# Patient Record
Sex: Female | Born: 1940
Health system: Southern US, Community
[De-identification: ages and names within clinical notes are randomized; demographics above are authoritative.]

## PROBLEM LIST (undated history)

## (undated) DIAGNOSIS — K219 Gastro-esophageal reflux disease without esophagitis: Secondary | ICD-10-CM

## (undated) DIAGNOSIS — R1310 Dysphagia, unspecified: Secondary | ICD-10-CM

## (undated) DIAGNOSIS — I10 Essential (primary) hypertension: Secondary | ICD-10-CM

## (undated) DIAGNOSIS — R7401 Elevation of levels of liver transaminase levels: Secondary | ICD-10-CM

## (undated) DIAGNOSIS — I341 Nonrheumatic mitral (valve) prolapse: Secondary | ICD-10-CM

## (undated) DIAGNOSIS — R740 Nonspecific elevation of levels of transaminase and lactic acid dehydrogenase [LDH]: Secondary | ICD-10-CM

## (undated) HISTORY — DX: Gastro-esophageal reflux disease without esophagitis: K21.9

## (undated) HISTORY — PX: UPPER GASTROINTESTINAL ENDOSCOPY: SHX188

## (undated) HISTORY — PX: COLONOSCOPY: SHX174

## (undated) HISTORY — PX: EYE SURGERY: SHX253

## (undated) HISTORY — DX: Elevation of levels of liver transaminase levels: R74.01

## (undated) HISTORY — DX: Nonrheumatic mitral (valve) prolapse: I34.1

## (undated) HISTORY — DX: Dysphagia, unspecified: R13.10

## (undated) HISTORY — DX: Nonspecific elevation of levels of transaminase and lactic acid dehydrogenase (ldh): R74.0

---

## 1982-10-23 HISTORY — PX: LAPAROSCOPIC TUBAL LIGATION: SHX1937

## 2002-04-30 ENCOUNTER — Emergency Department (HOSPITAL_COMMUNITY): Admission: EM | Admit: 2002-04-30 | Discharge: 2002-04-30 | Payer: Self-pay | Admitting: Internal Medicine

## 2002-04-30 ENCOUNTER — Encounter: Payer: Self-pay | Admitting: Internal Medicine

## 2004-01-21 ENCOUNTER — Ambulatory Visit (HOSPITAL_COMMUNITY): Admission: RE | Admit: 2004-01-21 | Discharge: 2004-01-21 | Payer: Self-pay | Admitting: Pulmonary Disease

## 2004-03-22 ENCOUNTER — Ambulatory Visit (HOSPITAL_COMMUNITY): Admission: RE | Admit: 2004-03-22 | Discharge: 2004-03-22 | Payer: Self-pay | Admitting: Internal Medicine

## 2004-06-15 ENCOUNTER — Ambulatory Visit (HOSPITAL_COMMUNITY): Admission: RE | Admit: 2004-06-15 | Discharge: 2004-06-15 | Payer: Self-pay | Admitting: Pulmonary Disease

## 2004-06-29 ENCOUNTER — Ambulatory Visit (HOSPITAL_COMMUNITY): Admission: RE | Admit: 2004-06-29 | Discharge: 2004-06-29 | Payer: Self-pay | Admitting: Internal Medicine

## 2005-04-05 ENCOUNTER — Ambulatory Visit: Payer: Self-pay | Admitting: *Deleted

## 2005-05-25 ENCOUNTER — Ambulatory Visit: Payer: Self-pay | Admitting: Internal Medicine

## 2006-04-09 ENCOUNTER — Ambulatory Visit: Payer: Self-pay | Admitting: Internal Medicine

## 2006-04-11 ENCOUNTER — Ambulatory Visit (HOSPITAL_COMMUNITY): Admission: RE | Admit: 2006-04-11 | Discharge: 2006-04-11 | Payer: Self-pay | Admitting: Otolaryngology

## 2006-04-18 ENCOUNTER — Ambulatory Visit (HOSPITAL_COMMUNITY): Admission: RE | Admit: 2006-04-18 | Discharge: 2006-04-18 | Payer: Self-pay | Admitting: Otolaryngology

## 2006-04-18 ENCOUNTER — Encounter (INDEPENDENT_AMBULATORY_CARE_PROVIDER_SITE_OTHER): Payer: Self-pay | Admitting: *Deleted

## 2010-03-07 ENCOUNTER — Ambulatory Visit (HOSPITAL_COMMUNITY): Admission: RE | Admit: 2010-03-07 | Discharge: 2010-03-07 | Payer: Self-pay | Admitting: Pulmonary Disease

## 2010-06-09 ENCOUNTER — Ambulatory Visit (HOSPITAL_COMMUNITY): Admission: RE | Admit: 2010-06-09 | Discharge: 2010-06-09 | Payer: Self-pay | Admitting: Pulmonary Disease

## 2010-09-02 ENCOUNTER — Encounter (HOSPITAL_COMMUNITY)
Admission: RE | Admit: 2010-09-02 | Discharge: 2010-10-02 | Payer: Self-pay | Source: Home / Self Care | Attending: Pulmonary Disease | Admitting: Pulmonary Disease

## 2010-09-06 ENCOUNTER — Ambulatory Visit: Payer: Self-pay | Admitting: Internal Medicine

## 2010-10-11 ENCOUNTER — Ambulatory Visit: Payer: Self-pay | Admitting: Internal Medicine

## 2011-03-10 NOTE — Op Note (Signed)
NAMESHINITA, MAC              ACCOUNT NO.:  [id number]   MEDICAL RECORD NO.:  [id number]          PATIENT TYPE:  AMB   LOCATION:  SDS                          FACILITY:  [facility]   PHYSICIAN:  Suzanna Obey, M.D.       DATE OF BIRTH:  1941/07/24   DATE OF PROCEDURE:  04/18/2006  DATE OF DISCHARGE:                                 OPERATIVE REPORT   PREOPERATIVE DIAGNOSIS:  Chronic sinusitis.   POSTOPERATIVE DIAGNOSIS:  Chronic sinusitis.   SURGICAL PROCEDURE:  Bilateral maxillary antrostomy, bilateral  ethmoidectomy, bilateral sphenoidotomy, bilateral frontal sinusotomy and  Stealth computer guidance.   SURGEON:  Suzanna Obey, M.D.   ANESTHESIA:  General.   ESTIMATED BLOOD LOSS:  Approximately 50 mL.   INDICATIONS:  This is a 70 year old who has had chronic sinus problems with  nasal polyposis and has had a significant problem with asthma as well as  sinus disease.  She is completely obstructed on the CT scan.  She was  informed of the risks and benefits of the procedure including bleeding,  infection, scarring, recurrence of the polyps, CSF leak, change in the sense  of smell, blindness, chronic crusting and drying, and risks of the  anesthetic.  All questions are answered and consent was obtained.   DESCRIPTION OF PROCEDURE:  The patient was taken to the operating room and  placed in the supine position and after adequate general endotracheal tube  anesthesia, she was placed in the supine position.  The Stealth guidance  helmet was positioned and calibrated.  Oxymetazoline pledgets were placed  into the nose and the polyps were injected with 1% lidocaine with 1:100,000  epinephrine.  The left side was begun with the microdebrider.  Using Stealth  guidance, the significant polyp material was removed -- it was filling the  nose -- and then dissection was carried into the sinus cavity, where the  antrostomy was identified and opened widely with the upbiting microdebrider.  Stealth guidance was used.  The dissection was carried from posterior to  anterior and the polyps were very soft, so no bone was removed to be  removed.  It was carefully dissected along the fovea up into the nasofrontal  duct, where thick mucus was suctioned out of the frontal sinus.  Once  debriding all this soft polyp material out, were nicely open sinuses.  The  oxymetazoline pledgets were placed.  The right side was repeated in the same  fashion.  Again, antrostomy opened, thick polypoid material within it and  polyps throughout the entire sinus cavities.  Again, thick mucus was in the  frontal sinus, which was opened.  Stealth guidance was used throughout.  The  blood was suctioned out and oxymetazoline pledgets were placed.  The Kennedy  packs soaked in Bacitracin  were then placed into the nose and injected with saline.  These were loosely  tied across the columella.  The nasopharynx was suctioned out of all blood  and debris.  The patient was awakened and brought to Recovery in stable  condition.  Counts correct.  ______________________________  Suzanna Obey, M.D.     JB/MEDQ  D:  04/18/2006  T:  04/18/2006  Job:  [id number]   cc:   Ramon Dredge L. Juanetta Gosling, M.D.  Fax: [phone]

## 2011-03-10 NOTE — Op Note (Signed)
NAME:  DESMA, WILKOWSKI                        ACCOUNT NO.:  [id number]   MEDICAL RECORD NO.:  [id number]                   PATIENT TYPE:  AMB   LOCATION:  DAY                                  FACILITY:  [facility]   PHYSICIAN:  Lionel December, M.D.                 DATE OF BIRTH:  08/29/1941   DATE OF PROCEDURE:  06/29/2004  DATE OF DISCHARGE:                                 OPERATIVE REPORT   PROCEDURE:  Total colonoscopy.   INDICATIONS:  Thecla is a 70 year old Caucasian female who is here primarily  for screening exam. This is prompted by change in bowel habits, her  constipation resulting from verapamil which has been discontinued, and she  is back to having bowel movement daily or every other day.  Family history  is negative for colorectal carcinoma. Procedure and risks were reviewed with  the patient and informed consent was obtained.   PREOPERATIVE MEDICATIONS:  Demerol 50 mg IV, Versed 6 mg IV in divided  doses.   FINDINGS:  Procedure performed in endoscopy suite. The patient's vital signs  and O2 saturations were monitored during procedure and remained stable. The  patient was placed in the left lateral position and rectal examination  performed. No abnormality noted on external or digital exam. Olympus video  scope was placed in the rectum and advanced into sigmoid colon and beyond.  She had few scattered diverticula, mainly at sigmoid colon and few more  proximal to that. She had one small diverticulum at the cecum. Preparation  was satisfactory although she had a lot of air bubbles and required vigorous  washing. Scope was advanced to the cecum.  Cecal landmarks, i.e. appendiceal orifice and ileocecal valve, were  photographed for the record. As the scope was withdrawn, colonic mucosa was  once again carefully examined and was normal throughout. There were no  polyps and/or tumor masses. The rectal mucosa similarly was normal. Scope  was retroflexed to examine anorectal  junction, and small hemorrhoids were  noted below the dentate line. She also had fine mucosal pigmentation to  proximal colon consistent with melanosis coli. The scope was withdrawn. The  patient tolerated the procedure well.   FINAL DIAGNOSES:  1.  Pan colic diverticulosis. She has a few scattered diverticula mainly at      the sigmoid colon and some proximal to splenic flexure.  2.  Small external hemorrhoids.  3.  Mild melanosis coli.   RECOMMENDATIONS:  High-fiber diet plus Citrucel 1 tablespoonful daily.      ___________________________________________                                            Lionel December, M.D.   NR/MEDQ  D:  06/29/2004  T:  06/29/2004  Job:  [id number]   cc:   Ramon Dredge  Emilie Rutter, M.D.  [place]  [place] [address]  Fax: [phone]

## 2011-03-10 NOTE — Consult Note (Signed)
NAME:  Stephanie West, Stephanie West                        ACCOUNT NO.:  [id number]   MEDICAL RECORD NO.:  [id number]                  PATIENT TYPE:   LOCATION:                                       FACILITY:   PHYSICIAN:  Lionel December, M.D.                 DATE OF BIRTH:  1940/11/08   DATE OF CONSULTATION:  03/16/2004  DATE OF DISCHARGE:                                   CONSULTATION   REASON FOR CONSULTATION:  Dysphagia.   HISTORY OF PRESENT ILLNESS:  Stephanie West is a 70 year old Caucasian female who is  referred through the courtesy of Dr. Juanetta Gosling for further evaluation of  dysphagia, which has been going on for four years.  The patient says that  she was busy helping her mother with Alzheimer's dementia and therefore  could not come any earlier.  She has intermittent episodes.  She does not  have any difficulty with liquids.  She feels that food gets stuck at  midsternal level.  She has had multiple episodes where she had to  regurgitate in order to get relief.  She has been on Fosamax for  osteoporosis.  She stopped this two months ago.  She has also been on  Aciphex and she has noted some improvement, or at least has been suffering  less frequently.  She says that she chews the food thoroughly.  She denies  chest pain, odynophagia, heartburn, hoarseness, chronic cough or sore  throat.  She also denies abdominal pain, melena or rectal bleeding.  Her  bowels move daily.  Her weight has been stable and she has very good  appetite.  She tells me that Dr. Juanetta Gosling and her husband have asked her to  undergo colonoscopy, but she has been nervous, but she wants to have it this  summer.   Stephanie West had EGD and EG back in September 1988 when she presented with  epigastric pain, dysphagia as well as chest discomfort.  She had mild  changes of reflux esophagitis, but no ring or stricture was noted.  Her  esophagus was empirically dilated at that time with relief of her dysphagia.   She had a second EGD in  March 1993 when she was hospitalized with nausea and  vomiting of five weeks duration and had a 14 pound weight loss.  This EGD  was normal.  She had further evaluation while hospitalized, but I do not  have those records available.   She is on:  1. Advair Discus 500/50 b.i.d.  2. Theo-Dur 200 mg q.d.  3. Aciphex 20 mg b.i.d.  4. Xanax 1 mg q.h.s.  5. Kay Ciel 10 mEq b.i.d.  6. Nasonex spray q.d.  7. Albuterol inhaler p.r.n.  8. Fosamax was discontinued about eight weeks ago.   PAST MEDICAL HISTORY:  1. She has had asthma since age 35.  She has not had any problems since she     has been on  Advair Discus.  She does not even remember the last time she     used her albuterol inhaler.  2. She has been hypertensive for 20 years.  3. She has osteoporosis.  4. She has had three sinus surgeries.  5. She has had bilateral tubal ligation.  6. Her prior EGDs are reviewed, as above.   ALLERGIES:  PENICILLIN, SULFA and ASA; all causing asthmatic attack.   FAMILY HISTORY:  Mother died of Alzheimer's dementia at age 61; father of  prostate cancer at age 31; she does not have any siblings.   SOCIAL HISTORY:  She is married and she has four healthy children.  She is a  retired Hospital doctor.  She has never smoked cigarettes and does  not drink alcohol.   PHYSICAL EXAMINATION:  GENERAL:  This is a pleasant, well-developed, well-  nourished Caucasian female who is in no acute distress.  VITAL SIGNS:  She weighs 156 pounds; she is 5 feet 4 inches tall; pulse 82;  blood pressure 164/80.  HEENT:  Conjunctivae is pink.  Sclerae is nonicteric.  Oropharyngeal mucosa  is normal.  Dentition in satisfactory condition.  NECK:  Without masses or thyromegaly.  CARDIAC:  Regular rhythm.  Normal S1 and S3.  No murmur or gallop noted.  LUNGS:  Clear to auscultation.  ABDOMEN:  All bowel sounds are normal.  Palpation reveals soft abdomen  without tenderness, organomegaly or masses.  RECTAL:   Deferred.  EXTREMITIES:  She does not have clubbing or peripheral edema.   I also reviewed her laboratory studies from January 21, 2004.  Her CBC is  normal.  Her LFTs are also within normal limits.   ASSESSMENT:  Stephanie West is a 78 year old Caucasian female who presents with a  four year history of intermittent solid food dysphagia, which had been  gradually getting worse until she went on proton pump inhibitor.  She does  not have typical symptoms for gastroesophageal reflux disease.  She has been  on Fosamax.  It is very likely that she has developed an esophageal  stricture secondary to this therapy.  She needs to be further evaluated with  esophagogastroduodenoscopy and will possibly need EG.   RECOMMENDATIONS:  1. She will continue antireflux measures as before, but drop Aciphex dose to     20 mg p.o. q.a.m.  2. Esophagogastroduodenoscopy and possible esophageal dilatation to be     performed at [facility] in the near future.  I     have reviewed the procedure and risks with the patient and she is     agreeable.  3. She will be scheduled for screening colonoscopy some time in July or     August.   We would like to thank Dr. Juanetta Gosling for the opportunity to participate in the  care of this nice lady.      ___________________________________________                                            Lionel December, M.D.   NR/MEDQ  D:  03/16/2004  T:  03/17/2004  Job:  [id number]   cc:   Ramon Dredge L. Juanetta Gosling, M.D.  [address]  [place]  [place] [address]  Fax: [phone]

## 2011-03-10 NOTE — Op Note (Signed)
NAME:  Stephanie West, Stephanie West                        ACCOUNT NO.:  [id number]   MEDICAL RECORD NO.:  [id number]                   PATIENT TYPE:  AMB   LOCATION:  DAY                                  FACILITY:  [facility]   PHYSICIAN:  Lionel December, M.D.                 DATE OF BIRTH:  03-Jun-1941   DATE OF PROCEDURE:  03/22/2004  DATE OF DISCHARGE:                                 OPERATIVE REPORT   PROCEDURE:  Esophagogastroduodenoscopy with esophageal dilatation.   INDICATIONS:  Jajaira is a 70 year old Caucasian female who presents with  solid food dysphagia the patient has been experiencing intermittently over  the last four years.  She was on Fosamax, which was discontinued just over a  month ago.  She did have her esophagus dilated back in 1998, but she did not  have any ring or stricture in it.  It seemed to help her dysphagia.  She is  undergoing diagnostic and therapeutic procedure.  The procedure risks were  reviewed with the patient.  Informed consent was obtained.   PREMEDICATION:  Cetacaine spray for pharyngeal topical anesthesia, Demerol  50 mg IV, Versed 6 mg IV in divided dose.   FINDINGS:  Procedure performed in endoscopy suite.  Patient's vital signs  and O2 saturation were monitored during procedure and remained stable.  The  patient was placed in the left lateral recumbent position and the Olympus  video scope was passed via oropharynx into the laryngopharynx.  Some  difficulty encountered in passing the scope into esophagus, felt to be due  to esophageal web.   Esophagus:  Mucosa and body of the esophagus normal.  There was a Schatzki's  ring at GE junction, which was located at 34 cm.  Hiatus was at 39.  She was  felt to have a small to moderate-sized sliding hiatal hernia.   Stomach:  It was empty and distended very well with insufflation.  Folds of  the proximal stomach were normal.  Examination of the mucosa revealed linear  streaks of erythema at antrum and there was a  3 mm polyp at the fundus,  which was ablated via cold biopsy.   Duodenum:  Examination of the bulb and second part of the duodenum was  normal.  The endoscope was withdrawn.   The esophagus was dilated by passing a 54 [place] Maloney dilator to full  insertion.  As the dilator was withdrawn, endoscope was passed again and  there was a __________ tear of the cervical esophagus, which was felt to be  the site of web, and another mucosal tear at GE junction, disrupting the  ring.  The endoscope was withdrawn.  The endoscope was withdrawn.  The  patient tolerated the procedure well.   FINAL DIAGNOSES:  1. Esophageal web and Schatzki's ring, both of which were disrupted by     passing 54 [place] Maloney dilator.  2. Small to moderate-sized sliding hiatal  hernia.  3. Nonerosive antral gastritis.  4. A 3 mm fundal polyp that was ablated by cold biopsy.   RECOMMENDATIONS:  1. She will continue Aciphex at 20 mg p.o. q.a.m.  2. She will call with a progress report later this week.  3. I would like for her to hold off her Fosamax until I have talked with her     later this week or next.      ___________________________________________                                            Lionel December, M.D.   NR/MEDQ  D:  03/22/2004  T:  03/22/2004  Job:  [id number]   cc:   Ramon Dredge L. Juanetta Gosling, M.D.  [address]  [place]  [place] [address]  Fax: [phone]

## 2011-03-14 ENCOUNTER — Ambulatory Visit (HOSPITAL_COMMUNITY)
Admission: RE | Admit: 2011-03-14 | Discharge: 2011-03-14 | Disposition: A | Discharge status: Home / Self Care | Payer: Medicare Other | Source: Ambulatory Visit | Attending: Pulmonary Disease | Admitting: Pulmonary Disease

## 2011-03-14 ENCOUNTER — Encounter (HOSPITAL_COMMUNITY): Payer: Self-pay | Admitting: Radiology

## 2011-03-14 ENCOUNTER — Inpatient Hospital Stay (HOSPITAL_COMMUNITY)
Admission: RE | Admit: 2011-03-14 | Discharge: 2011-03-20 | DRG: 195 | Disposition: A | Discharge status: Home / Self Care | Payer: Medicare Other | Source: Ambulatory Visit | Attending: Pulmonary Disease | Admitting: Pulmonary Disease

## 2011-03-14 ENCOUNTER — Inpatient Hospital Stay (HOSPITAL_COMMUNITY): Payer: Medicare Other

## 2011-03-14 ENCOUNTER — Other Ambulatory Visit (HOSPITAL_COMMUNITY): Payer: Self-pay | Admitting: Pulmonary Disease

## 2011-03-14 DIAGNOSIS — Z880 Allergy status to penicillin: Secondary | ICD-10-CM

## 2011-03-14 DIAGNOSIS — J4489 Other specified chronic obstructive pulmonary disease: Secondary | ICD-10-CM | POA: Diagnosis present

## 2011-03-14 DIAGNOSIS — I10 Essential (primary) hypertension: Secondary | ICD-10-CM | POA: Diagnosis present

## 2011-03-14 DIAGNOSIS — R059 Cough, unspecified: Secondary | ICD-10-CM | POA: Insufficient documentation

## 2011-03-14 DIAGNOSIS — R0789 Other chest pain: Secondary | ICD-10-CM | POA: Insufficient documentation

## 2011-03-14 DIAGNOSIS — M810 Age-related osteoporosis without current pathological fracture: Secondary | ICD-10-CM | POA: Diagnosis present

## 2011-03-14 DIAGNOSIS — R05 Cough: Secondary | ICD-10-CM

## 2011-03-14 DIAGNOSIS — F3289 Other specified depressive episodes: Secondary | ICD-10-CM | POA: Diagnosis present

## 2011-03-14 DIAGNOSIS — J329 Chronic sinusitis, unspecified: Secondary | ICD-10-CM | POA: Diagnosis present

## 2011-03-14 DIAGNOSIS — R918 Other nonspecific abnormal finding of lung field: Secondary | ICD-10-CM | POA: Insufficient documentation

## 2011-03-14 DIAGNOSIS — J449 Chronic obstructive pulmonary disease, unspecified: Secondary | ICD-10-CM | POA: Diagnosis present

## 2011-03-14 DIAGNOSIS — K219 Gastro-esophageal reflux disease without esophagitis: Secondary | ICD-10-CM | POA: Diagnosis present

## 2011-03-14 DIAGNOSIS — F411 Generalized anxiety disorder: Secondary | ICD-10-CM | POA: Diagnosis present

## 2011-03-14 DIAGNOSIS — R0602 Shortness of breath: Secondary | ICD-10-CM

## 2011-03-14 DIAGNOSIS — J189 Pneumonia, unspecified organism: Principal | ICD-10-CM | POA: Diagnosis present

## 2011-03-14 DIAGNOSIS — F329 Major depressive disorder, single episode, unspecified: Secondary | ICD-10-CM | POA: Diagnosis present

## 2011-03-14 HISTORY — DX: Essential (primary) hypertension: I10

## 2011-03-14 LAB — COMPREHENSIVE METABOLIC PANEL
ALT: 19 U/L (ref 0–35)
Albumin: 3.1 g/dL — ABNORMAL LOW (ref 3.5–5.2)
GFR calc non Af Amer: 57 mL/min — ABNORMAL LOW (ref >60)

## 2011-03-14 LAB — CBC
HCT: 38.6 % (ref 36.0–46.0)
MCH: 33.2 pg (ref 26.0–34.0)
MCV: 97.2 fL (ref 78.0–100.0)
Platelets: 179 10*3/uL (ref 150–400)
RDW: 12.3 % (ref 11.5–15.5)

## 2011-03-14 LAB — INFLUENZA PANEL BY PCR (TYPE A & B): H1N1 flu by pcr: NOT DETECTED

## 2011-03-14 LAB — DIFFERENTIAL
Basophils Relative: 0 % (ref 0–1)
Eosinophils Relative: 0 % (ref 0–5)
Lymphocytes Relative: 13 % (ref 12–46)
Lymphs Abs: 2.6 10*3/uL (ref 0.7–4.0)
Monocytes Absolute: 1.4 10*3/uL — ABNORMAL HIGH (ref 0.1–1.0)
Neutro Abs: 16 10*3/uL — ABNORMAL HIGH (ref 1.7–7.7)

## 2011-03-14 LAB — D-DIMER, QUANTITATIVE: D-Dimer, Quant: 1.12 ug/mL-FEU — ABNORMAL HIGH (ref 0.00–0.48)

## 2011-03-14 MED ORDER — IOHEXOL 350 MG/ML SOLN
100.0000 mL | Freq: Once | INTRAVENOUS | Status: AC | PRN
  Administered 2011-03-14: 100 mL via INTRAVENOUS

## 2011-03-15 LAB — BASIC METABOLIC PANEL
Chloride: 102 mEq/L (ref 96–112)
Creatinine, Ser: 0.81 mg/dL (ref 0.4–1.2)
GFR calc Af Amer: >60 mL/min (ref >60)
Glucose, Bld: 81 mg/dL (ref 70–99)
Potassium: 3.6 mEq/L (ref 3.5–5.1)
Sodium: 136 mEq/L (ref 135–145)

## 2011-03-15 LAB — URINE MICROSCOPIC-ADD ON

## 2011-03-15 LAB — URINALYSIS, ROUTINE W REFLEX MICROSCOPIC
Leukocytes, UA: NEGATIVE
Nitrite: POSITIVE — AB
Urobilinogen, UA: 0.2 mg/dL (ref 0.0–1.0)
pH: 5.5 (ref 5.0–8.0)

## 2011-03-15 LAB — DIFFERENTIAL
Basophils Relative: 0 % (ref 0–1)
Eosinophils Absolute: 0.1 10*3/uL (ref 0.0–0.7)
Eosinophils Relative: 1 % (ref 0–5)
Lymphs Abs: 2.5 10*3/uL (ref 0.7–4.0)
Monocytes Absolute: 1 10*3/uL (ref 0.1–1.0)
Monocytes Relative: 8 % (ref 3–12)
Neutro Abs: 9.6 10*3/uL — ABNORMAL HIGH (ref 1.7–7.7)
Neutrophils Relative %: 72 % (ref 43–77)

## 2011-03-15 LAB — CBC
HCT: 36.6 % (ref 36.0–46.0)
MCH: 32.3 pg (ref 26.0–34.0)
MCV: 98.7 fL (ref 78.0–100.0)
RBC: 3.71 MIL/uL — ABNORMAL LOW (ref 3.87–5.11)
WBC: 13.2 10*3/uL — ABNORMAL HIGH (ref 4.0–10.5)

## 2011-03-15 LAB — GENTAMICIN LEVEL, RANDOM: Gentamicin Rm: 5.7 ug/mL

## 2011-03-16 LAB — URINE CULTURE: Culture  Setup Time: 201205231045

## 2011-03-16 NOTE — Group Therapy Note (Signed)
  Stephanie West, SCHLACHTER              ACCOUNT NO.:  [id number]  MEDICAL RECORD NO.:  [id number]           PATIENT TYPE:  I  LOCATION:  A308                          FACILITY:  [facility]  PHYSICIAN:  Emmalea Treanor L. Juanetta Gosling, M.D.DATE OF BIRTH:  [birth year]  DATE OF PROCEDURE:  03/15/2011 DATE OF DISCHARGE:                                PROGRESS NOTE   Stephanie West is a 70 year old who was admitted with pneumonia.  This is a community-acquired pneumonia and she is being treated for that.  She says she feels okay and has no new complaints except she did not get her AcipHex last night, actually got her on Protonix and she did not get her blood pressure medication last night.  She is feeling a little bit better.  She is less congested.  She is a little more able to take a deep breath.  Her exam shows that her temperature is 98.1, pulse is 90, respirations 20, blood pressure 145/81, and O2 sats 88% on room air. Her chest is clear without wheezes.  Her heart is regular.  Her abdomen is soft.  Her extremities showed no edema.  White blood count shows 13,200 which is down substantially.  She had a CT of the chest done because of an elevated D-dimer and it did not show pulmonary emboli.  My assessment then she is improving.  Plan is to continue her with current treatments, medications, IV antibiotics, etc.  We will get the med rec and get her started back on her blood pressure medication, get her on the Protonix, continue with everything else and follow.     Abdalla Naramore L. Juanetta Gosling, M.D.     ELH/MEDQ  D:  03/15/2011  T:  03/15/2011  Job:  [id number]  Electronically Signed by Kari Baars M.D. on 03/16/2011 08:43:57 AM

## 2011-03-16 NOTE — H&P (Signed)
  NAMEMADY, Stephanie West              ACCOUNT NO.:  [id number]  MEDICAL RECORD NO.:  [id number]           PATIENT TYPE:  I  LOCATION:  A308                          FACILITY:  [facility]  PHYSICIAN:  Jonika Critz L. Juanetta Gosling, M.D.DATE OF BIRTH:  1941/07/21  DATE OF ADMISSION:  03/14/2011 DATE OF DISCHARGE:  LH                             HISTORY & PHYSICAL   REASON FOR ADMISSION:  Pneumonia.  HISTORY:  Stephanie West is a 70 year old who came to my office complaining of chest discomfort, shortness of breath, and hemoptysis. She had fever as well.  I had her get a chest x-ray and this was able to found that she had pneumonia and unfortunately has severe problems with asthma and she is very short of breath, and not able to take a deep breath while cough anything up.  She is going to be admitted to the hospital.  Her past medical history is positive for GERD, asthma, chronic sinus infection, and she had some problems with elevated liver function testand this has improved.  She has hyperlipidemia, anxiety, depression. Her other history shows asthma, she had sinus surgery.  She has had a tubal ligation.  She has had osteoporosis.  FAMILY HISTORY:  Shows that her father died of prostate cancer.  Mother had dementia.  SOCIAL HISTORY:  She is married, lives at home with her husband.  She does not smoke.  She does not use any alcohol.  She has been volunteering at [employer] which is of some importance because of an outbreak of influenza there.  REVIEW OF SYSTEMS:  Except as mentioned is negative.  PHYSICAL EXAMINATION:  GENERAL:  She appears to be uncomfortable. VITAL SIGNS:  Her temperature is 101.1. HEENT:  Her pupils are reactive.  Nose and throat are clear.  Mucous membranes are moist. NECK:  Supple without masses. CHEST:  Relatively clear with some rhonchi, but she is splinting on the right side. HEART:  Regular without murmur, gallop, or rub. ABDOMEN:  Soft with no masses.   Bowel sounds are present and active. EXTREMITIES:  Showed no edema. CENTRAL NERVOUS SYSTEM:  Grossly intact.  Assessment is that she has a pneumonia by chest x-ray clinically that fits her picture.  It is possible that she had some sort of problem something like influenza as well, but at this point, I am going to check a influenza PCR, continue with everything else, continue all of her other treatments and have her get lab work, blood cultures, etc.  She also thinks she may have a urinary tract infection, so I am going to have her get a urine culture as well.     Hiroto Saltzman L. Juanetta Gosling, M.D.     ELH/MEDQ  D:  03/14/2011  T:  03/15/2011  Job:  [id number]  Electronically Signed by Kari Baars M.D. on 03/16/2011 08:43:54 AM

## 2011-03-17 NOTE — Group Therapy Note (Signed)
  NAMEMURDIS, [name]              ACCOUNT NO.:  [id number]  MEDICAL RECORD NO.:  [id number]           PATIENT TYPE:  I  LOCATION:  A308                          FACILITY:  [facility]  PHYSICIAN:  Jayda White L. Juanetta Gosling, M.D.DATE OF BIRTH:  October 09, 1941  DATE OF PROCEDURE: DATE OF DISCHARGE:                                PROGRESS NOTE   [name] is admitted with pneumonia.  She also has asthma/COPD, anxiety, depression, and hypertension.  She is much improved this morning, says she still has some pain in the right side of her chest. Her exam shows that her temperature is 98.2, pulse 88, respirations 18, blood pressure 122/76, O2 sats 93% on room air.  Her chest is clear. She looks much better.  Her heart is regular.  Blood cultures thus far are negative and my assessment is that she is improving.  Plan is to continue with current treatments IV antibiotics etc.  We discussed what she wants to do depending on how she improves.  She is on antibiotics that do not have a good equivalent p.o. and I wanted to get at least 5 days IV.  She is not particularly interested in doing a PICC line and IV antibiotics at home.  We will plan to continue with treatments, get at least 5 days IV in and then decide what to do from there depending on her clinical situation.     Bright Spielmann L. Juanetta Gosling, M.D.     ELH/MEDQ  D:  03/16/2011  T:  03/16/2011  Job:  [id number]  Electronically Signed by Kari Baars M.D. on 03/17/2011 03:44:19 PM

## 2011-03-18 LAB — BASIC METABOLIC PANEL
BUN: 6 mg/dL (ref 6–23)
CO2: 27 mEq/L (ref 19–32)
Calcium: 9 mg/dL (ref 8.4–10.5)
Creatinine, Ser: 0.53 mg/dL (ref 0.4–1.2)
GFR calc non Af Amer: >60 mL/min (ref >60)
Potassium: 3.7 mEq/L (ref 3.5–5.1)

## 2011-03-18 LAB — DIFFERENTIAL
Basophils Absolute: 0 10*3/uL (ref 0.0–0.1)
Lymphocytes Relative: 18 % (ref 12–46)
Neutro Abs: 4.2 10*3/uL (ref 1.7–7.7)

## 2011-03-18 LAB — CBC
HCT: 33.7 % — ABNORMAL LOW (ref 36.0–46.0)
MCH: 32.4 pg (ref 26.0–34.0)
MCHC: 33.8 g/dL (ref 30.0–36.0)
RBC: 3.52 MIL/uL — ABNORMAL LOW (ref 3.87–5.11)

## 2011-03-19 LAB — CULTURE, BLOOD (ROUTINE X 2)
Culture: NO GROWTH
Culture: NO GROWTH

## 2011-03-22 NOTE — Group Therapy Note (Signed)
  Stephanie West, Stephanie West              ACCOUNT NO.:  [id number]  MEDICAL RECORD NO.:  [id number]           PATIENT TYPE:  I  LOCATION:  A308                          FACILITY:  [facility]  PHYSICIAN:  Yetunde Leis L. Juanetta Gosling, M.D.DATE OF BIRTH:  Mar 15, 1941  DATE OF PROCEDURE: DATE OF DISCHARGE:                                PROGRESS NOTE   [name] is admitted with pneumonia.  She has asthma/COPD and she says she is doing better.  She has no new complaints.  She is still weak and she did have some wheezing last night.  PHYSICAL EXAMINATION:  GENERAL:  Shows that she is awake and alert. CHEST:  Clear. HEART:  Regular. ABDOMEN:  Soft.  Assessment is that she has pneumonia.  She has asthma which is pretty stable.  She has hypertension.  PLAN:  Because she has had some wheezes, I am going to start her on some Solu-Medrol now, continue with her antibiotics and follow.     Isiac Breighner L. Juanetta Gosling, M.D.     ELH/MEDQ  D:  03/17/2011  T:  03/18/2011  Job:  [id number]  Electronically Signed by Kari Baars M.D. on 03/22/2011 08:26:52 AM

## 2011-03-22 NOTE — Group Therapy Note (Signed)
  Stephanie West, GARTLEY              ACCOUNT NO.:  [id number]  MEDICAL RECORD NO.:  [id number]           PATIENT TYPE:  I  LOCATION:  A308                          FACILITY:  [facility]  PHYSICIAN:  Aalyssa Elderkin L. Juanetta Gosling, M.D.DATE OF BIRTH:  11-Jun-1941  DATE OF PROCEDURE:  03/20/2011 DATE OF DISCHARGE:  03/20/2011                                PROGRESS NOTE   Ms. Mount is feeling much better.  She has no new complaints.  She was admitted with pneumonia, has asthma/COPD and hypertension.  Her exam today shows that her temperature is 97.7, pulse 81, respirations 16, blood pressure 154/79, and O2 sats 92% on room air.  Her chest is much clearer.  Her heart is regular.  Her abdomen is soft and overall she is much better.  ASSESSMENT:  She is improved.  PLAN:  I am going to discharge her home today.  Please see discharge summary for details.     Nannette Zill L. Juanetta Gosling, M.D.     ELH/MEDQ  D:  03/20/2011  T:  03/20/2011  Job:  [id number] Electronically Signed by Kari Baars M.D. on 03/22/2011 08:26:57 AM

## 2011-03-22 NOTE — Group Therapy Note (Signed)
  NAMEDESARAI, BARRACK              ACCOUNT NO.:  [id number]  MEDICAL RECORD NO.:  [id number]           PATIENT TYPE:  I  LOCATION:  A308                          FACILITY:  [facility]  PHYSICIAN:  Sarim Rothman L. Juanetta Gosling, M.D.DATE OF BIRTH:  12-Sep-1941  DATE OF PROCEDURE: DATE OF DISCHARGE:                                PROGRESS NOTE   Ms. Scholler said that she had a much better night last night and feels much better.  She has no new complaints.  Her exam shows her temperature is 97.6, pulse 70, respirations 20, blood pressure 137/79, O2 sats 91% on room air.  Her chest is much clearer. She looks more comfortable.  ASSESSMENT:  I think she is improving.  She is going to stay in the hospital today and finish up IV antibiotics tomorrow.  If she is okay, I will let her go home at that point.     Bartolo Montanye L. Juanetta Gosling, M.D.     ELH/MEDQ  D:  03/19/2011  T:  03/19/2011  Job:  [id number]  Electronically Signed by Kari Baars M.D. on 03/22/2011 08:26:55 AM

## 2011-03-22 NOTE — Group Therapy Note (Signed)
  [name], West              ACCOUNT NO.:  [id number]  MEDICAL RECORD NO.:  [id number]           PATIENT TYPE:  LOCATION:                                 FACILITY:  PHYSICIAN:  Iviana Blasingame L. Juanetta Gosling, M.D.DATE OF BIRTH:  [birth year]  DATE OF PROCEDURE: DATE OF DISCHARGE:                                PROGRESS NOTE   Stephanie West says she is doing okay.  She has no new complaints, but is concerned because her blood pressure went up, I think, probably because I put her on steroids.  She says that her chest is improving, but she still feels like she has got significant congestion.  PHYSICAL EXAMINATION THIS MORNING:  VITAL SIGNS:  Temperature 97.7, pulse 95, respirations 20, blood pressure 165/89, O2 sats 95% on room air. CHEST:  Clearer. HEART:  Regular. GENERAL:  She looks better.  Her BMET shows a glucose of 144, otherwise normal and her CBC shows white blood count is 5200, hemoglobin is 11.4, platelets 230.  ASSESSMENT:  She is improving.  PLAN:  Continue with her current treatments and medications.  No changes today.  I will continue to follow her.  She may be able to be discharged home in the next 24-48 hours.     Adria Costley L. Juanetta Gosling, M.D.     ELH/MEDQ  D:  03/18/2011  T:  03/18/2011  Job:  [id number]  Electronically Signed by Kari Baars M.D. on 03/22/2011 08:27:00 AM

## 2011-03-22 NOTE — Discharge Summary (Signed)
Stephanie, West              ACCOUNT NO.:  [id number]  MEDICAL RECORD NO.:  [id number]           PATIENT TYPE:  I  LOCATION:  A308                          FACILITY:  [facility]  PHYSICIAN:  Omnia Dollinger L. Juanetta Gosling, M.D.DATE OF BIRTH:  November 14, 1940  DATE OF ADMISSION:  03/14/2011 DATE OF DISCHARGE:  05/28/2012LH                              DISCHARGE SUMMARY   FINAL DISCHARGE DIAGNOSES: 1. Pneumonia. 2. Asthma/chronic obstructive pulmonary disease. 3. Gastroesophageal reflux disease. 4. Chronic sinusitis. 5. Recent episode of elevated liver function testing. 6. Hyperlipidemia. 7. Hypertension. 8. Anxiety. 9. Depression. 10.History of tubal ligation. 11.Osteoporosis.  HISTORY:  [name] is a 70 year old who came to my office on the day of admission complaining of chest discomfort, shortness of breath, and hemoptysis.  She had fever and I had her get a chest x-ray which showed that she has pneumonia.  Because of her severe asthma in the fact she was unable to take a deep breath she was admitted to the hospital.  Exam showed that she appeared to be very uncomfortable, temperature was 101.1.  Her chest showed some rhonchi splinting on the right side, her chest x-ray showed pneumonia.  I had to influenza PCR which was negative.  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  HOSPITAL COURSE:  Because she has multiple drug allergies including all of the routine medications that are recommended for community-acquired pneumonia.  She was started on gentamicin and Azactam.  I am aware of course of these were not typical drugs for community-acquired pneumonia but she is allergic to PENICILLIN.  She is allergic to AVELOX.  She can take Cipro, but she is allergic to [facility].  She does not tolerate AZITHROMYCIN and has multiple other drug intolerances.  She improved over the next several days.  I did start on some steroids  because she had a lot of wheezing and she was improved.  She is discharged home on the 28th and she will be discharged home on the following medications; 1. Prednisone 40 mg tapering to 0. 2. WelChol 625 mg 2 tablets 3 times a day. 3. Diltiazem extended release 180 mg daily. 4. Tenex 1 mg at bedtime. 5. Muro-128 to both eyes 2 drops b.i.d. 6. Tekturna 150 mg at bedtime. 7. Multiple vitamins daily. 8. AcipHex 20 mg daily. 9. Potassium chloride 10 mEq daily. 10.Advair Diskus 250/50 one puff b.i.d. 11.Vitamin D3 400 units daily. 12.She is going to use an incentive spirometer at home. 13.She has a nebulizer at home and has albuterol and she will continue     to use that as needed .  By the time of discharge her chest was much clearer.  She could take deep breaths without pain and she was afebrile.  In addition to the prednisone 40 mg x3 days, then 30 mg x3 days, then 20 mg x3 days, then 10 mg x3 days, and then stop.  She will be on Cipro 500 mg b.i.d.  She has an appointment to see me in my office on the 30th and I have encouraged her to  keep that appointment.     Jasmane Brockway L. Juanetta Gosling, M.D.     ELH/MEDQ  D:  03/20/2011  T:  03/20/2011  Job:  [id number]  Electronically Signed by Kari Baars M.D. on 03/22/2011 08:26:49 AM

## 2011-04-19 ENCOUNTER — Other Ambulatory Visit (HOSPITAL_COMMUNITY): Payer: Self-pay | Admitting: Pulmonary Disease

## 2011-04-19 ENCOUNTER — Ambulatory Visit (HOSPITAL_COMMUNITY)
Admission: RE | Admit: 2011-04-19 | Discharge: 2011-04-19 | Disposition: A | Discharge status: Home / Self Care | Payer: Medicare Other | Source: Ambulatory Visit | Attending: Pulmonary Disease | Admitting: Pulmonary Disease

## 2011-04-19 DIAGNOSIS — Z09 Encounter for follow-up examination after completed treatment for conditions other than malignant neoplasm: Secondary | ICD-10-CM | POA: Insufficient documentation

## 2011-04-19 DIAGNOSIS — J189 Pneumonia, unspecified organism: Secondary | ICD-10-CM

## 2011-05-23 ENCOUNTER — Telehealth (INDEPENDENT_AMBULATORY_CARE_PROVIDER_SITE_OTHER): Payer: Self-pay | Admitting: *Deleted

## 2011-05-23 NOTE — Telephone Encounter (Signed)
Per Dr. Karilyn Cota the patient will need to have LFTS in 3 months.

## 2011-07-27 ENCOUNTER — Telehealth (INDEPENDENT_AMBULATORY_CARE_PROVIDER_SITE_OTHER): Payer: Self-pay | Admitting: *Deleted

## 2011-07-27 NOTE — Telephone Encounter (Signed)
Letter sent to the patient as a reminder for lab work to be done.

## 2011-08-04 ENCOUNTER — Other Ambulatory Visit (INDEPENDENT_AMBULATORY_CARE_PROVIDER_SITE_OTHER): Payer: Self-pay | Admitting: Internal Medicine

## 2011-08-05 LAB — HEPATIC FUNCTION PANEL
ALT: 84 U/L — ABNORMAL HIGH (ref 0–35)
Albumin: 4.2 g/dL (ref 3.5–5.2)
Alkaline Phosphatase: 86 U/L (ref 39–117)
Indirect Bilirubin: 0.4 mg/dL (ref 0.0–0.9)
Total Protein: 7.4 g/dL (ref 6.0–8.3)

## 2011-08-08 ENCOUNTER — Ambulatory Visit (INDEPENDENT_AMBULATORY_CARE_PROVIDER_SITE_OTHER): Payer: Medicare Other | Admitting: Internal Medicine

## 2011-08-08 ENCOUNTER — Encounter (INDEPENDENT_AMBULATORY_CARE_PROVIDER_SITE_OTHER): Payer: Self-pay | Admitting: Internal Medicine

## 2011-08-08 VITALS — BP 130/80 | HR 76 | Temp 97.8°F | Resp 14 | Ht 63.0 in | Wt 142.0 lb

## 2011-08-08 DIAGNOSIS — K219 Gastro-esophageal reflux disease without esophagitis: Secondary | ICD-10-CM

## 2011-08-08 MED ORDER — OMEPRAZOLE-SODIUM BICARBONATE 40-1100 MG PO CAPS: 1.0000 | ORAL_CAPSULE | Freq: Every day | ORAL | Status: AC

## 2011-08-08 NOTE — Patient Instructions (Signed)
Discontinue AcipHex zegrid a great 40 mg by mouth daily 30 minutes before breakfast daily Physician will call you with results of your bloodwork

## 2011-08-09 LAB — CBC WITH DIFFERENTIAL/PLATELET
Basophils Absolute: 0 10*3/uL (ref 0.0–0.1)
HCT: 42.6 % (ref 36.0–46.0)
Hemoglobin: 14 g/dL (ref 12.0–15.0)
Lymphocytes Relative: 40 % (ref 12–46)
Monocytes Absolute: 0.4 10*3/uL (ref 0.1–1.0)
Monocytes Relative: 7 % (ref 3–12)
Neutro Abs: 2.9 10*3/uL (ref 1.7–7.7)
Neutrophils Relative %: 48 % (ref 43–77)
RDW: 12.4 % (ref 11.5–15.5)
WBC: 6 10*3/uL (ref 4.0–10.5)

## 2011-08-09 LAB — ANA: Anti Nuclear Antibody(ANA): NEGATIVE

## 2011-08-09 LAB — ANTI-SMOOTH MUSCLE ANTIBODY, IGG: Smooth Muscle Ab: 28 U — ABNORMAL HIGH (ref <20)

## 2011-08-09 LAB — FERRITIN: Ferritin: 48 ng/mL (ref 10–291)

## 2011-08-11 NOTE — Progress Notes (Signed)
Presenting complaint; elevated transaminases. Subjective; patient is 70 year old Caucasian female patient of Dr. Juanetta Gosling was ever scheduled visit. She was last seen on 10/12/2010. Her LFTs were first noted to be abnormal in August last year. Markers for hepatitis B and C. were negative an ultrasound was also normal. Zetia was stopped but LFTs remained abnormal. It was felt that she may have mild fatty liver not picked up on ultrasound. We have therefore been monitoring her. She was in the hospital with respiratory problems and may in her ASt was 28 and ALT. was 18.. On July 12 AST was 57 and ALT was 33. She is trying to exercise more and has lost 7 pounds. She stays busy. 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 She remains with good appetite. Her heartburn is well controlled with medications she denies abdominal pain pruritus melena or rectal bleeding. She remains worried that her transaminases are not normal. Current Outpatient Prescriptions  Medication Sig Dispense Refill  . albuterol (PROVENTIL) (2.5 MG/3ML) 0.083% nebulizer solution Take 2.5 mg by nebulization as needed.        Marland Kitchen aliskiren (TEKTURNA) 150 MG tablet Take 150 mg by mouth daily.        . Carboxymethylcellulose Sodium (THERATEARS) 0.25 % SOLN Apply to eye. 1 drop in each eye daily       . colesevelam (WELCHOL) 625 MG tablet Take 1,875 mg by mouth 2 (two) times daily with a meal.        . diltiazem (CARDIZEM CD) 180 MG 24 hr capsule Take 180 mg by mouth daily.        . fish oil-omega-3 fatty acids 1000 MG capsule Take 1 g by mouth daily.        . fluticasone (FLONASE) 50 MCG/ACT nasal spray Place 2 sprays into the nose daily.        . Fluticasone-Salmeterol (ADVAIR) 250-50 MCG/DOSE AEPB Inhale 1 puff into the lungs every 12 (twelve) hours.        Marland Kitchen l-methylfolate-b2-b6-b12 (CEREFOLIN) 03-23-49-5 MG TABS Take 1 tablet by mouth daily.        . Multiple Vitamins-Minerals (CENTRUM SILVER PO) Take by mouth daily.        . potassium  chloride (KLOR-CON) 10 MEQ CR tablet Take 10 mEq by mouth daily.        . sodium chloride (MURO 128) 2 % ophthalmic solution Place 2 drops into both eyes.        Marland Kitchen guanFACINE (TENEX) 1 MG tablet Take 1 mg by mouth at bedtime.        Marland Kitchen omeprazole-sodium bicarbonate (ZEGERID) 40-1100 MG per capsule Take 1 capsule by mouth daily before breakfast.  30 capsule  5   Objective BP 130/80  Pulse 76  Temp(Src) 97.8 F (36.6 C) (Oral)  Resp 14  Ht 5\' 3"  (1.6 m)  Wt 142 lb (64.411 kg)  BMI 25.15 kg/m2 Conjunctiva is pink. Sclerae nonicteric. Oropharyngeal mucosa is normal No neck masses or thyromegaly noted. Abdomen is soft and nontender without organomegaly or masses. No peripheral edema or clubbing noted. Current lab data  From 07-2011 bilirubin 0.5, AP 86, AST 84, ALT 84, total protein 7.4, and albumin is 4.2. Assessment Mildly elevated transaminases of 14 months duration with fluctuating pattern. These peaked in September last year when AST was 152 and ALT was 164. Curiously these were normal when she was admitted to the hospital in May with respiratory problems. AST and ALT are now about twice normal. This abnormality still could  be secondary to one of her medications although none of these are likely to do so. We also need to check for autoimmune or an allergic process. Patient does not have any stigmata of chronic liver disease and therefore there is no immediate risk to her health. Plan  CBC with differential, sedimentation rate, serum ferritin, alpha-1 antitrypsin, ANA and smooth muscle antibody. Further recommendations will depend on results of these studies. If these studies are inconclusive may ask Dr. Juanetta Gosling stop diltiazem if feasible.

## 2011-12-18 DIAGNOSIS — J441 Chronic obstructive pulmonary disease with (acute) exacerbation: Secondary | ICD-10-CM | POA: Diagnosis not present

## 2011-12-18 DIAGNOSIS — J329 Chronic sinusitis, unspecified: Secondary | ICD-10-CM | POA: Diagnosis not present

## 2011-12-18 DIAGNOSIS — R945 Abnormal results of liver function studies: Secondary | ICD-10-CM | POA: Diagnosis not present

## 2011-12-19 ENCOUNTER — Telehealth (INDEPENDENT_AMBULATORY_CARE_PROVIDER_SITE_OTHER): Payer: Self-pay | Admitting: *Deleted

## 2011-12-19 NOTE — Telephone Encounter (Signed)
Patient will have an appointment 02-13-12 , lab will be done 02-08-12.

## 2012-01-12 ENCOUNTER — Encounter (INDEPENDENT_AMBULATORY_CARE_PROVIDER_SITE_OTHER): Payer: Self-pay | Admitting: *Deleted

## 2012-01-12 DIAGNOSIS — I10 Essential (primary) hypertension: Secondary | ICD-10-CM | POA: Diagnosis not present

## 2012-01-12 DIAGNOSIS — N390 Urinary tract infection, site not specified: Secondary | ICD-10-CM | POA: Diagnosis not present

## 2012-01-12 DIAGNOSIS — J329 Chronic sinusitis, unspecified: Secondary | ICD-10-CM | POA: Diagnosis not present

## 2012-01-12 DIAGNOSIS — R000 Tachycardia, unspecified: Secondary | ICD-10-CM | POA: Diagnosis not present

## 2012-01-12 DIAGNOSIS — E785 Hyperlipidemia, unspecified: Secondary | ICD-10-CM | POA: Diagnosis not present

## 2012-02-08 ENCOUNTER — Other Ambulatory Visit (INDEPENDENT_AMBULATORY_CARE_PROVIDER_SITE_OTHER): Payer: Self-pay | Admitting: Internal Medicine

## 2012-02-08 LAB — HEPATIC FUNCTION PANEL
AST: 35 U/L (ref 0–37)
Albumin: 4.3 g/dL (ref 3.5–5.2)
Bilirubin, Direct: 0.1 mg/dL (ref 0.0–0.3)
Total Bilirubin: 0.5 mg/dL (ref 0.3–1.2)

## 2012-02-13 ENCOUNTER — Encounter (INDEPENDENT_AMBULATORY_CARE_PROVIDER_SITE_OTHER): Payer: Self-pay | Admitting: Internal Medicine

## 2012-02-13 ENCOUNTER — Ambulatory Visit (INDEPENDENT_AMBULATORY_CARE_PROVIDER_SITE_OTHER): Payer: Medicare Other | Admitting: Internal Medicine

## 2012-02-13 VITALS — BP 122/74 | HR 78 | Temp 97.8°F | Resp 18 | Ht 63.0 in | Wt 144.9 lb

## 2012-02-13 DIAGNOSIS — I169 Hypertensive crisis, unspecified: Secondary | ICD-10-CM | POA: Insufficient documentation

## 2012-02-13 DIAGNOSIS — J45909 Unspecified asthma, uncomplicated: Secondary | ICD-10-CM | POA: Insufficient documentation

## 2012-02-13 DIAGNOSIS — I10 Essential (primary) hypertension: Secondary | ICD-10-CM | POA: Insufficient documentation

## 2012-02-13 DIAGNOSIS — K219 Gastro-esophageal reflux disease without esophagitis: Secondary | ICD-10-CM | POA: Insufficient documentation

## 2012-02-13 DIAGNOSIS — I160 Hypertensive urgency: Secondary | ICD-10-CM | POA: Insufficient documentation

## 2012-02-13 NOTE — Patient Instructions (Signed)
Blood work to be repeated in 3 months.

## 2012-02-15 NOTE — Progress Notes (Signed)
Presenting complaint;  Followup for elevated transaminases.  Subjective:  Stephanie West a 71 year old Caucasian female who has history of elevated transaminases since August 2011. Her transaminases are normal in April 2011. Initially she was felt to have transaminitis secondary to statin but coming off the statin did not make difference. Viral markers for hepatitis B and C. were negative and sedimentation rate was normal. A few months ago we stopped her AcipHex. She feels fine. She denies abdominal pain nausea vomiting or pruritus.  Current Medications: Current Outpatient Prescriptions  Medication Sig Dispense Refill  . albuterol (PROVENTIL) (2.5 MG/3ML) 0.083% nebulizer solution Take 2.5 mg by nebulization as needed.        Marland Kitchen aliskiren (TEKTURNA) 150 MG tablet Take 150 mg by mouth daily.        . Azelastine-Fluticasone (DYMISTA) 137-50 MCG/ACT SUSP Place into the nose. 2 sprays each nostril daily      . Carboxymethylcellulose Sodium (THERATEARS) 0.25 % SOLN Apply to eye. 1 drop in each eye daily       . colesevelam (WELCHOL) 625 MG tablet Take 1,875 mg by mouth 2 (two) times daily with a meal.        . diltiazem (CARDIZEM CD) 180 MG 24 hr capsule Take 180 mg by mouth daily.        . fish oil-omega-3 fatty acids 1000 MG capsule Take 1 g by mouth daily.        . Fluticasone-Salmeterol (ADVAIR) 250-50 MCG/DOSE AEPB Inhale 1 puff into the lungs every 12 (twelve) hours.        Marland Kitchen guanFACINE (TENEX) 1 MG tablet Take 1 mg by mouth at bedtime.        Marland Kitchen l-methylfolate-b2-b6-b12 (CEREFOLIN) 03-23-49-5 MG TABS Take 1 tablet by mouth daily.        . Multiple Vitamins-Minerals (CENTRUM SILVER PO) Take by mouth daily.        . potassium chloride (KLOR-CON) 10 MEQ CR tablet Take 10 mEq by mouth daily.        . sodium chloride (MURO 128) 2 % ophthalmic solution Place 2 drops into both eyes.           Objective: Blood pressure 122/74, pulse 78, temperature 97.8 F (36.6 C), temperature source Oral, resp. rate  18, height 5\' 3"  (1.6 m), weight 144 lb 14.4 oz (65.726 kg). Patient does not appear to be in any distress Conjunctiva is pink. Sclera is nonicteric Oropharyngeal mucosa is normal. No neck masses or thyromegaly noted. Abdomen is full but soft and nontender without organomegaly or masses.  No LE edema or clubbing noted.  Labs/studies Results: From 02/08/2012. Bilirubin oh 0.5, AP 78, AST 35, ALT 26, albumin 4.3.   Assessment:  Elevated transaminases finally have normalized since AcipHex was discontinued. Therefore it may be drug induced subclinical, mild liver injury.   Plan:  Repeat LFTs in 3 months. Office visit in one year.

## 2012-02-22 ENCOUNTER — Encounter (INDEPENDENT_AMBULATORY_CARE_PROVIDER_SITE_OTHER): Payer: Self-pay

## 2012-04-17 ENCOUNTER — Other Ambulatory Visit (HOSPITAL_COMMUNITY): Payer: Self-pay | Admitting: Pulmonary Disease

## 2012-04-17 DIAGNOSIS — R51 Headache: Secondary | ICD-10-CM

## 2012-04-17 DIAGNOSIS — J449 Chronic obstructive pulmonary disease, unspecified: Secondary | ICD-10-CM | POA: Diagnosis not present

## 2012-04-18 ENCOUNTER — Ambulatory Visit (HOSPITAL_COMMUNITY): Payer: Medicare Other

## 2012-04-18 ENCOUNTER — Other Ambulatory Visit (INDEPENDENT_AMBULATORY_CARE_PROVIDER_SITE_OTHER): Payer: Self-pay | Admitting: *Deleted

## 2012-04-18 ENCOUNTER — Encounter (INDEPENDENT_AMBULATORY_CARE_PROVIDER_SITE_OTHER): Payer: Self-pay | Admitting: *Deleted

## 2012-04-23 ENCOUNTER — Ambulatory Visit (HOSPITAL_COMMUNITY)
Admission: RE | Admit: 2012-04-23 | Discharge: 2012-04-23 | Disposition: A | Discharge status: Home / Self Care | Payer: Medicare Other | Source: Ambulatory Visit | Attending: Pulmonary Disease | Admitting: Pulmonary Disease

## 2012-04-23 DIAGNOSIS — G319 Degenerative disease of nervous system, unspecified: Secondary | ICD-10-CM | POA: Diagnosis not present

## 2012-04-23 DIAGNOSIS — I10 Essential (primary) hypertension: Secondary | ICD-10-CM | POA: Diagnosis not present

## 2012-04-23 DIAGNOSIS — R51 Headache: Secondary | ICD-10-CM | POA: Insufficient documentation

## 2012-04-26 ENCOUNTER — Other Ambulatory Visit (HOSPITAL_COMMUNITY): Payer: Self-pay | Admitting: Pulmonary Disease

## 2012-04-26 DIAGNOSIS — E785 Hyperlipidemia, unspecified: Secondary | ICD-10-CM | POA: Diagnosis not present

## 2012-04-26 DIAGNOSIS — J029 Acute pharyngitis, unspecified: Secondary | ICD-10-CM | POA: Diagnosis not present

## 2012-04-26 DIAGNOSIS — I10 Essential (primary) hypertension: Secondary | ICD-10-CM | POA: Diagnosis not present

## 2012-04-26 DIAGNOSIS — J45901 Unspecified asthma with (acute) exacerbation: Secondary | ICD-10-CM | POA: Diagnosis not present

## 2012-04-26 DIAGNOSIS — J441 Chronic obstructive pulmonary disease with (acute) exacerbation: Secondary | ICD-10-CM | POA: Diagnosis not present

## 2012-04-26 DIAGNOSIS — R9089 Other abnormal findings on diagnostic imaging of central nervous system: Secondary | ICD-10-CM

## 2012-04-30 ENCOUNTER — Ambulatory Visit (HOSPITAL_COMMUNITY)
Admission: RE | Admit: 2012-04-30 | Discharge: 2012-04-30 | Disposition: A | Discharge status: Home / Self Care | Payer: Medicare Other | Source: Ambulatory Visit | Attending: Pulmonary Disease | Admitting: Pulmonary Disease

## 2012-04-30 DIAGNOSIS — D320 Benign neoplasm of cerebral meninges: Secondary | ICD-10-CM | POA: Diagnosis not present

## 2012-04-30 DIAGNOSIS — R51 Headache: Secondary | ICD-10-CM | POA: Diagnosis not present

## 2012-04-30 DIAGNOSIS — R930 Abnormal findings on diagnostic imaging of skull and head, not elsewhere classified: Secondary | ICD-10-CM | POA: Diagnosis not present

## 2012-04-30 DIAGNOSIS — J329 Chronic sinusitis, unspecified: Secondary | ICD-10-CM | POA: Diagnosis not present

## 2012-04-30 DIAGNOSIS — R9089 Other abnormal findings on diagnostic imaging of central nervous system: Secondary | ICD-10-CM

## 2012-04-30 MED ORDER — GADOBENATE DIMEGLUMINE 529 MG/ML IV SOLN
13.0000 mL | Freq: Once | INTRAVENOUS | Status: AC | PRN
  Administered 2012-04-30: 13 mL via INTRAVENOUS

## 2012-05-14 LAB — HEPATIC FUNCTION PANEL
ALT: 16 U/L (ref 0–35)
Alkaline Phosphatase: 70 U/L (ref 39–117)
Indirect Bilirubin: 0.3 mg/dL (ref 0.0–0.9)
Total Protein: 6.9 g/dL (ref 6.0–8.3)

## 2012-05-28 DIAGNOSIS — J029 Acute pharyngitis, unspecified: Secondary | ICD-10-CM | POA: Diagnosis not present

## 2012-05-28 DIAGNOSIS — I10 Essential (primary) hypertension: Secondary | ICD-10-CM | POA: Diagnosis not present

## 2012-05-28 DIAGNOSIS — J441 Chronic obstructive pulmonary disease with (acute) exacerbation: Secondary | ICD-10-CM | POA: Diagnosis not present

## 2012-05-28 DIAGNOSIS — J45901 Unspecified asthma with (acute) exacerbation: Secondary | ICD-10-CM | POA: Diagnosis not present

## 2012-05-28 DIAGNOSIS — E785 Hyperlipidemia, unspecified: Secondary | ICD-10-CM | POA: Diagnosis not present

## 2012-07-26 DIAGNOSIS — I10 Essential (primary) hypertension: Secondary | ICD-10-CM | POA: Diagnosis not present

## 2012-07-26 DIAGNOSIS — E785 Hyperlipidemia, unspecified: Secondary | ICD-10-CM | POA: Diagnosis not present

## 2012-07-26 DIAGNOSIS — J441 Chronic obstructive pulmonary disease with (acute) exacerbation: Secondary | ICD-10-CM | POA: Diagnosis not present

## 2012-08-12 DIAGNOSIS — L570 Actinic keratosis: Secondary | ICD-10-CM | POA: Diagnosis not present

## 2012-08-12 DIAGNOSIS — D234 Other benign neoplasm of skin of scalp and neck: Secondary | ICD-10-CM | POA: Diagnosis not present

## 2012-08-12 DIAGNOSIS — L820 Inflamed seborrheic keratosis: Secondary | ICD-10-CM | POA: Diagnosis not present

## 2012-08-12 DIAGNOSIS — I872 Venous insufficiency (chronic) (peripheral): Secondary | ICD-10-CM | POA: Diagnosis not present

## 2012-08-12 DIAGNOSIS — L851 Acquired keratosis [keratoderma] palmaris et plantaris: Secondary | ICD-10-CM | POA: Diagnosis not present

## 2012-08-23 DIAGNOSIS — H524 Presbyopia: Secondary | ICD-10-CM | POA: Diagnosis not present

## 2012-08-23 DIAGNOSIS — H2510 Age-related nuclear cataract, unspecified eye: Secondary | ICD-10-CM | POA: Diagnosis not present

## 2012-08-23 DIAGNOSIS — H52229 Regular astigmatism, unspecified eye: Secondary | ICD-10-CM | POA: Diagnosis not present

## 2012-08-23 DIAGNOSIS — H5200 Hypermetropia, unspecified eye: Secondary | ICD-10-CM | POA: Diagnosis not present

## 2012-09-23 DIAGNOSIS — Z23 Encounter for immunization: Secondary | ICD-10-CM | POA: Diagnosis not present

## 2012-10-25 DIAGNOSIS — J449 Chronic obstructive pulmonary disease, unspecified: Secondary | ICD-10-CM | POA: Diagnosis not present

## 2012-10-25 DIAGNOSIS — R945 Abnormal results of liver function studies: Secondary | ICD-10-CM | POA: Diagnosis not present

## 2012-10-25 DIAGNOSIS — I10 Essential (primary) hypertension: Secondary | ICD-10-CM | POA: Diagnosis not present

## 2012-10-25 DIAGNOSIS — J019 Acute sinusitis, unspecified: Secondary | ICD-10-CM | POA: Diagnosis not present

## 2013-01-29 DIAGNOSIS — J45901 Unspecified asthma with (acute) exacerbation: Secondary | ICD-10-CM | POA: Diagnosis not present

## 2013-01-29 DIAGNOSIS — J019 Acute sinusitis, unspecified: Secondary | ICD-10-CM | POA: Diagnosis not present

## 2013-01-29 DIAGNOSIS — J449 Chronic obstructive pulmonary disease, unspecified: Secondary | ICD-10-CM | POA: Diagnosis not present

## 2013-01-29 DIAGNOSIS — R000 Tachycardia, unspecified: Secondary | ICD-10-CM | POA: Diagnosis not present

## 2013-02-06 ENCOUNTER — Encounter (INDEPENDENT_AMBULATORY_CARE_PROVIDER_SITE_OTHER): Payer: Self-pay | Admitting: *Deleted

## 2013-02-10 ENCOUNTER — Other Ambulatory Visit (INDEPENDENT_AMBULATORY_CARE_PROVIDER_SITE_OTHER): Payer: Self-pay | Admitting: Internal Medicine

## 2013-02-11 LAB — HEPATIC FUNCTION PANEL
ALT: 14 U/L (ref 0–35)
Total Protein: 7.1 g/dL (ref 6.0–8.3)

## 2013-02-19 ENCOUNTER — Encounter (INDEPENDENT_AMBULATORY_CARE_PROVIDER_SITE_OTHER): Payer: Self-pay | Admitting: *Deleted

## 2013-02-19 ENCOUNTER — Encounter (INDEPENDENT_AMBULATORY_CARE_PROVIDER_SITE_OTHER): Payer: Self-pay | Admitting: Internal Medicine

## 2013-02-19 ENCOUNTER — Ambulatory Visit (INDEPENDENT_AMBULATORY_CARE_PROVIDER_SITE_OTHER): Payer: Medicare Other | Admitting: Internal Medicine

## 2013-02-19 ENCOUNTER — Telehealth (INDEPENDENT_AMBULATORY_CARE_PROVIDER_SITE_OTHER): Payer: Self-pay | Admitting: *Deleted

## 2013-02-19 ENCOUNTER — Other Ambulatory Visit (INDEPENDENT_AMBULATORY_CARE_PROVIDER_SITE_OTHER): Payer: Self-pay | Admitting: *Deleted

## 2013-02-19 VITALS — BP 118/80 | HR 80 | Ht 63.5 in | Wt 144.2 lb

## 2013-02-19 DIAGNOSIS — R1310 Dysphagia, unspecified: Secondary | ICD-10-CM

## 2013-02-19 NOTE — Progress Notes (Signed)
Subjective:     Patient ID: Stephanie West, female   DOB: [birth year], 72 y.o.   MRN: [id number]  HPI Here today for f/u of her elevated transaminases since August 2011. Her transaminases were normal in April of 2011.  It was thought that statin bumped her transaminases up so she came off them. This did not make a difference. Viral markers for Hepatitis B and C were negative and sedrate was normal.  Aciphex was eventually stopped and her transaminases normalized. [facility] Hepatic function panel reveal normal transaminases.(see below.  She tells me she does not feel good.  She tells me she is having trouble swallowing pills. At times she has trouble swallowing foods. Symptoms x 2 months. She had underwent EGD/ED in the past for same (2005) Acid reflux is controlled with Omeprazole.   EGD 2005: Dysphagia: FINAL DIAGNOSES:  1. Esophageal web and Schatzki's ring, both of which were disrupted by  passing 54 [place] Maloney dilator.  2. Small to moderate-sized sliding hiatal hernia.  3. Nonerosive antral gastritis.  4. A 3 mm fundal polyp that was ablated by cold biopsy.    Hepatic Function Panel     Component Value Date/Time   PROT 7.1 02/10/2013 1540   ALBUMIN 4.0 02/10/2013 1540   AST 19 02/10/2013 1540   ALT 14 02/10/2013 1540   ALKPHOS 79 02/10/2013 1540   BILITOT 0.3 02/10/2013 1540   BILIDIR <0.1 02/10/2013 1540   IBILI NOT CALC 02/10/2013 1540     Review of Systems see hpi Current Outpatient Prescriptions  Medication Sig Dispense Refill  . albuterol (PROVENTIL) (2.5 MG/3ML) 0.083% nebulizer solution Take 2.5 mg by nebulization as needed.        Marland Kitchen aliskiren (TEKTURNA) 150 MG tablet Take 150 mg by mouth daily.        Marland Kitchen aliskiren (TEKTURNA) 150 MG tablet Take 150 mg by mouth daily.      . Azelastine-Fluticasone (DYMISTA) 137-50 MCG/ACT SUSP Place into the nose. 2 sprays each nostril daily      . Carboxymethylcellulose Sodium (THERATEARS) 0.25 % SOLN Apply to eye. 1 drop in each eye daily        . colesevelam (WELCHOL) 625 MG tablet Take 1,875 mg by mouth 2 (two) times daily with a meal.        . diltiazem (CARDIZEM CD) 180 MG 24 hr capsule Take 180 mg by mouth daily.        Marland Kitchen ezetimibe (ZETIA) 10 MG tablet Take 10 mg by mouth daily.      . fish oil-omega-3 fatty acids 1000 MG capsule Take 1 g by mouth daily.        . Fluticasone-Salmeterol (ADVAIR) 250-50 MCG/DOSE AEPB Inhale 1 puff into the lungs every 12 (twelve) hours.        Marland Kitchen guanFACINE (TENEX) 1 MG tablet Take 1 mg by mouth at bedtime.        . Multiple Vitamins-Minerals (CENTRUM SILVER PO) Take by mouth daily.        Marland Kitchen omeprazole (PRILOSEC) 40 MG capsule Take 40 mg by mouth daily.      . potassium chloride (KLOR-CON) 10 MEQ CR tablet Take 10 mEq by mouth daily.        . sodium chloride (MURO 128) 2 % ophthalmic solution Place 2 drops into both eyes.         No current facility-administered medications for this visit.   Past Medical History  Diagnosis Date  . Hypertension   . Asthma   .  Elevated transaminase level   . GERD (gastroesophageal reflux disease)    Past Surgical History  Procedure Laterality Date  . Laparoscopic tubal ligation  1984  . Colonoscopy    . Upper gastrointestinal endoscopy     Allergies  Allergen Reactions  . Aspirin   . Avelox (Moxifloxacin Hcl In Nacl)   . Benadryl (Diphenhydramine Hcl)   . Factive (Gemifloxacin Mesylate)   . Factive (Gemifloxacin)   . Levaquin (Levofloxacin In D5w)   . Levofloxacin     She is also allergic to Xray dyes.  Marland Kitchen Penicillins   . Sulfa Antibiotics   . Tequin (Gatifloxacin)          Objective:   Physical Exam  Filed Vitals:   02/19/13 1430  BP: 118/80  Pulse: 80  Height: 5' 3.5" (1.613 m)  Weight: 144 lb 3.2 oz (65.409 kg)  Alert and oriented. Skin warm and dry. Oral mucosa is moist.   . Sclera anicteric, conjunctivae is pink. Thyroid not enlarged. No cervical lymphadenopathy. Lungs clear. Heart regular rate and rhythm.  Abdomen is soft. Bowel  sounds are positive. No hepatomegaly. No abdominal masses felt. No tenderness.  No edema to lower extremities.        Assessment:   Elevated transaminases which have normalized now.  Aciphex considered to be source of the bump. Dysphagia to solids and liquids.    Plan:    EGD/ED with Dr. Karilyn Cota.

## 2013-02-19 NOTE — Telephone Encounter (Signed)
Patient is sch;d for EGD/ED 5/16 for dysphagia -- she has MVP, will she need antibiotics prior to procedure, if so what kind -- thanks

## 2013-02-19 NOTE — Patient Instructions (Addendum)
EGD/ED with Dr. Rehman. 

## 2013-02-19 NOTE — Telephone Encounter (Signed)
.  Per Terri Setzer,NP 

## 2013-02-21 NOTE — Telephone Encounter (Signed)
Patient does not need antibiotic for and per current guidelines.

## 2013-02-24 ENCOUNTER — Encounter (HOSPITAL_COMMUNITY): Payer: Self-pay | Admitting: Pharmacy Technician

## 2013-03-07 ENCOUNTER — Encounter (HOSPITAL_COMMUNITY): Payer: Self-pay | Admitting: *Deleted

## 2013-03-07 ENCOUNTER — Ambulatory Visit (HOSPITAL_COMMUNITY)
Admission: RE | Admit: 2013-03-07 | Discharge: 2013-03-07 | Disposition: A | Discharge status: Home / Self Care | Payer: Medicare Other | Source: Ambulatory Visit | Attending: Internal Medicine | Admitting: Internal Medicine

## 2013-03-07 ENCOUNTER — Encounter (HOSPITAL_COMMUNITY)
Admission: RE | Disposition: A | Discharge status: Home / Self Care | Payer: Self-pay | Source: Ambulatory Visit | Attending: Internal Medicine

## 2013-03-07 DIAGNOSIS — Z888 Allergy status to other drugs, medicaments and biological substances status: Secondary | ICD-10-CM | POA: Insufficient documentation

## 2013-03-07 DIAGNOSIS — J45909 Unspecified asthma, uncomplicated: Secondary | ICD-10-CM | POA: Insufficient documentation

## 2013-03-07 DIAGNOSIS — Z883 Allergy status to other anti-infective agents status: Secondary | ICD-10-CM | POA: Insufficient documentation

## 2013-03-07 DIAGNOSIS — Z882 Allergy status to sulfonamides status: Secondary | ICD-10-CM | POA: Diagnosis not present

## 2013-03-07 DIAGNOSIS — Q391 Atresia of esophagus with tracheo-esophageal fistula: Secondary | ICD-10-CM | POA: Diagnosis not present

## 2013-03-07 DIAGNOSIS — I10 Essential (primary) hypertension: Secondary | ICD-10-CM | POA: Insufficient documentation

## 2013-03-07 DIAGNOSIS — R7401 Elevation of levels of liver transaminase levels: Secondary | ICD-10-CM | POA: Insufficient documentation

## 2013-03-07 DIAGNOSIS — K222 Esophageal obstruction: Secondary | ICD-10-CM

## 2013-03-07 DIAGNOSIS — I059 Rheumatic mitral valve disease, unspecified: Secondary | ICD-10-CM | POA: Insufficient documentation

## 2013-03-07 DIAGNOSIS — Q409 Congenital malformation of upper alimentary tract, unspecified: Secondary | ICD-10-CM | POA: Diagnosis not present

## 2013-03-07 DIAGNOSIS — K449 Diaphragmatic hernia without obstruction or gangrene: Secondary | ICD-10-CM | POA: Diagnosis not present

## 2013-03-07 DIAGNOSIS — Z79899 Other long term (current) drug therapy: Secondary | ICD-10-CM | POA: Diagnosis not present

## 2013-03-07 DIAGNOSIS — K219 Gastro-esophageal reflux disease without esophagitis: Secondary | ICD-10-CM | POA: Diagnosis not present

## 2013-03-07 DIAGNOSIS — R7402 Elevation of levels of lactic acid dehydrogenase (LDH): Secondary | ICD-10-CM | POA: Insufficient documentation

## 2013-03-07 DIAGNOSIS — Z880 Allergy status to penicillin: Secondary | ICD-10-CM | POA: Insufficient documentation

## 2013-03-07 DIAGNOSIS — Z91041 Radiographic dye allergy status: Secondary | ICD-10-CM | POA: Diagnosis not present

## 2013-03-07 DIAGNOSIS — IMO0002 Reserved for concepts with insufficient information to code with codable children: Secondary | ICD-10-CM | POA: Insufficient documentation

## 2013-03-07 DIAGNOSIS — R1310 Dysphagia, unspecified: Secondary | ICD-10-CM

## 2013-03-07 DIAGNOSIS — Z886 Allergy status to analgesic agent status: Secondary | ICD-10-CM | POA: Insufficient documentation

## 2013-03-07 HISTORY — PX: ESOPHAGOGASTRODUODENOSCOPY (EGD) WITH ESOPHAGEAL DILATION: SHX5812

## 2013-03-07 SURGERY — ESOPHAGOGASTRODUODENOSCOPY (EGD) WITH ESOPHAGEAL DILATION
Anesthesia: Moderate Sedation

## 2013-03-07 MED ORDER — MIDAZOLAM HCL 5 MG/5ML IJ SOLN
INTRAMUSCULAR | Status: AC
  Filled 2013-03-07: qty 10

## 2013-03-07 MED ORDER — MEPERIDINE HCL 50 MG/ML IJ SOLN
INTRAMUSCULAR | Status: AC
  Filled 2013-03-07: qty 1

## 2013-03-07 MED ORDER — MIDAZOLAM HCL 5 MG/5ML IJ SOLN
INTRAMUSCULAR | Status: DC | PRN
  Administered 2013-03-07: 2 mg via INTRAVENOUS
  Administered 2013-03-07: 1 mg via INTRAVENOUS
  Administered 2013-03-07: 2 mg via INTRAVENOUS

## 2013-03-07 MED ORDER — SODIUM CHLORIDE 0.9 % IV SOLN
INTRAVENOUS | Status: DC
  Administered 2013-03-07: 09:00:00 via INTRAVENOUS

## 2013-03-07 MED ORDER — MEPERIDINE HCL 25 MG/ML IJ SOLN
INTRAMUSCULAR | Status: DC | PRN
  Administered 2013-03-07 (×2): 25 mg via INTRAVENOUS

## 2013-03-07 MED ORDER — BUTAMBEN-TETRACAINE-BENZOCAINE 2-2-14 % EX AERO
INHALATION_SPRAY | CUTANEOUS | Status: DC | PRN
  Administered 2013-03-07: 2 via TOPICAL

## 2013-03-07 NOTE — Op Note (Signed)
EGD PROCEDURE REPORT  PATIENT:  Stephanie West  MR#:  [id number] Birthdate:  1941/07/12, 72 y.o., female Endoscopist:  Dr. Malissa Hippo, MD Referred By:  Dr. Oneal Deputy. Juanetta Gosling, MD  Procedure Date: 03/07/2013  Procedure:   EGD with ED.  Indications:  Patient is 72 year old Caucasian female with history of GERD and Schatzki's ring whose last dilation was in 2005 now presents with dysphagia to solids as well as large pills.            Informed Consent:  The risks, benefits, alternatives & imponderables which include, but are not limited to, bleeding, infection, perforation, drug reaction and potential missed lesion have been reviewed.  The potential for biopsy, lesion removal, esophageal dilation, etc. have also been discussed.  Questions have been answered.  All parties agreeable.  Please see history & physical in medical record for more information.  Medications:  Demerol 50 mg IV Versed 6 mg IV Cetacaine spray topically for oropharyngeal anesthesia  Description of procedure:  The endoscope was introduced through the mouth and advanced to the second portion of the duodenum without difficulty or limitations. The mucosal surfaces were surveyed very carefully during advancement of the scope and upon withdrawal.  Findings:  Esophagus:  Mucosa of the esophagus was normal. Body was somewhat tortuous. Noncritical ring noted at GE junction. GEJ:  29 cm Hiatus:  34 cm Stomach:  Stomach was empty and distended very well with insufflation. Folds in the proximal stomach were normal. Examination mucosa body was normal. Focal linear erythema noted at antrum but no erosions or ulcers noted. Pyloric channel was patent. Angularis fundus and cardia were examined by retroflex the scope. Hernia was easily identified on this view. Duodenum:  Normal bulbar and post bulbar mucosa.  Therapeutic/Diagnostic Maneuvers Performed:  Esophagus dilated by passing 54 [place] Maloney dilator to full insertion. As the  dilator was withdrawn and the scope was passed again. Linear mucosal destruction noted the cervical esophagus indicated of a web. Schatzki's ring was disrupted with focal biopsy but no tissue was saved.  Complications:  None  Impression: Noncritical Schatzki's ring along with moderate-sized sliding-type hernia. Esophagus dilated by passing 54 French Maloney dilator resulting in mucosal disruption the cervical esophagus indicative of a focal web. Schatzki's ring was disrupted with focal biopsy.  Recommendations:  Standard instructions given. Patient will continue antireflux measures and omeprazole as before. Patient will call office with progress report in one week.  REHMAN,NAJEEB U  03/07/2013  9:21 AM  CC: Dr. Fredirick Maudlin, MD & Dr. Bonnetta Barry ref. provider found

## 2013-03-07 NOTE — H&P (Signed)
Stephanie West is an 72 y.o. female.   Chief Complaint: Patient's here for EGD and EGD. HPI: Patient 72 year old Caucasian female with history of GERD who presents with dysphagia to solids as well as pills. Dysphagia started few months ago. She denies nausea vomiting anorexia or weight loss. She has history of Schatzki's ring and last EGD was in 2005.  Past Medical History  Diagnosis Date  . Hypertension   . Asthma   . Elevated transaminase level   . GERD (gastroesophageal reflux disease)   . MVP (mitral valve prolapse)     Past Surgical History  Procedure Laterality Date  . Laparoscopic tubal ligation  1984  . Colonoscopy    . Upper gastrointestinal endoscopy      Family History  Problem Relation Age of Onset  . Hypertension Mother   . Prostate cancer Father    Social History:  reports that she has never smoked. She has never used smokeless tobacco. She reports that she does not drink alcohol or use illicit drugs.  Allergies:  Allergies  Allergen Reactions  . Aspirin   . Avelox (Moxifloxacin Hcl In Nacl)   . Benadryl (Diphenhydramine Hcl)   . Factive (Gemifloxacin Mesylate)   . Factive (Gemifloxacin)   . Levaquin (Levofloxacin In D5w)   . Levofloxacin     She is also allergic to Xray dyes.  Marland Kitchen Penicillins   . Sulfa Antibiotics   . Tequin (Gatifloxacin)     Medications Prior to Admission  Medication Sig Dispense Refill  . albuterol (PROVENTIL) (2.5 MG/3ML) 0.083% nebulizer solution Take 2.5 mg by nebulization as needed.        Marland Kitchen aliskiren (TEKTURNA) 150 MG tablet Take 150 mg by mouth daily.        . Carboxymethylcellulose Sodium (THERATEARS) 0.25 % SOLN Apply to eye. 1 drop in each eye daily       . colesevelam (WELCHOL) 625 MG tablet Take 1,875 mg by mouth 2 (two) times daily with a meal.        . diltiazem (CARDIZEM CD) 180 MG 24 hr capsule Take 180 mg by mouth daily.        Marland Kitchen ezetimibe (ZETIA) 10 MG tablet Take 10 mg by mouth daily.      . fish oil-omega-3  fatty acids 1000 MG capsule Take 1 g by mouth daily.        . fluticasone (FLONASE) 50 MCG/ACT nasal spray Place 2 sprays into the nose daily.      . Fluticasone-Salmeterol (ADVAIR) 250-50 MCG/DOSE AEPB Inhale 1 puff into the lungs every 12 (twelve) hours.        Marland Kitchen guanFACINE (TENEX) 1 MG tablet Take 1 mg by mouth at bedtime.        . Multiple Vitamins-Minerals (CENTRUM SILVER PO) Take by mouth daily.        Marland Kitchen omeprazole (PRILOSEC) 40 MG capsule Take 40 mg by mouth daily.      . potassium chloride (KLOR-CON) 10 MEQ CR tablet Take 10 mEq by mouth daily.        . sodium chloride (MURO 128) 2 % ophthalmic solution Place 2 drops into both eyes 4 (four) times daily.         No results found for this or any previous visit (from the past 48 hour(s)). No results found.  ROS  Blood pressure 197/92, pulse 76, temperature 98.1 F (36.7 C), temperature source Oral, resp. rate 18, SpO2 98.00%. Physical Exam  Constitutional: She appears well-developed and  well-nourished.  HENT:  Mouth/Throat: Oropharynx is clear and moist.  Eyes: Conjunctivae are normal. No scleral icterus.  Neck: No thyromegaly present.  Cardiovascular: Normal rate, regular rhythm and normal heart sounds.   No murmur heard. Respiratory: Effort normal and breath sounds normal.  GI: She exhibits no distension and no mass. There is no tenderness.  Musculoskeletal: She exhibits no edema.  Lymphadenopathy:    She has no cervical adenopathy.  Neurological: She is alert.  Skin: Skin is warm and dry.     Assessment/Plan Dysphagia to solids and pills. History of GERD and Schatzki's ring. EGD an ED.  Keontae Levingston U 03/07/2013, 8:45 AM

## 2013-03-10 ENCOUNTER — Encounter (HOSPITAL_COMMUNITY): Payer: Self-pay | Admitting: Internal Medicine

## 2013-05-01 DIAGNOSIS — I10 Essential (primary) hypertension: Secondary | ICD-10-CM | POA: Diagnosis not present

## 2013-05-01 DIAGNOSIS — R000 Tachycardia, unspecified: Secondary | ICD-10-CM | POA: Diagnosis not present

## 2013-05-01 DIAGNOSIS — J329 Chronic sinusitis, unspecified: Secondary | ICD-10-CM | POA: Diagnosis not present

## 2013-05-01 DIAGNOSIS — J449 Chronic obstructive pulmonary disease, unspecified: Secondary | ICD-10-CM | POA: Diagnosis not present

## 2013-08-07 DIAGNOSIS — Z23 Encounter for immunization: Secondary | ICD-10-CM | POA: Diagnosis not present

## 2013-08-07 DIAGNOSIS — I10 Essential (primary) hypertension: Secondary | ICD-10-CM | POA: Diagnosis not present

## 2013-08-07 DIAGNOSIS — F411 Generalized anxiety disorder: Secondary | ICD-10-CM | POA: Diagnosis not present

## 2013-08-07 DIAGNOSIS — J449 Chronic obstructive pulmonary disease, unspecified: Secondary | ICD-10-CM | POA: Diagnosis not present

## 2013-08-07 DIAGNOSIS — J329 Chronic sinusitis, unspecified: Secondary | ICD-10-CM | POA: Diagnosis not present

## 2013-08-14 ENCOUNTER — Other Ambulatory Visit (HOSPITAL_COMMUNITY): Payer: Self-pay | Admitting: Pulmonary Disease

## 2013-08-14 ENCOUNTER — Ambulatory Visit (HOSPITAL_COMMUNITY)
Admission: RE | Admit: 2013-08-14 | Discharge: 2013-08-14 | Disposition: A | Discharge status: Home / Self Care | Payer: Medicare Other | Source: Ambulatory Visit | Attending: Pulmonary Disease | Admitting: Pulmonary Disease

## 2013-08-14 DIAGNOSIS — M542 Cervicalgia: Secondary | ICD-10-CM

## 2013-08-14 DIAGNOSIS — M5030 Other cervical disc degeneration, unspecified cervical region: Secondary | ICD-10-CM | POA: Diagnosis not present

## 2013-08-14 DIAGNOSIS — M25511 Pain in right shoulder: Secondary | ICD-10-CM

## 2013-08-14 DIAGNOSIS — M25512 Pain in left shoulder: Secondary | ICD-10-CM

## 2013-08-14 DIAGNOSIS — M19019 Primary osteoarthritis, unspecified shoulder: Secondary | ICD-10-CM | POA: Insufficient documentation

## 2013-08-14 DIAGNOSIS — M25519 Pain in unspecified shoulder: Secondary | ICD-10-CM | POA: Insufficient documentation

## 2013-08-14 DIAGNOSIS — M47812 Spondylosis without myelopathy or radiculopathy, cervical region: Secondary | ICD-10-CM | POA: Diagnosis not present

## 2013-08-25 DIAGNOSIS — H5200 Hypermetropia, unspecified eye: Secondary | ICD-10-CM | POA: Diagnosis not present

## 2013-08-25 DIAGNOSIS — H2510 Age-related nuclear cataract, unspecified eye: Secondary | ICD-10-CM | POA: Diagnosis not present

## 2013-08-25 DIAGNOSIS — H40059 Ocular hypertension, unspecified eye: Secondary | ICD-10-CM | POA: Diagnosis not present

## 2013-09-10 DIAGNOSIS — J45909 Unspecified asthma, uncomplicated: Secondary | ICD-10-CM | POA: Diagnosis not present

## 2013-09-10 DIAGNOSIS — J449 Chronic obstructive pulmonary disease, unspecified: Secondary | ICD-10-CM | POA: Diagnosis not present

## 2013-09-15 DIAGNOSIS — D235 Other benign neoplasm of skin of trunk: Secondary | ICD-10-CM | POA: Diagnosis not present

## 2013-09-15 DIAGNOSIS — L570 Actinic keratosis: Secondary | ICD-10-CM | POA: Diagnosis not present

## 2013-09-15 DIAGNOSIS — D045 Carcinoma in situ of skin of trunk: Secondary | ICD-10-CM | POA: Diagnosis not present

## 2013-10-06 DIAGNOSIS — Z23 Encounter for immunization: Secondary | ICD-10-CM | POA: Diagnosis not present

## 2013-10-20 DIAGNOSIS — Z85828 Personal history of other malignant neoplasm of skin: Secondary | ICD-10-CM | POA: Diagnosis not present

## 2013-10-20 DIAGNOSIS — D0439 Carcinoma in situ of skin of other parts of face: Secondary | ICD-10-CM | POA: Diagnosis not present

## 2013-10-25 ENCOUNTER — Emergency Department (HOSPITAL_COMMUNITY)
Admission: EM | Admit: 2013-10-25 | Discharge: 2013-10-25 | Disposition: A | Discharge status: Home / Self Care | Payer: Medicare Other | Attending: Emergency Medicine | Admitting: Emergency Medicine

## 2013-10-25 ENCOUNTER — Emergency Department (HOSPITAL_COMMUNITY): Payer: Medicare Other

## 2013-10-25 ENCOUNTER — Encounter (HOSPITAL_COMMUNITY): Payer: Self-pay | Admitting: Emergency Medicine

## 2013-10-25 DIAGNOSIS — Z79899 Other long term (current) drug therapy: Secondary | ICD-10-CM | POA: Diagnosis not present

## 2013-10-25 DIAGNOSIS — J441 Chronic obstructive pulmonary disease with (acute) exacerbation: Secondary | ICD-10-CM | POA: Diagnosis not present

## 2013-10-25 DIAGNOSIS — J984 Other disorders of lung: Secondary | ICD-10-CM | POA: Diagnosis not present

## 2013-10-25 DIAGNOSIS — Z880 Allergy status to penicillin: Secondary | ICD-10-CM | POA: Insufficient documentation

## 2013-10-25 DIAGNOSIS — I10 Essential (primary) hypertension: Secondary | ICD-10-CM | POA: Diagnosis not present

## 2013-10-25 DIAGNOSIS — J45909 Unspecified asthma, uncomplicated: Secondary | ICD-10-CM | POA: Diagnosis not present

## 2013-10-25 DIAGNOSIS — K219 Gastro-esophageal reflux disease without esophagitis: Secondary | ICD-10-CM | POA: Insufficient documentation

## 2013-10-25 DIAGNOSIS — R0602 Shortness of breath: Secondary | ICD-10-CM | POA: Diagnosis not present

## 2013-10-25 DIAGNOSIS — IMO0002 Reserved for concepts with insufficient information to code with codable children: Secondary | ICD-10-CM | POA: Insufficient documentation

## 2013-10-25 MED ORDER — CIPROFLOXACIN HCL 250 MG PO TABS
500.0000 mg | ORAL_TABLET | Freq: Two times a day (BID) | ORAL | Status: DC
  Administered 2013-10-25: 500 mg via ORAL
  Filled 2013-10-25: qty 2

## 2013-10-25 MED ORDER — IPRATROPIUM BROMIDE 0.02 % IN SOLN
0.5000 mg | Freq: Once | RESPIRATORY_TRACT | Status: AC
  Administered 2013-10-25: 0.5 mg via RESPIRATORY_TRACT
  Filled 2013-10-25: qty 2.5

## 2013-10-25 MED ORDER — ALBUTEROL SULFATE (2.5 MG/3ML) 0.083% IN NEBU
5.0000 mg | INHALATION_SOLUTION | Freq: Once | RESPIRATORY_TRACT | Status: AC
  Administered 2013-10-25: 5 mg via RESPIRATORY_TRACT
  Filled 2013-10-25: qty 6

## 2013-10-25 MED ORDER — PREDNISONE 50 MG PO TABS
60.0000 mg | ORAL_TABLET | Freq: Once | ORAL | Status: AC
  Administered 2013-10-25: 60 mg via ORAL
  Filled 2013-10-25 (×2): qty 1

## 2013-10-25 MED ORDER — PREDNISONE 20 MG PO TABS: 20.0000 mg | ORAL_TABLET | Freq: Every day | ORAL | Status: DC

## 2013-10-25 MED ORDER — PREDNISONE 20 MG PO TABS: 20.0000 mg | ORAL_TABLET | Freq: Two times a day (BID) | ORAL | Status: DC

## 2013-10-25 MED ORDER — CIPROFLOXACIN HCL 500 MG PO TABS: 500.0000 mg | ORAL_TABLET | Freq: Two times a day (BID) | ORAL | Status: DC

## 2013-10-25 NOTE — ED Notes (Signed)
Pt was ambulated at this time pt o2 saturation dropped down to 85% and pt was a little short of breath. Stephanie West

## 2013-10-25 NOTE — ED Provider Notes (Signed)
CSN: [id number]     Arrival date & time 10/25/13  0908 History   First MD Initiated Contact with Patient 10/25/13 [phone]     Chief Complaint  Patient presents with  . Cough   (Consider location/radiation/quality/duration/timing/severity/associated sxs/prior Treatment) HPI Comments: Stephanie West is a 73 y.o. female complains of shortness of breath, dyspnea on exertion, cough, and sputum production for 3 days. The distress is getting somewhat worse. She has a beer a lot at home, but has not used it. She denies fever, chest pain, focal weakness, dizziness, nausea, or vomiting. There are no other known modifying factors.   Patient is a 73 y.o. female presenting with cough. The history is provided by the patient and the spouse.  Cough   Past Medical History  Diagnosis Date  . Hypertension   . Asthma   . Elevated transaminase level   . GERD (gastroesophageal reflux disease)   . MVP (mitral valve prolapse)    Past Surgical History  Procedure Laterality Date  . Laparoscopic tubal ligation  1984  . Colonoscopy    . Upper gastrointestinal endoscopy    . Esophagogastroduodenoscopy (egd) with esophageal dilation N/A 03/07/2013    Procedure: ESOPHAGOGASTRODUODENOSCOPY (EGD) WITH ESOPHAGEAL DILATION;  Surgeon: Rogene Houston, MD;  Location: [facility];  Service: Endoscopy;  Laterality: N/A;  62   Family History  Problem Relation Age of Onset  . Hypertension Mother   . Prostate cancer Father    History  Substance Use Topics  . Smoking status: Never Smoker   . Smokeless tobacco: Never Used  . Alcohol Use: No   OB History   Grav Para Term Preterm Abortions TAB SAB Ect Mult Living                 Review of Systems  Respiratory: Positive for cough.   All other systems reviewed and are negative.    Allergies  Aspirin; Avelox; Bee venom; Benadryl; Chicken allergy; Codeine; Factive; Factive; Levaquin; Levofloxacin; Penicillins; Sulfa antibiotics; and Tequin  Home Medications    Current Outpatient Rx  Name  Route  Sig  Dispense  Refill  . albuterol (PROVENTIL) (2.5 MG/3ML) 0.083% nebulizer solution   Nebulization   Take 2.5 mg by nebulization as needed.           Marland Kitchen aliskiren (TEKTURNA) 150 MG tablet   Oral   Take 150 mg by mouth at bedtime.          . colesevelam (WELCHOL) 625 MG tablet   Oral   Take 1,875 mg by mouth 2 (two) times daily with a meal.          . diltiazem (CARDIZEM CD) 180 MG 24 hr capsule   Oral   Take 180 mg by mouth at bedtime.          Marland Kitchen ezetimibe (ZETIA) 10 MG tablet   Oral   Take 10 mg by mouth daily.         . fish oil-omega-3 fatty acids 1000 MG capsule   Oral   Take 1 g by mouth daily.           . Fluticasone-Salmeterol (ADVAIR) 250-50 MCG/DOSE AEPB   Inhalation   Inhale 1 puff into the lungs every 12 (twelve) hours.          Marland Kitchen guanFACINE (TENEX) 1 MG tablet   Oral   Take 1 mg by mouth at bedtime.           Marland Kitchen omeprazole (PRILOSEC)  40 MG capsule   Oral   Take 40 mg by mouth daily.         . potassium chloride (KLOR-CON) 10 MEQ CR tablet   Oral   Take 10 mEq by mouth daily.           . Carboxymethylcellulose Sodium (THERATEARS) 0.25 % SOLN   Ophthalmic   Apply to eye. 1 drop in each eye daily          . ciprofloxacin (CIPRO) 500 MG tablet   Oral   Take 1 tablet (500 mg total) by mouth 2 (two) times daily.   20 tablet   0   . fluticasone (FLONASE) 50 MCG/ACT nasal spray   Nasal   Place 2 sprays into the nose daily.         . predniSONE (DELTASONE) 20 MG tablet   Oral   Take 1 tablet (20 mg total) by mouth 2 (two) times daily.   10 tablet   0   . sodium chloride (MURO 128) 2 % ophthalmic solution   Both Eyes   Place 2 drops into both eyes 4 (four) times daily.           BP 135/64  Pulse 105  Temp(Src) 98.8 F (37.1 C)  Resp 17  Ht 5\' 3"  (1.6 m)  Wt 145 lb (65.772 kg)  BMI 25.69 kg/m2  SpO2 96% Physical Exam  Nursing note and vitals reviewed. Constitutional: She is  oriented to person, place, and time. She appears well-developed.  Elderly, frail  HENT:  Head: Normocephalic and atraumatic.  Eyes: Conjunctivae and EOM are normal. Pupils are equal, round, and reactive to light.  Neck: Normal range of motion and phonation normal. Neck supple.  Cardiovascular: Normal rate, regular rhythm and intact distal pulses.   Pulmonary/Chest: Effort normal. No respiratory distress. She exhibits no tenderness.  Prolonged expiration with scattered rhonchi and wheezing  Abdominal: Soft. She exhibits no distension. There is no tenderness. There is no guarding.  Musculoskeletal: Normal range of motion. She exhibits no edema and no tenderness.  Neurological: She is alert and oriented to person, place, and time. She exhibits normal muscle tone.  Skin: Skin is warm and dry.  Psychiatric: She has a normal mood and affect. Her behavior is normal. Judgment and thought content normal.    ED Course  Procedures (including critical care time)  Medications  ciprofloxacin (CIPRO) tablet 500 mg (500 mg Oral Given 10/25/13 1214)  albuterol (PROVENTIL) (2.5 MG/3ML) 0.083% nebulizer solution 5 mg (5 mg Nebulization Given 10/25/13 1008)  ipratropium (ATROVENT) nebulizer solution 0.5 mg (0.5 mg Nebulization Given 10/25/13 1008)  albuterol (PROVENTIL) (2.5 MG/3ML) 0.083% nebulizer solution 5 mg (5 mg Nebulization Given 10/25/13 1136)  ipratropium (ATROVENT) nebulizer solution 0.5 mg (0.5 mg Nebulization Given 10/25/13 1136)  predniSONE (DELTASONE) tablet 60 mg (60 mg Oral Given 10/25/13 1158)     Patient Vitals for the past 24 hrs:  BP Temp Pulse Resp SpO2 Height Weight  10/25/13 1212 135/64 mmHg - 105 17 96 % - -  10/25/13 1145 - - 102 - 100 % - -  10/25/13 1136 - - - - 94 % - -  10/25/13 1115 - - 95 - 93 % - -  10/25/13 1030 - - 104 - 100 % - -  10/25/13 1008 - - - - 92 % - -  10/25/13 1000 - - 90 - 92 % - -  10/25/13 0932 - - - - 91 % - -  10/25/13 0915 169/73 mmHg 98.8 F (37.1 C) 113  24 96 % - -  10/25/13 0913 - - - - - 5\' 3"  (1.6 m) 145 lb (65.772 kg)   4:01 PM Reevaluation with update and discussion. After initial assessment and treatment, an updated evaluation reveals She feels better. [place] Review Labs Reviewed - No data to display Imaging Review Dg Chest 2 View  10/25/2013   CLINICAL DATA:  Productive cough  EXAM: CHEST  2 VIEW  COMPARISON:  04/19/2011, CT chest 03/14/2011  FINDINGS: There is right middle lobe airspace disease which may reflect atelectasis versus pneumonia versus scarring. There is no other focal parenchymal opacity. There is no pleural effusion or pneumothorax. Stable cardiomediastinal silhouette.  There is mild degenerative disc disease of the upper thoracic spine. There is a moderate-sized hiatal hernia.  IMPRESSION: Right middle lobe hazy airspace disease which may reflect atelectasis versus pneumonia versus scarring.   Electronically Signed   By: Kathreen Devoid   On: 10/25/2013 09:35    EKG Interpretation   None       MDM   1. COPD exacerbation    COPD exacerbation with a borderline hypoxia. She desaturates with ambulation, improves with rest and deep breathing. Offered her admission, and/or temps to obtain home oxygen, but she stated she felt good enough to go home and will followup with her pulmonologist, in the next few days    The patient appears reasonably screened and/or stabilized for discharge and I doubt any other medical condition or other [facility] requiring further screening, evaluation, or treatment in the ED at this time prior to discharge.   Nursing Notes Reviewed/ Care Coordinated, and agree without changes. Applicable Imaging Reviewed.  Interpretation of Laboratory Data incorporated into ED treatment   Plan: Home Medications- Cipro, Prednisone; Home Treatments and Observation- Rest; return here if the recommended treatment, does not improve the symptoms; Recommended follow up- Pulm. 3-4 days      Richarda Blade, MD 10/25/13 [phone]

## 2013-10-25 NOTE — Discharge Instructions (Signed)
Use your nebulizer every 3 or 4 hours, as needed for cough, or trouble breathing.    Chronic Asthmatic Bronchitis Chronic asthmatic bronchitis is often a complication of frequent asthma and/or bronchitis. After a long enough period of time, the continual airflow blockage is present in spite of treatment for asthma. The medications that used to treat asthma no longer work. The symptoms of chronic bronchitis may also be present. Bronchitis is an inflammation of the breathing tubules in the lungs. The combination of asthma, chronic bronchitis, and emphysema all affect the small breathing tubules (bronchial tree) in our lungs. It is a common condition. The problems from each are similar and overlap with each other so are sometimes hard to diagnose. When the asthma and bronchitis are combined, there is usually inflammation and infection. The small bronchial tubes produce more mucus. This blocks the airways and makes breathing harder. Usually this process is caused more by external irritants than infection. Smokers with chronic bronchitis are at a greater risk to develop asthmatic bronchitis. CAUSES   Why some people with asthma go on to develop chronic asthmatic bronchitis is not known. Smoking and environmental toxins or allergens seem to play a role. There are wide differences in who is susceptible.  Abnormalities of the small airways may develop in persons with persistent asthma. Asthmatics can be uncommonly subject to the effects of smoking. Asthma is also found associated with a number of other diseases. SYMPTOMS  Asthma, chronic bronchitis, and emphysema all cause symptoms of cough, wheezing, shortness of breath, and recurring infections. There may also be chest discomfort. All of the above symptoms happen more often in chronic asthmatic bronchitis. DIAGNOSIS   Asthma, chronic bronchitis, and emphysema all affect the entire bronchial tree. This makes it difficult on exam to tell them apart. Other  tests of the lungs are done to prove a diagnosis. These are called pulmonary function tests. TREATMENT   The asthmatic condition itself must always be treated.  Infection can be treated with antibiotics (medications to kill germs).  Serious infections may require hospitalization. These can include pneumonia, sinus infections, and acute bronchitis. HOME CARE INSTRUCTIONS  Use prescription medications as ordered by your caregiver.  Avoid pollen, dust, animal dander, molds, smoke, and other things that cause attacks at home and at work.  You may have fewer attacks if you decrease dust in your home. Electrostatic air cleaners may help.  It may help to replace your pillows or mattress with materials less likely to cause allergies.  If you are not on fluid restriction, drink 8 to 10 glasses of water each day.  Discuss possible exercise routines with your caregiver.  If animal dander is the cause of asthma, you may need to get rid of pets.  It is important that you:  Become educated about your medical condition.  Participate in maintaining wellness.  Seek medical care promptly or immediately as indicated below.  Delay in seeking medical attention could cause permanent injury and may be a risk to your life. SEEK MEDICAL CARE IF  You have wheezing and shortness of breath even if taking medicine to prevent attacks.  An oral temperature above 102 F (38.9 C)  You have muscle aches, chest pain, or thickening of sputum.  Your sputum changes from clear or white to yellow, green, gray, or bloody.  You have any problems that may be related to the medicine you are taking (such as a rash, itching, swelling, or trouble breathing). SEEK IMMEDIATE MEDICAL CARE IF:  Your  usual medicines do not stop your wheezing.  There is increased coughing and/or shortness of breath.  You have increased difficulty breathing. MAKE SURE YOU:   Understand these instructions.  Will watch your  condition.  Will get help right away if you are not doing well or get worse. Document Released: 07/27/2006 Document Revised: 02/03/2013 Document Reviewed: 09/24/2007 [facility] Patient Information 2014 [place].

## 2013-10-25 NOTE — ED Notes (Signed)
Pt reports productive cough-green sputum and ha x 3 days.

## 2013-10-27 DIAGNOSIS — F411 Generalized anxiety disorder: Secondary | ICD-10-CM | POA: Diagnosis not present

## 2013-10-27 DIAGNOSIS — J209 Acute bronchitis, unspecified: Secondary | ICD-10-CM | POA: Diagnosis not present

## 2013-10-27 DIAGNOSIS — I10 Essential (primary) hypertension: Secondary | ICD-10-CM | POA: Diagnosis not present

## 2013-10-27 DIAGNOSIS — J449 Chronic obstructive pulmonary disease, unspecified: Secondary | ICD-10-CM | POA: Diagnosis not present

## 2013-11-08 ENCOUNTER — Encounter (HOSPITAL_COMMUNITY): Payer: Self-pay | Admitting: Emergency Medicine

## 2013-11-08 ENCOUNTER — Emergency Department (HOSPITAL_COMMUNITY)
Admission: EM | Admit: 2013-11-08 | Discharge: 2013-11-08 | Disposition: A | Discharge status: Home / Self Care | Payer: Medicare Other | Attending: Emergency Medicine | Admitting: Emergency Medicine

## 2013-11-08 ENCOUNTER — Emergency Department (HOSPITAL_COMMUNITY): Payer: Medicare Other

## 2013-11-08 DIAGNOSIS — S81809A Unspecified open wound, unspecified lower leg, initial encounter: Secondary | ICD-10-CM

## 2013-11-08 DIAGNOSIS — Y939 Activity, unspecified: Secondary | ICD-10-CM | POA: Insufficient documentation

## 2013-11-08 DIAGNOSIS — Z79899 Other long term (current) drug therapy: Secondary | ICD-10-CM | POA: Insufficient documentation

## 2013-11-08 DIAGNOSIS — IMO0002 Reserved for concepts with insufficient information to code with codable children: Secondary | ICD-10-CM | POA: Insufficient documentation

## 2013-11-08 DIAGNOSIS — S1093XA Contusion of unspecified part of neck, initial encounter: Secondary | ICD-10-CM

## 2013-11-08 DIAGNOSIS — Z8679 Personal history of other diseases of the circulatory system: Secondary | ICD-10-CM | POA: Diagnosis not present

## 2013-11-08 DIAGNOSIS — I10 Essential (primary) hypertension: Secondary | ICD-10-CM | POA: Insufficient documentation

## 2013-11-08 DIAGNOSIS — S81009A Unspecified open wound, unspecified knee, initial encounter: Secondary | ICD-10-CM | POA: Insufficient documentation

## 2013-11-08 DIAGNOSIS — T07XXXA Unspecified multiple injuries, initial encounter: Secondary | ICD-10-CM

## 2013-11-08 DIAGNOSIS — S0083XA Contusion of other part of head, initial encounter: Secondary | ICD-10-CM | POA: Diagnosis not present

## 2013-11-08 DIAGNOSIS — Z880 Allergy status to penicillin: Secondary | ICD-10-CM | POA: Diagnosis not present

## 2013-11-08 DIAGNOSIS — Z792 Long term (current) use of antibiotics: Secondary | ICD-10-CM | POA: Diagnosis not present

## 2013-11-08 DIAGNOSIS — Z23 Encounter for immunization: Secondary | ICD-10-CM | POA: Insufficient documentation

## 2013-11-08 DIAGNOSIS — K219 Gastro-esophageal reflux disease without esophagitis: Secondary | ICD-10-CM | POA: Insufficient documentation

## 2013-11-08 DIAGNOSIS — S01501A Unspecified open wound of lip, initial encounter: Secondary | ICD-10-CM | POA: Insufficient documentation

## 2013-11-08 DIAGNOSIS — S0993XA Unspecified injury of face, initial encounter: Secondary | ICD-10-CM | POA: Diagnosis not present

## 2013-11-08 DIAGNOSIS — S0003XA Contusion of scalp, initial encounter: Secondary | ICD-10-CM | POA: Diagnosis not present

## 2013-11-08 DIAGNOSIS — J45909 Unspecified asthma, uncomplicated: Secondary | ICD-10-CM | POA: Diagnosis not present

## 2013-11-08 DIAGNOSIS — S0990XA Unspecified injury of head, initial encounter: Secondary | ICD-10-CM | POA: Diagnosis not present

## 2013-11-08 DIAGNOSIS — Y929 Unspecified place or not applicable: Secondary | ICD-10-CM | POA: Insufficient documentation

## 2013-11-08 DIAGNOSIS — S91009A Unspecified open wound, unspecified ankle, initial encounter: Secondary | ICD-10-CM | POA: Diagnosis not present

## 2013-11-08 DIAGNOSIS — S0180XA Unspecified open wound of other part of head, initial encounter: Secondary | ICD-10-CM | POA: Diagnosis not present

## 2013-11-08 DIAGNOSIS — R296 Repeated falls: Secondary | ICD-10-CM | POA: Insufficient documentation

## 2013-11-08 DIAGNOSIS — S0181XA Laceration without foreign body of other part of head, initial encounter: Secondary | ICD-10-CM

## 2013-11-08 MED ORDER — CIPROFLOXACIN HCL 250 MG PO TABS
500.0000 mg | ORAL_TABLET | Freq: Once | ORAL | Status: AC
  Administered 2013-11-08: 500 mg via ORAL
  Filled 2013-11-08: qty 2

## 2013-11-08 MED ORDER — TETANUS-DIPHTH-ACELL PERTUSSIS 5-2.5-18.5 LF-MCG/0.5 IM SUSP
0.5000 mL | Freq: Once | INTRAMUSCULAR | Status: AC
  Administered 2013-11-08: 0.5 mL via INTRAMUSCULAR
  Filled 2013-11-08: qty 0.5

## 2013-11-08 MED ORDER — LIDOCAINE HCL (PF) 1 % IJ SOLN
INTRAMUSCULAR | Status: AC
  Administered 2013-11-08: 22:00:00
  Filled 2013-11-08: qty 5

## 2013-11-08 NOTE — ED Provider Notes (Addendum)
CSN: [id number]     Arrival date & time 11/08/13  2043 History   First MD Initiated Contact with Patient 11/08/13 2103     Chief Complaint  Patient presents with  . Laceration   (Consider location/radiation/quality/duration/timing/severity/associated sxs/prior Treatment) Patient is a 73 y.o. female presenting with skin laceration. The history is provided by the patient and the spouse.  Laceration Location:  Face Facial laceration location:  Chin and lip Length (cm):  2.7 Quality: straight   Bleeding: controlled   Time since incident:  1 hour Laceration mechanism:  Fall Pain details:    Severity:  Mild   Timing:  Constant   Progression:  Unchanged Foreign body present:  No foreign bodies Relieved by:  Pressure Worsened by:  Nothing tried Ineffective treatments:  None tried   Past Medical History  Diagnosis Date  . Hypertension   . Asthma   . Elevated transaminase level   . GERD (gastroesophageal reflux disease)   . MVP (mitral valve prolapse)    Past Surgical History  Procedure Laterality Date  . Laparoscopic tubal ligation  1984  . Colonoscopy    . Upper gastrointestinal endoscopy    . Esophagogastroduodenoscopy (egd) with esophageal dilation N/A 03/07/2013    Procedure: ESOPHAGOGASTRODUODENOSCOPY (EGD) WITH ESOPHAGEAL DILATION;  Surgeon: Rogene Houston, MD;  Location: [facility];  Service: Endoscopy;  Laterality: N/A;  15   Family History  Problem Relation Age of Onset  . Hypertension Mother   . Prostate cancer Father    History  Substance Use Topics  . Smoking status: Never Smoker   . Smokeless tobacco: Never Used  . Alcohol Use: No   OB History   Grav Para Term Preterm Abortions TAB SAB Ect Mult Living                 Review of Systems  Constitutional: Negative for activity change.       All ROS Neg except as noted in HPI  HENT: Negative for nosebleeds.   Eyes: Negative for photophobia and discharge.  Respiratory: Negative for cough, shortness of  breath and wheezing.   Cardiovascular: Negative for chest pain and palpitations.  Gastrointestinal: Negative for abdominal pain and blood in stool.  Genitourinary: Negative for dysuria, frequency and hematuria.  Musculoskeletal: Positive for arthralgias. Negative for back pain and neck pain.  Skin: Negative.   Neurological: Negative for dizziness, seizures and speech difficulty.  Psychiatric/Behavioral: Negative for hallucinations and confusion.    Allergies  Aspirin; Avelox; Bee venom; Benadryl; Chicken allergy; Codeine; Factive; Factive; Levaquin; Levofloxacin; Penicillins; Sulfa antibiotics; and Tequin  Home Medications   Current Outpatient Rx  Name  Route  Sig  Dispense  Refill  . albuterol (PROVENTIL) (2.5 MG/3ML) 0.083% nebulizer solution   Nebulization   Take 2.5 mg by nebulization as needed.           Marland Kitchen aliskiren (TEKTURNA) 150 MG tablet   Oral   Take 150 mg by mouth at bedtime.          . Carboxymethylcellulose Sodium (THERATEARS) 0.25 % SOLN   Ophthalmic   Apply to eye. 1 drop in each eye daily          . ciprofloxacin (CIPRO) 500 MG tablet   Oral   Take 1 tablet (500 mg total) by mouth 2 (two) times daily.   20 tablet   0   . colesevelam (WELCHOL) 625 MG tablet   Oral   Take 1,875 mg by mouth 2 (two) times  daily with a meal.          . diltiazem (CARDIZEM CD) 180 MG 24 hr capsule   Oral   Take 180 mg by mouth at bedtime.          Marland Kitchen ezetimibe (ZETIA) 10 MG tablet   Oral   Take 10 mg by mouth daily.         . fish oil-omega-3 fatty acids 1000 MG capsule   Oral   Take 1 g by mouth daily.           . fluticasone (FLONASE) 50 MCG/ACT nasal spray   Nasal   Place 2 sprays into the nose daily.         . Fluticasone-Salmeterol (ADVAIR) 250-50 MCG/DOSE AEPB   Inhalation   Inhale 1 puff into the lungs every 12 (twelve) hours.          Marland Kitchen guanFACINE (TENEX) 1 MG tablet   Oral   Take 1 mg by mouth at bedtime.           Marland Kitchen omeprazole  (PRILOSEC) 40 MG capsule   Oral   Take 40 mg by mouth daily.         . potassium chloride (KLOR-CON) 10 MEQ CR tablet   Oral   Take 10 mEq by mouth daily.           . predniSONE (DELTASONE) 20 MG tablet   Oral   Take 1 tablet (20 mg total) by mouth 2 (two) times daily.   10 tablet   0   . sodium chloride (MURO 128) 2 % ophthalmic solution   Both Eyes   Place 2 drops into both eyes 4 (four) times daily.           BP 189/85  Pulse 101  Temp(Src) 98.1 F (36.7 C) (Oral)  Resp 18  Ht 5\' 3"  (1.6 m)  Wt 145 lb (65.772 kg)  BMI 25.69 kg/m2  SpO2 98% Physical Exam  Nursing note and vitals reviewed. Constitutional: She is oriented to person, place, and time. She appears well-developed and well-nourished.  Non-toxic appearance.  HENT:  Head: Normocephalic.    Right Ear: Tympanic membrane and external ear normal.  Left Ear: Tympanic membrane and external ear normal.  No chip or loose teeth. No facial deformity. Multiple areas of bruising and soreness.  Eyes: EOM and lids are normal. Pupils are equal, round, and reactive to light.  Neck: Normal range of motion. Neck supple. Carotid bruit is not present.  Cardiovascular: Normal rate, regular rhythm, normal heart sounds, intact distal pulses and normal pulses.   Pulmonary/Chest: Breath sounds normal. No respiratory distress. She exhibits no bony tenderness and no laceration.  Abdominal: Soft. Bowel sounds are normal. There is no tenderness. There is no guarding.  Musculoskeletal: Normal range of motion.       Legs: FROM of both shoulders, elbows, wrist, and fingers.  FROM of both hips, knees, and ankles. Distal pulses wnl.  Lymphadenopathy:       Head (right side): No submandibular adenopathy present.       Head (left side): No submandibular adenopathy present.    She has no cervical adenopathy.  Neurological: She is alert and oriented to person, place, and time. She has normal strength. No cranial nerve deficit or sensory  deficit.  Skin: Skin is warm and dry.  Psychiatric: She has a normal mood and affect. Her speech is normal.    ED Course  LACERATION REPAIR Date/Time: 11/08/2013  10:23 PM Performed by: Lenox Ahr Authorized by: Lenox Ahr Consent: Verbal consent obtained. Risks and benefits: risks, benefits and alternatives were discussed Consent given by: patient Patient understanding: patient states understanding of the procedure being performed Patient identity confirmed: arm band Time out: Immediately prior to procedure a "time out" was called to verify the correct patient, procedure, equipment, support staff and site/side marked as required. Body area: lower extremity Location details: right knee Laceration length: 2.5 cm Tendon involvement: none Anesthesia: local infiltration Local anesthetic: lidocaine 1% without epinephrine Patient sedated: no Preparation: Patient was prepped and draped in the usual sterile fashion. Irrigation solution: saline Amount of cleaning: standard Skin closure: 6-0 Prolene Number of sutures: 6 Technique: simple Approximation: close Approximation difficulty: simple Dressing: bandage. Patient tolerance: Patient tolerated the procedure well with no immediate complications.   (including critical care time) Labs Review Labs Reviewed - No data to display Imaging Review No results found.  EKG Interpretation   None       MDM  No diagnosis found. **I have reviewed nursing notes, vital signs, and all appropriate lab and imaging results for this patient.*  The abrasion to the upper lip and right leg did not require sutures. The laceration to the left face/chin was repaired with 6 sutures. Tetanus up dated. Pt has hx of mitral valve problems. Pt started on cipro. She will have sutures removed in 5 to 7 days. She will return sooner if any changes or problem.  Lenox Ahr, PA-C 11/09/13 1621  Lenox Ahr, PA-C 11/24/13 [phone]

## 2013-11-08 NOTE — ED Notes (Signed)
PA at bedside.

## 2013-11-08 NOTE — ED Notes (Signed)
Laceration to left chin, right upper lip, abrasions to right knee and leg, skin tear to right arm

## 2013-11-08 NOTE — Discharge Instructions (Signed)
Your CT scans are negative for fracture or dislocation.  Please keep the laceration to your face clean and dry. Please have sutures removed in 5-7 days. Please use Cipro daily for prevention of infection. Please see Dr. Luan Pulling, or return to the emergency department or problems. He Sutured Wound Care Sutures are stitches that can be used to close wounds. Wound care helps prevent pain and infection.  HOME CARE INSTRUCTIONS   Rest and elevate the injured area until all the pain and swelling are gone.  Only take over-the-counter or prescription medicines for pain, discomfort, or fever as directed by your caregiver.  After 48 hours, gently wash the area with mild soap and water once a day, or as directed. Rinse off the soap. Pat the area dry with a clean towel. Do not rub the wound. This may cause bleeding.  Follow your caregiver's instructions for how often to change the bandage (dressing). Stop using a dressing after 2 days or after the wound stops draining.  If the dressing sticks, moisten it with soapy water and gently remove it.  Apply ointment on the wound as directed.  Avoid stretching a sutured wound.  Drink enough fluids to keep your urine clear or pale yellow.  Follow up with your caregiver for suture removal as directed.  Use sunscreen on your wound for the next 3 to 6 months so the scar will not darken. SEEK IMMEDIATE MEDICAL CARE IF:   Your wound becomes red, swollen, hot, or tender.  You have increasing pain in the wound.  You have a red streak that extends from the wound.  There is pus coming from the wound.  You have a fever.  You have shaking chills.  There is a bad smell coming from the wound.  You have persistent bleeding from the wound. MAKE SURE YOU:   Understand these instructions.  Will watch your condition.  Will get help right away if you are not doing well or get worse. Document Released: 11/16/2004 Document Revised: 01/01/2012 Document  Reviewed: 02/12/2011 [facility] Patient Information 2014 [place], [place]. And we'll Abrasion An abrasion is a cut or scrape of the skin. Abrasions do not extend through all layers of the skin and most heal within 10 days. It is important to care for your abrasion properly to prevent infection. CAUSES  Most abrasions are caused by falling on, or gliding across, the ground or other surface. When your skin rubs on something, the outer and inner layer of skin rubs off, causing an abrasion. DIAGNOSIS  Your caregiver will be able to diagnose an abrasion during a physical exam.  TREATMENT  Your treatment depends on how large and deep the abrasion is. Generally, your abrasion will be cleaned with water and a mild soap to remove any dirt or debris. An antibiotic ointment may be put over the abrasion to prevent an infection. A bandage (dressing) may be wrapped around the abrasion to keep it from getting dirty.  You may need a tetanus shot if:  You cannot remember when you had your last tetanus shot.  You have never had a tetanus shot.  The injury broke your skin. If you get a tetanus shot, your arm may swell, get red, and feel warm to the touch. This is common and not a problem. If you need a tetanus shot and you choose not to have one, there is a rare chance of getting tetanus. Sickness from tetanus can be serious.  HOME CARE INSTRUCTIONS   If a dressing  was applied, change it at least once a day or as directed by your caregiver. If the bandage sticks, soak it off with warm water.   Wash the area with water and a mild soap to remove all the ointment 2 times a day. Rinse off the soap and pat the area dry with a clean towel.   Reapply any ointment as directed by your caregiver. This will help prevent infection and keep the bandage from sticking. Use gauze over the wound and under the dressing to help keep the bandage from sticking.   Change your dressing right away if it becomes wet or dirty.    Only take over-the-counter or prescription medicines for pain, discomfort, or fever as directed by your caregiver.   Follow up with your caregiver within 24 48 hours for a wound check, or as directed. If you were not given a wound-check appointment, look closely at your abrasion for redness, swelling, or pus. These are signs of infection. SEEK IMMEDIATE MEDICAL CARE IF:   You have increasing pain in the wound.   You have redness, swelling, or tenderness around the wound.   You have pus coming from the wound.   You have a fever or persistent symptoms for more than 2 3 days.  You have a fever and your symptoms suddenly get worse.  You have a bad smell coming from the wound or dressing.  MAKE SURE YOU:   Understand these instructions.  Will watch your condition.  Will get help right away if you are not doing well or get worse. Document Released: 07/19/2005 Document Revised: 09/25/2012 Document Reviewed: 09/12/2011 [facility] Patient Information 2014 [place], [place].

## 2013-11-10 NOTE — ED Provider Notes (Signed)
Medical screening examination/treatment/procedure(s) were performed by non-physician practitioner and as supervising physician I was immediately available for consultation/collaboration.  EKG Interpretation   None        Alexsia Klindt, MD 11/10/13 2229 

## 2013-11-14 DIAGNOSIS — S0190XA Unspecified open wound of unspecified part of head, initial encounter: Secondary | ICD-10-CM | POA: Diagnosis not present

## 2013-11-19 ENCOUNTER — Encounter (INDEPENDENT_AMBULATORY_CARE_PROVIDER_SITE_OTHER): Payer: Self-pay | Admitting: *Deleted

## 2013-11-25 NOTE — ED Provider Notes (Signed)
Medical screening examination/treatment/procedure(s) were performed by non-physician practitioner and as supervising physician I was immediately available for consultation/collaboration.  EKG Interpretation   None        Virgel Manifold, MD 11/25/13 [phone]

## 2013-12-01 DIAGNOSIS — J449 Chronic obstructive pulmonary disease, unspecified: Secondary | ICD-10-CM | POA: Diagnosis not present

## 2013-12-01 DIAGNOSIS — J441 Chronic obstructive pulmonary disease with (acute) exacerbation: Secondary | ICD-10-CM | POA: Diagnosis not present

## 2013-12-01 DIAGNOSIS — I10 Essential (primary) hypertension: Secondary | ICD-10-CM | POA: Diagnosis not present

## 2013-12-01 DIAGNOSIS — J45901 Unspecified asthma with (acute) exacerbation: Secondary | ICD-10-CM | POA: Diagnosis not present

## 2013-12-01 DIAGNOSIS — E785 Hyperlipidemia, unspecified: Secondary | ICD-10-CM | POA: Diagnosis not present

## 2014-01-01 DIAGNOSIS — J449 Chronic obstructive pulmonary disease, unspecified: Secondary | ICD-10-CM | POA: Diagnosis not present

## 2014-01-01 DIAGNOSIS — I10 Essential (primary) hypertension: Secondary | ICD-10-CM | POA: Diagnosis not present

## 2014-01-01 DIAGNOSIS — J019 Acute sinusitis, unspecified: Secondary | ICD-10-CM | POA: Diagnosis not present

## 2014-01-14 ENCOUNTER — Encounter (INDEPENDENT_AMBULATORY_CARE_PROVIDER_SITE_OTHER): Payer: Self-pay | Admitting: *Deleted

## 2014-01-14 ENCOUNTER — Other Ambulatory Visit (INDEPENDENT_AMBULATORY_CARE_PROVIDER_SITE_OTHER): Payer: Self-pay | Admitting: *Deleted

## 2014-01-14 DIAGNOSIS — R7401 Elevation of levels of liver transaminase levels: Secondary | ICD-10-CM

## 2014-01-14 DIAGNOSIS — R740 Nonspecific elevation of levels of transaminase and lactic acid dehydrogenase [LDH]: Principal | ICD-10-CM

## 2014-02-06 DIAGNOSIS — R7401 Elevation of levels of liver transaminase levels: Secondary | ICD-10-CM | POA: Diagnosis not present

## 2014-02-06 LAB — HEPATIC FUNCTION PANEL
ALK PHOS: 77 U/L (ref 39–117)
ALT: 12 U/L (ref 0–35)
AST: 18 U/L (ref 0–37)
Albumin: 3.8 g/dL (ref 3.5–5.2)
BILIRUBIN DIRECT: 0.1 mg/dL (ref 0.0–0.3)
BILIRUBIN INDIRECT: 0.3 mg/dL (ref 0.2–1.2)
BILIRUBIN TOTAL: 0.4 mg/dL (ref 0.2–1.2)
Total Protein: 7 g/dL (ref 6.0–8.3)

## 2014-02-09 ENCOUNTER — Encounter (INDEPENDENT_AMBULATORY_CARE_PROVIDER_SITE_OTHER): Payer: Self-pay | Admitting: Internal Medicine

## 2014-02-09 ENCOUNTER — Ambulatory Visit (INDEPENDENT_AMBULATORY_CARE_PROVIDER_SITE_OTHER): Payer: Medicare Other | Admitting: Internal Medicine

## 2014-02-09 VITALS — BP 120/70 | HR 76 | Temp 98.4°F | Ht 63.5 in | Wt 144.2 lb

## 2014-02-09 DIAGNOSIS — R7402 Elevation of levels of lactic acid dehydrogenase (LDH): Secondary | ICD-10-CM | POA: Diagnosis not present

## 2014-02-09 DIAGNOSIS — R740 Nonspecific elevation of levels of transaminase and lactic acid dehydrogenase [LDH]: Principal | ICD-10-CM

## 2014-02-09 DIAGNOSIS — R7401 Elevation of levels of liver transaminase levels: Secondary | ICD-10-CM

## 2014-02-09 NOTE — Patient Instructions (Signed)
OV in 1 yr. With an Hepatic function

## 2014-02-09 NOTE — Progress Notes (Signed)
Subjective:     Patient ID: Stephanie West, female   DOB: [birth year], 73 y.o.   MRN: [id number]  HPI Here today for f/u of her elevated transaminases since 2011. It was thought that statin bumped her transminases up so she came off them. Transaminases remained elevated. Viral markers for Hep B and C were negative. Sed rate was negative.  Aciphex was eventually stopped and her transaminases normalized.   [place] in 2011 was normal.  Recent hepatic function was normal.  Her appetite has remained good. No weight loss. No abdominal pain. No dysphagia. Occasionally has nausea in the morning. Once she belches, nausea is relieved. Usually has a BM daily. No melena or bright red rectal bleeiding.   Hepatic Function Panel     Component Value Date/Time   PROT 7.0 02/06/2014 0934   ALBUMIN 3.8 02/06/2014 0934   AST 18 02/06/2014 0934   ALT 12 02/06/2014 0934   ALKPHOS 77 02/06/2014 0934   BILITOT 0.4 02/06/2014 0934   BILIDIR 0.1 02/06/2014 0934   IBILI 0.3 02/06/2014 0934   03/07/2013 EGD/ED dysphagia: Dr. Laural Golden:  Impression:  Noncritical Schatzki's ring along with moderate-sized sliding-type hernia.  Esophagus dilated by passing 38 French Maloney dilator resulting in mucosal disruption the cervical esophagus indicative of a focal web.  Schatzki's ring was disrupted with focal biopsy.  Recommendations:     Review of Systems Past Medical History  Diagnosis Date  . Hypertension   . Asthma   . Elevated transaminase level   . GERD (gastroesophageal reflux disease)   . MVP (mitral valve prolapse)     Past Surgical History  Procedure Laterality Date  . Laparoscopic tubal ligation  1984  . Colonoscopy    . Upper gastrointestinal endoscopy    . Esophagogastroduodenoscopy (egd) with esophageal dilation N/A 03/07/2013    Procedure: ESOPHAGOGASTRODUODENOSCOPY (EGD) WITH ESOPHAGEAL DILATION;  Surgeon: Rogene Houston, MD;  Location: [facility];  Service: Endoscopy;  Laterality: N/A;  830     Allergies  Allergen Reactions  . Aspirin   . Avelox [Moxifloxacin Hcl In Nacl]   . Bee Venom   . Benadryl [Diphenhydramine Hcl]   . Chicken Allergy   . Codeine     Made her crazy  . Factive [Gemifloxacin Mesylate]   . Factive [Gemifloxacin]   . Levaquin [Levofloxacin In D5w]   . Levofloxacin     She is also allergic to Xray dyes.  Marland Kitchen Penicillins   . Sulfa Antibiotics   . Tequin [Gatifloxacin]     Current Outpatient Prescriptions on File Prior to Visit  Medication Sig Dispense Refill  . albuterol (PROVENTIL) (2.5 MG/3ML) 0.083% nebulizer solution Take 2.5 mg by nebulization daily as needed for wheezing or shortness of breath.       Marland Kitchen aliskiren (TEKTURNA) 150 MG tablet Take 150 mg by mouth at bedtime.       . Carboxymethylcellulose Sodium (THERATEARS) 0.25 % SOLN Apply to eye. 1 drop in each eye daily       . colesevelam (WELCHOL) 625 MG tablet Take 1,875 mg by mouth 2 (two) times daily with a meal.       . diltiazem (CARDIZEM CD) 180 MG 24 hr capsule Take 180 mg by mouth at bedtime.       Marland Kitchen ezetimibe (ZETIA) 10 MG tablet Take 10 mg by mouth daily.      . fish oil-omega-3 fatty acids 1000 MG capsule Take 1 g by mouth daily.        Marland Kitchen  fluticasone (FLONASE) 50 MCG/ACT nasal spray Place 2 sprays into the nose daily.      . Fluticasone-Salmeterol (ADVAIR) 250-50 MCG/DOSE AEPB Inhale 1 puff into the lungs every 12 (twelve) hours.       Marland Kitchen guanFACINE (TENEX) 1 MG tablet Take 1 mg by mouth at bedtime.        Marland Kitchen omeprazole (PRILOSEC) 40 MG capsule Take 40 mg by mouth daily.      . potassium chloride (KLOR-CON) 10 MEQ CR tablet Take 10 mEq by mouth daily.        . sodium chloride (MURO 128) 2 % ophthalmic solution Place 2 drops into both eyes 4 (four) times daily.        No current facility-administered medications on file prior to visit.  Married. 4 step children. One has DM and chronic kidney disease.      Objective:   Physical Exam  Filed Vitals:   02/09/14 1425  BP: 120/70   Pulse: 76  Temp: 98.4 F (36.9 C)  Height: 5' 3.5" (1.613 m)  Weight: 144 lb 3.2 oz (65.409 kg)  Alert and oriented. Skin warm and dry. Oral mucosa is moist.   . Sclera anicteric, conjunctivae is pink. Thyroid not enlarged. No cervical lymphadenopathy. Lungs clear. Heart regular rate and rhythm.  Abdomen is soft. Bowel sounds are positive. No hepatomegaly. No abdominal masses felt. No tenderness.  No edema to lower extremities.        Assessment:    Elevated transaminases. Transaminases are normal. No GI symptoms.    Plan:     OV in 1 yr with an Hepatic function.

## 2014-02-25 DIAGNOSIS — J449 Chronic obstructive pulmonary disease, unspecified: Secondary | ICD-10-CM | POA: Diagnosis not present

## 2014-02-25 DIAGNOSIS — J019 Acute sinusitis, unspecified: Secondary | ICD-10-CM | POA: Diagnosis not present

## 2014-02-25 DIAGNOSIS — J441 Chronic obstructive pulmonary disease with (acute) exacerbation: Secondary | ICD-10-CM | POA: Diagnosis not present

## 2014-02-25 DIAGNOSIS — J45901 Unspecified asthma with (acute) exacerbation: Secondary | ICD-10-CM | POA: Diagnosis not present

## 2014-02-25 DIAGNOSIS — I10 Essential (primary) hypertension: Secondary | ICD-10-CM | POA: Diagnosis not present

## 2014-03-02 DIAGNOSIS — J019 Acute sinusitis, unspecified: Secondary | ICD-10-CM | POA: Diagnosis not present

## 2014-03-02 DIAGNOSIS — I10 Essential (primary) hypertension: Secondary | ICD-10-CM | POA: Diagnosis not present

## 2014-03-02 DIAGNOSIS — K2100 Gastro-esophageal reflux disease with esophagitis, without bleeding: Secondary | ICD-10-CM | POA: Diagnosis not present

## 2014-03-19 DIAGNOSIS — H2510 Age-related nuclear cataract, unspecified eye: Secondary | ICD-10-CM | POA: Diagnosis not present

## 2014-03-19 DIAGNOSIS — H2589 Other age-related cataract: Secondary | ICD-10-CM | POA: Diagnosis not present

## 2014-03-19 DIAGNOSIS — H18519 Endothelial corneal dystrophy, unspecified eye: Secondary | ICD-10-CM | POA: Diagnosis not present

## 2014-03-19 DIAGNOSIS — H5200 Hypermetropia, unspecified eye: Secondary | ICD-10-CM | POA: Diagnosis not present

## 2014-05-28 DIAGNOSIS — J019 Acute sinusitis, unspecified: Secondary | ICD-10-CM | POA: Diagnosis not present

## 2014-05-28 DIAGNOSIS — F411 Generalized anxiety disorder: Secondary | ICD-10-CM | POA: Diagnosis not present

## 2014-05-28 DIAGNOSIS — J441 Chronic obstructive pulmonary disease with (acute) exacerbation: Secondary | ICD-10-CM | POA: Diagnosis not present

## 2014-05-28 DIAGNOSIS — J449 Chronic obstructive pulmonary disease, unspecified: Secondary | ICD-10-CM | POA: Diagnosis not present

## 2014-11-06 ENCOUNTER — Encounter (INDEPENDENT_AMBULATORY_CARE_PROVIDER_SITE_OTHER): Payer: Self-pay | Admitting: *Deleted

## 2015-01-06 DIAGNOSIS — H10402 Unspecified chronic conjunctivitis, left eye: Secondary | ICD-10-CM | POA: Diagnosis not present

## 2015-01-06 DIAGNOSIS — J45909 Unspecified asthma, uncomplicated: Secondary | ICD-10-CM | POA: Diagnosis not present

## 2015-01-14 DIAGNOSIS — J449 Chronic obstructive pulmonary disease, unspecified: Secondary | ICD-10-CM | POA: Diagnosis not present

## 2015-01-14 DIAGNOSIS — I10 Essential (primary) hypertension: Secondary | ICD-10-CM | POA: Diagnosis not present

## 2015-01-14 DIAGNOSIS — J329 Chronic sinusitis, unspecified: Secondary | ICD-10-CM | POA: Diagnosis not present

## 2015-01-14 DIAGNOSIS — F419 Anxiety disorder, unspecified: Secondary | ICD-10-CM | POA: Diagnosis not present

## 2015-02-16 ENCOUNTER — Encounter (INDEPENDENT_AMBULATORY_CARE_PROVIDER_SITE_OTHER): Payer: Self-pay | Admitting: *Deleted

## 2015-02-16 ENCOUNTER — Encounter (INDEPENDENT_AMBULATORY_CARE_PROVIDER_SITE_OTHER): Payer: Self-pay | Admitting: Internal Medicine

## 2015-02-16 ENCOUNTER — Ambulatory Visit (INDEPENDENT_AMBULATORY_CARE_PROVIDER_SITE_OTHER): Payer: Medicare Other | Admitting: Internal Medicine

## 2015-02-16 VITALS — BP 120/70 | HR 72 | Temp 98.3°F | Resp 18 | Ht 63.5 in | Wt 143.5 lb

## 2015-02-16 DIAGNOSIS — R1310 Dysphagia, unspecified: Secondary | ICD-10-CM | POA: Diagnosis not present

## 2015-02-16 DIAGNOSIS — K219 Gastro-esophageal reflux disease without esophagitis: Secondary | ICD-10-CM | POA: Diagnosis not present

## 2015-02-16 NOTE — Patient Instructions (Signed)
Physician will call with results of barium study when completed. 

## 2015-02-16 NOTE — Progress Notes (Signed)
Presenting complaint;  Follow-up for GERD. History of elevated transaminases. Patient complains of dysphagia with pills.  Subjective:  Stephanie West is 74 year old Caucasian female who presents for yearly visit. She states she is doing well. Heartburn is well controlled with current dose of omeprazole. She did not do well with 20 mg of omeprazole daily. She denies sore throat chronic cough or hoarseness but lately she has noted dysphagia with big pills. She has most difficulty with welchol and multivitamin. She is not having any difficulty with solids or liquids. She denies abdominal pain. Bowels usually move daily. She may use MiraLAX once a month or so. It is not listed in her medications. She stays busy. She tried some walk at least a mile a day.   Current Medications: Outpatient Encounter Prescriptions as of 02/16/2015  Medication Sig  . albuterol (PROVENTIL) (2.5 MG/3ML) 0.083% nebulizer solution Take 2.5 mg by nebulization daily as needed for wheezing or shortness of breath.   Marland Kitchen aliskiren (TEKTURNA) 150 MG tablet Take 150 mg by mouth at bedtime.   . Calcium-Vitamin D-Vitamin K (CALCIUM SOFT CHEWS PO) Take 1,000 mg by mouth.  . cholecalciferol (VITAMIN D) 1000 UNITS tablet Take 1,000 Units by mouth daily.   . colesevelam (WELCHOL) 625 MG tablet Take 625 mg by mouth. Patient takes 2 with each meal - equals 6 per day.  . Cyanocobalamin (VITAMIN B 12 PO) Take 2,000 mcg by mouth daily.  Marland Kitchen diltiazem (CARDIZEM CD) 180 MG 24 hr capsule Take 180 mg by mouth at bedtime.   Marland Kitchen ezetimibe (ZETIA) 10 MG tablet Take 10 mg by mouth daily.  . fish oil-omega-3 fatty acids 1000 MG capsule Take 1 g by mouth 2 (two) times daily.   . fluticasone (FLONASE) 50 MCG/ACT nasal spray Place 2 sprays into the nose daily.  . Fluticasone-Salmeterol (ADVAIR) 250-50 MCG/DOSE AEPB Inhale 1 puff into the lungs every 12 (twelve) hours.   Marland Kitchen guanFACINE (TENEX) 1 MG tablet Take 1 mg by mouth at bedtime.    .  multivitamin-iron-minerals-folic acid (CENTRUM) chewable tablet Chew 1 tablet by mouth daily.  Marland Kitchen omeprazole (PRILOSEC) 40 MG capsule Take 40 mg by mouth daily.  . potassium chloride (KLOR-CON) 10 MEQ CR tablet Take 10 mEq by mouth daily.    . sodium chloride (MURO 128) 2 % ophthalmic solution Place 2 drops into both eyes 4 (four) times daily.   . [DISCONTINUED] Carboxymethylcellulose Sodium (THERATEARS) 0.25 % SOLN Apply to eye. 1 drop in each eye daily      Objective: Blood pressure 120/70, pulse 72, temperature 98.3 F (36.8 C), temperature source Oral, resp. rate 18, height 5' 3.5" (1.613 m), weight 143 lb 8 oz (65.091 kg). Patient is alert and in no acute distress. Conjunctiva is pink. Sclera is nonicteric Oropharyngeal mucosa is normal. No neck masses or thyromegaly noted. Cardiac exam with regular rhythm normal S1 and S2. No murmur or gallop noted. Lungs are clear to auscultation. Abdomen is symmetrical soft and nontender without organomegaly or masses.  No LE edema or clubbing noted.  Labs/studies Results: LFTs from 02/06/2014  Bilirubin 0.4, AP 77, AST 18, ALT 12, total protein 7.0 and albumin 3.8   AST and ALT were 19 and 14 on 02/10/2013   Assessment:  #1. Chronic GERD. She has moderate size hiatal hernia. She is requiring 40 mg of omeprazole daily to control her heartburn. #2. Pill dysphagia. He is having no difficulty with solids. She has history of Schatzki's ring and has undergone multiple dilations in the  past. She needs to be evaluated to make sure ring has not recurred. #3. History of elevated transaminases secondary to AcipHex. Transaminases normalized off medication and have remained so for 3 years.  Plan:  Barium pill esophagogram. Continue omeprazole at 40 mg by mouth every morning. Physician visit in one year.

## 2015-02-22 ENCOUNTER — Ambulatory Visit (HOSPITAL_COMMUNITY)
Admission: RE | Admit: 2015-02-22 | Discharge: 2015-02-22 | Disposition: A | Discharge status: Home / Self Care | Payer: Medicare Other | Source: Ambulatory Visit | Attending: Internal Medicine | Admitting: Internal Medicine

## 2015-02-22 DIAGNOSIS — K449 Diaphragmatic hernia without obstruction or gangrene: Secondary | ICD-10-CM | POA: Insufficient documentation

## 2015-02-22 DIAGNOSIS — K219 Gastro-esophageal reflux disease without esophagitis: Secondary | ICD-10-CM | POA: Diagnosis not present

## 2015-02-22 DIAGNOSIS — I10 Essential (primary) hypertension: Secondary | ICD-10-CM | POA: Diagnosis not present

## 2015-02-22 DIAGNOSIS — K224 Dyskinesia of esophagus: Secondary | ICD-10-CM | POA: Diagnosis not present

## 2015-02-22 DIAGNOSIS — R1310 Dysphagia, unspecified: Secondary | ICD-10-CM | POA: Insufficient documentation

## 2015-02-22 DIAGNOSIS — J45909 Unspecified asthma, uncomplicated: Secondary | ICD-10-CM | POA: Insufficient documentation

## 2015-02-22 DIAGNOSIS — K222 Esophageal obstruction: Secondary | ICD-10-CM | POA: Insufficient documentation

## 2015-03-08 ENCOUNTER — Encounter (INDEPENDENT_AMBULATORY_CARE_PROVIDER_SITE_OTHER): Payer: Self-pay | Admitting: *Deleted

## 2015-03-08 ENCOUNTER — Other Ambulatory Visit (INDEPENDENT_AMBULATORY_CARE_PROVIDER_SITE_OTHER): Payer: Self-pay | Admitting: *Deleted

## 2015-03-08 DIAGNOSIS — R1310 Dysphagia, unspecified: Secondary | ICD-10-CM

## 2015-04-15 ENCOUNTER — Encounter (HOSPITAL_COMMUNITY): Payer: Self-pay | Admitting: *Deleted

## 2015-04-15 ENCOUNTER — Ambulatory Visit (HOSPITAL_COMMUNITY)
Admission: RE | Admit: 2015-04-15 | Discharge: 2015-04-15 | Disposition: A | Discharge status: Home / Self Care | Payer: Medicare Other | Source: Ambulatory Visit | Attending: Internal Medicine | Admitting: Internal Medicine

## 2015-04-15 ENCOUNTER — Encounter (HOSPITAL_COMMUNITY)
Admission: RE | Disposition: A | Discharge status: Home / Self Care | Payer: Self-pay | Source: Ambulatory Visit | Attending: Internal Medicine

## 2015-04-15 DIAGNOSIS — Z885 Allergy status to narcotic agent status: Secondary | ICD-10-CM | POA: Insufficient documentation

## 2015-04-15 DIAGNOSIS — J45909 Unspecified asthma, uncomplicated: Secondary | ICD-10-CM | POA: Diagnosis not present

## 2015-04-15 DIAGNOSIS — R1310 Dysphagia, unspecified: Secondary | ICD-10-CM

## 2015-04-15 DIAGNOSIS — Z79899 Other long term (current) drug therapy: Secondary | ICD-10-CM | POA: Diagnosis not present

## 2015-04-15 DIAGNOSIS — Z882 Allergy status to sulfonamides status: Secondary | ICD-10-CM | POA: Diagnosis not present

## 2015-04-15 DIAGNOSIS — Z91030 Bee allergy status: Secondary | ICD-10-CM | POA: Insufficient documentation

## 2015-04-15 DIAGNOSIS — K219 Gastro-esophageal reflux disease without esophagitis: Secondary | ICD-10-CM | POA: Insufficient documentation

## 2015-04-15 DIAGNOSIS — I341 Nonrheumatic mitral (valve) prolapse: Secondary | ICD-10-CM | POA: Diagnosis not present

## 2015-04-15 DIAGNOSIS — K222 Esophageal obstruction: Secondary | ICD-10-CM | POA: Diagnosis not present

## 2015-04-15 DIAGNOSIS — I10 Essential (primary) hypertension: Secondary | ICD-10-CM | POA: Diagnosis not present

## 2015-04-15 DIAGNOSIS — Z886 Allergy status to analgesic agent status: Secondary | ICD-10-CM | POA: Insufficient documentation

## 2015-04-15 DIAGNOSIS — K449 Diaphragmatic hernia without obstruction or gangrene: Secondary | ICD-10-CM | POA: Insufficient documentation

## 2015-04-15 DIAGNOSIS — Z888 Allergy status to other drugs, medicaments and biological substances status: Secondary | ICD-10-CM | POA: Diagnosis not present

## 2015-04-15 HISTORY — PX: ESOPHAGOGASTRODUODENOSCOPY: SHX5428

## 2015-04-15 SURGERY — EGD (ESOPHAGOGASTRODUODENOSCOPY)
Anesthesia: Moderate Sedation

## 2015-04-15 MED ORDER — MEPERIDINE HCL 50 MG/ML IJ SOLN
INTRAMUSCULAR | Status: DC | PRN
  Administered 2015-04-15 (×2): 25 mg via INTRAVENOUS

## 2015-04-15 MED ORDER — BUTAMBEN-TETRACAINE-BENZOCAINE 2-2-14 % EX AERO
INHALATION_SPRAY | CUTANEOUS | Status: DC | PRN
  Administered 2015-04-15: 2 via TOPICAL

## 2015-04-15 MED ORDER — MEPERIDINE HCL 50 MG/ML IJ SOLN
INTRAMUSCULAR | Status: AC
  Filled 2015-04-15: qty 1

## 2015-04-15 MED ORDER — STERILE WATER FOR IRRIGATION IR SOLN
Status: DC | PRN
  Administered 2015-04-15: 14:00:00

## 2015-04-15 MED ORDER — MIDAZOLAM HCL 5 MG/5ML IJ SOLN
INTRAMUSCULAR | Status: DC
Start: 2015-04-15 — End: 2015-04-15
  Filled 2015-04-15: qty 10

## 2015-04-15 MED ORDER — SODIUM CHLORIDE 0.9 % IV SOLN
INTRAVENOUS | Status: DC
  Administered 2015-04-15: 12:00:00 via INTRAVENOUS

## 2015-04-15 MED ORDER — MIDAZOLAM HCL 5 MG/5ML IJ SOLN
INTRAMUSCULAR | Status: DC | PRN
  Administered 2015-04-15 (×2): 2 mg via INTRAVENOUS

## 2015-04-15 NOTE — Discharge Instructions (Signed)
Resume usual medications and diet. Remember to chew food thoroughly and eats slowly. No driving for 24 hours. Please call office with progress report next week.  Esophagogastroduodenoscopy Care After Refer to this sheet in the next few weeks. These instructions provide you with information on caring for yourself after your procedure. Your caregiver may also give you more specific instructions. Your treatment has been planned according to current medical practices, but problems sometimes occur. Call your caregiver if you have any problems or questions after your procedure.  HOME CARE INSTRUCTIONS  Do not eat or drink anything until the numbing medicine (local anesthetic) has worn off and your gag reflex has returned. You will know that the local anesthetic has worn off when you can swallow comfortably.  Do not drive for 12 hours after the procedure or as directed by your caregiver.  Only take medicines as directed by your caregiver. SEEK MEDICAL CARE IF:   You cannot stop coughing.  You are not urinating at all or less than usual. SEEK IMMEDIATE MEDICAL CARE IF:  You have difficulty swallowing.  You cannot eat or drink.  You have worsening throat or chest pain.  You have dizziness, lightheadedness, or you faint.  You have nausea or vomiting.  You have chills.  You have a fever.  You have severe abdominal pain.  You have black, tarry, or bloody stools. Document Released: 09/25/2012 Document Reviewed: 09/25/2012 [facility] Patient Information 2015 [place]. This information is not intended to replace advice given to you by your health care provider. Make sure you discuss any questions you have with your health care provider.

## 2015-04-15 NOTE — H&P (Signed)
Stephanie West is an 74 y.o. female.   Chief Complaint: Patient is here for EGD and ED. HPI: Patient is 74 year old Caucasian female was chronic GERD with satisfactory control of heartburn who was evaluated few weeks ago for solid food dysphagia. She had barium pill esophagogram which revealed narrowing at GE junction obstructing passage of barium pill. She is also noted to have large hiatal hernia. Last EGD was in May 2014. She denies nausea vomiting abdominal pain melena or rectal bleeding.  Past Medical History  Diagnosis Date  . Hypertension   . Asthma   . Elevated transaminase level   . GERD (gastroesophageal reflux disease)   . MVP (mitral valve prolapse)     Past Surgical History  Procedure Laterality Date  . Laparoscopic tubal ligation  1984  . Colonoscopy    . Upper gastrointestinal endoscopy    . Esophagogastroduodenoscopy (egd) with esophageal dilation N/A 03/07/2013    Procedure: ESOPHAGOGASTRODUODENOSCOPY (EGD) WITH ESOPHAGEAL DILATION;  Surgeon: Rogene Houston, MD;  Location: [facility];  Service: Endoscopy;  Laterality: N/A;  46    Family History  Problem Relation Age of Onset  . Hypertension Mother   . Prostate cancer Father    Social History:  reports that she has never smoked. She has never used smokeless tobacco. She reports that she does not drink alcohol or use illicit drugs.  Allergies:  Allergies  Allergen Reactions  . Aspirin   . Avelox [Moxifloxacin Hcl In Nacl]   . Bee Venom   . Benadryl [Diphenhydramine Hcl]   . Chicken Allergy   . Codeine     Made her crazy  . Factive [Gemifloxacin Mesylate]   . Factive [Gemifloxacin]   . Levaquin [Levofloxacin In D5w]   . Levofloxacin     She is also allergic to Xray dyes.  Marland Kitchen Penicillins   . Sulfa Antibiotics   . Tequin [Gatifloxacin]     Medications Prior to Admission  Medication Sig Dispense Refill  . aliskiren (TEKTURNA) 150 MG tablet Take 150 mg by mouth at bedtime.     . Calcium-Vitamin  D-Vitamin K (CALCIUM SOFT CHEWS PO) Take 1,000 mg by mouth.    . cholecalciferol (VITAMIN D) 1000 UNITS tablet Take 1,000 Units by mouth daily.     . colesevelam (WELCHOL) 625 MG tablet Take 1,250 mg by mouth 3 (three) times daily as needed. Patient takes 2 with each meal - equals 6 per day.    . Cyanocobalamin (VITAMIN B 12 PO) Take 2,000 mcg by mouth daily.    Marland Kitchen diltiazem (CARDIZEM CD) 180 MG 24 hr capsule Take 180 mg by mouth at bedtime.     Marland Kitchen ezetimibe (ZETIA) 10 MG tablet Take 10 mg by mouth daily.    . fish oil-omega-3 fatty acids 1000 MG capsule Take 1 g by mouth 2 (two) times daily.     . Fluticasone-Salmeterol (ADVAIR) 250-50 MCG/DOSE AEPB Inhale 1 puff into the lungs every 12 (twelve) hours.     Marland Kitchen guanFACINE (TENEX) 1 MG tablet Take 1 mg by mouth at bedtime.      . multivitamin-iron-minerals-folic acid (CENTRUM) chewable tablet Chew 1 tablet by mouth daily.    Marland Kitchen omeprazole (PRILOSEC) 40 MG capsule Take 40 mg by mouth daily.    . potassium chloride (KLOR-CON) 10 MEQ CR tablet Take 10 mEq by mouth daily.      . sodium chloride (MURO 128) 2 % ophthalmic solution Place 2 drops into both eyes 4 (four) times daily.     Marland Kitchen  albuterol (PROVENTIL) (2.5 MG/3ML) 0.083% nebulizer solution Take 2.5 mg by nebulization daily as needed for wheezing or shortness of breath.     . fluticasone (FLONASE) 50 MCG/ACT nasal spray Place 2 sprays into the nose daily.      No results found for this or any previous visit (from the past 48 hour(s)). No results found.  ROS  Blood pressure 206/103, pulse 91, temperature 97.7 F (36.5 C), temperature source Oral, resp. rate 18, height 5' 3.5" (1.613 m), weight 143 lb (64.864 kg), SpO2 98 %. Physical Exam  Constitutional: She appears well-developed and well-nourished.  HENT:  Mouth/Throat: Oropharynx is clear and moist.  Eyes: Conjunctivae are normal. No scleral icterus.  Neck: No thyromegaly present.  Cardiovascular: Normal rate, regular rhythm and normal  heart sounds.   No murmur heard. Respiratory: Effort normal and breath sounds normal.  GI: Soft. She exhibits no distension and no mass. There is no tenderness.  Musculoskeletal: She exhibits no edema.  Lymphadenopathy:    She has no cervical adenopathy.  Neurological: She is alert.  Skin: Skin is warm and dry.     Assessment/Plan Solid food dysphagia in patient with chronic GERD. Abnormal barium pill esophagogram. EGD with ED.  REHMAN,NAJEEB U 04/15/2015, 1:46 PM

## 2015-04-15 NOTE — Op Note (Signed)
EGD PROCEDURE REPORT  PATIENT:  Stephanie West  MR#:  [id number] Birthdate:  02/18/1941, 74 y.o., female Endoscopist:  Dr. Rogene Houston, MD  Procedure Date: 04/15/2015  Procedure:   EGD  Indications:  Patient is 74 year old Caucasian female who has chronic GERD and now presents with solid food dysphagia. She had barium pill study last month revealing stricture at GE junction obstructing procedure barium pill. This study also suggested motility disorder and revealed moderate-sized sliding hiatal hernia.            Informed Consent:  The risks, benefits, alternatives & imponderables which include, but are not limited to, bleeding, infection, perforation, drug reaction and potential missed lesion have been reviewed.  The potential for biopsy, lesion removal, esophageal dilation, etc. have also been discussed.  Questions have been answered.  All parties agreeable.  Please see history & physical in medical record for more information.  Medications:  Demerol 50 mg IV Versed 4 mg IV Cetacaine spray topically for oropharyngeal anesthesia  Description of procedure:  The endoscope was introduced through the mouth and advanced to the second portion of the duodenum without difficulty or limitations. The mucosal surfaces were surveyed very carefully during advancement of the scope and upon withdrawal.  Findings:  Esophagus:  Tortuous body of esophagus with normal mucosa. Noncritical Schatzki's ring noted at GE junction. GEJ:  29 cm Hiatus:  35 cm Stomach:  Stomach was empty and distended very well with insufflation. Folds in the proximal stomach were normal. Examination of mucosa at gastric body, antrum, pyloric channel, angularis fundus and cardia was normal. Hernia and ring were easily seen on this view. Duodenum:  Normal bulbar and post bulbar mucosa.  Therapeutic/Diagnostic Maneuvers Performed:   Since esophagus was noted to be quite tortuous I elected not to pass Maloney dilator and disrupted  the ring with focal biopsy at three sites.  Complications:  None  Impression: Noncritical Schatzki's ring was disrupted with focal biopsy at three sites. Moderate-sized sliding hiatal hernia.  Recommendations:  Standard instructions given. Patient advised to chew fourth or early and eats slowly. Patient advised to call office with progress report next week.  Daneesha Quinteros U  04/15/2015  2:11 PM  CC: Dr. Alonza Bogus, MD & Dr. Rayne Du ref. provider found

## 2015-04-19 ENCOUNTER — Encounter (HOSPITAL_COMMUNITY): Payer: Self-pay | Admitting: Internal Medicine

## 2015-04-19 DIAGNOSIS — K210 Gastro-esophageal reflux disease with esophagitis: Secondary | ICD-10-CM | POA: Diagnosis not present

## 2015-04-19 DIAGNOSIS — J449 Chronic obstructive pulmonary disease, unspecified: Secondary | ICD-10-CM | POA: Diagnosis not present

## 2015-04-19 DIAGNOSIS — I10 Essential (primary) hypertension: Secondary | ICD-10-CM | POA: Diagnosis not present

## 2015-04-21 DIAGNOSIS — H52223 Regular astigmatism, bilateral: Secondary | ICD-10-CM | POA: Diagnosis not present

## 2015-04-21 DIAGNOSIS — H524 Presbyopia: Secondary | ICD-10-CM | POA: Diagnosis not present

## 2015-04-21 DIAGNOSIS — H40053 Ocular hypertension, bilateral: Secondary | ICD-10-CM | POA: Diagnosis not present

## 2015-04-21 DIAGNOSIS — H5203 Hypermetropia, bilateral: Secondary | ICD-10-CM | POA: Diagnosis not present

## 2015-04-22 ENCOUNTER — Telehealth (INDEPENDENT_AMBULATORY_CARE_PROVIDER_SITE_OTHER): Payer: Self-pay | Admitting: *Deleted

## 2015-04-22 NOTE — Telephone Encounter (Signed)
Patient presented to the office with a progress report. She states that she is doing very good, can swallow better and is having no problems.

## 2015-04-23 NOTE — Telephone Encounter (Signed)
Black to know patient able swallow better post dilation.

## 2015-04-28 DIAGNOSIS — H25813 Combined forms of age-related cataract, bilateral: Secondary | ICD-10-CM | POA: Diagnosis not present

## 2015-04-28 DIAGNOSIS — H1851 Endothelial corneal dystrophy: Secondary | ICD-10-CM | POA: Diagnosis not present

## 2015-06-17 DIAGNOSIS — J4540 Moderate persistent asthma, uncomplicated: Secondary | ICD-10-CM | POA: Diagnosis not present

## 2015-06-17 DIAGNOSIS — Z01818 Encounter for other preprocedural examination: Secondary | ICD-10-CM | POA: Diagnosis not present

## 2015-06-17 DIAGNOSIS — I10 Essential (primary) hypertension: Secondary | ICD-10-CM | POA: Diagnosis not present

## 2015-06-17 DIAGNOSIS — K219 Gastro-esophageal reflux disease without esophagitis: Secondary | ICD-10-CM | POA: Diagnosis not present

## 2015-06-22 DIAGNOSIS — Z882 Allergy status to sulfonamides status: Secondary | ICD-10-CM | POA: Diagnosis not present

## 2015-06-22 DIAGNOSIS — I10 Essential (primary) hypertension: Secondary | ICD-10-CM | POA: Diagnosis not present

## 2015-06-22 DIAGNOSIS — Z886 Allergy status to analgesic agent status: Secondary | ICD-10-CM | POA: Diagnosis not present

## 2015-06-22 DIAGNOSIS — Z881 Allergy status to other antibiotic agents status: Secondary | ICD-10-CM | POA: Diagnosis not present

## 2015-06-22 DIAGNOSIS — I341 Nonrheumatic mitral (valve) prolapse: Secondary | ICD-10-CM | POA: Diagnosis not present

## 2015-06-22 DIAGNOSIS — Z79899 Other long term (current) drug therapy: Secondary | ICD-10-CM | POA: Diagnosis not present

## 2015-06-22 DIAGNOSIS — K219 Gastro-esophageal reflux disease without esophagitis: Secondary | ICD-10-CM | POA: Diagnosis not present

## 2015-06-22 DIAGNOSIS — Z888 Allergy status to other drugs, medicaments and biological substances status: Secondary | ICD-10-CM | POA: Diagnosis not present

## 2015-06-22 DIAGNOSIS — Z91030 Bee allergy status: Secondary | ICD-10-CM | POA: Diagnosis not present

## 2015-06-22 DIAGNOSIS — J449 Chronic obstructive pulmonary disease, unspecified: Secondary | ICD-10-CM | POA: Diagnosis not present

## 2015-06-22 DIAGNOSIS — H2511 Age-related nuclear cataract, right eye: Secondary | ICD-10-CM | POA: Diagnosis not present

## 2015-06-22 DIAGNOSIS — H269 Unspecified cataract: Secondary | ICD-10-CM | POA: Diagnosis not present

## 2015-06-22 DIAGNOSIS — Z91041 Radiographic dye allergy status: Secondary | ICD-10-CM | POA: Diagnosis not present

## 2015-06-22 DIAGNOSIS — Z85828 Personal history of other malignant neoplasm of skin: Secondary | ICD-10-CM | POA: Diagnosis not present

## 2015-06-22 DIAGNOSIS — J45909 Unspecified asthma, uncomplicated: Secondary | ICD-10-CM | POA: Diagnosis not present

## 2015-06-22 DIAGNOSIS — H1851 Endothelial corneal dystrophy: Secondary | ICD-10-CM | POA: Diagnosis not present

## 2015-06-22 DIAGNOSIS — H9191 Unspecified hearing loss, right ear: Secondary | ICD-10-CM | POA: Diagnosis not present

## 2015-06-22 DIAGNOSIS — Z885 Allergy status to narcotic agent status: Secondary | ICD-10-CM | POA: Diagnosis not present

## 2015-06-22 DIAGNOSIS — Z880 Allergy status to penicillin: Secondary | ICD-10-CM | POA: Diagnosis not present

## 2015-07-03 ENCOUNTER — Emergency Department (HOSPITAL_COMMUNITY): Payer: Medicare Other

## 2015-07-03 ENCOUNTER — Emergency Department (HOSPITAL_COMMUNITY)
Admission: EM | Admit: 2015-07-03 | Discharge: 2015-07-03 | Disposition: A | Discharge status: Home / Self Care | Payer: Medicare Other | Attending: Emergency Medicine | Admitting: Emergency Medicine

## 2015-07-03 ENCOUNTER — Encounter (HOSPITAL_COMMUNITY): Payer: Self-pay | Admitting: Emergency Medicine

## 2015-07-03 DIAGNOSIS — K219 Gastro-esophageal reflux disease without esophagitis: Secondary | ICD-10-CM | POA: Diagnosis not present

## 2015-07-03 DIAGNOSIS — Z880 Allergy status to penicillin: Secondary | ICD-10-CM | POA: Insufficient documentation

## 2015-07-03 DIAGNOSIS — R1013 Epigastric pain: Secondary | ICD-10-CM | POA: Diagnosis not present

## 2015-07-03 DIAGNOSIS — Z79899 Other long term (current) drug therapy: Secondary | ICD-10-CM | POA: Diagnosis not present

## 2015-07-03 DIAGNOSIS — R110 Nausea: Secondary | ICD-10-CM | POA: Diagnosis not present

## 2015-07-03 DIAGNOSIS — J45909 Unspecified asthma, uncomplicated: Secondary | ICD-10-CM | POA: Insufficient documentation

## 2015-07-03 DIAGNOSIS — I10 Essential (primary) hypertension: Secondary | ICD-10-CM | POA: Insufficient documentation

## 2015-07-03 LAB — COMPREHENSIVE METABOLIC PANEL
ALK PHOS: 58 U/L (ref 38–126)
ALT: 15 U/L (ref 14–54)
ANION GAP: 8 (ref 5–15)
AST: 19 U/L (ref 15–41)
Albumin: 4.1 g/dL (ref 3.5–5.0)
BUN: 21 mg/dL — ABNORMAL HIGH (ref 6–20)
CALCIUM: 9 mg/dL (ref 8.9–10.3)
CO2: 27 mmol/L (ref 22–32)
Chloride: 100 mmol/L — ABNORMAL LOW (ref 101–111)
Creatinine, Ser: 1.17 mg/dL — ABNORMAL HIGH (ref 0.44–1.00)
GFR calc non Af Amer: 44 mL/min — ABNORMAL LOW (ref >60)
GFR, EST AFRICAN AMERICAN: 51 mL/min — AB (ref >60)
Glucose, Bld: 157 mg/dL — ABNORMAL HIGH (ref 65–99)
Potassium: 4.1 mmol/L (ref 3.5–5.1)
SODIUM: 135 mmol/L (ref 135–145)
Total Bilirubin: 0.4 mg/dL (ref 0.3–1.2)
Total Protein: 7.7 g/dL (ref 6.5–8.1)

## 2015-07-03 LAB — CBC
HCT: 41.3 % (ref 36.0–46.0)
HEMOGLOBIN: 14.2 g/dL (ref 12.0–15.0)
MCH: 33.2 pg (ref 26.0–34.0)
MCHC: 34.4 g/dL (ref 30.0–36.0)
MCV: 96.5 fL (ref 78.0–100.0)
Platelets: 232 10*3/uL (ref 150–400)
RBC: 4.28 MIL/uL (ref 3.87–5.11)
RDW: 13 % (ref 11.5–15.5)
WBC: 15 10*3/uL — AB (ref 4.0–10.5)

## 2015-07-03 LAB — LIPASE, BLOOD: Lipase: 29 U/L (ref 22–51)

## 2015-07-03 LAB — URINALYSIS, ROUTINE W REFLEX MICROSCOPIC
Bilirubin Urine: NEGATIVE
Glucose, UA: NEGATIVE mg/dL
Hgb urine dipstick: NEGATIVE
Ketones, ur: NEGATIVE mg/dL
Leukocytes, UA: NEGATIVE
NITRITE: NEGATIVE
Protein, ur: NEGATIVE mg/dL
Specific Gravity, Urine: >1.03 — ABNORMAL HIGH (ref 1.005–1.030)
UROBILINOGEN UA: 0.2 mg/dL (ref 0.0–1.0)
pH: 5.5 (ref 5.0–8.0)

## 2015-07-03 LAB — TROPONIN I

## 2015-07-03 MED ORDER — ONDANSETRON HCL 4 MG PO TABS: ORAL_TABLET | ORAL | Status: DC

## 2015-07-03 MED ORDER — PANTOPRAZOLE SODIUM 40 MG IV SOLR
40.0000 mg | Freq: Once | INTRAVENOUS | Status: AC
  Administered 2015-07-03: 40 mg via INTRAVENOUS
  Filled 2015-07-03: qty 40

## 2015-07-03 MED ORDER — ONDANSETRON HCL 4 MG/2ML IJ SOLN
4.0000 mg | Freq: Once | INTRAMUSCULAR | Status: AC
  Administered 2015-07-03: 4 mg via INTRAVENOUS
  Filled 2015-07-03: qty 2

## 2015-07-03 MED ORDER — PROMETHAZINE HCL 25 MG/ML IJ SOLN
12.5000 mg | Freq: Once | INTRAMUSCULAR | Status: AC
  Administered 2015-07-03: 12.5 mg via INTRAVENOUS
  Filled 2015-07-03: qty 1

## 2015-07-03 MED ORDER — SODIUM CHLORIDE 0.9 % IV SOLN
Freq: Once | INTRAVENOUS | Status: AC
  Administered 2015-07-03: 20:00:00 via INTRAVENOUS

## 2015-07-03 MED ORDER — ONDANSETRON 4 MG PO TBDP: 4.0000 mg | ORAL_TABLET | Freq: Three times a day (TID) | ORAL | Status: DC | PRN

## 2015-07-03 NOTE — Discharge Instructions (Signed)
Liquid only diet tonight and tomorrow. Zofran as needed for any nausea. Return here with any pain, fever, or new or worsening symptoms.  Nausea, Adult Nausea is the feeling that you have an upset stomach or have to vomit. Nausea by itself is not likely a serious concern, but it may be an early sign of more serious medical problems. As nausea gets worse, it can lead to vomiting. If vomiting develops, there is the risk of dehydration.  CAUSES   Viral infections.  Food poisoning.  Medicines.  Pregnancy.  Motion sickness.  Migraine headaches.  Emotional distress.  Severe pain from any source.  Alcohol intoxication. HOME CARE INSTRUCTIONS  Get plenty of rest.  Ask your caregiver about specific rehydration instructions.  Eat small amounts of food and sip liquids more often.  Take all medicines as told by your caregiver. SEEK MEDICAL CARE IF:  You have not improved after 2 days, or you get worse.  You have a headache. SEEK IMMEDIATE MEDICAL CARE IF:   You have a fever.  You faint.  You keep vomiting or have blood in your vomit.  You are extremely weak or dehydrated.  You have dark or bloody stools.  You have severe chest or abdominal pain. MAKE SURE YOU:  Understand these instructions.  Will watch your condition.  Will get help right away if you are not doing well or get worse. Document Released: 11/16/2004 Document Revised: 07/03/2012 Document Reviewed: 06/21/2011 [facility] Patient Information 2015 [place], [place]. This information is not intended to replace advice given to you by your health care provider. Make sure you discuss any questions you have with your health care provider.

## 2015-07-03 NOTE — ED Provider Notes (Signed)
CSN: [id number]     Arrival date & time 07/03/15  1923 History   First MD Initiated Contact with Patient 07/03/15 1930     Chief Complaint  Patient presents with  . Nausea      HPI  Patient presents for evaluation of nausea. States early morning on Thursday, 2 days ago she awakened with nausea. Has not had vomiting. She states that she "can't vomit. States that she's never been able to vomit. She denies any pain or actual discomfort just states that it is a nausea that goes away when she belches. Hasn't eaten much because of the nausea. Denies any true food intolerances of "I'm allergic to chicken". No history of biliary disease liver disease or ulcer disease.  No history of abdominal surgeries. Has had softer stools but not quite diarrhea". No fevers or chills. No chest pain or shortness of breath or sensation in her chest that she "tasted bile" candidate and she had significant nausea.  Past Medical History  Diagnosis Date  . Hypertension   . Asthma   . Elevated transaminase level   . GERD (gastroesophageal reflux disease)   . MVP (mitral valve prolapse)    Past Surgical History  Procedure Laterality Date  . Laparoscopic tubal ligation  1984  . Colonoscopy    . Upper gastrointestinal endoscopy    . Esophagogastroduodenoscopy (egd) with esophageal dilation N/A 03/07/2013    Procedure: ESOPHAGOGASTRODUODENOSCOPY (EGD) WITH ESOPHAGEAL DILATION;  Surgeon: Rogene Houston, MD;  [facility];  Service: Endoscopy;  Laterality: N/A;  830  . Esophagogastroduodenoscopy N/A 04/15/2015    Procedure: ESOPHAGOGASTRODUODENOSCOPY (EGD);  Surgeon: Rogene Houston, MD;  [facility];  Service: Endoscopy;  Laterality: N/A;  125 - moved to 1:00, Ann to notify pt   Family History  Problem Relation Age of Onset  . Hypertension Mother   . Prostate cancer Father    Social History  Substance Use Topics  . Smoking status: Never Smoker   . Smokeless tobacco: Never Used  . Alcohol  Use: No   OB History    No data available     Review of Systems  Constitutional: Negative for fever, chills, diaphoresis, appetite change and fatigue.  HENT: Negative for mouth sores, sore throat and trouble swallowing.   Eyes: Negative for visual disturbance.  Respiratory: Negative for cough, chest tightness, shortness of breath and wheezing.   Cardiovascular: Negative for chest pain.  Gastrointestinal: Positive for nausea. Negative for vomiting, abdominal pain, diarrhea and abdominal distention.  Endocrine: Negative for polydipsia, polyphagia and polyuria.  Genitourinary: Negative for dysuria, frequency and hematuria.  Musculoskeletal: Negative for gait problem.  Skin: Negative for color change, pallor and rash.  Neurological: Negative for dizziness, syncope, light-headedness and headaches.  Hematological: Does not bruise/bleed easily.  Psychiatric/Behavioral: Negative for behavioral problems and confusion.      Allergies  Apple; Aspirin; Avelox; Bee venom; Benadryl; Chicken allergy; Codeine; Factive; Factive; Levaquin; Levofloxacin; Penicillins; Sulfa antibiotics; and Tequin  Home Medications   Prior to Admission medications   Medication Sig Start Date End Date Taking? Authorizing Provider  acetaminophen (TYLENOL) 500 MG tablet Take 1,000 mg by mouth every 6 (six) hours as needed for mild pain.   Yes Historical Provider, MD  albuterol (PROVENTIL HFA;VENTOLIN HFA) 108 (90 BASE) MCG/ACT inhaler Inhale 2 puffs into the lungs every 6 (six) hours as needed for wheezing or shortness of breath.   Yes Historical Provider, MD  albuterol (PROVENTIL) (2.5 MG/3ML) 0.083% nebulizer solution Take  2.5 mg by nebulization daily as needed for wheezing or shortness of breath.    Yes Historical Provider, MD  aliskiren (TEKTURNA) 150 MG tablet Take 150 mg by mouth at bedtime.    Yes Historical Provider, MD  Calcium-Vitamin D-Vitamin K (CALCIUM SOFT CHEWS PO) Take 1,000 mg by mouth.   Yes Historical  Provider, MD  diltiazem (CARDIZEM CD) 180 MG 24 hr capsule Take 180 mg by mouth at bedtime.    Yes Historical Provider, MD  ezetimibe (ZETIA) 10 MG tablet Take 10 mg by mouth daily.   Yes Historical Provider, MD  Fluticasone-Salmeterol (ADVAIR) 250-50 MCG/DOSE AEPB Inhale 1 puff into the lungs every 12 (twelve) hours.    Yes Historical Provider, MD  folic acid (FOLVITE) 875 MCG tablet Take 400 mcg by mouth daily.   Yes Historical Provider, MD  guanFACINE (TENEX) 1 MG tablet Take 1 mg by mouth at bedtime.     Yes Historical Provider, MD  omeprazole (PRILOSEC) 40 MG capsule Take 40 mg by mouth daily.   Yes Historical Provider, MD  potassium chloride (KLOR-CON) 10 MEQ CR tablet Take 10 mEq by mouth daily.     Yes Historical Provider, MD  prednisoLONE acetate (PRED FORTE) 1 % ophthalmic suspension Place 1 drop into both eyes every hour while awake.   Yes Historical Provider, MD  sodium chloride (MURO 128) 2 % ophthalmic solution Place 2 drops into both eyes 4 (four) times daily.    Yes Historical Provider, MD  cholecalciferol (VITAMIN D) 1000 UNITS tablet Take 1,000 Units by mouth daily.     Historical Provider, MD  colesevelam (WELCHOL) 625 MG tablet Take 1,250 mg by mouth 3 (three) times daily as needed. Patient takes 2 with each meal - equals 6 per day.    Historical Provider, MD  Cyanocobalamin (VITAMIN B 12 PO) Take 2,000 mcg by mouth daily.    Historical Provider, MD  fish oil-omega-3 fatty acids 1000 MG capsule Take 1 g by mouth 2 (two) times daily.     Historical Provider, MD  multivitamin-iron-minerals-folic acid (CENTRUM) chewable tablet Chew 1 tablet by mouth daily.    Historical Provider, MD  ondansetron (ZOFRAN ODT) 4 MG disintegrating tablet Take 1 tablet (4 mg total) by mouth every 8 (eight) hours as needed for nausea. 07/03/15   Tanna Furry, MD  ondansetron [facility]) 4 MG tablet Take home  pack 07/03/15   Tanna Furry, MD   BP 141/80 mmHg  Pulse 80  Temp(Src) 98.2 F (36.8 C) (Oral)  Resp  26  Ht 5\' 3"  (1.6 m)  Wt 142 lb (64.411 kg)  BMI 25.16 kg/m2  SpO2 97% Physical Exam  Constitutional: She is oriented to person, place, and time. She appears well-developed and well-nourished. No distress.  HENT:  Head: Normocephalic.  Eyes: Conjunctivae are normal. Pupils are equal, round, and reactive to light. No scleral icterus.  Neck: Normal range of motion. Neck supple. No thyromegaly present.  Cardiovascular: Normal rate and regular rhythm.  Exam reveals no gallop and no friction rub.   No murmur heard. Pulmonary/Chest: Effort normal and breath sounds normal. No respiratory distress. She has no wheezes. She has no rales.  Abdominal: Soft. Bowel sounds are normal. She exhibits no distension. There is no tenderness. There is no rebound.    Musculoskeletal: Normal range of motion.  Neurological: She is alert and oriented to person, place, and time.  Skin: Skin is warm and dry. No rash noted.  Psychiatric: She has a normal mood and  affect. Her behavior is normal.    ED Course  Procedures (including critical care time) Labs Review Labs Reviewed  COMPREHENSIVE METABOLIC PANEL - Abnormal; Notable for the following:    Chloride 100 (*)    Glucose, Bld 157 (*)    BUN 21 (*)    Creatinine, Ser 1.17 (*)    GFR calc non Af Amer 44 (*)    GFR calc Af Amer 51 (*)    All other components within normal limits  CBC - Abnormal; Notable for the following:    WBC 15.0 (*)    All other components within normal limits  URINALYSIS, ROUTINE W REFLEX MICROSCOPIC (NOT AT [facility]) - Abnormal; Notable for the following:    Specific Gravity, Urine >1.030 (*)    All other components within normal limits  LIPASE, BLOOD  TROPONIN I    Imaging Review No results found. I have personally reviewed and evaluated these images and lab results as part of my medical decision-making.   EKG Interpretation   Date/Time:  Saturday July 03 2015 19:40:32 EDT Ventricular Rate:  96 PR Interval:   138 QRS Duration: 81 QT Interval:  346 QTC Calculation: 437 R Axis:   50 Text Interpretation:  Sinus rhythm Abnormal R-wave progression, early  transition Baseline wander in lead(s) V1 V2 ED PHYSICIAN INTERPRETATION  AVAILABLE IN [facility] Confirmed by TEST, Record ([id number]) on  07/04/2015 7:46:13 AM      MDM   Final diagnoses:  Nausea    48+ hours of nausea without any additional symptoms or findings. Plan is symptomatic treatment, xray and lab evaluation. Acute abdominal series x-rays. EKG shows no acute findings. Troponin pending. We'll reevaluate after symptomatic treatment.    Tanna Furry, MD 07/06/15 [phone]

## 2015-07-03 NOTE — ED Notes (Addendum)
Nausea since Thursday without vomiting. States the feeling is "there but I cant throw up". Pain or "discomfort to upper abdomen and epigastric area. Has taken tums and mylanta with no relief.

## 2015-07-03 NOTE — ED Notes (Signed)
EDP aware that pt remains nauseated

## 2015-07-05 ENCOUNTER — Other Ambulatory Visit (HOSPITAL_COMMUNITY): Payer: Self-pay | Admitting: Pulmonary Disease

## 2015-07-05 DIAGNOSIS — J449 Chronic obstructive pulmonary disease, unspecified: Secondary | ICD-10-CM | POA: Diagnosis not present

## 2015-07-05 DIAGNOSIS — R1084 Generalized abdominal pain: Secondary | ICD-10-CM

## 2015-07-05 DIAGNOSIS — K219 Gastro-esophageal reflux disease without esophagitis: Secondary | ICD-10-CM | POA: Diagnosis not present

## 2015-07-05 DIAGNOSIS — I10 Essential (primary) hypertension: Secondary | ICD-10-CM | POA: Diagnosis not present

## 2015-07-05 DIAGNOSIS — R110 Nausea: Secondary | ICD-10-CM | POA: Diagnosis not present

## 2015-07-06 MED FILL — Ondansetron HCl Tab 4 MG: ORAL | Qty: 4 | Status: AC

## 2015-07-08 ENCOUNTER — Ambulatory Visit (HOSPITAL_COMMUNITY)
Admission: RE | Admit: 2015-07-08 | Discharge: 2015-07-08 | Disposition: A | Discharge status: Home / Self Care | Payer: Medicare Other | Source: Ambulatory Visit | Attending: Pulmonary Disease | Admitting: Pulmonary Disease

## 2015-07-08 DIAGNOSIS — R1084 Generalized abdominal pain: Secondary | ICD-10-CM | POA: Diagnosis not present

## 2015-07-08 DIAGNOSIS — R109 Unspecified abdominal pain: Secondary | ICD-10-CM | POA: Diagnosis not present

## 2015-07-12 ENCOUNTER — Other Ambulatory Visit (HOSPITAL_COMMUNITY): Payer: Self-pay | Admitting: Pulmonary Disease

## 2015-07-12 DIAGNOSIS — R110 Nausea: Secondary | ICD-10-CM

## 2015-07-14 ENCOUNTER — Encounter (HOSPITAL_COMMUNITY): Payer: Self-pay

## 2015-07-14 ENCOUNTER — Ambulatory Visit (HOSPITAL_COMMUNITY)
Admission: RE | Admit: 2015-07-14 | Discharge: 2015-07-14 | Disposition: A | Discharge status: Home / Self Care | Payer: Medicare Other | Source: Ambulatory Visit | Attending: Pulmonary Disease | Admitting: Pulmonary Disease

## 2015-07-14 DIAGNOSIS — K449 Diaphragmatic hernia without obstruction or gangrene: Secondary | ICD-10-CM | POA: Diagnosis not present

## 2015-07-14 DIAGNOSIS — R110 Nausea: Secondary | ICD-10-CM | POA: Insufficient documentation

## 2015-07-14 DIAGNOSIS — K5730 Diverticulosis of large intestine without perforation or abscess without bleeding: Secondary | ICD-10-CM | POA: Insufficient documentation

## 2015-07-14 DIAGNOSIS — R109 Unspecified abdominal pain: Secondary | ICD-10-CM | POA: Diagnosis not present

## 2015-07-14 MED ORDER — STERILE WATER FOR INJECTION IJ SOLN
INTRAMUSCULAR | Status: AC
  Administered 2015-07-14: 5 mL via INTRAVENOUS
  Filled 2015-07-14: qty 10

## 2015-07-14 MED ORDER — SINCALIDE 5 MCG IJ SOLR
INTRAMUSCULAR | Status: AC
  Administered 2015-07-14: 1.32 ug via INTRAVENOUS
  Filled 2015-07-14: qty 5

## 2015-07-14 MED ORDER — TECHNETIUM TC 99M MEBROFENIN IV KIT
5.0000 | PACK | Freq: Once | INTRAVENOUS | Status: DC | PRN
  Administered 2015-07-14: 5 via INTRAVENOUS
  Filled 2015-07-14: qty 6

## 2015-07-14 MED ORDER — SODIUM CHLORIDE 0.9 % IJ SOLN
INTRAMUSCULAR | Status: AC
  Filled 2015-07-14: qty 42

## 2015-07-15 ENCOUNTER — Other Ambulatory Visit (HOSPITAL_COMMUNITY): Payer: Self-pay | Admitting: Pulmonary Disease

## 2015-07-15 DIAGNOSIS — F419 Anxiety disorder, unspecified: Secondary | ICD-10-CM | POA: Diagnosis not present

## 2015-07-15 DIAGNOSIS — J449 Chronic obstructive pulmonary disease, unspecified: Secondary | ICD-10-CM | POA: Diagnosis not present

## 2015-07-15 DIAGNOSIS — R109 Unspecified abdominal pain: Secondary | ICD-10-CM | POA: Diagnosis not present

## 2015-07-15 DIAGNOSIS — I10 Essential (primary) hypertension: Secondary | ICD-10-CM | POA: Diagnosis not present

## 2015-07-15 DIAGNOSIS — R1084 Generalized abdominal pain: Secondary | ICD-10-CM

## 2015-07-16 ENCOUNTER — Ambulatory Visit (HOSPITAL_COMMUNITY)
Admission: RE | Admit: 2015-07-16 | Discharge: 2015-07-16 | Disposition: A | Discharge status: Home / Self Care | Payer: Medicare Other | Source: Ambulatory Visit | Attending: Pulmonary Disease | Admitting: Pulmonary Disease

## 2015-07-16 DIAGNOSIS — R1084 Generalized abdominal pain: Secondary | ICD-10-CM

## 2015-07-16 DIAGNOSIS — K449 Diaphragmatic hernia without obstruction or gangrene: Secondary | ICD-10-CM | POA: Diagnosis not present

## 2015-07-16 MED ORDER — IOHEXOL 300 MG/ML  SOLN
80.0000 mL | Freq: Once | INTRAMUSCULAR | Status: AC | PRN
  Administered 2015-07-16: 80 mL via INTRAVENOUS

## 2015-07-16 MED ORDER — IOHEXOL 300 MG/ML  SOLN: 100.0000 mL | Freq: Once | INTRAMUSCULAR | Status: DC | PRN

## 2015-07-19 DIAGNOSIS — F329 Major depressive disorder, single episode, unspecified: Secondary | ICD-10-CM | POA: Diagnosis not present

## 2015-07-19 DIAGNOSIS — I10 Essential (primary) hypertension: Secondary | ICD-10-CM | POA: Diagnosis not present

## 2015-07-19 DIAGNOSIS — J449 Chronic obstructive pulmonary disease, unspecified: Secondary | ICD-10-CM | POA: Diagnosis not present

## 2015-07-19 DIAGNOSIS — R110 Nausea: Secondary | ICD-10-CM | POA: Diagnosis not present

## 2015-07-23 ENCOUNTER — Telehealth (INDEPENDENT_AMBULATORY_CARE_PROVIDER_SITE_OTHER): Payer: Self-pay | Admitting: *Deleted

## 2015-07-23 ENCOUNTER — Other Ambulatory Visit (INDEPENDENT_AMBULATORY_CARE_PROVIDER_SITE_OTHER): Payer: Self-pay | Admitting: *Deleted

## 2015-07-23 DIAGNOSIS — R109 Unspecified abdominal pain: Secondary | ICD-10-CM

## 2015-07-23 DIAGNOSIS — R110 Nausea: Secondary | ICD-10-CM

## 2015-07-23 NOTE — Telephone Encounter (Signed)
EGD sch'd 07/30/15,patient aware

## 2015-07-23 NOTE — Telephone Encounter (Signed)
Per Dr. Laural Golden, schedule Stephanie West for an Endoscopy next week.

## 2015-07-30 ENCOUNTER — Encounter (HOSPITAL_COMMUNITY): Payer: Self-pay | Admitting: *Deleted

## 2015-07-30 ENCOUNTER — Encounter (HOSPITAL_COMMUNITY)
Admission: RE | Disposition: A | Discharge status: Home / Self Care | Payer: Self-pay | Source: Ambulatory Visit | Attending: Internal Medicine

## 2015-07-30 ENCOUNTER — Ambulatory Visit (HOSPITAL_COMMUNITY)
Admission: RE | Admit: 2015-07-30 | Discharge: 2015-07-30 | Disposition: A | Discharge status: Home / Self Care | Payer: Medicare Other | Source: Ambulatory Visit | Attending: Internal Medicine | Admitting: Internal Medicine

## 2015-07-30 DIAGNOSIS — K449 Diaphragmatic hernia without obstruction or gangrene: Secondary | ICD-10-CM | POA: Insufficient documentation

## 2015-07-30 DIAGNOSIS — R1013 Epigastric pain: Secondary | ICD-10-CM | POA: Diagnosis not present

## 2015-07-30 DIAGNOSIS — K222 Esophageal obstruction: Secondary | ICD-10-CM | POA: Insufficient documentation

## 2015-07-30 DIAGNOSIS — Z79899 Other long term (current) drug therapy: Secondary | ICD-10-CM | POA: Insufficient documentation

## 2015-07-30 DIAGNOSIS — R1111 Vomiting without nausea: Secondary | ICD-10-CM | POA: Diagnosis not present

## 2015-07-30 DIAGNOSIS — R109 Unspecified abdominal pain: Secondary | ICD-10-CM

## 2015-07-30 DIAGNOSIS — K2950 Unspecified chronic gastritis without bleeding: Secondary | ICD-10-CM | POA: Insufficient documentation

## 2015-07-30 DIAGNOSIS — Z8042 Family history of malignant neoplasm of prostate: Secondary | ICD-10-CM | POA: Diagnosis not present

## 2015-07-30 DIAGNOSIS — R110 Nausea: Secondary | ICD-10-CM

## 2015-07-30 DIAGNOSIS — R634 Abnormal weight loss: Secondary | ICD-10-CM | POA: Insufficient documentation

## 2015-07-30 DIAGNOSIS — K2970 Gastritis, unspecified, without bleeding: Secondary | ICD-10-CM | POA: Diagnosis not present

## 2015-07-30 DIAGNOSIS — I10 Essential (primary) hypertension: Secondary | ICD-10-CM | POA: Diagnosis not present

## 2015-07-30 DIAGNOSIS — K219 Gastro-esophageal reflux disease without esophagitis: Secondary | ICD-10-CM | POA: Insufficient documentation

## 2015-07-30 HISTORY — PX: ESOPHAGEAL DILATION: SHX303

## 2015-07-30 HISTORY — PX: ESOPHAGOGASTRODUODENOSCOPY: SHX5428

## 2015-07-30 SURGERY — EGD (ESOPHAGOGASTRODUODENOSCOPY)
Anesthesia: Moderate Sedation

## 2015-07-30 MED ORDER — MIDAZOLAM HCL 5 MG/5ML IJ SOLN
INTRAMUSCULAR | Status: DC | PRN
  Administered 2015-07-30 (×3): 2 mg via INTRAVENOUS

## 2015-07-30 MED ORDER — SODIUM CHLORIDE 0.9 % IV SOLN
INTRAVENOUS | Status: DC
  Administered 2015-07-30: 1000 mL via INTRAVENOUS

## 2015-07-30 MED ORDER — MEPERIDINE HCL 50 MG/ML IJ SOLN
INTRAMUSCULAR | Status: DC | PRN
  Administered 2015-07-30 (×2): 25 mg via INTRAVENOUS

## 2015-07-30 MED ORDER — BUTAMBEN-TETRACAINE-BENZOCAINE 2-2-14 % EX AERO
INHALATION_SPRAY | CUTANEOUS | Status: DC | PRN
  Administered 2015-07-30: 2 via TOPICAL

## 2015-07-30 MED ORDER — MEPERIDINE HCL 50 MG/ML IJ SOLN
INTRAMUSCULAR | Status: AC
  Filled 2015-07-30: qty 1

## 2015-07-30 MED ORDER — ISOSORBIDE MONONITRATE 10 MG PO TABS: 5.0000 mg | ORAL_TABLET | Freq: Three times a day (TID) | ORAL | Status: DC

## 2015-07-30 MED ORDER — MIDAZOLAM HCL 5 MG/5ML IJ SOLN
INTRAMUSCULAR | Status: AC
  Filled 2015-07-30: qty 10

## 2015-07-30 MED ORDER — STERILE WATER FOR IRRIGATION IR SOLN
Status: DC | PRN
  Administered 2015-07-30: 12:00:00

## 2015-07-30 NOTE — Progress Notes (Signed)
Left hand with approximately 3cm echymosis under skin at site of removal. Patient denies pain.  Cool compress applied. Patient and family will call if significant pain or heat to touch with bruising.

## 2015-07-30 NOTE — H&P (Signed)
Stephanie West is an 74 y.o. female.   Chief Complaint: Patient is here for EGD. HPI: Patient is 74 year old Caucasian female was chronic GERD who underwent EGD on 04/15/2015 she presented with solid food dysphagia. Prior barium study revealed distal esophageal stricture preventing passage of barium pill distally. On EGD she had Schatzki's ring which was disrupted with focal biopsy should moderate-sized sliding hiatal hernia. She was doing fine until 5 weeks ago and ever since she has epigastric painafter she finishes eating. She is not able to eat full meals. She has had nausea with evening but no vomiting. She has lost 6 pounds. She was evaluated by Dr. Luan Pulling. She had upper abdominal ultrasound. No gallstones or dilated duct identified. HIDA scan revealed EF of 94% and she finally had abdominopelvic CT which revealed diverticulosis and moderate-sized sliding hiatal hernia but no other abnormalities account for her symptoms. He was seen in emergency room about 4 weeks ago and LFTs and serum lipase were normal. She denies dysphagia and she says heartburns well controlled with therapy. He does not take OTC NSAIDs. She states she has lost 6 pounds in the last 5 weeks.  Past Medical History  Diagnosis Date  . Hypertension   . Asthma   . Elevated transaminase level   . GERD (gastroesophageal reflux disease)   . MVP (mitral valve prolapse)     Past Surgical History  Procedure Laterality Date  . Laparoscopic tubal ligation  1984  . Colonoscopy    . Upper gastrointestinal endoscopy    . Esophagogastroduodenoscopy (egd) with esophageal dilation N/A 03/07/2013    Procedure: ESOPHAGOGASTRODUODENOSCOPY (EGD) WITH ESOPHAGEAL DILATION;  Surgeon: Rogene Houston, MD;  [facility];  Service: Endoscopy;  Laterality: N/A;  830  . Esophagogastroduodenoscopy N/A 04/15/2015    Procedure: ESOPHAGOGASTRODUODENOSCOPY (EGD);  Surgeon: Rogene Houston, MD;  [facility];  Service: Endoscopy;   Laterality: N/A;  125 - moved to 1:00, Ann to notify pt    Family History  Problem Relation Age of Onset  . Hypertension Mother   . Prostate cancer Father    Social History:  reports that she has never smoked. She has never used smokeless tobacco. She reports that she does not drink alcohol or use illicit drugs.  Allergies:  Allergies  Allergen Reactions  . Apple Other (See Comments)    Wheezing and Asthma Symptoms   . Aspirin Other (See Comments)    Wheezing, Asthma Symptoms   . Avelox [Moxifloxacin Hcl In Nacl] Hives  . Bee Venom Other (See Comments)    Wheezing, Asthma Symptoms  . Benadryl [Diphenhydramine Hcl] Other (See Comments)    Wheezing, Asthma Symptoms   . Chicken Allergy Other (See Comments)    Wheezing and Asthma Symptoms   . Codeine Other (See Comments)    Makes patient feel like she's going to pass out.   Haze Boyden [Gemifloxacin Mesylate] Hives  . Factive [Gemifloxacin] Hives  . Levaquin [Levofloxacin In D5w] Hives  . Levofloxacin Hives  . Penicillins Other (See Comments)    Asthma Symptoms  . Sulfa Antibiotics Nausea Only  . Tequin [Gatifloxacin] Hives    Medications Prior to Admission  Medication Sig Dispense Refill  . aliskiren (TEKTURNA) 150 MG tablet Take 150 mg by mouth at bedtime.     Marland Kitchen diltiazem (CARDIZEM CD) 180 MG 24 hr capsule Take 180 mg by mouth at bedtime.     . Fluticasone-Salmeterol (ADVAIR) 250-50 MCG/DOSE AEPB Inhale 1 puff into the lungs every  12 (twelve) hours.     Marland Kitchen guanFACINE (TENEX) 1 MG tablet Take 1 mg by mouth at bedtime.      Marland Kitchen omeprazole (PRILOSEC) 40 MG capsule Take 40 mg by mouth daily.    . ondansetron (ZOFRAN ODT) 4 MG disintegrating tablet Take 1 tablet (4 mg total) by mouth every 8 (eight) hours as needed for nausea. 20 tablet 0  . potassium chloride (KLOR-CON) 10 MEQ CR tablet Take 10 mEq by mouth daily.      . prednisoLONE acetate (PRED FORTE) 1 % ophthalmic suspension Place 1 drop into both eyes every hour while awake.     . sodium chloride (MURO 128) 2 % ophthalmic solution Place 2 drops into both eyes 4 (four) times daily.     Marland Kitchen acetaminophen (TYLENOL) 500 MG tablet Take 1,000 mg by mouth every 6 (six) hours as needed for mild pain.    Marland Kitchen albuterol (PROVENTIL HFA;VENTOLIN HFA) 108 (90 BASE) MCG/ACT inhaler Inhale 2 puffs into the lungs every 6 (six) hours as needed for wheezing or shortness of breath.    Marland Kitchen albuterol (PROVENTIL) (2.5 MG/3ML) 0.083% nebulizer solution Take 2.5 mg by nebulization daily as needed for wheezing or shortness of breath.     . Calcium-Vitamin D-Vitamin K (CALCIUM SOFT CHEWS PO) Take 1,000 mg by mouth.    . cholecalciferol (VITAMIN D) 1000 UNITS tablet Take 1,000 Units by mouth daily.     . colesevelam (WELCHOL) 625 MG tablet Take 1,250 mg by mouth 3 (three) times daily as needed. Patient takes 2 with each meal - equals 6 per day.    . Cyanocobalamin (VITAMIN B 12 PO) Take 2,000 mcg by mouth daily.    Marland Kitchen ezetimibe (ZETIA) 10 MG tablet Take 10 mg by mouth daily.    . fish oil-omega-3 fatty acids 1000 MG capsule Take 1 g by mouth 2 (two) times daily.     . folic acid (FOLVITE) 235 MCG tablet Take 400 mcg by mouth daily.    . multivitamin-iron-minerals-folic acid (CENTRUM) chewable tablet Chew 1 tablet by mouth daily.    . ondansetron (ZOFRAN) 4 MG tablet Take home  pack 4 tablet 0    No results found for this or any previous visit (from the past 48 hour(s)). No results found.  ROS  Blood pressure 186/75, pulse 90, temperature 98.6 F (37 C), temperature source Oral, resp. rate 14, height 5\' 3"  (1.6 m), weight 137 lb (62.143 kg), SpO2 98 %. Physical Exam  Constitutional: She appears well-developed and well-nourished.  HENT:  Mouth/Throat: Oropharynx is clear and moist.  Eyes: Conjunctivae are normal. No scleral icterus.  Neck: No thyromegaly present.  Cardiovascular: Normal rate, regular rhythm and normal heart sounds.   No murmur heard. Respiratory: Effort normal and breath sounds  normal.  GI:  Abdomen is symmetrical soft with mild midepigastric tenderness. No organomegaly or masses.  Musculoskeletal: She exhibits no edema.  Lymphadenopathy:    She has no cervical adenopathy.  Neurological: She is alert.  Skin: Skin is warm and dry.     Assessment/Plan Five week history of postprandial epigastric pain with nausea and 5 pound weight loss. Workup negative including normal LFTs lipase ultrasound HIDA with CCK and abdominopelvic CT other than moderate-sized sliding hiatal hernia. Diagnostic EGD.  Josilynn Losh U 07/30/2015, 11:38 AM

## 2015-07-30 NOTE — Op Note (Signed)
EGD PROCEDURE REPORT  PATIENT:  Stephanie West  MR#:  [id number] Birthdate:  1941/07/07, 74 y.o., female Endoscopist:  Dr. Rogene Houston, MD Referred By:  Dr. Rayne Du ref. provider found Procedure Date: 07/30/2015  Procedure:   EGD with ED(unplanned).  Indications:  Patient is 25 old Caucasian female was chronic GERD and heartburns well controlled with therapy. She presented with dysphagia in June this ear. Barium study revealed distal esophageal stricture obstructing passage of barium pill. She underwent EGD on 04/15/2015 reeling Schatzki's ring which is distributed focal biopsy with resolution of dysphagia. Now she presents with 5 week history of daily postprandial pain and intractable nausea and she is not able to eat and she has lost 6 pounds. She denies dysphagia to solids but still has difficulty with pills. Workup includes normal LFTs and serum lipase. She had ultrasound negative for cholelithiasis and normal HIDA scan with CCK. Abdominopelvic CT reveals atherosclerotic changes to go time calcification to the takeoff of celiac trunk and SMA as well as moderate-sized sliding hiatal hernia. She is undergoing diagnostic EGD.           Informed Consent:  The risks, benefits, alternatives & imponderables which include, but are not limited to, bleeding, infection, perforation, drug reaction and potential missed lesion have been reviewed.  The potential for biopsy, lesion removal, esophageal dilation, etc. have also been discussed.  Questions have been answered.  All parties agreeable.  Please see history & physical in medical record for more information.  Medications:  Demerol 50 mg IV Versed 6 mg IV Cetacaine spray topically for oropharyngeal anesthesia  Description of procedure:  The endoscope was introduced through the mouth and advanced to the second portion of the duodenum without difficulty or limitations. The mucosal surfaces were surveyed very carefully during advancement of the scope and  upon withdrawal.  Findings:  Esophagus:  Mucosa of the esophagus was normal. Esophageal body was tortuous. Prominent ring noted at GE junction. This ring was initially disrupted with scope and subsequently with a balloon and biopsy as below. GEJ:  28 cm Hiatus:  35 cm Stomach:  Stomach was empty and distended very well with insufflation. Folds in the proximal stomach were normal. Examination of mucosa at gastric body was normal. Patchy linear erythema noted to antral mucosa without erosions or ulcers. Pyloric channel was patent. Angularis fundus and cardia but examined by retroflex the scope and were unremarkable. Hernia was easily seen on this.. Duodenum:  Normal bulbar and post bulbar mucosa.  Therapeutic/Diagnostic Maneuvers Performed:   Antral biopsy taken for routine histology. Schatzki's ring was further disrupted with balloon dilation to 18 and 19 mm. Ring was further disrupted with focal biopsy at 3 sites.  Complications:  None  EBL: Minimal  Impression: High-grade Schatzki's ring which was disrupted with combination of scope balloon dilation and focal biopsy. Moderate-sized sliding hiatal hernia. Nonerosive antral gastritis. Biopsy taken.  Recommendations:  Standard instructions given. Ismo 5 mg by mouth before meals. I would be contacting patient with biopsy results and further recommendations.  [name] U  07/30/2015  12:22 PM  CC: Dr. Alonza Bogus, MD & Dr. Rayne Du ref. provider found

## 2015-07-30 NOTE — Discharge Instructions (Signed)
Resume usual medications and diet. Ismo 5 mg by mouth 30 minutes before each meal. No driving for 24 hours. Physician will call with biopsy results and further recommendations.       Esophagogastroduodenoscopy, Care After Refer to this sheet in the next few weeks. These instructions provide you with information about caring for yourself after your procedure. Your health care provider may also give you more specific instructions. Your treatment has been planned according to current medical practices, but problems sometimes occur. Call your health care provider if you have any problems or questions after your procedure. WHAT TO EXPECT AFTER THE PROCEDURE After your procedure, it is typical to feel:  Soreness in your throat.  Pain with swallowing.  Sick to your stomach (nauseous).  Bloated.  Dizzy.  Fatigued. HOME CARE INSTRUCTIONS  Do not eat or drink anything until the numbing medicine (local anesthetic) has worn off and your gag reflex has returned. You will know that the local anesthetic has worn off when you can swallow comfortably.  Do not drive or operate machinery until directed by your health care provider.  Take medicines only as directed by your health care provider. SEEK MEDICAL CARE IF:   You cannot stop coughing.  You are not urinating at all or less than usual. SEEK IMMEDIATE MEDICAL CARE IF:  You have difficulty swallowing.  You cannot eat or drink.  You have worsening throat or chest pain.  You have dizziness or lightheadedness or you faint.  You have nausea or vomiting.  You have chills.  You have a fever.  You have severe abdominal pain.  You have black, tarry, or bloody stools.   This information is not intended to replace advice given to you by your health care provider. Make sure you discuss any questions you have with your health care provider.   Document Released: 09/25/2012 Document Revised: 10/30/2014 Document Reviewed:  09/25/2012 Elsevier Interactive Patient Education [employer].

## 2015-08-02 ENCOUNTER — Other Ambulatory Visit (HOSPITAL_COMMUNITY): Payer: Self-pay | Admitting: Pulmonary Disease

## 2015-08-02 DIAGNOSIS — F419 Anxiety disorder, unspecified: Secondary | ICD-10-CM | POA: Diagnosis not present

## 2015-08-02 DIAGNOSIS — R109 Unspecified abdominal pain: Secondary | ICD-10-CM | POA: Diagnosis not present

## 2015-08-02 DIAGNOSIS — I998 Other disorder of circulatory system: Secondary | ICD-10-CM

## 2015-08-02 DIAGNOSIS — J449 Chronic obstructive pulmonary disease, unspecified: Secondary | ICD-10-CM | POA: Diagnosis not present

## 2015-08-02 DIAGNOSIS — R110 Nausea: Secondary | ICD-10-CM | POA: Diagnosis not present

## 2015-08-03 ENCOUNTER — Encounter (HOSPITAL_COMMUNITY): Payer: Self-pay | Admitting: Internal Medicine

## 2015-08-04 ENCOUNTER — Ambulatory Visit (HOSPITAL_COMMUNITY)
Admission: RE | Admit: 2015-08-04 | Discharge: 2015-08-04 | Disposition: A | Discharge status: Home / Self Care | Payer: Medicare Other | Source: Ambulatory Visit | Attending: Pulmonary Disease | Admitting: Pulmonary Disease

## 2015-08-04 DIAGNOSIS — R110 Nausea: Secondary | ICD-10-CM | POA: Diagnosis not present

## 2015-08-04 DIAGNOSIS — R1013 Epigastric pain: Secondary | ICD-10-CM | POA: Diagnosis not present

## 2015-08-04 DIAGNOSIS — I998 Other disorder of circulatory system: Secondary | ICD-10-CM

## 2015-08-04 DIAGNOSIS — K5730 Diverticulosis of large intestine without perforation or abscess without bleeding: Secondary | ICD-10-CM | POA: Diagnosis not present

## 2015-08-04 DIAGNOSIS — K449 Diaphragmatic hernia without obstruction or gangrene: Secondary | ICD-10-CM | POA: Diagnosis not present

## 2015-08-04 DIAGNOSIS — K55059 Acute (reversible) ischemia of intestine, part and extent unspecified: Secondary | ICD-10-CM | POA: Insufficient documentation

## 2015-08-04 LAB — POCT I-STAT CREATININE: CREATININE: 1.2 mg/dL — AB (ref 0.44–1.00)

## 2015-08-04 MED ORDER — IOHEXOL 300 MG/ML  SOLN: 80.0000 mL | Freq: Once | INTRAMUSCULAR | Status: DC | PRN

## 2015-08-04 MED ORDER — IOHEXOL 350 MG/ML SOLN
80.0000 mL | Freq: Once | INTRAVENOUS | Status: AC | PRN
  Administered 2015-08-04: 80 mL via INTRAVENOUS

## 2015-08-06 DIAGNOSIS — R1030 Lower abdominal pain, unspecified: Secondary | ICD-10-CM | POA: Diagnosis not present

## 2015-08-06 DIAGNOSIS — F419 Anxiety disorder, unspecified: Secondary | ICD-10-CM | POA: Diagnosis not present

## 2015-08-06 DIAGNOSIS — R110 Nausea: Secondary | ICD-10-CM | POA: Diagnosis not present

## 2015-08-06 DIAGNOSIS — J449 Chronic obstructive pulmonary disease, unspecified: Secondary | ICD-10-CM | POA: Diagnosis not present

## 2015-08-23 DIAGNOSIS — R109 Unspecified abdominal pain: Secondary | ICD-10-CM | POA: Diagnosis not present

## 2015-08-23 DIAGNOSIS — F329 Major depressive disorder, single episode, unspecified: Secondary | ICD-10-CM | POA: Diagnosis not present

## 2015-08-23 DIAGNOSIS — J329 Chronic sinusitis, unspecified: Secondary | ICD-10-CM | POA: Diagnosis not present

## 2015-08-23 DIAGNOSIS — Z23 Encounter for immunization: Secondary | ICD-10-CM | POA: Diagnosis not present

## 2015-08-23 DIAGNOSIS — I10 Essential (primary) hypertension: Secondary | ICD-10-CM | POA: Diagnosis not present

## 2015-10-26 DIAGNOSIS — F419 Anxiety disorder, unspecified: Secondary | ICD-10-CM | POA: Diagnosis not present

## 2015-10-26 DIAGNOSIS — I10 Essential (primary) hypertension: Secondary | ICD-10-CM | POA: Diagnosis not present

## 2015-10-26 DIAGNOSIS — J441 Chronic obstructive pulmonary disease with (acute) exacerbation: Secondary | ICD-10-CM | POA: Diagnosis not present

## 2015-10-26 DIAGNOSIS — H4423 Degenerative myopia, bilateral: Secondary | ICD-10-CM | POA: Diagnosis not present

## 2015-11-09 ENCOUNTER — Encounter (INDEPENDENT_AMBULATORY_CARE_PROVIDER_SITE_OTHER): Payer: Self-pay | Admitting: Internal Medicine

## 2015-11-19 DIAGNOSIS — Z947 Corneal transplant status: Secondary | ICD-10-CM | POA: Diagnosis not present

## 2015-11-19 DIAGNOSIS — H1851 Endothelial corneal dystrophy: Secondary | ICD-10-CM | POA: Diagnosis not present

## 2015-11-19 DIAGNOSIS — H25813 Combined forms of age-related cataract, bilateral: Secondary | ICD-10-CM | POA: Diagnosis not present

## 2015-12-24 DIAGNOSIS — R7989 Other specified abnormal findings of blood chemistry: Secondary | ICD-10-CM | POA: Diagnosis not present

## 2015-12-28 DIAGNOSIS — J449 Chronic obstructive pulmonary disease, unspecified: Secondary | ICD-10-CM | POA: Diagnosis not present

## 2015-12-28 DIAGNOSIS — Z780 Asymptomatic menopausal state: Secondary | ICD-10-CM | POA: Diagnosis not present

## 2015-12-28 DIAGNOSIS — H25812 Combined forms of age-related cataract, left eye: Secondary | ICD-10-CM | POA: Diagnosis not present

## 2015-12-28 DIAGNOSIS — J4540 Moderate persistent asthma, uncomplicated: Secondary | ICD-10-CM | POA: Diagnosis not present

## 2015-12-28 DIAGNOSIS — F40240 Claustrophobia: Secondary | ICD-10-CM | POA: Diagnosis not present

## 2015-12-28 DIAGNOSIS — K222 Esophageal obstruction: Secondary | ICD-10-CM | POA: Diagnosis not present

## 2015-12-28 DIAGNOSIS — I129 Hypertensive chronic kidney disease with stage 1 through stage 4 chronic kidney disease, or unspecified chronic kidney disease: Secondary | ICD-10-CM | POA: Diagnosis not present

## 2015-12-28 DIAGNOSIS — H918X1 Other specified hearing loss, right ear: Secondary | ICD-10-CM | POA: Diagnosis not present

## 2015-12-28 DIAGNOSIS — H2512 Age-related nuclear cataract, left eye: Secondary | ICD-10-CM | POA: Diagnosis not present

## 2015-12-28 DIAGNOSIS — H1851 Endothelial corneal dystrophy: Secondary | ICD-10-CM | POA: Diagnosis not present

## 2015-12-28 DIAGNOSIS — N189 Chronic kidney disease, unspecified: Secondary | ICD-10-CM | POA: Diagnosis not present

## 2015-12-28 DIAGNOSIS — K219 Gastro-esophageal reflux disease without esophagitis: Secondary | ICD-10-CM | POA: Diagnosis not present

## 2015-12-28 DIAGNOSIS — Z85828 Personal history of other malignant neoplasm of skin: Secondary | ICD-10-CM | POA: Diagnosis not present

## 2016-01-04 ENCOUNTER — Encounter: Payer: Self-pay | Admitting: Family Medicine

## 2016-01-04 DIAGNOSIS — R110 Nausea: Secondary | ICD-10-CM | POA: Diagnosis not present

## 2016-01-04 DIAGNOSIS — J449 Chronic obstructive pulmonary disease, unspecified: Secondary | ICD-10-CM | POA: Diagnosis not present

## 2016-01-04 DIAGNOSIS — R000 Tachycardia, unspecified: Secondary | ICD-10-CM | POA: Diagnosis not present

## 2016-01-04 DIAGNOSIS — I10 Essential (primary) hypertension: Secondary | ICD-10-CM | POA: Diagnosis not present

## 2016-01-04 DIAGNOSIS — F419 Anxiety disorder, unspecified: Secondary | ICD-10-CM | POA: Diagnosis not present

## 2016-01-07 DIAGNOSIS — I10 Essential (primary) hypertension: Secondary | ICD-10-CM | POA: Diagnosis not present

## 2016-01-07 DIAGNOSIS — J449 Chronic obstructive pulmonary disease, unspecified: Secondary | ICD-10-CM | POA: Diagnosis not present

## 2016-01-11 DIAGNOSIS — I10 Essential (primary) hypertension: Secondary | ICD-10-CM | POA: Diagnosis not present

## 2016-01-12 DIAGNOSIS — H1851 Endothelial corneal dystrophy: Secondary | ICD-10-CM | POA: Diagnosis not present

## 2016-01-24 DIAGNOSIS — J329 Chronic sinusitis, unspecified: Secondary | ICD-10-CM | POA: Diagnosis not present

## 2016-01-24 DIAGNOSIS — R110 Nausea: Secondary | ICD-10-CM | POA: Diagnosis not present

## 2016-01-24 DIAGNOSIS — J019 Acute sinusitis, unspecified: Secondary | ICD-10-CM | POA: Diagnosis not present

## 2016-01-24 DIAGNOSIS — I10 Essential (primary) hypertension: Secondary | ICD-10-CM | POA: Diagnosis not present

## 2016-02-02 DIAGNOSIS — R110 Nausea: Secondary | ICD-10-CM | POA: Diagnosis not present

## 2016-02-02 DIAGNOSIS — J329 Chronic sinusitis, unspecified: Secondary | ICD-10-CM | POA: Diagnosis not present

## 2016-02-02 DIAGNOSIS — I10 Essential (primary) hypertension: Secondary | ICD-10-CM | POA: Diagnosis not present

## 2016-02-02 DIAGNOSIS — J45909 Unspecified asthma, uncomplicated: Secondary | ICD-10-CM | POA: Diagnosis not present

## 2016-02-07 ENCOUNTER — Encounter (HOSPITAL_COMMUNITY): Payer: Self-pay | Admitting: Emergency Medicine

## 2016-02-07 ENCOUNTER — Inpatient Hospital Stay (HOSPITAL_COMMUNITY)
Admission: EM | Admit: 2016-02-07 | Discharge: 2016-02-12 | DRG: 683 | Disposition: A | Discharge status: Home / Self Care | Payer: Medicare Other | Attending: Pulmonary Disease | Admitting: Pulmonary Disease

## 2016-02-07 DIAGNOSIS — E876 Hypokalemia: Secondary | ICD-10-CM | POA: Diagnosis present

## 2016-02-07 DIAGNOSIS — Z947 Corneal transplant status: Secondary | ICD-10-CM

## 2016-02-07 DIAGNOSIS — E871 Hypo-osmolality and hyponatremia: Secondary | ICD-10-CM | POA: Diagnosis not present

## 2016-02-07 DIAGNOSIS — N183 Chronic kidney disease, stage 3 unspecified: Secondary | ICD-10-CM | POA: Diagnosis present

## 2016-02-07 DIAGNOSIS — N179 Acute kidney failure, unspecified: Principal | ICD-10-CM | POA: Diagnosis present

## 2016-02-07 DIAGNOSIS — I129 Hypertensive chronic kidney disease with stage 1 through stage 4 chronic kidney disease, or unspecified chronic kidney disease: Secondary | ICD-10-CM | POA: Diagnosis not present

## 2016-02-07 DIAGNOSIS — R110 Nausea: Secondary | ICD-10-CM | POA: Diagnosis not present

## 2016-02-07 DIAGNOSIS — R197 Diarrhea, unspecified: Secondary | ICD-10-CM | POA: Diagnosis not present

## 2016-02-07 DIAGNOSIS — I160 Hypertensive urgency: Secondary | ICD-10-CM | POA: Diagnosis present

## 2016-02-07 DIAGNOSIS — R112 Nausea with vomiting, unspecified: Secondary | ICD-10-CM | POA: Diagnosis present

## 2016-02-07 DIAGNOSIS — K219 Gastro-esophageal reflux disease without esophagitis: Secondary | ICD-10-CM | POA: Diagnosis present

## 2016-02-07 DIAGNOSIS — I10 Essential (primary) hypertension: Secondary | ICD-10-CM | POA: Diagnosis present

## 2016-02-07 DIAGNOSIS — Z809 Family history of malignant neoplasm, unspecified: Secondary | ICD-10-CM

## 2016-02-07 DIAGNOSIS — N289 Disorder of kidney and ureter, unspecified: Secondary | ICD-10-CM | POA: Diagnosis not present

## 2016-02-07 DIAGNOSIS — A020 Salmonella enteritis: Secondary | ICD-10-CM | POA: Diagnosis not present

## 2016-02-07 DIAGNOSIS — F411 Generalized anxiety disorder: Secondary | ICD-10-CM | POA: Diagnosis present

## 2016-02-07 DIAGNOSIS — R1110 Vomiting, unspecified: Secondary | ICD-10-CM

## 2016-02-07 DIAGNOSIS — J45909 Unspecified asthma, uncomplicated: Secondary | ICD-10-CM | POA: Diagnosis present

## 2016-02-07 DIAGNOSIS — J329 Chronic sinusitis, unspecified: Secondary | ICD-10-CM | POA: Diagnosis present

## 2016-02-07 DIAGNOSIS — I169 Hypertensive crisis, unspecified: Secondary | ICD-10-CM | POA: Diagnosis present

## 2016-02-07 DIAGNOSIS — E860 Dehydration: Secondary | ICD-10-CM | POA: Diagnosis not present

## 2016-02-07 DIAGNOSIS — Z8249 Family history of ischemic heart disease and other diseases of the circulatory system: Secondary | ICD-10-CM

## 2016-02-07 LAB — URINALYSIS, ROUTINE W REFLEX MICROSCOPIC
BILIRUBIN URINE: NEGATIVE
Glucose, UA: NEGATIVE mg/dL
Ketones, ur: NEGATIVE mg/dL
NITRITE: NEGATIVE
SPECIFIC GRAVITY, URINE: 1.01 (ref 1.005–1.030)
pH: 5.5 (ref 5.0–8.0)

## 2016-02-07 LAB — COMPREHENSIVE METABOLIC PANEL
ALK PHOS: 61 U/L (ref 38–126)
ALT: 13 U/L — ABNORMAL LOW (ref 14–54)
ANION GAP: 11 (ref 5–15)
AST: 24 U/L (ref 15–41)
Albumin: 3.4 g/dL — ABNORMAL LOW (ref 3.5–5.0)
BILIRUBIN TOTAL: 0.5 mg/dL (ref 0.3–1.2)
BUN: 36 mg/dL — ABNORMAL HIGH (ref 6–20)
CALCIUM: 8.2 mg/dL — AB (ref 8.9–10.3)
CO2: 28 mmol/L (ref 22–32)
Chloride: 89 mmol/L — ABNORMAL LOW (ref 101–111)
Creatinine, Ser: 2.08 mg/dL — ABNORMAL HIGH (ref 0.44–1.00)
GFR, EST AFRICAN AMERICAN: 25 mL/min — AB (ref >60)
GFR, EST NON AFRICAN AMERICAN: 22 mL/min — AB (ref >60)
GLUCOSE: 117 mg/dL — AB (ref 65–99)
Potassium: 3.9 mmol/L (ref 3.5–5.1)
Sodium: 128 mmol/L — ABNORMAL LOW (ref 135–145)
TOTAL PROTEIN: 7.5 g/dL (ref 6.5–8.1)

## 2016-02-07 LAB — CBC
HCT: 37.7 % (ref 36.0–46.0)
HEMOGLOBIN: 13 g/dL (ref 12.0–15.0)
MCH: 31 pg (ref 26.0–34.0)
MCHC: 34.5 g/dL (ref 30.0–36.0)
MCV: 89.8 fL (ref 78.0–100.0)
Platelets: 203 10*3/uL (ref 150–400)
RBC: 4.2 MIL/uL (ref 3.87–5.11)
RDW: 13.1 % (ref 11.5–15.5)
WBC: 8.9 10*3/uL (ref 4.0–10.5)

## 2016-02-07 LAB — URINE MICROSCOPIC-ADD ON

## 2016-02-07 LAB — LIPASE, BLOOD: Lipase: 38 U/L (ref 11–51)

## 2016-02-07 MED ORDER — ONDANSETRON HCL 4 MG/2ML IJ SOLN
4.0000 mg | Freq: Once | INTRAMUSCULAR | Status: AC
  Administered 2016-02-07: 4 mg via INTRAVENOUS
  Filled 2016-02-07: qty 2

## 2016-02-07 MED ORDER — SODIUM CHLORIDE 0.9 % IV BOLUS (SEPSIS)
500.0000 mL | Freq: Once | INTRAVENOUS | Status: AC
  Administered 2016-02-07: 500 mL via INTRAVENOUS

## 2016-02-07 MED ORDER — SODIUM CHLORIDE 0.9 % IV BOLUS (SEPSIS)
1000.0000 mL | Freq: Once | INTRAVENOUS | Status: AC
  Administered 2016-02-07: 1000 mL via INTRAVENOUS

## 2016-02-07 MED ORDER — PROMETHAZINE HCL 25 MG PO TABS: 12.5000 mg | ORAL_TABLET | Freq: Three times a day (TID) | ORAL | Status: DC | PRN

## 2016-02-07 NOTE — ED Notes (Signed)
C/o Nausea Friday and having loose stools since 4 am.  Had about 14 loose stools since 4 am. Denies any [ain.

## 2016-02-07 NOTE — ED Notes (Signed)
Patient states that she does not have to urinate at this time.

## 2016-02-07 NOTE — ED Notes (Signed)
Walked patient to restroom with standby assistance. Patient states that she is feeling unsteady and feeling a little short of breath. Patient states that she has been in bed for last 3 days.

## 2016-02-07 NOTE — Discharge Instructions (Signed)
Follow-up with Dr. Luan Pulling to have her kidney function rechecked. Return if you unable to tolerate orals. Take Zofran for nausea and Phenergan if that does not work.  Diarrhea Diarrhea is frequent loose and watery bowel movements. It can cause you to feel weak and dehydrated. Dehydration can cause you to become tired and thirsty, have a dry mouth, and have decreased urination that often is dark yellow. Diarrhea is a sign of another problem, most often an infection that will not last long. In most cases, diarrhea typically lasts 2-3 days. However, it can last longer if it is a sign of something more serious. It is important to treat your diarrhea as directed by your caregiver to lessen or prevent future episodes of diarrhea. CAUSES  Some common causes include:  Gastrointestinal infections caused by viruses, bacteria, or parasites.  Food poisoning or food allergies.  Certain medicines, such as antibiotics, chemotherapy, and laxatives.  Artificial sweeteners and fructose.  Digestive disorders. HOME CARE INSTRUCTIONS  Ensure adequate fluid intake (hydration): Have 1 cup (8 oz) of fluid for each diarrhea episode. Avoid fluids that contain simple sugars or sports drinks, fruit juices, whole milk products, and sodas. Your urine should be clear or pale yellow if you are drinking enough fluids. Hydrate with an oral rehydration solution that you can purchase at pharmacies, retail stores, and online. You can prepare an oral rehydration solution at home by mixing the following ingredients together:   - tsp table salt.   tsp baking soda.   tsp salt substitute containing potassium chloride.  1  tablespoons sugar.  1 L (34 oz) of water.  Certain foods and beverages may increase the speed at which food moves through the gastrointestinal (GI) tract. These foods and beverages should be avoided and include:  Caffeinated and alcoholic beverages.  High-fiber foods, such as raw fruits and vegetables,  nuts, seeds, and whole grain breads and cereals.  Foods and beverages sweetened with sugar alcohols, such as xylitol, sorbitol, and mannitol.  Some foods may be well tolerated and may help thicken stool including:  Starchy foods, such as rice, toast, pasta, low-sugar cereal, oatmeal, grits, baked potatoes, crackers, and bagels.  Bananas.  Applesauce.  Add probiotic-rich foods to help increase healthy bacteria in the GI tract, such as yogurt and fermented milk products.  Wash your hands well after each diarrhea episode.  Only take over-the-counter or prescription medicines as directed by your caregiver.  Take a warm bath to relieve any burning or pain from frequent diarrhea episodes. SEEK IMMEDIATE MEDICAL CARE IF:   You are unable to keep fluids down.  You have persistent vomiting.  You have blood in your stool, or your stools are black and tarry.  You do not urinate in 6-8 hours, or there is only a small amount of very dark urine.  You have abdominal pain that increases or localizes.  You have weakness, dizziness, confusion, or light-headedness.  You have a severe headache.  Your diarrhea gets worse or does not get better.  You have a fever or persistent symptoms for more than 2-3 days.  You have a fever and your symptoms suddenly get worse. MAKE SURE YOU:   Understand these instructions.  Will watch your condition.  Will get help right away if you are not doing well or get worse.   This information is not intended to replace advice given to you by your health care provider. Make sure you discuss any questions you have with your health care provider.  Document Released: 09/29/2002 Document Revised: 10/30/2014 Document Reviewed: 06/16/2012 Elsevier Interactive Patient Education [employer].

## 2016-02-07 NOTE — ED Provider Notes (Signed)
CSN: [id number]     Arrival date & time 02/07/16  1548 History   First MD Initiated Contact with Patient 02/07/16 1944     Chief Complaint  Patient presents with  . Nausea      The history is provided by the patient.   patient presents with nausea and diarrhea. She's had nausea for about the last week. No relief with Zofran at home. She states that starting today she has had diarrhea. She states she has had around 14 times today. States that is like chocolate pudding. No abdominal pain. States she feels weak. States she has had some nausea she was told before was from her eyedrops going down her throat. States that was around a month and a half ago but states the nausea is bad for the last week.  Past Medical History  Diagnosis Date  . Hypertension   . Asthma   . Elevated transaminase level   . GERD (gastroesophageal reflux disease)   . MVP (mitral valve prolapse)    Past Surgical History  Procedure Laterality Date  . Laparoscopic tubal ligation  1984  . Colonoscopy    . Upper gastrointestinal endoscopy    . Esophagogastroduodenoscopy (egd) with esophageal dilation N/A 03/07/2013    Procedure: ESOPHAGOGASTRODUODENOSCOPY (EGD) WITH ESOPHAGEAL DILATION;  Surgeon: Rogene Houston, MD;  [facility]: [facility];  Service: Endoscopy;  Laterality: N/A;  830  . Esophagogastroduodenoscopy N/A 04/15/2015    Procedure: ESOPHAGOGASTRODUODENOSCOPY (EGD);  Surgeon: Rogene Houston, MD;  Location: [facility];  Service: Endoscopy;  Laterality: N/A;  125 - moved to 1:00, Ann to notify pt  . Esophagogastroduodenoscopy N/A 07/30/2015    Procedure: ESOPHAGOGASTRODUODENOSCOPY (EGD);  Surgeon: Rogene Houston, MD;  Location: [facility];  Service: Endoscopy;  Laterality: N/A;  1040  . Esophageal dilation  07/30/2015    Procedure: ESOPHAGEAL DILATION;  Surgeon: Rogene Houston, MD;  Location: [facility];  Service: Endoscopy;;   Family History  Problem Relation Age of Onset  . Hypertension Mother    . Prostate cancer Father    Social History  Substance Use Topics  . Smoking status: Never Smoker   . Smokeless tobacco: Never Used  . Alcohol Use: No   OB History    No data available     Review of Systems  Constitutional: Positive for appetite change and fatigue. Negative for fever.  Respiratory: Negative for shortness of breath.   Cardiovascular: Negative for chest pain.  Gastrointestinal: Positive for nausea and diarrhea. Negative for vomiting and abdominal pain.  Genitourinary: Negative for enuresis.  Musculoskeletal: Negative for back pain.  Skin: Negative for wound.  Neurological: Positive for light-headedness.      Allergies  Apple; Aspirin; Avelox; Bee venom; Benadryl; Chicken allergy; Codeine; Factive; Factive; Iodine; Levaquin; Levofloxacin; Penicillins; Sulfa antibiotics; and Tequin  Home Medications   Prior to Admission medications   Medication Sig Start Date End Date Taking? Authorizing Provider  acetaminophen (TYLENOL) 500 MG tablet Take 1,000 mg by mouth every 6 (six) hours as needed for mild pain.   Yes Historical Provider, MD  albuterol (PROVENTIL HFA;VENTOLIN HFA) 108 (90 BASE) MCG/ACT inhaler Inhale 2 puffs into the lungs every 6 (six) hours as needed for wheezing or shortness of breath.   Yes Historical Provider, MD  albuterol (PROVENTIL) (2.5 MG/3ML) 0.083% nebulizer solution Take 2.5 mg by nebulization daily as needed for wheezing or shortness of breath.    Yes Historical Provider, MD  aliskiren (TEKTURNA) 150 MG tablet Take  150 mg by mouth 2 (two) times daily.    Yes Historical Provider, MD  ALPRAZolam Duanne Moron) 0.25 MG tablet Take 0.25 mg by mouth daily as needed for anxiety.  06/04/15  Yes Historical Provider, MD  Calcium-Vitamin D-Vitamin K (CALCIUM SOFT CHEWS PO) Take 1,000 mg by mouth.   Yes Historical Provider, MD  cholecalciferol (VITAMIN D) 1000 UNITS tablet Take 1,000 Units by mouth daily.    Yes Historical Provider, MD  Cyanocobalamin (VITAMIN B  12 PO) Take 2,000 mcg by mouth daily.   Yes Historical Provider, MD  diltiazem (CARDIZEM CD) 180 MG 24 hr capsule Take 180 mg by mouth 2 (two) times daily.    Yes Historical Provider, MD  erythromycin ophthalmic ointment Apply 1 application to eye 2 (two) times daily. 14 day course starting on 01/26/2016 01/26/16 02/09/16 Yes Historical Provider, MD  ezetimibe (ZETIA) 10 MG tablet Take 10 mg by mouth daily.   Yes Historical Provider, MD  fish oil-omega-3 fatty acids 1000 MG capsule Take 1 g by mouth 2 (two) times daily.    Yes Historical Provider, MD  Fluticasone-Salmeterol (ADVAIR) 250-50 MCG/DOSE AEPB Inhale 1 puff into the lungs every 12 (twelve) hours.    Yes Historical Provider, MD  folic acid (FOLVITE) A999333 MCG tablet Take 400 mcg by mouth daily.   Yes Historical Provider, MD  guanFACINE (TENEX) 1 MG tablet Take 1 mg by mouth at bedtime.     Yes Historical Provider, MD  hydrochlorothiazide (MICROZIDE) 12.5 MG capsule Take 12.5 mg by mouth daily. 01/11/16  Yes Historical Provider, MD  multivitamin-iron-minerals-folic acid (CENTRUM) chewable tablet Chew 1 tablet by mouth daily.   Yes Historical Provider, MD  omeprazole (PRILOSEC) 40 MG capsule Take 40 mg by mouth daily.   Yes Historical Provider, MD  ondansetron (ZOFRAN ODT) 4 MG disintegrating tablet Take 1 tablet (4 mg total) by mouth every 8 (eight) hours as needed for nausea. 07/03/15  Yes Tanna Furry, MD  potassium chloride (K-DUR) 10 MEQ tablet Take 10 mEq by mouth at bedtime.   Yes Historical Provider, MD  potassium chloride (KLOR-CON) 10 MEQ CR tablet Take 10 mEq by mouth daily.     Yes Historical Provider, MD  prednisoLONE acetate (PRED FORTE) 1 % ophthalmic suspension Place 1-2 drops into both eyes every hour while awake. 1 drop-Right eye, 2 drops-Left eye   Yes Historical Provider, MD  sodium chloride (MURO 128) 2 % ophthalmic solution Place 2 drops into both eyes 4 (four) times daily.    Yes Historical Provider, MD  promethazine (PHENERGAN) 25  MG tablet Take 0.5 tablets (12.5 mg total) by mouth every 8 (eight) hours as needed for refractory nausea / vomiting. 02/07/16   Davonna Belling, MD   BP 148/66 mmHg  Pulse 79  Temp(Src) 98.4 F (36.9 C) (Oral)  Resp 16  Ht 5\' 3"  (1.6 m)  Wt 136 lb (61.689 kg)  BMI 24.10 kg/m2  SpO2 93% Physical Exam  Constitutional: She appears well-developed.  HENT:  Head: Atraumatic.  Neck: Neck supple.  Cardiovascular: Normal rate.   Pulmonary/Chest: Effort normal.  Abdominal: Soft.  Musculoskeletal: Normal range of motion.  Neurological: She is alert.  Skin: Skin is warm.    ED Course  Procedures (including critical care time) Labs Review Labs Reviewed  COMPREHENSIVE METABOLIC PANEL - Abnormal; Notable for the following:    Sodium 128 (*)    Chloride 89 (*)    Glucose, Bld 117 (*)    BUN 36 (*)  Creatinine, Ser 2.08 (*)    Calcium 8.2 (*)    Albumin 3.4 (*)    ALT 13 (*)    GFR calc non Af Amer 22 (*)    GFR calc Af Amer 25 (*)    All other components within normal limits  LIPASE, BLOOD  CBC  URINALYSIS, ROUTINE W REFLEX MICROSCOPIC (NOT AT [facility])    Imaging Review No results found. I have personally reviewed and evaluated these images and lab results as part of my medical decision-making.   EKG Interpretation None      MDM   Final diagnoses:  Nausea  Diarrhea, unspecified type  Renal insufficiency    Patient with nausea and diarrhea. Creatinine is mildly increased up to 2. Patient feels somewhat better after IV fluids. Mild hyponatremia. We will give oral trial and if she continues this and urine is reassuring may be able discharge home to follow-up with Dr. Luan Pulling in 2-3 days.    Davonna Belling, MD 02/07/16 2219

## 2016-02-08 ENCOUNTER — Encounter (HOSPITAL_COMMUNITY): Payer: Self-pay | Admitting: *Deleted

## 2016-02-08 DIAGNOSIS — N183 Chronic kidney disease, stage 3 (moderate): Secondary | ICD-10-CM | POA: Diagnosis present

## 2016-02-08 DIAGNOSIS — R112 Nausea with vomiting, unspecified: Secondary | ICD-10-CM | POA: Diagnosis not present

## 2016-02-08 DIAGNOSIS — A020 Salmonella enteritis: Secondary | ICD-10-CM | POA: Diagnosis not present

## 2016-02-08 DIAGNOSIS — R197 Diarrhea, unspecified: Secondary | ICD-10-CM | POA: Diagnosis not present

## 2016-02-08 DIAGNOSIS — R110 Nausea: Secondary | ICD-10-CM | POA: Diagnosis not present

## 2016-02-08 DIAGNOSIS — K219 Gastro-esophageal reflux disease without esophagitis: Secondary | ICD-10-CM

## 2016-02-08 DIAGNOSIS — F411 Generalized anxiety disorder: Secondary | ICD-10-CM | POA: Diagnosis present

## 2016-02-08 DIAGNOSIS — J45909 Unspecified asthma, uncomplicated: Secondary | ICD-10-CM | POA: Diagnosis present

## 2016-02-08 DIAGNOSIS — Z8249 Family history of ischemic heart disease and other diseases of the circulatory system: Secondary | ICD-10-CM | POA: Diagnosis not present

## 2016-02-08 DIAGNOSIS — N289 Disorder of kidney and ureter, unspecified: Secondary | ICD-10-CM | POA: Diagnosis not present

## 2016-02-08 DIAGNOSIS — E876 Hypokalemia: Secondary | ICD-10-CM | POA: Diagnosis present

## 2016-02-08 DIAGNOSIS — E871 Hypo-osmolality and hyponatremia: Secondary | ICD-10-CM | POA: Diagnosis present

## 2016-02-08 DIAGNOSIS — J329 Chronic sinusitis, unspecified: Secondary | ICD-10-CM | POA: Diagnosis present

## 2016-02-08 DIAGNOSIS — N179 Acute kidney failure, unspecified: Secondary | ICD-10-CM | POA: Diagnosis not present

## 2016-02-08 DIAGNOSIS — E860 Dehydration: Secondary | ICD-10-CM | POA: Diagnosis present

## 2016-02-08 DIAGNOSIS — Z947 Corneal transplant status: Secondary | ICD-10-CM | POA: Diagnosis not present

## 2016-02-08 DIAGNOSIS — Z809 Family history of malignant neoplasm, unspecified: Secondary | ICD-10-CM | POA: Diagnosis not present

## 2016-02-08 DIAGNOSIS — I129 Hypertensive chronic kidney disease with stage 1 through stage 4 chronic kidney disease, or unspecified chronic kidney disease: Secondary | ICD-10-CM | POA: Diagnosis present

## 2016-02-08 LAB — CBC
HCT: 32.5 % — ABNORMAL LOW (ref 36.0–46.0)
HEMOGLOBIN: 11.2 g/dL — AB (ref 12.0–15.0)
MCH: 31 pg (ref 26.0–34.0)
MCHC: 34.5 g/dL (ref 30.0–36.0)
MCV: 90 fL (ref 78.0–100.0)
PLATELETS: 165 10*3/uL (ref 150–400)
RBC: 3.61 MIL/uL — ABNORMAL LOW (ref 3.87–5.11)
RDW: 13.1 % (ref 11.5–15.5)
WBC: 6.2 10*3/uL (ref 4.0–10.5)

## 2016-02-08 LAB — BASIC METABOLIC PANEL
ANION GAP: 8 (ref 5–15)
BUN: 27 mg/dL — ABNORMAL HIGH (ref 6–20)
CALCIUM: 7.5 mg/dL — AB (ref 8.9–10.3)
CO2: 25 mmol/L (ref 22–32)
Chloride: 99 mmol/L — ABNORMAL LOW (ref 101–111)
Creatinine, Ser: 1.65 mg/dL — ABNORMAL HIGH (ref 0.44–1.00)
GFR, EST AFRICAN AMERICAN: 33 mL/min — AB (ref >60)
GFR, EST NON AFRICAN AMERICAN: 29 mL/min — AB (ref >60)
GLUCOSE: 121 mg/dL — AB (ref 65–99)
Potassium: 3.7 mmol/L (ref 3.5–5.1)
Sodium: 132 mmol/L — ABNORMAL LOW (ref 135–145)

## 2016-02-08 LAB — C DIFFICILE QUICK SCREEN W PCR REFLEX
C DIFFICILE (CDIFF) TOXIN: NEGATIVE
C Diff antigen: POSITIVE — AB

## 2016-02-08 MED ORDER — GUANFACINE HCL 1 MG PO TABS
1.0000 mg | ORAL_TABLET | Freq: Every day | ORAL | Status: DC
  Administered 2016-02-08 – 2016-02-11 (×5): 1 mg via ORAL
  Filled 2016-02-08 (×7): qty 1

## 2016-02-08 MED ORDER — ALBUTEROL SULFATE (2.5 MG/3ML) 0.083% IN NEBU: 2.5000 mg | INHALATION_SOLUTION | Freq: Every day | RESPIRATORY_TRACT | Status: DC | PRN

## 2016-02-08 MED ORDER — ALISKIREN FUMARATE 150 MG PO TABS
300.0000 mg | ORAL_TABLET | Freq: Every day | ORAL | Status: DC
  Administered 2016-02-09 – 2016-02-12 (×4): 300 mg via ORAL
  Filled 2016-02-08 (×6): qty 2

## 2016-02-08 MED ORDER — FLUTICASONE PROPIONATE 50 MCG/ACT NA SUSP
2.0000 | Freq: Every day | NASAL | Status: DC
  Administered 2016-02-08 – 2016-02-12 (×5): 2 via NASAL
  Filled 2016-02-08 (×2): qty 16

## 2016-02-08 MED ORDER — FLUTICASONE FUROATE-VILANTEROL 200-25 MCG/INH IN AEPB
1.0000 | INHALATION_SPRAY | Freq: Every day | RESPIRATORY_TRACT | Status: DC
Start: 2016-02-08 — End: 2016-02-12
  Administered 2016-02-08 – 2016-02-12 (×4): 1 via RESPIRATORY_TRACT
  Filled 2016-02-08: qty 28

## 2016-02-08 MED ORDER — ACETAMINOPHEN 500 MG PO TABS
1000.0000 mg | ORAL_TABLET | Freq: Four times a day (QID) | ORAL | Status: DC | PRN
  Administered 2016-02-09: 1000 mg via ORAL
  Filled 2016-02-08: qty 2

## 2016-02-08 MED ORDER — ONDANSETRON 4 MG PO TBDP: 4.0000 mg | ORAL_TABLET | Freq: Three times a day (TID) | ORAL | Status: DC | PRN

## 2016-02-08 MED ORDER — SODIUM CHLORIDE 0.9 % IV SOLN
INTRAVENOUS | Status: AC
  Administered 2016-02-08: 100 mL/h via INTRAVENOUS

## 2016-02-08 MED ORDER — PREDNISOLONE ACETATE 1 % OP SUSP
1.0000 [drp] | Freq: Every day | OPHTHALMIC | Status: DC
  Administered 2016-02-09 – 2016-02-11 (×3): 1 [drp] via OPHTHALMIC
  Filled 2016-02-08: qty 1

## 2016-02-08 MED ORDER — PREDNISOLONE ACETATE 1 % OP SUSP: 1.0000 [drp] | OPHTHALMIC | Status: DC

## 2016-02-08 MED ORDER — DILTIAZEM HCL ER COATED BEADS 180 MG PO CP24
360.0000 mg | ORAL_CAPSULE | Freq: Every day | ORAL | Status: DC
  Administered 2016-02-09 – 2016-02-12 (×4): 360 mg via ORAL
  Filled 2016-02-08 (×4): qty 2

## 2016-02-08 MED ORDER — DILTIAZEM HCL ER COATED BEADS 180 MG PO CP24
180.0000 mg | ORAL_CAPSULE | Freq: Two times a day (BID) | ORAL | Status: DC
  Administered 2016-02-08: 180 mg via ORAL
  Filled 2016-02-08 (×5): qty 1

## 2016-02-08 MED ORDER — SALINE SPRAY 0.65 % NA SOLN
1.0000 | NASAL | Status: DC | PRN
Start: 2016-02-08 — End: 2016-02-12

## 2016-02-08 MED ORDER — ALISKIREN FUMARATE 150 MG PO TABS
150.0000 mg | ORAL_TABLET | Freq: Two times a day (BID) | ORAL | Status: DC
  Administered 2016-02-08: 150 mg via ORAL
  Filled 2016-02-08 (×4): qty 1

## 2016-02-08 MED ORDER — ONDANSETRON HCL 4 MG PO TABS
4.0000 mg | ORAL_TABLET | Freq: Four times a day (QID) | ORAL | Status: DC | PRN
  Administered 2016-02-12: 4 mg via ORAL
  Filled 2016-02-08: qty 1

## 2016-02-08 MED ORDER — POTASSIUM CHLORIDE ER 10 MEQ PO TBCR
10.0000 meq | EXTENDED_RELEASE_TABLET | Freq: Every day | ORAL | Status: DC
  Administered 2016-02-08 – 2016-02-09 (×3): 10 meq via ORAL
  Filled 2016-02-08 (×6): qty 1

## 2016-02-08 MED ORDER — PANTOPRAZOLE SODIUM 40 MG IV SOLR
40.0000 mg | INTRAVENOUS | Status: DC
  Administered 2016-02-08 – 2016-02-10 (×3): 40 mg via INTRAVENOUS
  Filled 2016-02-08 (×3): qty 40

## 2016-02-08 MED ORDER — ALBUTEROL SULFATE (2.5 MG/3ML) 0.083% IN NEBU: 2.5000 mg | INHALATION_SOLUTION | Freq: Four times a day (QID) | RESPIRATORY_TRACT | Status: DC | PRN

## 2016-02-08 MED ORDER — ONDANSETRON HCL 4 MG/2ML IJ SOLN
4.0000 mg | Freq: Four times a day (QID) | INTRAMUSCULAR | Status: DC | PRN
  Administered 2016-02-08 – 2016-02-09 (×2): 4 mg via INTRAVENOUS
  Filled 2016-02-08 (×2): qty 2

## 2016-02-08 MED ORDER — ALPRAZOLAM 0.25 MG PO TABS: 0.2500 mg | ORAL_TABLET | Freq: Every day | ORAL | Status: DC | PRN

## 2016-02-08 MED ORDER — SODIUM CHLORIDE 0.9 % IV SOLN
INTRAVENOUS | Status: AC
  Administered 2016-02-08: 02:00:00 via INTRAVENOUS

## 2016-02-08 MED ORDER — PREDNISOLONE ACETATE 1 % OP SUSP
OPHTHALMIC | Status: AC
  Filled 2016-02-08: qty 5

## 2016-02-08 MED ORDER — FOLIC ACID 1 MG PO TABS
500.0000 ug | ORAL_TABLET | Freq: Every day | ORAL | Status: DC
  Administered 2016-02-08 – 2016-02-12 (×5): 0.5 mg via ORAL
  Filled 2016-02-08: qty 1
  Filled 2016-02-08 (×2): qty 0.5
  Filled 2016-02-08 (×4): qty 1
  Filled 2016-02-08 (×2): qty 0.5
  Filled 2016-02-08: qty 1
  Filled 2016-02-08: qty 0.5
  Filled 2016-02-08: qty 1

## 2016-02-08 MED ORDER — ALBUTEROL SULFATE HFA 108 (90 BASE) MCG/ACT IN AERS
2.0000 | INHALATION_SPRAY | Freq: Four times a day (QID) | RESPIRATORY_TRACT | Status: DC | PRN
  Filled 2016-02-08: qty 6.7

## 2016-02-08 NOTE — H&P (Signed)
PCP:   Alonza Bogus, MD   Chief Complaint:  Nausea and diarrhea  HPI: 75 yo female who has not felt well all weekend.  She started with a lot of nausea on Friday and all day sat/sun with no vomiting.  Today she started with diarrhea at least 15 times.  No abdominal pain.  No bleeding.  No fevers.  No sick contacts.  Pt today got very weak and this is what prompted her coming to the ED.  She has not had any further diarrhea since arrival and is feeling much better.  She has AKI from her volume losses and was referred for admission for ivf.  Review of Systems:  Positive and negative as per HPI otherwise all other systems are negative  Past Medical History: Past Medical History  Diagnosis Date  . Hypertension   . Asthma   . Elevated transaminase level   . GERD (gastroesophageal reflux disease)   . MVP (mitral valve prolapse)    Past Surgical History  Procedure Laterality Date  . Laparoscopic tubal ligation  1984  . Colonoscopy    . Upper gastrointestinal endoscopy    . Esophagogastroduodenoscopy (egd) with esophageal dilation N/A 03/07/2013    Procedure: ESOPHAGOGASTRODUODENOSCOPY (EGD) WITH ESOPHAGEAL DILATION;  Surgeon: Rogene Houston, MD;  [facility]: [facility];  Service: Endoscopy;  Laterality: N/A;  830  . Esophagogastroduodenoscopy N/A 04/15/2015    Procedure: ESOPHAGOGASTRODUODENOSCOPY (EGD);  Surgeon: Rogene Houston, MD;  [facility];  Service: Endoscopy;  Laterality: N/A;  125 - moved to 1:00, Ann to notify pt  . Esophagogastroduodenoscopy N/A 07/30/2015    Procedure: ESOPHAGOGASTRODUODENOSCOPY (EGD);  Surgeon: Rogene Houston, MD;  [facility];  Service: Endoscopy;  Laterality: N/A;  1040  . Esophageal dilation  07/30/2015    Procedure: ESOPHAGEAL DILATION;  Surgeon: Rogene Houston, MD;  [facility]: [facility];  Service: Endoscopy;;    Medications: Prior to Admission medications   Medication Sig Start Date End Date Taking? Authorizing  Provider  acetaminophen (TYLENOL) 500 MG tablet Take 1,000 mg by mouth every 6 (six) hours as needed for mild pain.   Yes Historical Provider, MD  albuterol (PROVENTIL HFA;VENTOLIN HFA) 108 (90 BASE) MCG/ACT inhaler Inhale 2 puffs into the lungs every 6 (six) hours as needed for wheezing or shortness of breath.   Yes Historical Provider, MD  albuterol (PROVENTIL) (2.5 MG/3ML) 0.083% nebulizer solution Take 2.5 mg by nebulization daily as needed for wheezing or shortness of breath.    Yes Historical Provider, MD  aliskiren (TEKTURNA) 150 MG tablet Take 150 mg by mouth 2 (two) times daily.    Yes Historical Provider, MD  ALPRAZolam Duanne Moron) 0.25 MG tablet Take 0.25 mg by mouth daily as needed for anxiety.  06/04/15  Yes Historical Provider, MD  Calcium-Vitamin D-Vitamin K (CALCIUM SOFT CHEWS PO) Take 1,000 mg by mouth.   Yes Historical Provider, MD  cholecalciferol (VITAMIN D) 1000 UNITS tablet Take 1,000 Units by mouth daily.    Yes Historical Provider, MD  Cyanocobalamin (VITAMIN B 12 PO) Take 2,000 mcg by mouth daily.   Yes Historical Provider, MD  diltiazem (CARDIZEM CD) 180 MG 24 hr capsule Take 180 mg by mouth 2 (two) times daily.    Yes Historical Provider, MD  erythromycin ophthalmic ointment Apply 1 application to eye 2 (two) times daily. 14 day course starting on 01/26/2016 01/26/16 02/09/16 Yes Historical Provider, MD  ezetimibe (ZETIA) 10 MG tablet Take 10 mg by mouth daily.  Yes Historical Provider, MD  fish oil-omega-3 fatty acids 1000 MG capsule Take 1 g by mouth 2 (two) times daily.    Yes Historical Provider, MD  Fluticasone-Salmeterol (ADVAIR) 250-50 MCG/DOSE AEPB Inhale 1 puff into the lungs every 12 (twelve) hours.    Yes Historical Provider, MD  folic acid (FOLVITE) A999333 MCG tablet Take 400 mcg by mouth daily.   Yes Historical Provider, MD  guanFACINE (TENEX) 1 MG tablet Take 1 mg by mouth at bedtime.     Yes Historical Provider, MD  hydrochlorothiazide (MICROZIDE) 12.5 MG capsule Take  12.5 mg by mouth daily. 01/11/16  Yes Historical Provider, MD  multivitamin-iron-minerals-folic acid (CENTRUM) chewable tablet Chew 1 tablet by mouth daily.   Yes Historical Provider, MD  omeprazole (PRILOSEC) 40 MG capsule Take 40 mg by mouth daily.   Yes Historical Provider, MD  ondansetron (ZOFRAN ODT) 4 MG disintegrating tablet Take 1 tablet (4 mg total) by mouth every 8 (eight) hours as needed for nausea. 07/03/15  Yes Tanna Furry, MD  potassium chloride (K-DUR) 10 MEQ tablet Take 10 mEq by mouth at bedtime.   Yes Historical Provider, MD  potassium chloride (KLOR-CON) 10 MEQ CR tablet Take 10 mEq by mouth daily.     Yes Historical Provider, MD  prednisoLONE acetate (PRED FORTE) 1 % ophthalmic suspension Place 1-2 drops into both eyes every hour while awake. 1 drop-Right eye, 2 drops-Left eye   Yes Historical Provider, MD  sodium chloride (MURO 128) 2 % ophthalmic solution Place 2 drops into both eyes 4 (four) times daily.    Yes Historical Provider, MD  promethazine (PHENERGAN) 25 MG tablet Take 0.5 tablets (12.5 mg total) by mouth every 8 (eight) hours as needed for refractory nausea / vomiting. 02/07/16   Davonna Belling, MD    Allergies:   Allergies  Allergen Reactions  . Apple Other (See Comments)    Wheezing and Asthma Symptoms   . Aspirin Other (See Comments)    Wheezing, Asthma Symptoms   . Avelox [Moxifloxacin Hcl In Nacl] Hives  . Bee Venom Other (See Comments)    Wheezing, Asthma Symptoms  . Benadryl [Diphenhydramine Hcl] Other (See Comments)    Wheezing, Asthma Symptoms   . Chicken Allergy Other (See Comments)    Wheezing and Asthma Symptoms   . Codeine Other (See Comments)    Makes patient feel like she's going to pass out.   Haze Boyden [Gemifloxacin Mesylate] Hives  . Factive [Gemifloxacin] Hives  . Iodine   . Levaquin [Levofloxacin In D5w] Hives  . Levofloxacin Hives  . Penicillins Other (See Comments)    Asthma Symptoms  . Sulfa Antibiotics Nausea Only  . Tequin  [Gatifloxacin] Hives    Social History:  reports that she has never smoked. She has never used smokeless tobacco. She reports that she does not drink alcohol or use illicit drugs.  Family History: Family History  Problem Relation Age of Onset  . Hypertension Mother   . Prostate cancer Father     Physical Exam: Filed Vitals:   02/07/16 2020 02/07/16 2200 02/07/16 2224 02/08/16 0046  BP: 148/66 133/59 176/68 139/62  Pulse: 79  84 107  Temp:      TempSrc:      Resp: 16  22 21   Height:      Weight:      SpO2: 93%  97% 100%   General appearance: alert, cooperative and no distress Head: Normocephalic, without obvious abnormality, atraumatic Eyes: negative Nose: Nares normal. Septum  midline. Mucosa normal. No drainage or sinus tenderness. Neck: no JVD and supple, symmetrical, trachea midline Lungs: clear to auscultation bilaterally Heart: regular rate and rhythm, S1, S2 normal, no murmur, click, rub or gallop Abdomen: soft, non-tender; bowel sounds normal; no masses,  no organomegaly Extremities: extremities normal, atraumatic, no cyanosis or edema Pulses: 2+ and symmetric Skin: Skin color, texture, turgor normal. No rashes or lesions Neurologic: Grossly normal    Labs on Admission:   Recent Labs  02/07/16 1710  NA 128*  K 3.9  CL 89*  CO2 28  GLUCOSE 117*  BUN 36*  CREATININE 2.08*  CALCIUM 8.2*    Recent Labs  02/07/16 1710  AST 24  ALT 13*  ALKPHOS 61  BILITOT 0.5  PROT 7.5  ALBUMIN 3.4*    Recent Labs  02/07/16 1710  LIPASE 38    Recent Labs  02/07/16 1710  WBC 8.9  HGB 13.0  HCT 37.7  MCV 89.8  PLT 203   Radiological Exams on Admission: No results found.  Assessment/Plan  75 yo female with likely viral gastroenteritis with AKI  Principal Problem:   Acute renal failure (ARF) ([place])- due to VGE.  Given almost 2 liters ivf bolus in ED.  Cont ivf overnight.  repeet cr in am.  Can likely go home later today if no further episodes of  diarrhea.  Active Problems:   GERD (gastroesophageal reflux disease)   HTN (hypertension)   Nausea,and diarrhea-  Seems to have resolved.  Treat AKI  obs on medical.  Full code.  PCP dr Luan Pulling.  Coulton Schlink A 02/08/2016, 12:56 AM

## 2016-02-08 NOTE — Consult Note (Signed)
Referring Provider: Alonza Bogus, M.D.  Primary Care Physician:  Alonza Bogus, MD Primary Gastroenterologist:  Dr. Laural Golden  Reason for Consultation:    Intractable nausea of 4 weeks duration with anorexia and weight loss. Recent onset of nonbloody diarrhea.  HPI:   Patient is 75 year old Caucasian female who has not been doing well for about 4 weeks since she had partial left corneal transplant on 01/03/2016. She had been on 2 topical antibiotics and steroid eyedrops. She developed intractable nausea with inability to 8 and lost 8 pounds. As the dose of medications was reduce she thought she was getting better. She woke up yesterday around 4 AM and began to have diarrhea. She had 14 stools over 10 hours. Stool consistency started with soft mushy stools and became looser. She felt weak. She came to emergency room for evaluation. She was noted to be dehydrated and hospitalized. Stool studies have been ordered but she has not had any more bowel movements. She is taking sips of liquids. She denies melena or rectal bleeding fever or chills. She has noted some abdominal cramping which is relieved with defecation. She states her baseline is bowel movement daily or every other day. If anything she is prone to constipation. She believes her nausea and anorexia is due to eye medications. She was also diagnosed with sinusitis but Dr. Luan Pulling decided to hold off therapy given that she is on IV medications. Patient was evaluated in September last year with nausea epigastric pain and weight loss. She had multiple studies including ultrasound negative for cholelithiasis HIDA scan with EF of 94% unremarkable abdominopelvic CT other than colonic diverticulosis and atherosclerotic changes aorta. EGD revealed high-grade chasse's ring which is disrupted and moderate-sized sliding hiatal hernia. She also has gastritis but biopsy was negative for H. pylori. Patient states that she was she was Cipro and her nausea  resolved. She had no difficulty until 3 or 4 days after eye surgery. Last year she had partial right corneal transplant and developed symptoms within few days of this surgery. She denies dysphagia or heaving. She feels heartburn is well controlled with omeprazole although she forgot to take it for 2 days and experienced heartburn over the weekend. She has blurred vision involving left eye. She states she has not fully recovered from eye surgery. She is retired. She lives with her husband and [place]. She has never smoked cigarettes and does not drink alcohol. Her daughter [name] is at bedside. Last colonoscopy was in 2005 revealing pancolonic diverticulosis. She has undergone EGD with dilation and September 1988, March 1993 as well as in May 2014 June 2016 and most recent EGD was in October 2016.    Past Medical History  Diagnosis Date  . Hypertension   . Asthma   . Elevated transaminase level   . GERD (gastroesophageal reflux disease)   . MVP (mitral valve prolapse)     Past Surgical History  Procedure Laterality Date  . Laparoscopic tubal ligation  1984  . Colonoscopy    . Upper gastrointestinal endoscopy    . Esophagogastroduodenoscopy (egd) with esophageal dilation N/A 03/07/2013    Procedure: ESOPHAGOGASTRODUODENOSCOPY (EGD) WITH ESOPHAGEAL DILATION;  Surgeon: Rogene Houston, MD;  Location: [facility];  Service: Endoscopy;  Laterality: N/A;  830  . Esophagogastroduodenoscopy N/A 04/15/2015    Procedure: ESOPHAGOGASTRODUODENOSCOPY (EGD);  Surgeon: Rogene Houston, MD;  Location: [facility];  Service: Endoscopy;  Laterality: N/A;  125 - moved to 1:00, Ann to notify pt  . Esophagogastroduodenoscopy N/A  07/30/2015    Procedure: ESOPHAGOGASTRODUODENOSCOPY (EGD);  Surgeon: Rogene Houston, MD;  Location: [facility];  Service: Endoscopy;  Laterality: N/A;  1040  . Esophageal dilation  07/30/2015    Procedure: ESOPHAGEAL DILATION;  Surgeon: Rogene Houston, MD;  Location: [facility];  Service: Endoscopy;;    Prior to Admission medications   Medication Sig Start Date End Date Taking? Authorizing Provider  acetaminophen (TYLENOL) 500 MG tablet Take 1,000 mg by mouth every 6 (six) hours as needed for mild pain.   Yes Historical Provider, MD  albuterol (PROVENTIL HFA;VENTOLIN HFA) 108 (90 BASE) MCG/ACT inhaler Inhale 2 puffs into the lungs every 6 (six) hours as needed for wheezing or shortness of breath.   Yes Historical Provider, MD  albuterol (PROVENTIL) (2.5 MG/3ML) 0.083% nebulizer solution Take 2.5 mg by nebulization daily as needed for wheezing or shortness of breath.    Yes Historical Provider, MD  aliskiren (TEKTURNA) 150 MG tablet Take 150 mg by mouth 2 (two) times daily.    Yes Historical Provider, MD  ALPRAZolam Duanne Moron) 0.25 MG tablet Take 0.25 mg by mouth daily as needed for anxiety.  06/04/15  Yes Historical Provider, MD  Calcium-Vitamin D-Vitamin K (CALCIUM SOFT CHEWS PO) Take 1,000 mg by mouth.   Yes Historical Provider, MD  cholecalciferol (VITAMIN D) 1000 UNITS tablet Take 1,000 Units by mouth daily.    Yes Historical Provider, MD  Cyanocobalamin (VITAMIN B 12 PO) Take 2,000 mcg by mouth daily.   Yes Historical Provider, MD  diltiazem (CARDIZEM CD) 180 MG 24 hr capsule Take 180 mg by mouth 2 (two) times daily.    Yes Historical Provider, MD  erythromycin ophthalmic ointment Apply 1 application to eye 2 (two) times daily. 14 day course starting on 01/26/2016 01/26/16 02/09/16 Yes Historical Provider, MD  ezetimibe (ZETIA) 10 MG tablet Take 10 mg by mouth daily.   Yes Historical Provider, MD  fish oil-omega-3 fatty acids 1000 MG capsule Take 1 g by mouth 2 (two) times daily.    Yes Historical Provider, MD  Fluticasone-Salmeterol (ADVAIR) 250-50 MCG/DOSE AEPB Inhale 1 puff into the lungs every 12 (twelve) hours.    Yes Historical Provider, MD  folic acid (FOLVITE) [id number] MCG tablet Take 400 mcg by mouth daily.   Yes Historical Provider, MD  guanFACINE (TENEX) 1 MG  tablet Take 1 mg by mouth at bedtime.     Yes Historical Provider, MD  hydrochlorothiazide (MICROZIDE) 12.5 MG capsule Take 12.5 mg by mouth daily. 01/11/16  Yes Historical Provider, MD  multivitamin-iron-minerals-folic acid (CENTRUM) chewable tablet Chew 1 tablet by mouth daily.   Yes Historical Provider, MD  omeprazole (PRILOSEC) 40 MG capsule Take 40 mg by mouth daily.   Yes Historical Provider, MD  ondansetron (ZOFRAN ODT) 4 MG disintegrating tablet Take 1 tablet (4 mg total) by mouth every 8 (eight) hours as needed for nausea. 07/03/15  Yes Tanna Furry, MD  potassium chloride (K-DUR) 10 MEQ tablet Take 10 mEq by mouth at bedtime.   Yes Historical Provider, MD  potassium chloride (KLOR-CON) 10 MEQ CR tablet Take 10 mEq by mouth daily.     Yes Historical Provider, MD  prednisoLONE acetate (PRED FORTE) 1 % ophthalmic suspension Place 1-2 drops into both eyes every hour while awake. 1 drop-Right eye, 2 drops-Left eye   Yes Historical Provider, MD  sodium chloride (MURO 128) 2 % ophthalmic solution Place 2 drops into both eyes 4 (four) times daily.    Yes Historical Provider,  MD  promethazine (PHENERGAN) 25 MG tablet Take 0.5 tablets (12.5 mg total) by mouth every 8 (eight) hours as needed for refractory nausea / vomiting. 02/07/16   Davonna Belling, MD    Current Facility-Administered Medications  Medication Dose Route Frequency Provider Last Rate Last Dose  . 0.9 %  sodium chloride infusion   Intravenous STAT Ezequiel Essex, MD 100 mL/hr at 02/08/16 0139    . 0.9 %  sodium chloride infusion   Intravenous STAT Sinda Du, MD 100 mL/hr at 02/08/16 0830 100 mL/hr at 02/08/16 0830  . acetaminophen (TYLENOL) tablet 1,000 mg  1,000 mg Oral Q6H PRN Phillips Grout, MD      . albuterol (PROVENTIL) (2.5 MG/3ML) 0.083% nebulizer solution 2.5 mg  2.5 mg Nebulization Q6H PRN Phillips Grout, MD      . Derrill Memo ON 02/09/2016] aliskiren (TEKTURNA) tablet 300 mg  300 mg Oral Daily Sinda Du, MD      .  ALPRAZolam Duanne Moron) tablet 0.25 mg  0.25 mg Oral Daily PRN Phillips Grout, MD      . Derrill Memo ON 02/09/2016] diltiazem (CARDIZEM CD) 24 hr capsule 360 mg  360 mg Oral Daily Sinda Du, MD      . fluticasone (FLONASE) 50 MCG/ACT nasal spray 2 spray  2 spray Each Nare Daily Sinda Du, MD      . fluticasone furoate-vilanterol (BREO ELLIPTA) 200-25 MCG/INH 1 puff  1 puff Inhalation Daily Sinda Du, MD   1 puff at 02/08/16 1037  . folic acid (FOLVITE) tablet 0.5 mg  500 mcg Oral Daily Sinda Du, MD      . guanFACINE (TENEX) tablet 1 mg  1 mg Oral QHS Phillips Grout, MD   1 mg at 02/08/16 0236  . ondansetron (ZOFRAN) tablet 4 mg  4 mg Oral Q6H PRN Phillips Grout, MD       Or  . ondansetron [facility]) injection 4 mg  4 mg Intravenous Q6H PRN Phillips Grout, MD   4 mg at 02/08/16 0606  . ondansetron (ZOFRAN-ODT) disintegrating tablet 4 mg  4 mg Oral Q8H PRN Phillips Grout, MD      . pantoprazole (PROTONIX) injection 40 mg  40 mg Intravenous Q24H Sinda Du, MD   40 mg at 02/08/16 [id number]  . potassium chloride (K-DUR) CR tablet 10 mEq  10 mEq Oral QHS Phillips Grout, MD   10 mEq at 02/08/16 0236  . [START ON 02/09/2016] prednisoLONE acetate (PRED FORTE) 1 % ophthalmic suspension 1 drop  1 drop Both Eyes Daily Sinda Du, MD      . sodium chloride (OCEAN) 0.65 % nasal spray 1 spray  1 spray Each Nare PRN Sinda Du, MD        Allergies as of 02/07/2016 - Review Complete 02/07/2016  Allergen Reaction Noted  . Apple Other (See Comments) 07/03/2015  . Aspirin Other (See Comments) 03/14/2011  . Avelox [moxifloxacin hcl in nacl] Hives 02/19/2013  . Bee venom Other (See Comments) 10/25/2013  . Benadryl [diphenhydramine hcl] Other (See Comments) 03/14/2011  . Chicken allergy Other (See Comments) 10/25/2013  . Codeine Other (See Comments) 10/25/2013  . Factive [gemifloxacin mesylate] Hives 03/14/2011  . Factive [gemifloxacin] Hives 02/19/2013  . Iodine  02/07/2016  . Levaquin  [levofloxacin in d5w] Hives 02/19/2013  . Levofloxacin Hives 03/14/2011  . Penicillins Other (See Comments) 03/14/2011  . Sulfa antibiotics Nausea Only 03/14/2011  . Tequin [gatifloxacin] Hives 02/19/2013    Family History  Problem Relation  Age of Onset  . Hypertension Mother   . Prostate cancer Father     Social History   Social History  . Marital Status: Married    Spouse Name: N/A  . Number of Children: N/A  . Years of Education: N/A   Occupational History  . Not on file.   Social History Main Topics  . Smoking status: Never Smoker   . Smokeless tobacco: Never Used  . Alcohol Use: No  . Drug Use: No  . Sexual Activity: Not on file   Other Topics Concern  . Not on file   Social History Narrative    Review of Systems: See HPI, otherwise normal ROS  Physical Exam: Temp:  [98.1 F (36.7 C)-98.9 F (37.2 C)] 98.9 F (37.2 C) (04/18 0500) Pulse Rate:  [79-107] 87 (04/18 0500) Resp:  [16-22] 20 (04/18 0500) BP: (127-176)/(59-96) 140/60 mmHg (04/18 0500) SpO2:  [92 %-100 %] 94 % (04/18 1038) Weight:  [135 lb 11.2 oz (61.553 kg)-136 lb (61.689 kg)] 135 lb 11.2 oz (61.553 kg) (04/18 0153) Last BM Date: 02/07/16 Patient is alert and in no acute distress. Conjunctiva is pink. Sclerae nonicteric. Oropharyngeal mucosa is normal. No neck masses or thyromegaly noted. Cardiac exam with regular rhythm normal S1 and S2. No murmur or gallop noted. Lungs are clear to auscultation. Abdomen is symmetrical with normal bowel sounds. On palpation it is soft and nontender without organomegaly or masses. No peripheral edema or clubbing noted.     Lab Results:  Recent Labs  02/07/16 1710 02/08/16 0416  WBC 8.9 6.2  HGB 13.0 11.2*  HCT 37.7 32.5*  PLT 203 165   BMET  Recent Labs  02/07/16 1710 02/08/16 0416  NA 128* 132*  K 3.9 3.7  CL 89* 99*  CO2 28 25  GLUCOSE 117* 121*  BUN 36* 27*  CREATININE 2.08* 1.65*  CALCIUM 8.2* 7.5*   LFT  Recent Labs   02/07/16 1710  PROT 7.5  ALBUMIN 3.4*  AST 24  ALT 13*  ALKPHOS 61  BILITOT 0.5    Assessment; Patient is 75 year old Caucasian female who presents with acute onset of copious diarrhea resulting in dehydration and azotemia and she is improving with IV hydration and stool sample has not been collected and she has not had a BM since admission. Acute diarrhea is most likely to gastroenteritis. I do not believe she has C. difficile colitis.  Her second GI problem is nausea which started about 4 weeks ago few days after she had left partial corneal transplant. Nausea is associated with excessive flatulence and burping. Differential diagnosis would include small intestinal bacterial overgrowth or symptom complex secondary to topical ophthalmic medications.   Recommendations; Stool studies as planned. Consider by mouth antibiotic S&S stool sample obtained for studies   LOS: 0 days   REHMAN,NAJEEB U  02/08/2016, 1:35 PM

## 2016-02-08 NOTE — Progress Notes (Signed)
Subjective: She was admitted with intractable nausea some vomiting and diarrhea. She had acute kidney injury from all of this. She has been nauseated since she had surgery on her cornea about 6 weeks ago. She has attributed this to her eyedrops. She had a similar episode when she had her other eye operated on about a year ago  Objective: Vital signs in last 24 hours: Temp:  [98.1 F (36.7 C)-98.9 F (37.2 C)] 98.9 F (37.2 C) (04/18 0500) Pulse Rate:  [79-107] 87 (04/18 0500) Resp:  [16-22] 20 (04/18 0500) BP: (127-176)/(59-96) 140/60 mmHg (04/18 0500) SpO2:  [92 %-100 %] 92 % (04/18 0500) Weight:  [61.553 kg (135 lb 11.2 oz)-61.689 kg (136 lb)] 61.553 kg (135 lb 11.2 oz) (04/18 0153) Weight change:  Last BM Date: 02/07/16  Intake/Output from previous day: 04/17 0701 - 04/18 0700 In: 445 [I.V.:445] Out: 700 [Urine:700]  PHYSICAL EXAM General appearance: alert, cooperative and mild distress Resp: clear to auscultation bilaterally Cardio: regular rate and rhythm, S1, S2 normal, no murmur, click, rub or gallop GI: Mildly diffusely tender and bloated Extremities: extremities normal, atraumatic, no cyanosis or edema  Lab Results:  Results for orders placed or performed during the hospital encounter of 02/07/16 (from the past 48 hour(s))  Lipase, blood     Status: None   Collection Time: 02/07/16  5:10 PM  Result Value Ref Range   Lipase 38 11 - 51 U/L  Comprehensive metabolic panel     Status: Abnormal   Collection Time: 02/07/16  5:10 PM  Result Value Ref Range   Sodium 128 (L) 135 - 145 mmol/L   Potassium 3.9 3.5 - 5.1 mmol/L   Chloride 89 (L) 101 - 111 mmol/L   CO2 28 22 - 32 mmol/L   Glucose, Bld 117 (H) 65 - 99 mg/dL   BUN 36 (H) 6 - 20 mg/dL   Creatinine, Ser 2.08 (H) 0.44 - 1.00 mg/dL   Calcium 8.2 (L) 8.9 - 10.3 mg/dL   Total Protein 7.5 6.5 - 8.1 g/dL   Albumin 3.4 (L) 3.5 - 5.0 g/dL   AST 24 15 - 41 U/L   ALT 13 (L) 14 - 54 U/L   Alkaline Phosphatase 61 38 -  126 U/L   Total Bilirubin 0.5 0.3 - 1.2 mg/dL   GFR calc non Af Amer 22 (L) >60 mL/min   GFR calc Af Amer 25 (L) >60 mL/min    Comment: (NOTE) The eGFR has been calculated using the CKD EPI equation. This calculation has not been validated in all clinical situations. eGFR's persistently <60 mL/min signify possible Chronic Kidney Disease.    Anion gap 11 5 - 15  CBC     Status: None   Collection Time: 02/07/16  5:10 PM  Result Value Ref Range   WBC 8.9 4.0 - 10.5 K/uL   RBC 4.20 3.87 - 5.11 MIL/uL   Hemoglobin 13.0 12.0 - 15.0 g/dL   HCT 37.7 36.0 - 46.0 %   MCV 89.8 78.0 - 100.0 fL   MCH 31.0 26.0 - 34.0 pg   MCHC 34.5 30.0 - 36.0 g/dL   RDW 13.1 11.5 - 15.5 %   Platelets 203 150 - 400 K/uL  Urinalysis, Routine w reflex microscopic (not at [facility])     Status: Abnormal   Collection Time: 02/07/16 10:32 PM  Result Value Ref Range   Color, Urine YELLOW YELLOW   APPearance CLEAR CLEAR   Specific Gravity, Urine 1.010 1.005 - 1.030  pH 5.5 5.0 - 8.0   Glucose, UA NEGATIVE NEGATIVE mg/dL   Hgb urine dipstick TRACE (A) NEGATIVE   Bilirubin Urine NEGATIVE NEGATIVE   Ketones, ur NEGATIVE NEGATIVE mg/dL   Protein, ur TRACE (A) NEGATIVE mg/dL   Nitrite NEGATIVE NEGATIVE   Leukocytes, UA TRACE (A) NEGATIVE  Urine microscopic-add on     Status: Abnormal   Collection Time: 02/07/16 10:32 PM  Result Value Ref Range   Squamous Epithelial / LPF 0-5 (A) NONE SEEN   WBC, UA 6-30 0 - 5 WBC/hpf   RBC / HPF 0-5 0 - 5 RBC/hpf   Bacteria, UA FEW (A) NONE SEEN   Casts GRANULAR CAST (A) NEGATIVE  Basic metabolic panel     Status: Abnormal   Collection Time: 02/08/16  4:16 AM  Result Value Ref Range   Sodium 132 (L) 135 - 145 mmol/L   Potassium 3.7 3.5 - 5.1 mmol/L   Chloride 99 (L) 101 - 111 mmol/L   CO2 25 22 - 32 mmol/L   Glucose, Bld 121 (H) 65 - 99 mg/dL   BUN 27 (H) 6 - 20 mg/dL   Creatinine, Ser 1.65 (H) 0.44 - 1.00 mg/dL   Calcium 7.5 (L) 8.9 - 10.3 mg/dL   GFR calc non Af Amer 29  (L) >60 mL/min   GFR calc Af Amer 33 (L) >60 mL/min    Comment: (NOTE) The eGFR has been calculated using the CKD EPI equation. This calculation has not been validated in all clinical situations. eGFR's persistently <60 mL/min signify possible Chronic Kidney Disease.    Anion gap 8 5 - 15  CBC     Status: Abnormal   Collection Time: 02/08/16  4:16 AM  Result Value Ref Range   WBC 6.2 4.0 - 10.5 K/uL   RBC 3.61 (L) 3.87 - 5.11 MIL/uL   Hemoglobin 11.2 (L) 12.0 - 15.0 g/dL   HCT 32.5 (L) 36.0 - 46.0 %   MCV 90.0 78.0 - 100.0 fL   MCH 31.0 26.0 - 34.0 pg   MCHC 34.5 30.0 - 36.0 g/dL   RDW 13.1 11.5 - 15.5 %   Platelets 165 150 - 400 K/uL    ABGS No results for input(s): PHART, PO2ART, TCO2, HCO3 in the last 72 hours.  Invalid input(s): PCO2 CULTURES No results found for this or any previous visit (from the past 240 hour(s)). Studies/Results: No results found.  Medications:  Prior to Admission:  Prescriptions prior to admission  Medication Sig Dispense Refill Last Dose  . acetaminophen (TYLENOL) 500 MG tablet Take 1,000 mg by mouth every 6 (six) hours as needed for mild pain.   Past Month at Unknown time  . albuterol (PROVENTIL HFA;VENTOLIN HFA) 108 (90 BASE) MCG/ACT inhaler Inhale 2 puffs into the lungs every 6 (six) hours as needed for wheezing or shortness of breath.   unknown at Unknown time  . albuterol (PROVENTIL) (2.5 MG/3ML) 0.083% nebulizer solution Take 2.5 mg by nebulization daily as needed for wheezing or shortness of breath.    unknown  . aliskiren (TEKTURNA) 150 MG tablet Take 150 mg by mouth 2 (two) times daily.    02/07/2016 at 830a  . ALPRAZolam (XANAX) 0.25 MG tablet Take 0.25 mg by mouth daily as needed for anxiety.    Past Month at Unknown time  . Calcium-Vitamin D-Vitamin K (CALCIUM SOFT CHEWS PO) Take 1,000 mg by mouth.   02/07/2016 at Unknown time  . cholecalciferol (VITAMIN D) 1000 UNITS tablet Take 1,000  Units by mouth daily.    02/07/2016 at Unknown time   . Cyanocobalamin (VITAMIN B 12 PO) Take 2,000 mcg by mouth daily.   02/07/2016 at Unknown time  . diltiazem (CARDIZEM CD) 180 MG 24 hr capsule Take 180 mg by mouth 2 (two) times daily.    02/07/2016 at 830a  . erythromycin ophthalmic ointment Apply 1 application to eye 2 (two) times daily. 14 day course starting on 01/26/2016   02/07/2016 at Unknown time  . ezetimibe (ZETIA) 10 MG tablet Take 10 mg by mouth daily.   02/07/2016 at Unknown time  . fish oil-omega-3 fatty acids 1000 MG capsule Take 1 g by mouth 2 (two) times daily.    02/07/2016 at Unknown time  . Fluticasone-Salmeterol (ADVAIR) 250-50 MCG/DOSE AEPB Inhale 1 puff into the lungs every 12 (twelve) hours.    02/07/2016 at Unknown time  . folic acid (FOLVITE) 283 MCG tablet Take 400 mcg by mouth daily.   02/07/2016 at Unknown time  . guanFACINE (TENEX) 1 MG tablet Take 1 mg by mouth at bedtime.     02/07/2016 at Unknown time  . hydrochlorothiazide (MICROZIDE) 12.5 MG capsule Take 12.5 mg by mouth daily.   Past Week at Unknown time  . multivitamin-iron-minerals-folic acid (CENTRUM) chewable tablet Chew 1 tablet by mouth daily.   02/07/2016 at Unknown time  . omeprazole (PRILOSEC) 40 MG capsule Take 40 mg by mouth daily.   02/07/2016 at Unknown time  . ondansetron (ZOFRAN ODT) 4 MG disintegrating tablet Take 1 tablet (4 mg total) by mouth every 8 (eight) hours as needed for nausea. 20 tablet 0 unknown  . potassium chloride (K-DUR) 10 MEQ tablet Take 10 mEq by mouth at bedtime.   02/06/2016 at Unknown time  . potassium chloride (KLOR-CON) 10 MEQ CR tablet Take 10 mEq by mouth daily.     02/07/2016 at Unknown time  . prednisoLONE acetate (PRED FORTE) 1 % ophthalmic suspension Place 1-2 drops into both eyes every hour while awake. 1 drop-Right eye, 2 drops-Left eye   02/07/2016 at Unknown time  . sodium chloride (MURO 128) 2 % ophthalmic solution Place 2 drops into both eyes 4 (four) times daily.    02/07/2016 at Unknown time   Scheduled: . sodium chloride    Intravenous STAT  . sodium chloride   Intravenous STAT  . [START ON 02/09/2016] aliskiren  300 mg Oral Daily  . [START ON 02/09/2016] diltiazem  360 mg Oral Daily  . fluticasone  2 spray Each Nare Daily  . fluticasone furoate-vilanterol  1 puff Inhalation Daily  . folic acid  662 mcg Oral Daily  . guanFACINE  1 mg Oral QHS  . pantoprazole (PROTONIX) IV  40 mg Intravenous Q24H  . potassium chloride  10 mEq Oral QHS  . [START ON 02/09/2016] prednisoLONE acetate  1 drop Both Eyes Daily   Continuous:  [id number], albuterol, ALPRAZolam, ondansetron **OR** ondansetron (ZOFRAN) IV, ondansetron, sodium chloride  Assesment: She was admitted with nausea vomiting and diarrhea and acute renal failure from that. She is dehydrated. We've been having trouble getting her blood pressure controlled as an outpatient. I suggested her medications to the way she is taking them at home. She is still having significant nausea and still having some diarrhea Principal Problem:   Acute renal failure (ARF) (HCC) Active Problems:   GERD (gastroesophageal reflux disease)   HTN (hypertension)   Nausea, vomiting and diarrhea   Intractable nausea and vomiting    Plan: Continue with IV  Zofran as needed. Continue with her IV fluids as needed. Continue other treatments. She will have GI consultation.    LOS: 0 days   Najmah Carradine L 02/08/2016, 9:00 AM

## 2016-02-08 NOTE — ED Notes (Signed)
Report given to Jessica RN on 300. 

## 2016-02-08 NOTE — ED Notes (Signed)
Patient placed on 5 lead to monitor heart rate.

## 2016-02-09 LAB — GASTROINTESTINAL PANEL BY PCR, STOOL (REPLACES STOOL CULTURE)
Adenovirus F40/41: NOT DETECTED
Astrovirus: NOT DETECTED
CYCLOSPORA CAYETANENSIS: NOT DETECTED
Campylobacter species: NOT DETECTED
Cryptosporidium: NOT DETECTED
E. COLI O157: NOT DETECTED
ENTAMOEBA HISTOLYTICA: NOT DETECTED
ENTEROAGGREGATIVE E COLI (EAEC): NOT DETECTED
Enteropathogenic E coli (EPEC): NOT DETECTED
Enterotoxigenic E coli (ETEC): NOT DETECTED
GIARDIA LAMBLIA: NOT DETECTED
Norovirus GI/GII: NOT DETECTED
Plesimonas shigelloides: NOT DETECTED
Rotavirus A: NOT DETECTED
SALMONELLA SPECIES: DETECTED — AB
SHIGELLA/ENTEROINVASIVE E COLI (EIEC): NOT DETECTED
Sapovirus (I, II, IV, and V): NOT DETECTED
Shiga like toxin producing E coli (STEC): NOT DETECTED
VIBRIO CHOLERAE: NOT DETECTED
VIBRIO SPECIES: NOT DETECTED
Yersinia enterocolitica: NOT DETECTED

## 2016-02-09 LAB — BASIC METABOLIC PANEL
Anion gap: 5 (ref 5–15)
BUN: 17 mg/dL (ref 6–20)
CHLORIDE: 102 mmol/L (ref 101–111)
CO2: 25 mmol/L (ref 22–32)
Calcium: 7.8 mg/dL — ABNORMAL LOW (ref 8.9–10.3)
Creatinine, Ser: 1.22 mg/dL — ABNORMAL HIGH (ref 0.44–1.00)
GFR calc Af Amer: 48 mL/min — ABNORMAL LOW (ref >60)
GFR calc non Af Amer: 42 mL/min — ABNORMAL LOW (ref >60)
Glucose, Bld: 108 mg/dL — ABNORMAL HIGH (ref 65–99)
POTASSIUM: 4.1 mmol/L (ref 3.5–5.1)
SODIUM: 132 mmol/L — AB (ref 135–145)

## 2016-02-09 MED ORDER — LOPERAMIDE HCL 2 MG PO CAPS
2.0000 mg | ORAL_CAPSULE | Freq: Two times a day (BID) | ORAL | Status: AC
  Administered 2016-02-09 – 2016-02-10 (×4): 2 mg via ORAL
  Filled 2016-02-09 (×2): qty 1

## 2016-02-09 MED ORDER — CIPROFLOXACIN IN D5W 400 MG/200ML IV SOLN
400.0000 mg | Freq: Two times a day (BID) | INTRAVENOUS | Status: DC
  Administered 2016-02-09 – 2016-02-11 (×6): 400 mg via INTRAVENOUS
  Filled 2016-02-09 (×7): qty 200

## 2016-02-09 MED ORDER — SODIUM CHLORIDE 0.9 % IV SOLN
INTRAVENOUS | Status: DC
  Administered 2016-02-09: 09:00:00 via INTRAVENOUS

## 2016-02-09 MED ORDER — LOPERAMIDE HCL 2 MG PO CAPS
2.0000 mg | ORAL_CAPSULE | Freq: Once | ORAL | Status: AC
Start: 2016-02-09 — End: 2016-02-09
  Administered 2016-02-09: 2 mg via ORAL
  Filled 2016-02-09: qty 1

## 2016-02-09 MED ORDER — METRONIDAZOLE IN NACL 5-0.79 MG/ML-% IV SOLN
500.0000 mg | Freq: Three times a day (TID) | INTRAVENOUS | Status: DC
  Administered 2016-02-09 – 2016-02-12 (×9): 500 mg via INTRAVENOUS
  Filled 2016-02-09 (×9): qty 100

## 2016-02-09 MED ORDER — SODIUM CHLORIDE 0.9 % IV SOLN
8.0000 mg | Freq: Four times a day (QID) | INTRAVENOUS | Status: DC
  Administered 2016-02-09 – 2016-02-12 (×11): 8 mg via INTRAVENOUS
  Filled 2016-02-09 (×22): qty 4

## 2016-02-09 NOTE — Progress Notes (Signed)
Subjective: She still has diarrhea. I have discussed her situation with Dr.Rehman and we don't think she has Clostridium difficile but may have bacterial overgrowth of a different type. She still has nausea.  Objective: Vital signs in last 24 hours: Temp:  [98.7 F (37.1 C)-99.1 F (37.3 C)] 98.7 F (37.1 C) (04/19 0648) Pulse Rate:  [77-88] 88 (04/19 0648) Resp:  [20] 20 (04/19 0648) BP: (129-144)/(60-97) 144/68 mmHg (04/19 0648) SpO2:  [94 %-96 %] 94 % (04/19 0648) Weight:  [62.642 kg (138 lb 1.6 oz)] 62.642 kg (138 lb 1.6 oz) (04/19 0648) Weight change: 0.953 kg (2 lb 1.6 oz) Last BM Date: 02/07/16  Intake/Output from previous day: 04/18 0701 - 04/19 0700 In: 800 [I.V.:800] Out: -   PHYSICAL EXAM General appearance: alert, cooperative and mild distress Resp: clear to auscultation bilaterally Cardio: regular rate and rhythm, S1, S2 normal, no murmur, click, rub or gallop GI: Mildly diffusely tender Extremities: extremities normal, atraumatic, no cyanosis or edema  Lab Results:  Results for orders placed or performed during the hospital encounter of 02/07/16 (from the past 48 hour(s))  Lipase, blood     Status: None   Collection Time: 02/07/16  5:10 PM  Result Value Ref Range   Lipase 38 11 - 51 U/L  Comprehensive metabolic panel     Status: Abnormal   Collection Time: 02/07/16  5:10 PM  Result Value Ref Range   Sodium 128 (L) 135 - 145 mmol/L   Potassium 3.9 3.5 - 5.1 mmol/L   Chloride 89 (L) 101 - 111 mmol/L   CO2 28 22 - 32 mmol/L   Glucose, Bld 117 (H) 65 - 99 mg/dL   BUN 36 (H) 6 - 20 mg/dL   Creatinine, Ser 2.08 (H) 0.44 - 1.00 mg/dL   Calcium 8.2 (L) 8.9 - 10.3 mg/dL   Total Protein 7.5 6.5 - 8.1 g/dL   Albumin 3.4 (L) 3.5 - 5.0 g/dL   AST 24 15 - 41 U/L   ALT 13 (L) 14 - 54 U/L   Alkaline Phosphatase 61 38 - 126 U/L   Total Bilirubin 0.5 0.3 - 1.2 mg/dL   GFR calc non Af Amer 22 (L) >60 mL/min   GFR calc Af Amer 25 (L) >60 mL/min    Comment: (NOTE) The  eGFR has been calculated using the CKD EPI equation. This calculation has not been validated in all clinical situations. eGFR's persistently <60 mL/min signify possible Chronic Kidney Disease.    Anion gap 11 5 - 15  CBC     Status: None   Collection Time: 02/07/16  5:10 PM  Result Value Ref Range   WBC 8.9 4.0 - 10.5 K/uL   RBC 4.20 3.87 - 5.11 MIL/uL   Hemoglobin 13.0 12.0 - 15.0 g/dL   HCT 37.7 36.0 - 46.0 %   MCV 89.8 78.0 - 100.0 fL   MCH 31.0 26.0 - 34.0 pg   MCHC 34.5 30.0 - 36.0 g/dL   RDW 13.1 11.5 - 15.5 %   Platelets 203 150 - 400 K/uL  Urinalysis, Routine w reflex microscopic (not at [facility])     Status: Abnormal   Collection Time: 02/07/16 10:32 PM  Result Value Ref Range   Color, Urine YELLOW YELLOW   APPearance CLEAR CLEAR   Specific Gravity, Urine 1.010 1.005 - 1.030   pH 5.5 5.0 - 8.0   Glucose, UA NEGATIVE NEGATIVE mg/dL   Hgb urine dipstick TRACE (A) NEGATIVE   Bilirubin Urine NEGATIVE  NEGATIVE   Ketones, ur NEGATIVE NEGATIVE mg/dL   Protein, ur TRACE (A) NEGATIVE mg/dL   Nitrite NEGATIVE NEGATIVE   Leukocytes, UA TRACE (A) NEGATIVE  Urine microscopic-add on     Status: Abnormal   Collection Time: 02/07/16 10:32 PM  Result Value Ref Range   Squamous Epithelial / LPF 0-5 (A) NONE SEEN   WBC, UA 6-30 0 - 5 WBC/hpf   RBC / HPF 0-5 0 - 5 RBC/hpf   Bacteria, UA FEW (A) NONE SEEN   Casts GRANULAR CAST (A) NEGATIVE  Basic metabolic panel     Status: Abnormal   Collection Time: 02/08/16  4:16 AM  Result Value Ref Range   Sodium 132 (L) 135 - 145 mmol/L   Potassium 3.7 3.5 - 5.1 mmol/L   Chloride 99 (L) 101 - 111 mmol/L   CO2 25 22 - 32 mmol/L   Glucose, Bld 121 (H) 65 - 99 mg/dL   BUN 27 (H) 6 - 20 mg/dL   Creatinine, Ser 1.65 (H) 0.44 - 1.00 mg/dL   Calcium 7.5 (L) 8.9 - 10.3 mg/dL   GFR calc non Af Amer 29 (L) >60 mL/min   GFR calc Af Amer 33 (L) >60 mL/min    Comment: (NOTE) The eGFR has been calculated using the CKD EPI equation. This calculation has  not been validated in all clinical situations. eGFR's persistently <60 mL/min signify possible Chronic Kidney Disease.    Anion gap 8 5 - 15  CBC     Status: Abnormal   Collection Time: 02/08/16  4:16 AM  Result Value Ref Range   WBC 6.2 4.0 - 10.5 K/uL   RBC 3.61 (L) 3.87 - 5.11 MIL/uL   Hemoglobin 11.2 (L) 12.0 - 15.0 g/dL   HCT 32.5 (L) 36.0 - 46.0 %   MCV 90.0 78.0 - 100.0 fL   MCH 31.0 26.0 - 34.0 pg   MCHC 34.5 30.0 - 36.0 g/dL   RDW 13.1 11.5 - 15.5 %   Platelets 165 150 - 400 K/uL  C difficile quick scan w PCR reflex     Status: Abnormal   Collection Time: 02/08/16  7:20 PM  Result Value Ref Range   C Diff antigen POSITIVE (A) NEGATIVE   C Diff toxin NEGATIVE NEGATIVE   C Diff interpretation      C. difficile present, but toxin not detected. This indicates colonization. In most cases, this does not require treatment. If patient has signs and symptoms consistent with colitis, consider treatment. Requires ENTERIC precautions.  Basic metabolic panel     Status: Abnormal   Collection Time: 02/09/16  6:02 AM  Result Value Ref Range   Sodium 132 (L) 135 - 145 mmol/L   Potassium 4.1 3.5 - 5.1 mmol/L   Chloride 102 101 - 111 mmol/L   CO2 25 22 - 32 mmol/L   Glucose, Bld 108 (H) 65 - 99 mg/dL   BUN 17 6 - 20 mg/dL   Creatinine, Ser 1.22 (H) 0.44 - 1.00 mg/dL   Calcium 7.8 (L) 8.9 - 10.3 mg/dL   GFR calc non Af Amer 42 (L) >60 mL/min   GFR calc Af Amer 48 (L) >60 mL/min    Comment: (NOTE) The eGFR has been calculated using the CKD EPI equation. This calculation has not been validated in all clinical situations. eGFR's persistently <60 mL/min signify possible Chronic Kidney Disease.    Anion gap 5 5 - 15    ABGS No results for input(s): PHART,  PO2ART, TCO2, HCO3 in the last 72 hours.  Invalid input(s): PCO2 CULTURES Recent Results (from the past 240 hour(s))  C difficile quick scan w PCR reflex     Status: Abnormal   Collection Time: 02/08/16  7:20 PM  Result Value  Ref Range Status   C Diff antigen POSITIVE (A) NEGATIVE Final   C Diff toxin NEGATIVE NEGATIVE Final   C Diff interpretation   Final    C. difficile present, but toxin not detected. This indicates colonization. In most cases, this does not require treatment. If patient has signs and symptoms consistent with colitis, consider treatment. Requires ENTERIC precautions.   Studies/Results: No results found.  Medications:  Prior to Admission:  Prescriptions prior to admission  Medication Sig Dispense Refill Last Dose  . acetaminophen (TYLENOL) 500 MG tablet Take 1,000 mg by mouth every 6 (six) hours as needed for mild pain.   Past Month at Unknown time  . albuterol (PROVENTIL HFA;VENTOLIN HFA) 108 (90 BASE) MCG/ACT inhaler Inhale 2 puffs into the lungs every 6 (six) hours as needed for wheezing or shortness of breath.   unknown at Unknown time  . albuterol (PROVENTIL) (2.5 MG/3ML) 0.083% nebulizer solution Take 2.5 mg by nebulization daily as needed for wheezing or shortness of breath.    unknown  . aliskiren (TEKTURNA) 150 MG tablet Take 150 mg by mouth 2 (two) times daily.    02/07/2016 at 830a  . ALPRAZolam (XANAX) 0.25 MG tablet Take 0.25 mg by mouth daily as needed for anxiety.    Past Month at Unknown time  . Calcium-Vitamin D-Vitamin K (CALCIUM SOFT CHEWS PO) Take 1,000 mg by mouth.   02/07/2016 at Unknown time  . cholecalciferol (VITAMIN D) 1000 UNITS tablet Take 1,000 Units by mouth daily.    02/07/2016 at Unknown time  . Cyanocobalamin (VITAMIN B 12 PO) Take 2,000 mcg by mouth daily.   02/07/2016 at Unknown time  . diltiazem (CARDIZEM CD) 180 MG 24 hr capsule Take 180 mg by mouth 2 (two) times daily.    02/07/2016 at 830a  . erythromycin ophthalmic ointment Apply 1 application to eye 2 (two) times daily. 14 day course starting on 01/26/2016   02/07/2016 at Unknown time  . ezetimibe (ZETIA) 10 MG tablet Take 10 mg by mouth daily.   02/07/2016 at Unknown time  . fish oil-omega-3 fatty acids 1000 MG  capsule Take 1 g by mouth 2 (two) times daily.    02/07/2016 at Unknown time  . Fluticasone-Salmeterol (ADVAIR) 250-50 MCG/DOSE AEPB Inhale 1 puff into the lungs every 12 (twelve) hours.    02/07/2016 at Unknown time  . folic acid (FOLVITE) 286 MCG tablet Take 400 mcg by mouth daily.   02/07/2016 at Unknown time  . guanFACINE (TENEX) 1 MG tablet Take 1 mg by mouth at bedtime.     02/07/2016 at Unknown time  . hydrochlorothiazide (MICROZIDE) 12.5 MG capsule Take 12.5 mg by mouth daily.   Past Week at Unknown time  . multivitamin-iron-minerals-folic acid (CENTRUM) chewable tablet Chew 1 tablet by mouth daily.   02/07/2016 at Unknown time  . omeprazole (PRILOSEC) 40 MG capsule Take 40 mg by mouth daily.   02/07/2016 at Unknown time  . ondansetron (ZOFRAN ODT) 4 MG disintegrating tablet Take 1 tablet (4 mg total) by mouth every 8 (eight) hours as needed for nausea. 20 tablet 0 unknown  . potassium chloride (K-DUR) 10 MEQ tablet Take 10 mEq by mouth at bedtime.   02/06/2016 at Unknown time  .  potassium chloride (KLOR-CON) 10 MEQ CR tablet Take 10 mEq by mouth daily.     02/07/2016 at Unknown time  . prednisoLONE acetate (PRED FORTE) 1 % ophthalmic suspension Place 1-2 drops into both eyes every hour while awake. 1 drop-Right eye, 2 drops-Left eye   02/07/2016 at Unknown time  . sodium chloride (MURO 128) 2 % ophthalmic solution Place 2 drops into both eyes 4 (four) times daily.    02/07/2016 at Unknown time   Scheduled: . aliskiren  300 mg Oral Daily  . ciprofloxacin  400 mg Intravenous Q12H  . diltiazem  360 mg Oral Daily  . fluticasone  2 spray Each Nare Daily  . fluticasone furoate-vilanterol  1 puff Inhalation Daily  . folic acid  386 mcg Oral Daily  . guanFACINE  1 mg Oral QHS  . metronidazole  500 mg Intravenous Q8H  . ondansetron (ZOFRAN) IV  8 mg Intravenous Q6H  . pantoprazole (PROTONIX) IV  40 mg Intravenous Q24H  . potassium chloride  10 mEq Oral QHS  . prednisoLONE acetate  1 drop Both Eyes  Daily   Continuous: . sodium chloride 100 mL/hr at 02/09/16 0908   UHK:[id number], albuterol, ALPRAZolam, ondansetron **OR** ondansetron (ZOFRAN) IV, ondansetron, sodium chloride  Assesment: She was admitted with intractable nausea and vomiting and with diarrhea. She had acute kidney injury which has improved. She's been nauseated for several weeks. She may have bacterial overgrowth that is not C. difficile. Principal Problem:   Acute renal failure (ARF) (HCC) Active Problems:   GERD (gastroesophageal reflux disease)   HTN (hypertension)   Nausea, vomiting and diarrhea   Intractable nausea and vomiting    Plan: Switch her to scheduled Zofran. Continue other treatments but add Cipro and Flagyl    LOS: 1 day   Errica Dutil L 02/09/2016, 9:13 AM

## 2016-02-09 NOTE — Progress Notes (Signed)
Pt has a temp of 101.3, prn 650mg  tylenol administered. Will reassess temp.

## 2016-02-09 NOTE — Progress Notes (Signed)
  Subjective:  Patient does not feel well today. She's had 3 loose watery stools. For stool was large. She complains of bloating but denies abdominal pain or rectal bleeding. She remains with poor appetite and nausea but no vomiting.   Objective: Blood pressure 144/68, pulse 88, temperature 98.7 F (37.1 C), temperature source Oral, resp. rate 20, height 5\' 3"  (1.6 m), weight 138 lb 1.6 oz (62.642 kg), SpO2 94 %. Patient is alert and in no acute distress but she appears to be upset. Abdomenis symmetrical. Bowel sounds are hyperactive. On palpation it is soft and nontender without organomegaly or masses. No LE edema or clubbing noted.  Labs/studies Results:   Recent Labs  02/07/16 1710 02/08/16 0416  WBC 8.9 6.2  HGB 13.0 11.2*  HCT 37.7 32.5*  PLT 203 165    BMET   Recent Labs  02/07/16 1710 02/08/16 0416 02/09/16 0602  NA 128* 132* 132*  K 3.9 3.7 4.1  CL 89* 99* 102  CO2 28 25 25   GLUCOSE 117* 121* 108*  BUN 36* 27* 17  CREATININE 2.08* 1.65* 1.22*  CALCIUM 8.2* 7.5* 7.8*    LFT   Recent Labs  02/07/16 1710  PROT 7.5  ALBUMIN 3.4*  AST 24  ALT 13*  ALKPHOS 61  BILITOT 0.5     Assessment:  #1.Acute diarrhea.GI pathogen panel is pending. Stool C. Difficile positive for antigen but negative for toxin. Suspect acute diarrhea secondary to GI infection. As discussed with Dr. Luan Pulling patient begun on Cipro and metronidazole. #2. Intractable nausea and worse with acute illness. She may have small intestinal bacterial overgrowth. #3. AKI. Renal function is improving with hydration. #4. Mild hyponatremia secondary to GI losses and should improve with IV fluids.  Recommendations:  Loperamide 2 mg by mouth twice a day 4 doses. Yogurt with each meal. Await results of GI pathogen panel.

## 2016-02-09 NOTE — Clinical Documentation Improvement (Signed)
Internal Medicine Please update your documentation within the medical record to reflect your response to this query. Thank you  Can the diagnosis of "Renal insufficiency" be further specified?   CKD Stage I - GFR greater than or equal to 90  CKD Stage II - GFR 60-89  CKD Stage III - GFR 30-59  CKD Stage IV - GFR 15-29  CKD Stage V - GFR < 15  ESRD (End Stage Renal Disease)  Other condition  Unable to clinically determine  Supporting Information: : (risk factors, signs and symptoms, diagnostics, treatment) 02/07/16 ED note.Marland KitchenMarland Kitchen"Renal insufficiency"... 02/08/16 progr note.Marland KitchenMarland Kitchen"Continue with her IV fluids as needed. Continue other treatments..."  Daily labs Results for ARSHI, DUARTE (MRN [id number]) as of 02/09/2016 10:57  02/07/2016 17:10 02/08/2016 04:16 02/09/2016 06:02  Creatinine 2.08 (H) 1.65 (H) 1.22 (H)  EGFR (Non-African Amer.) 22 (L) 29 (L) 42 (L)   Please exercise your independent, professional judgment when responding. A specific answer is not anticipated or expected.  Thank You, Ermelinda Das, RN, BSN, [place] Certified Clinical Documentation Specialist [place]: Health Information Management [phone]

## 2016-02-10 DIAGNOSIS — F411 Generalized anxiety disorder: Secondary | ICD-10-CM | POA: Diagnosis present

## 2016-02-10 DIAGNOSIS — N183 Chronic kidney disease, stage 3 unspecified: Secondary | ICD-10-CM | POA: Diagnosis present

## 2016-02-10 LAB — BASIC METABOLIC PANEL
ANION GAP: 8 (ref 5–15)
BUN: 11 mg/dL (ref 6–20)
CALCIUM: 7.9 mg/dL — AB (ref 8.9–10.3)
CO2: 24 mmol/L (ref 22–32)
Chloride: 104 mmol/L (ref 101–111)
Creatinine, Ser: 1.3 mg/dL — ABNORMAL HIGH (ref 0.44–1.00)
GFR, EST AFRICAN AMERICAN: 45 mL/min — AB (ref >60)
GFR, EST NON AFRICAN AMERICAN: 39 mL/min — AB (ref >60)
Glucose, Bld: 97 mg/dL (ref 65–99)
POTASSIUM: 3.1 mmol/L — AB (ref 3.5–5.1)
Sodium: 136 mmol/L (ref 135–145)

## 2016-02-10 MED ORDER — PANTOPRAZOLE SODIUM 40 MG PO TBEC
40.0000 mg | DELAYED_RELEASE_TABLET | Freq: Every day | ORAL | Status: DC
  Administered 2016-02-11 – 2016-02-12 (×2): 40 mg via ORAL
  Filled 2016-02-10 (×2): qty 1

## 2016-02-10 MED ORDER — POTASSIUM CHLORIDE CRYS ER 20 MEQ PO TBCR
20.0000 meq | EXTENDED_RELEASE_TABLET | Freq: Two times a day (BID) | ORAL | Status: DC
  Administered 2016-02-10 – 2016-02-11 (×4): 20 meq via ORAL
  Filled 2016-02-10 (×5): qty 1

## 2016-02-10 MED ORDER — METOCLOPRAMIDE HCL 5 MG/ML IJ SOLN
5.0000 mg | Freq: Four times a day (QID) | INTRAMUSCULAR | Status: AC
  Administered 2016-02-10 – 2016-02-11 (×4): 5 mg via INTRAVENOUS
  Filled 2016-02-10 (×4): qty 2

## 2016-02-10 NOTE — Progress Notes (Signed)
Subjective: She says she still has nausea. However she feels like she might do better with regular diet then on her current full liquid. She had fever last night and heard GI pathogen panel shows Salmonella which I think would explain her symptoms. Her nausea is better with the round-the-clock Zofran.  Objective: Vital signs in last 24 hours: Temp:  [98.3 F (36.8 C)-101.3 F (38.5 C)] 98.3 F (36.8 C) (04/20 0500) Pulse Rate:  [68-78] 68 (04/20 0500) Resp:  [20] 20 (04/20 0500) BP: (126-127)/(53-64) 127/64 mmHg (04/20 0500) SpO2:  [94 %-96 %] 96 % (04/20 0500) Weight change:  Last BM Date: 02/07/16  Intake/Output from previous day: 04/19 0701 - 04/20 0700 In: 680 [P.O.:680] Out: 600 [Urine:600]  PHYSICAL EXAM General appearance: alert, cooperative and mild distress Resp: clear to auscultation bilaterally Cardio: regular rate and rhythm, S1, S2 normal, no murmur, click, rub or gallop GI: She is still mildly tender Extremities: extremities normal, atraumatic, no cyanosis or edema  Lab Results:  Results for orders placed or performed during the hospital encounter of 02/07/16 (from the past 48 hour(s))  C difficile quick scan w PCR reflex     Status: Abnormal   Collection Time: 02/08/16  7:20 PM  Result Value Ref Range   C Diff antigen POSITIVE (A) NEGATIVE   C Diff toxin NEGATIVE NEGATIVE   C Diff interpretation      C. difficile present, but toxin not detected. This indicates colonization. In most cases, this does not require treatment. If patient has signs and symptoms consistent with colitis, consider treatment. Requires ENTERIC precautions.  Gastrointestinal Panel by PCR , Stool     Status: Abnormal   Collection Time: 02/08/16  7:20 PM  Result Value Ref Range   Campylobacter species NOT DETECTED NOT DETECTED   Plesimonas shigelloides NOT DETECTED NOT DETECTED   Salmonella species DETECTED (A) NOT DETECTED    Comment: CRITICAL RESULT CALLED TO, READ BACK BY AND VERIFIED  WITH: LESLIE DAVIS AT 1750 02/09/2016 BY TFK    Yersinia enterocolitica NOT DETECTED NOT DETECTED   Vibrio species NOT DETECTED NOT DETECTED   Vibrio cholerae NOT DETECTED NOT DETECTED   Enteroaggregative E coli (EAEC) NOT DETECTED NOT DETECTED   Enteropathogenic E coli (EPEC) NOT DETECTED NOT DETECTED   Enterotoxigenic E coli (ETEC) NOT DETECTED NOT DETECTED   Shiga like toxin producing E coli (STEC) NOT DETECTED NOT DETECTED   E. coli O157 NOT DETECTED NOT DETECTED   Shigella/Enteroinvasive E coli (EIEC) NOT DETECTED NOT DETECTED   Cryptosporidium NOT DETECTED NOT DETECTED   Cyclospora cayetanensis NOT DETECTED NOT DETECTED   Entamoeba histolytica NOT DETECTED NOT DETECTED   Giardia lamblia NOT DETECTED NOT DETECTED   Adenovirus F40/41 NOT DETECTED NOT DETECTED   Astrovirus NOT DETECTED NOT DETECTED   Norovirus GI/GII NOT DETECTED NOT DETECTED   Rotavirus A NOT DETECTED NOT DETECTED   Sapovirus (I, II, IV, and V) NOT DETECTED NOT DETECTED  Basic metabolic panel     Status: Abnormal   Collection Time: 02/09/16  6:02 AM  Result Value Ref Range   Sodium 132 (L) 135 - 145 mmol/L   Potassium 4.1 3.5 - 5.1 mmol/L   Chloride 102 101 - 111 mmol/L   CO2 25 22 - 32 mmol/L   Glucose, Bld 108 (H) 65 - 99 mg/dL   BUN 17 6 - 20 mg/dL   Creatinine, Ser 1.22 (H) 0.44 - 1.00 mg/dL   Calcium 7.8 (L) 8.9 - 10.3 mg/dL  GFR calc non Af Amer 42 (L) >60 mL/min   GFR calc Af Amer 48 (L) >60 mL/min    Comment: (NOTE) The eGFR has been calculated using the CKD EPI equation. This calculation has not been validated in all clinical situations. eGFR's persistently <60 mL/min signify possible Chronic Kidney Disease.    Anion gap 5 5 - 15  Basic metabolic panel     Status: Abnormal   Collection Time: 02/10/16  6:42 AM  Result Value Ref Range   Sodium 136 135 - 145 mmol/L   Potassium 3.1 (L) 3.5 - 5.1 mmol/L    Comment: DELTA CHECK NOTED   Chloride 104 101 - 111 mmol/L   CO2 24 22 - 32 mmol/L    Glucose, Bld 97 65 - 99 mg/dL   BUN 11 6 - 20 mg/dL   Creatinine, Ser 1.30 (H) 0.44 - 1.00 mg/dL   Calcium 7.9 (L) 8.9 - 10.3 mg/dL   GFR calc non Af Amer 39 (L) >60 mL/min   GFR calc Af Amer 45 (L) >60 mL/min    Comment: (NOTE) The eGFR has been calculated using the CKD EPI equation. This calculation has not been validated in all clinical situations. eGFR's persistently <60 mL/min signify possible Chronic Kidney Disease.    Anion gap 8 5 - 15    ABGS No results for input(s): PHART, PO2ART, TCO2, HCO3 in the last 72 hours.  Invalid input(s): PCO2 CULTURES Recent Results (from the past 240 hour(s))  C difficile quick scan w PCR reflex     Status: Abnormal   Collection Time: 02/08/16  7:20 PM  Result Value Ref Range Status   C Diff antigen POSITIVE (A) NEGATIVE Final   C Diff toxin NEGATIVE NEGATIVE Final   C Diff interpretation   Final    C. difficile present, but toxin not detected. This indicates colonization. In most cases, this does not require treatment. If patient has signs and symptoms consistent with colitis, consider treatment. Requires ENTERIC precautions.  Gastrointestinal Panel by PCR , Stool     Status: Abnormal   Collection Time: 02/08/16  7:20 PM  Result Value Ref Range Status   Campylobacter species NOT DETECTED NOT DETECTED Final   Plesimonas shigelloides NOT DETECTED NOT DETECTED Final   Salmonella species DETECTED (A) NOT DETECTED Final    Comment: CRITICAL RESULT CALLED TO, READ BACK BY AND VERIFIED WITH: LESLIE DAVIS AT 1750 02/09/2016 BY TFK    Yersinia enterocolitica NOT DETECTED NOT DETECTED Final   Vibrio species NOT DETECTED NOT DETECTED Final   Vibrio cholerae NOT DETECTED NOT DETECTED Final   Enteroaggregative E coli (EAEC) NOT DETECTED NOT DETECTED Final   Enteropathogenic E coli (EPEC) NOT DETECTED NOT DETECTED Final   Enterotoxigenic E coli (ETEC) NOT DETECTED NOT DETECTED Final   Shiga like toxin producing E coli (STEC) NOT DETECTED NOT  DETECTED Final   E. coli O157 NOT DETECTED NOT DETECTED Final   Shigella/Enteroinvasive E coli (EIEC) NOT DETECTED NOT DETECTED Final   Cryptosporidium NOT DETECTED NOT DETECTED Final   Cyclospora cayetanensis NOT DETECTED NOT DETECTED Final   Entamoeba histolytica NOT DETECTED NOT DETECTED Final   Giardia lamblia NOT DETECTED NOT DETECTED Final   Adenovirus F40/41 NOT DETECTED NOT DETECTED Final   Astrovirus NOT DETECTED NOT DETECTED Final   Norovirus GI/GII NOT DETECTED NOT DETECTED Final   Rotavirus A NOT DETECTED NOT DETECTED Final   Sapovirus (I, II, IV, and V) NOT DETECTED NOT DETECTED Final  Studies/Results: No results found.  Medications:  Prior to Admission:  Prescriptions prior to admission  Medication Sig Dispense Refill Last Dose  . acetaminophen (TYLENOL) 500 MG tablet Take 1,000 mg by mouth every 6 (six) hours as needed for mild pain.   Past Month at Unknown time  . albuterol (PROVENTIL HFA;VENTOLIN HFA) 108 (90 BASE) MCG/ACT inhaler Inhale 2 puffs into the lungs every 6 (six) hours as needed for wheezing or shortness of breath.   unknown at Unknown time  . albuterol (PROVENTIL) (2.5 MG/3ML) 0.083% nebulizer solution Take 2.5 mg by nebulization daily as needed for wheezing or shortness of breath.    unknown  . aliskiren (TEKTURNA) 150 MG tablet Take 150 mg by mouth 2 (two) times daily.    02/07/2016 at 830a  . ALPRAZolam (XANAX) 0.25 MG tablet Take 0.25 mg by mouth daily as needed for anxiety.    Past Month at Unknown time  . Calcium-Vitamin D-Vitamin K (CALCIUM SOFT CHEWS PO) Take 1,000 mg by mouth.   02/07/2016 at Unknown time  . cholecalciferol (VITAMIN D) 1000 UNITS tablet Take 1,000 Units by mouth daily.    02/07/2016 at Unknown time  . Cyanocobalamin (VITAMIN B 12 PO) Take 2,000 mcg by mouth daily.   02/07/2016 at Unknown time  . diltiazem (CARDIZEM CD) 180 MG 24 hr capsule Take 180 mg by mouth 2 (two) times daily.    02/07/2016 at 830a  . [EXPIRED] erythromycin  ophthalmic ointment Apply 1 application to eye 2 (two) times daily. 14 day course starting on 01/26/2016   02/07/2016 at Unknown time  . ezetimibe (ZETIA) 10 MG tablet Take 10 mg by mouth daily.   02/07/2016 at Unknown time  . fish oil-omega-3 fatty acids 1000 MG capsule Take 1 g by mouth 2 (two) times daily.    02/07/2016 at Unknown time  . Fluticasone-Salmeterol (ADVAIR) 250-50 MCG/DOSE AEPB Inhale 1 puff into the lungs every 12 (twelve) hours.    02/07/2016 at Unknown time  . folic acid (FOLVITE) 845 MCG tablet Take 400 mcg by mouth daily.   02/07/2016 at Unknown time  . guanFACINE (TENEX) 1 MG tablet Take 1 mg by mouth at bedtime.     02/07/2016 at Unknown time  . hydrochlorothiazide (MICROZIDE) 12.5 MG capsule Take 12.5 mg by mouth daily.   Past Week at Unknown time  . multivitamin-iron-minerals-folic acid (CENTRUM) chewable tablet Chew 1 tablet by mouth daily.   02/07/2016 at Unknown time  . omeprazole (PRILOSEC) 40 MG capsule Take 40 mg by mouth daily.   02/07/2016 at Unknown time  . ondansetron (ZOFRAN ODT) 4 MG disintegrating tablet Take 1 tablet (4 mg total) by mouth every 8 (eight) hours as needed for nausea. 20 tablet 0 unknown  . potassium chloride (K-DUR) 10 MEQ tablet Take 10 mEq by mouth at bedtime.   02/06/2016 at Unknown time  . potassium chloride (KLOR-CON) 10 MEQ CR tablet Take 10 mEq by mouth daily.     02/07/2016 at Unknown time  . prednisoLONE acetate (PRED FORTE) 1 % ophthalmic suspension Place 1-2 drops into both eyes every hour while awake. 1 drop-Right eye, 2 drops-Left eye   02/07/2016 at Unknown time  . sodium chloride (MURO 128) 2 % ophthalmic solution Place 2 drops into both eyes 4 (four) times daily.    02/07/2016 at Unknown time   Scheduled: . aliskiren  300 mg Oral Daily  . ciprofloxacin  400 mg Intravenous Q12H  . diltiazem  360 mg Oral Daily  .  fluticasone  2 spray Each Nare Daily  . fluticasone furoate-vilanterol  1 puff Inhalation Daily  . folic acid  832 mcg Oral Daily   . guanFACINE  1 mg Oral QHS  . loperamide  2 mg Oral BID  . metronidazole  500 mg Intravenous Q8H  . ondansetron (ZOFRAN) IV  8 mg Intravenous Q6H  . pantoprazole (PROTONIX) IV  40 mg Intravenous Q24H  . potassium chloride  20 mEq Oral BID  . prednisoLONE acetate  1 drop Both Eyes Daily   Continuous: . sodium chloride 100 mL/hr at 02/09/16 0908   NVB:[id number], albuterol, ALPRAZolam, ondansetron **OR** ondansetron (ZOFRAN) IV, ondansetron, sodium chloride  Assesment: She was admitted with acute renal failure and probably has some element of chronic renal failure as well. She was dehydrated. She has had nausea vomiting and diarrhea for the last 6 weeks or so. She has salmonella in her stool and I think that may explain her symptoms. She is on appropriate treatment for that. Her symptoms are exacerbated by her severe anxiety. She has hypertension which is pretty well controlled and asthma which does not appear to be an active part of her problem now Principal Problem:   Acute renal failure (ARF) ([place]) Active Problems:   GERD (gastroesophageal reflux disease)   HTN (hypertension)   Nausea, vomiting and diarrhea   Intractable nausea and vomiting   Chronic renal failure, stage 3 (moderate)    Plan: Continue Cipro and Flagyl. No other changes. Advance her diet.    LOS: 2 days   Yazmyne Sara L 02/10/2016, 8:42 AM

## 2016-02-10 NOTE — Consult Note (Signed)
   [facility] Inpatient Consult   02/10/2016  Stephanie West 06-28-1941 [id number]  Patient screened for potential [facility] services. Patient is eligible for [facility]. Chart notes reviewed and there were no identifiable [facility] care management needs at this time. [facility] Care Management services not appropriate at this time.   If patient's post hospital needs change please place a [facility] Care Management consult.   Of note, [facility] Care Management services would not replace or interfere with any services that are arranged by inpatient case management or social work. For additional questions or referrals please contact:   Royetta Crochet. Laymond Purser, RN, BSN, [facility] Liaison [phone]

## 2016-02-10 NOTE — Progress Notes (Signed)
  Subjective:  Patient continues to complain of intractable nausea. She states she is unable to eat. She denies abdominal pain but remains with diarrhea which is nonbloody.    Objective: Blood pressure 134/62, pulse 76, temperature 98.6 F (37 C), temperature source Oral, resp. rate 18, height 5\' 3"  (1.6 m), weight 138 lb 1.6 oz (62.642 kg), SpO2 95 %. Patient is alert and in no acute distress. Abdomen remains soft and nontender without organomegaly or masses. No LE edema or clubbing noted.  Labs/studies Results:   Recent Labs  02/08/16 0416  WBC 6.2  HGB 11.2*  HCT 32.5*  PLT 165    BMET   Recent Labs  02/08/16 0416 02/09/16 0602 02/10/16 0642  NA 132* 132* 136  K 3.7 4.1 3.1*  CL 99* 102 104  CO2 25 25 24   GLUCOSE 121* 108* 97  BUN 27* 17 11  CREATININE 1.65* 1.22* 1.30*  CALCIUM 7.5* 7.8* 7.9*    GI pathogen panel positive for salmonella species.   Assessment:  #1. Intractable nausea with poor appetite made worse with Salmonella enterocolitis. Nausea started about 4 weeks ago after she started use 3 I medications following corneal transplant. She is not responding to schedule dose of ondansetron. #2. Acute diarrhea secondary to Salmonella enteritis or enterocolitis. Patient was begun on Cipro and metronidazole yesterday before this result was known. Patient does not appear to be toxic. Her with a less than continue with his therapy. #3. AKI. Serum creatinine remains mildly elevated but significantly improved since admission. #4. Hypokalemia. Patient receiving oral KCl.  Recommendations:  Trial with metoclopramide 5 mg IV every 64 doses.

## 2016-02-11 DIAGNOSIS — A020 Salmonella enteritis: Secondary | ICD-10-CM | POA: Diagnosis present

## 2016-02-11 LAB — BASIC METABOLIC PANEL
ANION GAP: 8 (ref 5–15)
BUN: 8 mg/dL (ref 6–20)
CALCIUM: 7.9 mg/dL — AB (ref 8.9–10.3)
CO2: 22 mmol/L (ref 22–32)
Chloride: 108 mmol/L (ref 101–111)
Creatinine, Ser: 1.21 mg/dL — ABNORMAL HIGH (ref 0.44–1.00)
GFR, EST AFRICAN AMERICAN: 49 mL/min — AB (ref >60)
GFR, EST NON AFRICAN AMERICAN: 42 mL/min — AB (ref >60)
Glucose, Bld: 85 mg/dL (ref 65–99)
POTASSIUM: 4 mmol/L (ref 3.5–5.1)
Sodium: 138 mmol/L (ref 135–145)

## 2016-02-11 NOTE — Progress Notes (Signed)
Pt's BP 192/82 at 2116. Tenex was administered at 2049. Bp at 2138 was 184/80. Will reassess pt's bp to ensure effectiveness of Tenex.

## 2016-02-11 NOTE — Progress Notes (Signed)
Subjective: She feels better. She does have some appetite now. She still has a little bit of diarrhea.  Objective: Vital signs in last 24 hours: Temp:  [98.6 F (37 C)-99.1 F (37.3 C)] 98.7 F (37.1 C) (04/21 0538) Pulse Rate:  [74-81] 74 (04/21 0538) Resp:  [18-20] 20 (04/21 0538) BP: (131-142)/(53-62) 131/54 mmHg (04/21 0538) SpO2:  [91 %-98 %] 98 % (04/21 0820) Weight change:  Last BM Date: 02/07/16  Intake/Output from previous day: 04/20 0701 - 04/21 0700 In: 480 [P.O.:480] Out: 1000 [Urine:1000]  PHYSICAL EXAM General appearance: alert, cooperative and mild distress Resp: clear to auscultation bilaterally Cardio: regular rate and rhythm, S1, S2 normal, no murmur, click, rub or gallop GI: Abdominal tenderness is much better Extremities: extremities normal, atraumatic, no cyanosis or edema  Lab Results:  Results for orders placed or performed during the hospital encounter of 02/07/16 (from the past 48 hour(s))  Basic metabolic panel     Status: Abnormal   Collection Time: 02/10/16  6:42 AM  Result Value Ref Range   Sodium 136 135 - 145 mmol/L   Potassium 3.1 (L) 3.5 - 5.1 mmol/L    Comment: DELTA CHECK NOTED   Chloride 104 101 - 111 mmol/L   CO2 24 22 - 32 mmol/L   Glucose, Bld 97 65 - 99 mg/dL   BUN 11 6 - 20 mg/dL   Creatinine, Ser 1.30 (H) 0.44 - 1.00 mg/dL   Calcium 7.9 (L) 8.9 - 10.3 mg/dL   GFR calc non Af Amer 39 (L) >60 mL/min   GFR calc Af Amer 45 (L) >60 mL/min    Comment: (NOTE) The eGFR has been calculated using the CKD EPI equation. This calculation has not been validated in all clinical situations. eGFR's persistently <60 mL/min signify possible Chronic Kidney Disease.    Anion gap 8 5 - 15  Basic metabolic panel     Status: Abnormal   Collection Time: 02/11/16  6:11 AM  Result Value Ref Range   Sodium 138 135 - 145 mmol/L   Potassium 4.0 3.5 - 5.1 mmol/L    Comment: DELTA CHECK NOTED   Chloride 108 101 - 111 mmol/L   CO2 22 22 - 32 mmol/L    Glucose, Bld 85 65 - 99 mg/dL   BUN 8 6 - 20 mg/dL   Creatinine, Ser 1.21 (H) 0.44 - 1.00 mg/dL   Calcium 7.9 (L) 8.9 - 10.3 mg/dL   GFR calc non Af Amer 42 (L) >60 mL/min   GFR calc Af Amer 49 (L) >60 mL/min    Comment: (NOTE) The eGFR has been calculated using the CKD EPI equation. This calculation has not been validated in all clinical situations. eGFR's persistently <60 mL/min signify possible Chronic Kidney Disease.    Anion gap 8 5 - 15    ABGS No results for input(s): PHART, PO2ART, TCO2, HCO3 in the last 72 hours.  Invalid input(s): PCO2 CULTURES Recent Results (from the past 240 hour(s))  C difficile quick scan w PCR reflex     Status: Abnormal   Collection Time: 02/08/16  7:20 PM  Result Value Ref Range Status   C Diff antigen POSITIVE (A) NEGATIVE Final   C Diff toxin NEGATIVE NEGATIVE Final   C Diff interpretation   Final    C. difficile present, but toxin not detected. This indicates colonization. In most cases, this does not require treatment. If patient has signs and symptoms consistent with colitis, consider treatment. Requires ENTERIC precautions.  Gastrointestinal Panel by PCR , Stool     Status: Abnormal   Collection Time: 02/08/16  7:20 PM  Result Value Ref Range Status   Campylobacter species NOT DETECTED NOT DETECTED Final   Plesimonas shigelloides NOT DETECTED NOT DETECTED Final   Salmonella species DETECTED (A) NOT DETECTED Final    Comment: CRITICAL RESULT CALLED TO, READ BACK BY AND VERIFIED WITH: LESLIE DAVIS AT 1750 02/09/2016 BY TFK    Yersinia enterocolitica NOT DETECTED NOT DETECTED Final   Vibrio species NOT DETECTED NOT DETECTED Final   Vibrio cholerae NOT DETECTED NOT DETECTED Final   Enteroaggregative E coli (EAEC) NOT DETECTED NOT DETECTED Final   Enteropathogenic E coli (EPEC) NOT DETECTED NOT DETECTED Final   Enterotoxigenic E coli (ETEC) NOT DETECTED NOT DETECTED Final   Shiga like toxin producing E coli (STEC) NOT DETECTED NOT  DETECTED Final   E. coli O157 NOT DETECTED NOT DETECTED Final   Shigella/Enteroinvasive E coli (EIEC) NOT DETECTED NOT DETECTED Final   Cryptosporidium NOT DETECTED NOT DETECTED Final   Cyclospora cayetanensis NOT DETECTED NOT DETECTED Final   Entamoeba histolytica NOT DETECTED NOT DETECTED Final   Giardia lamblia NOT DETECTED NOT DETECTED Final   Adenovirus F40/41 NOT DETECTED NOT DETECTED Final   Astrovirus NOT DETECTED NOT DETECTED Final   Norovirus GI/GII NOT DETECTED NOT DETECTED Final   Rotavirus A NOT DETECTED NOT DETECTED Final   Sapovirus (I, II, IV, and V) NOT DETECTED NOT DETECTED Final   Studies/Results: No results found.  Medications:  Prior to Admission:  Prescriptions prior to admission  Medication Sig Dispense Refill Last Dose  . acetaminophen (TYLENOL) 500 MG tablet Take 1,000 mg by mouth every 6 (six) hours as needed for mild pain.   Past Month at Unknown time  . albuterol (PROVENTIL HFA;VENTOLIN HFA) 108 (90 BASE) MCG/ACT inhaler Inhale 2 puffs into the lungs every 6 (six) hours as needed for wheezing or shortness of breath.   unknown at Unknown time  . albuterol (PROVENTIL) (2.5 MG/3ML) 0.083% nebulizer solution Take 2.5 mg by nebulization daily as needed for wheezing or shortness of breath.    unknown  . aliskiren (TEKTURNA) 150 MG tablet Take 150 mg by mouth 2 (two) times daily.    02/07/2016 at 830a  . ALPRAZolam (XANAX) 0.25 MG tablet Take 0.25 mg by mouth daily as needed for anxiety.    Past Month at Unknown time  . Calcium-Vitamin D-Vitamin K (CALCIUM SOFT CHEWS PO) Take 1,000 mg by mouth.   02/07/2016 at Unknown time  . cholecalciferol (VITAMIN D) 1000 UNITS tablet Take 1,000 Units by mouth daily.    02/07/2016 at Unknown time  . Cyanocobalamin (VITAMIN B 12 PO) Take 2,000 mcg by mouth daily.   02/07/2016 at Unknown time  . diltiazem (CARDIZEM CD) 180 MG 24 hr capsule Take 180 mg by mouth 2 (two) times daily.    02/07/2016 at 830a  . [EXPIRED] erythromycin  ophthalmic ointment Apply 1 application to eye 2 (two) times daily. 14 day course starting on 01/26/2016   02/07/2016 at Unknown time  . ezetimibe (ZETIA) 10 MG tablet Take 10 mg by mouth daily.   02/07/2016 at Unknown time  . fish oil-omega-3 fatty acids 1000 MG capsule Take 1 g by mouth 2 (two) times daily.    02/07/2016 at Unknown time  . Fluticasone-Salmeterol (ADVAIR) 250-50 MCG/DOSE AEPB Inhale 1 puff into the lungs every 12 (twelve) hours.    02/07/2016 at Unknown time  . folic acid (  FOLVITE) 400 MCG tablet Take 400 mcg by mouth daily.   02/07/2016 at Unknown time  . guanFACINE (TENEX) 1 MG tablet Take 1 mg by mouth at bedtime.     02/07/2016 at Unknown time  . hydrochlorothiazide (MICROZIDE) 12.5 MG capsule Take 12.5 mg by mouth daily.   Past Week at Unknown time  . multivitamin-iron-minerals-folic acid (CENTRUM) chewable tablet Chew 1 tablet by mouth daily.   02/07/2016 at Unknown time  . omeprazole (PRILOSEC) 40 MG capsule Take 40 mg by mouth daily.   02/07/2016 at Unknown time  . ondansetron (ZOFRAN ODT) 4 MG disintegrating tablet Take 1 tablet (4 mg total) by mouth every 8 (eight) hours as needed for nausea. 20 tablet 0 unknown  . potassium chloride (K-DUR) 10 MEQ tablet Take 10 mEq by mouth at bedtime.   02/06/2016 at Unknown time  . potassium chloride (KLOR-CON) 10 MEQ CR tablet Take 10 mEq by mouth daily.     02/07/2016 at Unknown time  . prednisoLONE acetate (PRED FORTE) 1 % ophthalmic suspension Place 1-2 drops into both eyes every hour while awake. 1 drop-Right eye, 2 drops-Left eye   02/07/2016 at Unknown time  . sodium chloride (MURO 128) 2 % ophthalmic solution Place 2 drops into both eyes 4 (four) times daily.    02/07/2016 at Unknown time   Scheduled: . aliskiren  300 mg Oral Daily  . ciprofloxacin  400 mg Intravenous Q12H  . diltiazem  360 mg Oral Daily  . fluticasone  2 spray Each Nare Daily  . fluticasone furoate-vilanterol  1 puff Inhalation Daily  . folic acid  579 mcg Oral Daily   . guanFACINE  1 mg Oral QHS  . metoCLOPramide (REGLAN) injection  5 mg Intravenous Q6H  . metronidazole  500 mg Intravenous Q8H  . ondansetron (ZOFRAN) IV  8 mg Intravenous Q6H  . pantoprazole  40 mg Oral Daily  . potassium chloride  20 mEq Oral BID  . prednisoLONE acetate  1 drop Both Eyes Daily   Continuous: . sodium chloride 100 mL/hr at 02/09/16 0908   [id number], albuterol, ALPRAZolam, ondansetron **OR** ondansetron (ZOFRAN) IV, ondansetron, sodium chloride  Assesment: She has been admitted with intractable nausea vomiting and diarrhea. She has salmonella in her stool. She is being treated for that. She is improving. She had acute renal failure on admission and this is acute on chronic. She was dehydrated and has improved with hydration. She has anxiety which is still a problem but she is getting better. Principal Problem:   Acute renal failure (ARF) ([facility]) Active Problems:   GERD (gastroesophageal reflux disease)   HTN (hypertension)   Nausea, vomiting and diarrhea   Intractable nausea and vomiting   Chronic renal failure, stage 3 (moderate)   Generalized anxiety disorder    Plan: I think she needs one more day of IV and then home tomorrow    LOS: 3 days   Duel Conrad L 02/11/2016, 8:49 AM

## 2016-02-11 NOTE — Care Management Important Message (Signed)
Important Message  Patient Details  Name: Stephanie West MRN: [id number] Date of Birth: 20-May-1941   Medicare Important Message Given:  Yes    Alvie Heidelberg, RN 02/11/2016, 12:34 PM

## 2016-02-11 NOTE — Care Management Note (Signed)
Case Management Note  Patient Details  Name: Stephanie West MRN: [id number] Date of Birth: June 30, 1941  Subjective/Objective:    Spoke with patient for discharge planning patient is from home and independent. Drives self  To appointments. Has no equipment at home .   Denies issues with obtaining medications.                 Action/Plan:Home with self care.  Expected Discharge Date:                  Expected Discharge Plan:  Home/Self Care  In-House Referral:     Discharge planning Services  CM Consult  Post Acute Care Choice:    Choice offered to:     DME Arranged:    DME Agency:     HH Arranged:    [place] Agency:     Status of Service:  Completed, signed off  Medicare Important Message Given:  Yes Date Medicare IM Given:    Medicare IM give by:    Date Additional Medicare IM Given:    Additional Medicare Important Message give by:     If discussed at [place] of Stay Meetings, dates discussed:    Additional Comments:  Alvie Heidelberg, RN 02/11/2016, 3:27 PM

## 2016-02-11 NOTE — Progress Notes (Signed)
  Subjective:  Patient states today is the first that she has felt better. Nausea has at least 70% better and she was able to eat more today. She had one bowel movement earlier today. Stool is firming up but not normal. She did not pass any blood per rectum today. She is not having any side effects with metoclopramide.  Objective: Blood pressure 142/57, pulse 75, temperature 98.4 F (36.9 C), temperature source Oral, resp. rate 20, height 5\' 3"  (1.6 m), weight 138 lb 1.6 oz (62.642 kg), SpO2 97 %. Patient is alert and in no acute distress. Abdomen is symmetrical. Bowel sounds are normal. On palpation abdomen is soft and nontender without organomegaly or masses. No LE edema or clubbing noted. She does not have tremors or abnormal orofacial movements.  Labs/studies Results:  No results for input(s): WBC, HGB, HCT, PLT in the last 72 hours.  BMET   Recent Labs  02/09/16 0602 02/10/16 0642 02/11/16 0611  NA 132* 136 138  K 4.1 3.1* 4.0  CL 102 104 108  CO2 25 24 22   GLUCOSE 108* 97 85  BUN 17 11 8   CREATININE 1.22* 1.30* 1.21*  CALCIUM 7.8* 7.9* 7.9*       Assessment:  #1. Acute diarrhea secondary to Salmonella enterocolitis. Patient is on Cipro and Flagyl. She is on Flagyl possibly for small intestinal bacterial overgrowth. #2. Intractable nausea with inability to eat. Significant symptomatic improvement with low-dose metoclopramide  Recommendations:  Change metoclopramide to oral route at 5 mg by mouth before meals and daily at bedtime. Toprol loperamide can be used on as needed basis when she is discharged. Patient can be switched to oral antibiotics at the time of discharge and should continue for another 4-5 days.

## 2016-02-12 LAB — BASIC METABOLIC PANEL
ANION GAP: 7 (ref 5–15)
BUN: 5 mg/dL — ABNORMAL LOW (ref 6–20)
CALCIUM: 8 mg/dL — AB (ref 8.9–10.3)
CHLORIDE: 107 mmol/L (ref 101–111)
CO2: 21 mmol/L — AB (ref 22–32)
Creatinine, Ser: 1.09 mg/dL — ABNORMAL HIGH (ref 0.44–1.00)
GFR calc non Af Amer: 48 mL/min — ABNORMAL LOW (ref >60)
GFR, EST AFRICAN AMERICAN: 55 mL/min — AB (ref >60)
Glucose, Bld: 91 mg/dL (ref 65–99)
POTASSIUM: 3.9 mmol/L (ref 3.5–5.1)
Sodium: 135 mmol/L (ref 135–145)

## 2016-02-12 MED ORDER — METRONIDAZOLE 500 MG PO TABS: 500.0000 mg | ORAL_TABLET | Freq: Three times a day (TID) | ORAL | Status: DC

## 2016-02-12 MED ORDER — ONDANSETRON 8 MG PO TBDP: 8.0000 mg | ORAL_TABLET | Freq: Four times a day (QID) | ORAL | Status: DC

## 2016-02-12 MED ORDER — CIPROFLOXACIN HCL 500 MG PO TABS: 500.0000 mg | ORAL_TABLET | Freq: Two times a day (BID) | ORAL | Status: DC

## 2016-02-12 MED ORDER — METOCLOPRAMIDE HCL 5 MG/5ML PO SOLN: 5.0000 mg | Freq: Three times a day (TID) | ORAL | Status: DC

## 2016-02-12 NOTE — Progress Notes (Signed)
Discharged PT per MD order and protocol. Discharge handouts reviewed/explained. Education completed.  Pt verbalized understanding and left with all belongings. VSS. IV catheter taken out last night.  Patient wheeled down by staff member.

## 2016-02-12 NOTE — Discharge Summary (Signed)
Physician Discharge Summary  Patient ID: Stephanie West MRN: [id number] DOB/AGE: 31-Oct-1940 75 y.o. Primary Care Physician:Rick Carruthers L, MD Admit date: 02/07/2016 Discharge date: 02/12/2016    Discharge Diagnoses:   Principal Problem:   Acute renal failure (ARF) ([place]) Active Problems:   GERD (gastroesophageal reflux disease)   HTN (hypertension)   Nausea, vomiting and diarrhea   Intractable nausea and vomiting   Chronic renal failure, stage 3 (moderate)   Generalized anxiety disorder   Salmonella gastroenteritis  dehydration Asthma Chronic sinusitis    Medication List    STOP taking these medications        erythromycin ophthalmic ointment      TAKE these medications        acetaminophen 500 MG tablet  Commonly known as:  TYLENOL  Take 1,000 mg by mouth every 6 (six) hours as needed for mild pain.     albuterol 108 (90 Base) MCG/ACT inhaler  Commonly known as:  PROVENTIL HFA;VENTOLIN HFA  Inhale 2 puffs into the lungs every 6 (six) hours as needed for wheezing or shortness of breath.     albuterol (2.5 MG/3ML) 0.083% nebulizer solution  Commonly known as:  PROVENTIL  Take 2.5 mg by nebulization daily as needed for wheezing or shortness of breath.     aliskiren 150 MG tablet  Commonly known as:  TEKTURNA  Take 150 mg by mouth 2 (two) times daily.     ALPRAZolam 0.25 MG tablet  Commonly known as:  XANAX  Take 0.25 mg by mouth daily as needed for anxiety.     CALCIUM SOFT CHEWS PO  Take 1,000 mg by mouth.     cholecalciferol 1000 units tablet  Commonly known as:  VITAMIN D  Take 1,000 Units by mouth daily.     ciprofloxacin 500 MG tablet  Commonly known as:  CIPRO  Take 1 tablet (500 mg total) by mouth 2 (two) times daily.     diltiazem 180 MG 24 hr capsule  Commonly known as:  CARDIZEM CD  Take 180 mg by mouth 2 (two) times daily.     ezetimibe 10 MG tablet  Commonly known as:  ZETIA  Take 10 mg by mouth daily.     fish oil-omega-3 fatty  acids 1000 MG capsule  Take 1 g by mouth 2 (two) times daily.     Fluticasone-Salmeterol 250-50 MCG/DOSE Aepb  Commonly known as:  ADVAIR  Inhale 1 puff into the lungs every 12 (twelve) hours.     folic acid [id number] MCG tablet  Commonly known as:  FOLVITE  Take 400 mcg by mouth daily.     guanFACINE 1 MG tablet  Commonly known as:  TENEX  Take 1 mg by mouth at bedtime.     hydrochlorothiazide 12.5 MG capsule  Commonly known as:  MICROZIDE  Take 12.5 mg by mouth daily.     metoCLOPramide 5 MG/5ML solution  Commonly known as:  REGLAN  Take 5 mLs (5 mg total) by mouth 3 (three) times daily before meals.     metroNIDAZOLE 500 MG tablet  Commonly known as:  FLAGYL  Take 1 tablet (500 mg total) by mouth 3 (three) times daily.     multivitamin-iron-minerals-folic acid chewable tablet  Chew 1 tablet by mouth daily.     omeprazole 40 MG capsule  Commonly known as:  PRILOSEC  Take 40 mg by mouth daily.     ondansetron 8 MG disintegrating tablet  Commonly known as:  ZOFRAN ODT  Take  1 tablet (8 mg total) by mouth every 6 (six) hours.     potassium chloride 10 MEQ tablet  Commonly known as:  K-DUR  Take 10 mEq by mouth at bedtime.     potassium chloride 10 MEQ CR tablet  Commonly known as:  KLOR-CON  Take 10 mEq by mouth daily.     prednisoLONE acetate 1 % ophthalmic suspension  Commonly known as:  PRED FORTE  Place 1-2 drops into both eyes every hour while awake. 1 drop-Right eye, 2 drops-Left eye     promethazine 25 MG tablet  Commonly known as:  PHENERGAN  Take 0.5 tablets (12.5 mg total) by mouth every 8 (eight) hours as needed for refractory nausea / vomiting.     sodium chloride 2 % ophthalmic solution  Commonly known as:  MURO 128  Place 2 drops into both eyes 4 (four) times daily.     VITAMIN B 12 PO  Take 2,000 mcg by mouth daily.        Discharged Condition: Improved    Consults: GI, Dr. Laural Golden  Significant Diagnostic Studies: No results found.  Lab  Results: Basic Metabolic Panel:  Recent Labs  02/11/16 0611 02/12/16 0616  NA 138 135  K 4.0 3.9  CL 108 107  CO2 22 21*  GLUCOSE 85 91  BUN 8 5*  CREATININE 1.21* 1.09*  CALCIUM 7.9* 8.0*   Liver Function Tests: No results for input(s): AST, ALT, ALKPHOS, BILITOT, PROT, ALBUMIN in the last 72 hours.   CBC: No results for input(s): WBC, NEUTROABS, HGB, HCT, MCV, PLT in the last 72 hours.  Recent Results (from the past 240 hour(s))  C difficile quick scan w PCR reflex     Status: Abnormal   Collection Time: 02/08/16  7:20 PM  Result Value Ref Range Status   C Diff antigen POSITIVE (A) NEGATIVE Final   C Diff toxin NEGATIVE NEGATIVE Final   C Diff interpretation   Final    C. difficile present, but toxin not detected. This indicates colonization. In most cases, this does not require treatment. If patient has signs and symptoms consistent with colitis, consider treatment. Requires ENTERIC precautions.  Gastrointestinal Panel by PCR , Stool     Status: Abnormal   Collection Time: 02/08/16  7:20 PM  Result Value Ref Range Status   Campylobacter species NOT DETECTED NOT DETECTED Final   Plesimonas shigelloides NOT DETECTED NOT DETECTED Final   Salmonella species DETECTED (A) NOT DETECTED Final    Comment: CRITICAL RESULT CALLED TO, READ BACK BY AND VERIFIED WITH: LESLIE DAVIS AT 1750 02/09/2016 BY TFK    Yersinia enterocolitica NOT DETECTED NOT DETECTED Final   Vibrio species NOT DETECTED NOT DETECTED Final   Vibrio cholerae NOT DETECTED NOT DETECTED Final   Enteroaggregative E coli (EAEC) NOT DETECTED NOT DETECTED Final   Enteropathogenic E coli (EPEC) NOT DETECTED NOT DETECTED Final   Enterotoxigenic E coli (ETEC) NOT DETECTED NOT DETECTED Final   Shiga like toxin producing E coli (STEC) NOT DETECTED NOT DETECTED Final   E. coli O157 NOT DETECTED NOT DETECTED Final   Shigella/Enteroinvasive E coli (EIEC) NOT DETECTED NOT DETECTED Final   Cryptosporidium NOT DETECTED NOT  DETECTED Final   Cyclospora cayetanensis NOT DETECTED NOT DETECTED Final   Entamoeba histolytica NOT DETECTED NOT DETECTED Final   Giardia lamblia NOT DETECTED NOT DETECTED Final   Adenovirus F40/41 NOT DETECTED NOT DETECTED Final   Astrovirus NOT DETECTED NOT DETECTED Final   Norovirus  GI/GII NOT DETECTED NOT DETECTED Final   Rotavirus A NOT DETECTED NOT DETECTED Final   Sapovirus (I, II, IV, and V) NOT DETECTED NOT DETECTED Final     Hospital Course: This is a 75 year old who's had several weeks of nausea and diarrhea. She had attributed it to the eyedrops that she is taking after corneal transplant. She had a previous corneal transplant last year and had a similar problem. She eventually got bad enough that she was dehydrated and came to the emergency department. She was found to have acute kidney injury probably related to dehydration. She was started on IV fluids and given anti-emetics. Her GI pathogen panel showed Salmonella. She was started on treatment with Cipro and Flagyl and improved markedly. By the time of discharge she was able to eat her nausea was under control and she did not have any diarrhea or renal function had improved  Discharge Exam: Blood pressure 170/86, pulse 104, temperature 98.5 F (36.9 C), temperature source Oral, resp. rate 20, height 5\' 3"  (1.6 m), weight 62.642 kg (138 lb 1.6 oz), SpO2 98 %. She is awake and alert. Her chest is clear. Her abdomen is not tender now  Disposition: Home      Discharge Instructions    Discharge patient    Complete by:  As directed            Follow-up Information    Follow up with Asiyah Pineau L, MD In 3 days.   Specialty:  Pulmonary Disease   Contact information:   [address] [place] [address] [phone]       Signed: Radek Carnero L   02/12/2016, 9:43 AM

## 2016-02-12 NOTE — Progress Notes (Signed)
Subjective: She says she feels much better and wants to go home. Her IV came out last night.  Objective: Vital signs in last 24 hours: Temp:  [97.8 F (36.6 C)-98.5 F (36.9 C)] 98.5 F (36.9 C) (04/22 0650) Pulse Rate:  [75-104] 104 (04/22 0650) Resp:  [20] 20 (04/22 0650) BP: (142-193)/(57-86) 170/86 mmHg (04/22 0650) SpO2:  [95 %-100 %] 98 % (04/22 0737) Weight change:  Last BM Date: 02/11/16  Intake/Output from previous day: 04/21 0701 - 04/22 0700 In: -  Out: 1000 [Urine:1000]  PHYSICAL EXAM General appearance: alert, cooperative and mild distress Resp: clear to auscultation bilaterally Cardio: regular rate and rhythm, S1, S2 normal, no murmur, click, rub or gallop GI: soft, non-tender; bowel sounds normal; no masses,  no organomegaly Extremities: extremities normal, atraumatic, no cyanosis or edema  Lab Results:  Results for orders placed or performed during the hospital encounter of 02/07/16 (from the past 48 hour(s))  Basic metabolic panel     Status: Abnormal   Collection Time: 02/11/16  6:11 AM  Result Value Ref Range   Sodium 138 135 - 145 mmol/L   Potassium 4.0 3.5 - 5.1 mmol/L    Comment: DELTA CHECK NOTED   Chloride 108 101 - 111 mmol/L   CO2 22 22 - 32 mmol/L   Glucose, Bld 85 65 - 99 mg/dL   BUN 8 6 - 20 mg/dL   Creatinine, Ser 1.21 (H) 0.44 - 1.00 mg/dL   Calcium 7.9 (L) 8.9 - 10.3 mg/dL   GFR calc non Af Amer 42 (L) >60 mL/min   GFR calc Af Amer 49 (L) >60 mL/min    Comment: (NOTE) The eGFR has been calculated using the CKD EPI equation. This calculation has not been validated in all clinical situations. eGFR's persistently <60 mL/min signify possible Chronic Kidney Disease.    Anion gap 8 5 - 15  Basic metabolic panel     Status: Abnormal   Collection Time: 02/12/16  6:16 AM  Result Value Ref Range   Sodium 135 135 - 145 mmol/L   Potassium 3.9 3.5 - 5.1 mmol/L   Chloride 107 101 - 111 mmol/L   CO2 21 (L) 22 - 32 mmol/L   Glucose, Bld 91 65  - 99 mg/dL   BUN 5 (L) 6 - 20 mg/dL   Creatinine, Ser 1.09 (H) 0.44 - 1.00 mg/dL   Calcium 8.0 (L) 8.9 - 10.3 mg/dL   GFR calc non Af Amer 48 (L) >60 mL/min   GFR calc Af Amer 55 (L) >60 mL/min    Comment: (NOTE) The eGFR has been calculated using the CKD EPI equation. This calculation has not been validated in all clinical situations. eGFR's persistently <60 mL/min signify possible Chronic Kidney Disease.    Anion gap 7 5 - 15    ABGS No results for input(s): PHART, PO2ART, TCO2, HCO3 in the last 72 hours.  Invalid input(s): PCO2 CULTURES Recent Results (from the past 240 hour(s))  C difficile quick scan w PCR reflex     Status: Abnormal   Collection Time: 02/08/16  7:20 PM  Result Value Ref Range Status   C Diff antigen POSITIVE (A) NEGATIVE Final   C Diff toxin NEGATIVE NEGATIVE Final   C Diff interpretation   Final    C. difficile present, but toxin not detected. This indicates colonization. In most cases, this does not require treatment. If patient has signs and symptoms consistent with colitis, consider treatment. Requires ENTERIC precautions.  Gastrointestinal  Panel by PCR , Stool     Status: Abnormal   Collection Time: 02/08/16  7:20 PM  Result Value Ref Range Status   Campylobacter species NOT DETECTED NOT DETECTED Final   Plesimonas shigelloides NOT DETECTED NOT DETECTED Final   Salmonella species DETECTED (A) NOT DETECTED Final    Comment: CRITICAL RESULT CALLED TO, READ BACK BY AND VERIFIED WITH: LESLIE DAVIS AT 1750 02/09/2016 BY TFK    Yersinia enterocolitica NOT DETECTED NOT DETECTED Final   Vibrio species NOT DETECTED NOT DETECTED Final   Vibrio cholerae NOT DETECTED NOT DETECTED Final   Enteroaggregative E coli (EAEC) NOT DETECTED NOT DETECTED Final   Enteropathogenic E coli (EPEC) NOT DETECTED NOT DETECTED Final   Enterotoxigenic E coli (ETEC) NOT DETECTED NOT DETECTED Final   Shiga like toxin producing E coli (STEC) NOT DETECTED NOT DETECTED Final   E.  coli O157 NOT DETECTED NOT DETECTED Final   Shigella/Enteroinvasive E coli (EIEC) NOT DETECTED NOT DETECTED Final   Cryptosporidium NOT DETECTED NOT DETECTED Final   Cyclospora cayetanensis NOT DETECTED NOT DETECTED Final   Entamoeba histolytica NOT DETECTED NOT DETECTED Final   Giardia lamblia NOT DETECTED NOT DETECTED Final   Adenovirus F40/41 NOT DETECTED NOT DETECTED Final   Astrovirus NOT DETECTED NOT DETECTED Final   Norovirus GI/GII NOT DETECTED NOT DETECTED Final   Rotavirus A NOT DETECTED NOT DETECTED Final   Sapovirus (I, II, IV, and V) NOT DETECTED NOT DETECTED Final   Studies/Results: No results found.  Medications:  Prior to Admission:  Prescriptions prior to admission  Medication Sig Dispense Refill Last Dose  . acetaminophen (TYLENOL) 500 MG tablet Take 1,000 mg by mouth every 6 (six) hours as needed for mild pain.   Past Month at Unknown time  . albuterol (PROVENTIL HFA;VENTOLIN HFA) 108 (90 BASE) MCG/ACT inhaler Inhale 2 puffs into the lungs every 6 (six) hours as needed for wheezing or shortness of breath.   unknown at Unknown time  . albuterol (PROVENTIL) (2.5 MG/3ML) 0.083% nebulizer solution Take 2.5 mg by nebulization daily as needed for wheezing or shortness of breath.    unknown  . aliskiren (TEKTURNA) 150 MG tablet Take 150 mg by mouth 2 (two) times daily.    02/07/2016 at 830a  . ALPRAZolam (XANAX) 0.25 MG tablet Take 0.25 mg by mouth daily as needed for anxiety.    Past Month at Unknown time  . Calcium-Vitamin D-Vitamin K (CALCIUM SOFT CHEWS PO) Take 1,000 mg by mouth.   02/07/2016 at Unknown time  . cholecalciferol (VITAMIN D) 1000 UNITS tablet Take 1,000 Units by mouth daily.    02/07/2016 at Unknown time  . Cyanocobalamin (VITAMIN B 12 PO) Take 2,000 mcg by mouth daily.   02/07/2016 at Unknown time  . diltiazem (CARDIZEM CD) 180 MG 24 hr capsule Take 180 mg by mouth 2 (two) times daily.    02/07/2016 at 830a  . [EXPIRED] erythromycin ophthalmic ointment Apply 1  application to eye 2 (two) times daily. 14 day course starting on 01/26/2016   02/07/2016 at Unknown time  . ezetimibe (ZETIA) 10 MG tablet Take 10 mg by mouth daily.   02/07/2016 at Unknown time  . fish oil-omega-3 fatty acids 1000 MG capsule Take 1 g by mouth 2 (two) times daily.    02/07/2016 at Unknown time  . Fluticasone-Salmeterol (ADVAIR) 250-50 MCG/DOSE AEPB Inhale 1 puff into the lungs every 12 (twelve) hours.    02/07/2016 at Unknown time  . folic acid (FOLVITE)  400 MCG tablet Take 400 mcg by mouth daily.   02/07/2016 at Unknown time  . guanFACINE (TENEX) 1 MG tablet Take 1 mg by mouth at bedtime.     02/07/2016 at Unknown time  . hydrochlorothiazide (MICROZIDE) 12.5 MG capsule Take 12.5 mg by mouth daily.   Past Week at Unknown time  . multivitamin-iron-minerals-folic acid (CENTRUM) chewable tablet Chew 1 tablet by mouth daily.   02/07/2016 at Unknown time  . omeprazole (PRILOSEC) 40 MG capsule Take 40 mg by mouth daily.   02/07/2016 at Unknown time  . ondansetron (ZOFRAN ODT) 4 MG disintegrating tablet Take 1 tablet (4 mg total) by mouth every 8 (eight) hours as needed for nausea. 20 tablet 0 unknown  . potassium chloride (K-DUR) 10 MEQ tablet Take 10 mEq by mouth at bedtime.   02/06/2016 at Unknown time  . potassium chloride (KLOR-CON) 10 MEQ CR tablet Take 10 mEq by mouth daily.     02/07/2016 at Unknown time  . prednisoLONE acetate (PRED FORTE) 1 % ophthalmic suspension Place 1-2 drops into both eyes every hour while awake. 1 drop-Right eye, 2 drops-Left eye   02/07/2016 at Unknown time  . sodium chloride (MURO 128) 2 % ophthalmic solution Place 2 drops into both eyes 4 (four) times daily.    02/07/2016 at Unknown time   Scheduled: . aliskiren  300 mg Oral Daily  . ciprofloxacin  400 mg Intravenous Q12H  . diltiazem  360 mg Oral Daily  . fluticasone  2 spray Each Nare Daily  . fluticasone furoate-vilanterol  1 puff Inhalation Daily  . folic acid  616 mcg Oral Daily  . guanFACINE  1 mg Oral QHS   . metronidazole  500 mg Intravenous Q8H  . ondansetron (ZOFRAN) IV  8 mg Intravenous Q6H  . pantoprazole  40 mg Oral Daily  . potassium chloride  20 mEq Oral BID  . prednisoLONE acetate  1 drop Both Eyes Daily   Continuous: . sodium chloride 100 mL/hr at 02/09/16 0908   OHF:[id number], albuterol, ALPRAZolam, ondansetron **OR** ondansetron (ZOFRAN) IV, ondansetron, sodium chloride  Assesment: She was admitted with intractable nausea and vomiting dehydration and acute renal failure from that. She has some element of chronic renal failure also. She is likely somewhere stage II to stage III with GFR slightly less than 50  She has Salmonella gastroenteritis and is being treated for that and has improved markedly.  She has anxiety being treated with Xanax and that seems to be doing better.  She has hypertension and her blood pressure went up but has come back down.  She has asthma and chronic sinusitis at baseline Principal Problem:   Acute renal failure (ARF) ([facility]) Active Problems:   GERD (gastroesophageal reflux disease)   HTN (hypertension)   Nausea, vomiting and diarrhea   Intractable nausea and vomiting   Chronic renal failure, stage 3 (moderate)   Generalized anxiety disorder   Salmonella gastroenteritis    Plan: She will be discharged home today. Please see discharge summary for details    LOS: 4 days   Lavel Rieman L 02/12/2016, 9:41 AM

## 2016-02-12 NOTE — Progress Notes (Signed)
IV infiltrated and removed. New IV attempted with no success due to poor skin/viens. Pt refuses other attempts.

## 2016-02-17 ENCOUNTER — Ambulatory Visit (INDEPENDENT_AMBULATORY_CARE_PROVIDER_SITE_OTHER): Payer: Medicare Other | Admitting: Internal Medicine

## 2016-02-17 ENCOUNTER — Encounter (INDEPENDENT_AMBULATORY_CARE_PROVIDER_SITE_OTHER): Payer: Self-pay | Admitting: Internal Medicine

## 2016-02-17 VITALS — BP 130/70 | HR 64 | Temp 98.0°F | Ht 63.5 in | Wt 134.3 lb

## 2016-02-17 DIAGNOSIS — A029 Salmonella infection, unspecified: Secondary | ICD-10-CM

## 2016-02-17 NOTE — Progress Notes (Signed)
Subjective:    Patient ID: Stephanie West, female    DOB: 1941/08/22, 75 y.o.   MRN: [id number]  HPI Here today for f/u after recent admission to [facility] for diarrhea resulting in dehydration x 4 days.  Her GI pathogen came positive for salmonella. She says she bought lettuce and did not wash.  She was covered with Cipro and Flagyl.  She was covered with Flagyl for small bowel bacterial overgrowth. She tells me she was about 60% better on discharge. Today she presents and still has some nausea. She is having nausea after hour. She has had the nausea for about a week. She has been taking Zofran about twice a day which is helping.  She says her appetite is not good. Foods does not look good to her. She says she is forcing herself to eat. During the night she ate a cookie and glass of milk during the night.  She is eating 3 small meals a day and in between snacks. She takes Reglan between meals.  She feels about 75% better.  She says her stools are loose. She does have some formed small stools. She has one BM a day.  She says her stool had some form to it this am.  She thinks she may have lost about 8 pounds.  No side effects from the Reglan.            07/30/2015: EGD with ED(unplanned).  Indications: Patient is 27 old Caucasian female was chronic GERD and heartburns well controlled with therapy. She presented with dysphagia in June this ear. Barium study revealed distal esophageal stricture obstructing passage of barium pill. She underwent EGD on 04/15/2015 reeling Schatzki's ring which is distributed focal biopsy with resolution of dysphagia. Now she presents with 5 week history of daily postprandial pain and intractable nausea and she is not able to eat and she has lost 6 pounds. She denies dysphagia to solids but still has difficulty with pills. Workup includes normal LFTs and serum lipase. She had ultrasound negative for cholelithiasis and normal HIDA scan with CCK. Abdominopelvic CT  reveals atherosclerotic changes to go time calcification to the takeoff of celiac trunk and SMA as well as moderate-sized sliding hiatal hernia. She is undergoing diagnostic EGD.  Impression: High-grade Schatzki's ring which was disrupted with combination of scope balloon dilation and focal biopsy. Moderate-sized sliding hiatal hernia. Nonerosive antral gastritis. Biopsy taken.    Review of Systems Past Medical History  Diagnosis Date  . Hypertension   . Asthma   . Elevated transaminase level   . GERD (gastroesophageal reflux disease)   . MVP (mitral valve prolapse)     Past Surgical History  Procedure Laterality Date  . Laparoscopic tubal ligation  1984  . Colonoscopy    . Upper gastrointestinal endoscopy    . Esophagogastroduodenoscopy (egd) with esophageal dilation N/A 03/07/2013    Procedure: ESOPHAGOGASTRODUODENOSCOPY (EGD) WITH ESOPHAGEAL DILATION;  Surgeon: Rogene Houston, MD;  Location: [facility];  Service: Endoscopy;  Laterality: N/A;  830  . Esophagogastroduodenoscopy N/A 04/15/2015    Procedure: ESOPHAGOGASTRODUODENOSCOPY (EGD);  Surgeon: Rogene Houston, MD;  Location: [facility];  Service: Endoscopy;  Laterality: N/A;  125 - moved to 1:00, Ann to notify pt  . Esophagogastroduodenoscopy N/A 07/30/2015    Procedure: ESOPHAGOGASTRODUODENOSCOPY (EGD);  Surgeon: Rogene Houston, MD;  [facility]: [facility];  Service: Endoscopy;  Laterality: N/A;  1040  . Esophageal dilation  07/30/2015    Procedure: ESOPHAGEAL DILATION;  Surgeon: Rogene Houston, MD;  Location: [facility];  Service: Endoscopy;;    Allergies  Allergen Reactions  . Apple Other (See Comments)    Wheezing and Asthma Symptoms   . Aspirin Other (See Comments)    Wheezing, Asthma Symptoms   . Avelox [Moxifloxacin Hcl In Nacl] Hives    Pt can take cipro  . Bee Venom Other (See Comments)    Wheezing,  Asthma Symptoms  . Benadryl [Diphenhydramine Hcl] Other (See Comments)    Wheezing, Asthma Symptoms   . Chicken Allergy Other (See Comments)    Wheezing and Asthma Symptoms   . Codeine Other (See Comments)    Makes patient feel like she's going to pass out.   Haze Boyden [Gemifloxacin Mesylate] Hives  . Factive [Gemifloxacin] Hives  . Iodine   . Levaquin [Levofloxacin In D5w] Hives    Pt can take cipro  . Levofloxacin Hives  . Penicillins Other (See Comments)    Asthma Symptoms  . Sulfa Antibiotics Nausea Only  . Tequin [Gatifloxacin] Hives    Current Outpatient Prescriptions on File Prior to Visit  Medication Sig Dispense Refill  . acetaminophen (TYLENOL) 500 MG tablet Take 1,000 mg by mouth every 6 (six) hours as needed for mild pain.    Marland Kitchen albuterol (PROVENTIL HFA;VENTOLIN HFA) 108 (90 BASE) MCG/ACT inhaler Inhale 2 puffs into the lungs every 6 (six) hours as needed for wheezing or shortness of breath.    Marland Kitchen albuterol (PROVENTIL) (2.5 MG/3ML) 0.083% nebulizer solution Take 2.5 mg by nebulization daily as needed for wheezing or shortness of breath.     Marland Kitchen aliskiren (TEKTURNA) 150 MG tablet Take 150 mg by mouth 2 (two) times daily.     Marland Kitchen ALPRAZolam (XANAX) 0.25 MG tablet Take 0.25 mg by mouth daily as needed for anxiety.     . Calcium-Vitamin D-Vitamin K (CALCIUM SOFT CHEWS PO) Take 1,000 mg by mouth.    . cholecalciferol (VITAMIN D) 1000 UNITS tablet Take 1,000 Units by mouth daily.     . Cyanocobalamin (VITAMIN B 12 PO) Take 2,000 mcg by mouth daily.    Marland Kitchen diltiazem (CARDIZEM CD) 180 MG 24 hr capsule Take 180 mg by mouth 2 (two) times daily.     Marland Kitchen ezetimibe (ZETIA) 10 MG tablet Take 10 mg by mouth daily.    . fish oil-omega-3 fatty acids 1000 MG capsule Take 1 g by mouth 2 (two) times daily.     . Fluticasone-Salmeterol (ADVAIR) 250-50 MCG/DOSE AEPB Inhale 1 puff into the lungs every 12 (twelve) hours.     . folic acid (FOLVITE) A999333 MCG tablet Take 400 mcg by mouth daily.    Marland Kitchen  guanFACINE (TENEX) 1 MG tablet Take 1 mg by mouth at bedtime.      . metoCLOPramide (REGLAN) 5 MG/5ML solution Take 5 mLs (5 mg total) by mouth 3 (three) times daily before meals. 120 mL 0  . multivitamin-iron-minerals-folic acid (CENTRUM) chewable tablet Chew 1 tablet by mouth daily.    Marland Kitchen omeprazole (PRILOSEC) 40 MG capsule Take 40 mg by mouth daily.    . ondansetron (ZOFRAN ODT) 8 MG disintegrating tablet Take 1 tablet (8 mg total) by mouth every 6 (six) hours. 60 tablet 5  . potassium chloride (K-DUR) 10 MEQ tablet Take 10 mEq by mouth at bedtime.    . prednisoLONE acetate (PRED FORTE) 1 % ophthalmic suspension Place 1-2 drops into both eyes every hour while awake. 1 drop-Right eye, 2 drops-Left eye    .  promethazine (PHENERGAN) 25 MG tablet Take 0.5 tablets (12.5 mg total) by mouth every 8 (eight) hours as needed for refractory nausea / vomiting. 10 tablet 0  . sodium chloride (MURO 128) 2 % ophthalmic solution Place 2 drops into both eyes 4 (four) times daily.      No current facility-administered medications on file prior to visit.            Objective:   Physical Exam Blood pressure 130/70, pulse 64, temperature 98 F (36.7 C), height 5' 3.5" (1.613 m), weight 134 lb 4.8 oz (60.918 kg). Alert and oriented. Skin warm and dry. Oral mucosa is moist.   . Sclera anicteric, conjunctivae is pink. Thyroid not enlarged. No cervical lymphadenopathy. Lungs clear. Heart regular rate and rhythm.  Abdomen is soft. Bowel sounds are positive. No hepatomegaly. No abdominal masses felt. No tenderness.  No edema to lower extremities.          Assessment & Plan:  Salmonella which is much now.  CBC and Cmet.  OV in 2 months.

## 2016-02-17 NOTE — Patient Instructions (Signed)
Labs today. OV 2 months.

## 2016-02-18 LAB — COMPREHENSIVE METABOLIC PANEL
ALK PHOS: 62 U/L (ref 33–130)
ALT: 10 U/L (ref 6–29)
AST: 16 U/L (ref 10–35)
Albumin: 3.7 g/dL (ref 3.6–5.1)
BUN: 10 mg/dL (ref 7–25)
CALCIUM: 9.4 mg/dL (ref 8.6–10.4)
CHLORIDE: 98 mmol/L (ref 98–110)
CO2: 26 mmol/L (ref 20–31)
Creat: 1.47 mg/dL — ABNORMAL HIGH (ref 0.60–0.93)
GLUCOSE: 106 mg/dL — AB (ref 65–99)
POTASSIUM: 5.1 mmol/L (ref 3.5–5.3)
SODIUM: 134 mmol/L — AB (ref 135–146)
Total Bilirubin: 0.3 mg/dL (ref 0.2–1.2)
Total Protein: 7.1 g/dL (ref 6.1–8.1)

## 2016-02-18 LAB — CBC WITH DIFFERENTIAL/PLATELET
BASOS ABS: 0 {cells}/uL (ref 0–200)
Basophils Relative: 0 %
EOS ABS: 216 {cells}/uL (ref 15–500)
EOS PCT: 2 %
HCT: 39.6 % (ref 35.0–45.0)
Hemoglobin: 12.9 g/dL (ref 11.7–15.5)
LYMPHS PCT: 24 %
Lymphs Abs: 2592 cells/uL (ref 850–3900)
MCH: 29.5 pg (ref 27.0–33.0)
MCHC: 32.6 g/dL (ref 32.0–36.0)
MCV: 90.6 fL (ref 80.0–100.0)
MONOS PCT: 8 %
MPV: 9.3 fL (ref 7.5–12.5)
Monocytes Absolute: 864 cells/uL (ref 200–950)
Neutro Abs: 7128 cells/uL (ref 1500–7800)
Neutrophils Relative %: 66 %
PLATELETS: 408 10*3/uL — AB (ref 140–400)
RBC: 4.37 MIL/uL (ref 3.80–5.10)
RDW: 15.9 % — AB (ref 11.0–15.0)
WBC: 10.8 10*3/uL (ref 3.8–10.8)

## 2016-03-01 DIAGNOSIS — I10 Essential (primary) hypertension: Secondary | ICD-10-CM | POA: Diagnosis not present

## 2016-03-01 DIAGNOSIS — A020 Salmonella enteritis: Secondary | ICD-10-CM | POA: Diagnosis not present

## 2016-03-01 DIAGNOSIS — F419 Anxiety disorder, unspecified: Secondary | ICD-10-CM | POA: Diagnosis not present

## 2016-03-01 DIAGNOSIS — J45909 Unspecified asthma, uncomplicated: Secondary | ICD-10-CM | POA: Diagnosis not present

## 2016-04-10 DIAGNOSIS — F419 Anxiety disorder, unspecified: Secondary | ICD-10-CM | POA: Diagnosis not present

## 2016-04-10 DIAGNOSIS — K5900 Constipation, unspecified: Secondary | ICD-10-CM | POA: Diagnosis not present

## 2016-04-10 DIAGNOSIS — I10 Essential (primary) hypertension: Secondary | ICD-10-CM | POA: Diagnosis not present

## 2016-04-10 DIAGNOSIS — A020 Salmonella enteritis: Secondary | ICD-10-CM | POA: Diagnosis not present

## 2016-04-18 ENCOUNTER — Ambulatory Visit (INDEPENDENT_AMBULATORY_CARE_PROVIDER_SITE_OTHER): Payer: Medicare Other | Admitting: Internal Medicine

## 2016-05-10 ENCOUNTER — Encounter (INDEPENDENT_AMBULATORY_CARE_PROVIDER_SITE_OTHER): Payer: Self-pay | Admitting: Internal Medicine

## 2016-05-10 ENCOUNTER — Ambulatory Visit (INDEPENDENT_AMBULATORY_CARE_PROVIDER_SITE_OTHER): Payer: Medicare Other | Admitting: Internal Medicine

## 2016-05-10 VITALS — BP 180/82 | HR 80 | Temp 98.1°F | Ht 63.0 in | Wt 130.8 lb

## 2016-05-10 DIAGNOSIS — A029 Salmonella infection, unspecified: Secondary | ICD-10-CM

## 2016-05-10 DIAGNOSIS — R748 Abnormal levels of other serum enzymes: Secondary | ICD-10-CM | POA: Diagnosis not present

## 2016-05-10 NOTE — Patient Instructions (Signed)
OV in 1 year.  

## 2016-05-10 NOTE — Progress Notes (Addendum)
Subjective:    Patient ID: Stephanie West, female    DOB: 04-21-1941, 75 y.o.   MRN: [id number]  HPIHere today for f/u. Last seen in April of this year. Admitted to [facility] in April for dehydration x 4 day. GI pathogen was positive for salmonella.  She was covered with Cipro and Flagyl. She tells me she is doing good. She does have some nausea which may last about 10 minutes. Nausea does not occur daily. Her appetite is good. No dysphagia.  She has a BM every other day. No melena or BRRB.Her stools are nice and formed. No diarrhea.  She is eating 3 meals a day. She is a Psychologist, occupational at the [employer].  She feels 100% better.   Per records hx of elevated transaminases. Transaminases in April were normal.     06/29/2004: Total colonoscopy.  INDICATIONS: Stephanie West is a 69 year old Caucasian female who is here primarily for screening exam. This is prompted by change in bowel habits, her constipation resulting from verapamil which has been discontinued, and she is back to having bowel movement daily or every other day. Family history is negative for colorectal carcinoma. Procedure and risks were reviewed with the patient and informed consent was obtained. 1. Pan colic diverticulosis. She has a few scattered diverticula mainly at  the sigmoid colon and some proximal to splenic flexure. 2. Small external hemorrhoids. 3. Mild melanosis coli.   CMP Latest Ref Rng 02/17/2016 02/12/2016 02/11/2016  Glucose 65 - 99 mg/dL 106(H) 91 85  BUN 7 - 25 mg/dL 10 5(L) 8  Creatinine 0.60 - 0.93 mg/dL 1.47(H) 1.09(H) 1.21(H)  Sodium 135 - 146 mmol/L 134(L) 135 138  Potassium 3.5 - 5.3 mmol/L 5.1 3.9 4.0  Chloride 98 - 110 mmol/L 98 107 108  CO2 20 - 31 mmol/L 26 21(L) 22  Calcium 8.6 - 10.4 mg/dL 9.4 8.0(L) 7.9(L)  Total Protein 6.1 - 8.1 g/dL 7.1 - -  Total Bilirubin 0.2 - 1.2 mg/dL 0.3 - -  Alkaline Phos 33 - 130 U/L 62 - -  AST 10 - 35 U/L 16 - -  ALT 6 - 29 U/L 10 - -    Hepatic  Function Panel     Component Value Date/Time   PROT 7.1 02/17/2016 1359   ALBUMIN 3.7 02/17/2016 1359   AST 16 02/17/2016 1359   ALT 10 02/17/2016 1359   ALKPHOS 62 02/17/2016 1359   BILITOT 0.3 02/17/2016 1359   BILIDIR 0.1 02/06/2014 0934   IBILI 0.3 02/06/2014 0934      Review of Systems     Past Medical History  Diagnosis Date  . Hypertension   . Asthma   . Elevated transaminase level   . GERD (gastroesophageal reflux disease)   . MVP (mitral valve prolapse)     Past Surgical History  Procedure Laterality Date  . Laparoscopic tubal ligation  1984  . Colonoscopy    . Upper gastrointestinal endoscopy    . Esophagogastroduodenoscopy (egd) with esophageal dilation N/A 03/07/2013    Procedure: ESOPHAGOGASTRODUODENOSCOPY (EGD) WITH ESOPHAGEAL DILATION;  Surgeon: Rogene Houston, MD;  [facility]: [facility];  Service: Endoscopy;  Laterality: N/A;  830  . Esophagogastroduodenoscopy N/A 04/15/2015    Procedure: ESOPHAGOGASTRODUODENOSCOPY (EGD);  Surgeon: Rogene Houston, MD;  [facility]: [facility];  Service: Endoscopy;  Laterality: N/A;  125 - moved to 1:00, Ann to notify pt  . Esophagogastroduodenoscopy N/A 07/30/2015    Procedure: ESOPHAGOGASTRODUODENOSCOPY (EGD);  Surgeon: Rogene Houston, MD;  [facility]: [facility];  Service: Endoscopy;  Laterality: N/A;  1040  . Esophageal dilation  07/30/2015    Procedure: ESOPHAGEAL DILATION;  Surgeon: Rogene Houston, MD;  Location: [facility];  Service: Endoscopy;;    Allergies  Allergen Reactions  . Apple Other (See Comments)    Wheezing and Asthma Symptoms   . Aspirin Other (See Comments)    Wheezing, Asthma Symptoms   . Avelox [Moxifloxacin Hcl In Nacl] Hives    Pt can take cipro  . Bee Venom Other (See Comments)    Wheezing, Asthma Symptoms  . Benadryl [Diphenhydramine Hcl] Other (See Comments)    Wheezing, Asthma Symptoms   . Chicken Allergy Other (See Comments)    Wheezing and Asthma Symptoms   . Codeine Other  (See Comments)    Makes patient feel like she's going to pass out.   Stephanie West [Gemifloxacin Mesylate] Hives  . Factive [Gemifloxacin] Hives  . Iodine   . Levaquin [Levofloxacin In D5w] Hives    Pt can take cipro  . Levofloxacin Hives  . Penicillins Other (See Comments)    Asthma Symptoms  . Sulfa Antibiotics Nausea Only  . Tequin [Gatifloxacin] Hives    Current Outpatient Prescriptions on File Prior to Visit  Medication Sig Dispense Refill  . acetaminophen (TYLENOL) 500 MG tablet Take 1,000 mg by mouth every 6 (six) hours as needed for mild pain.    Marland Kitchen albuterol (PROVENTIL HFA;VENTOLIN HFA) 108 (90 BASE) MCG/ACT inhaler Inhale 2 puffs into the lungs every 6 (six) hours as needed for wheezing or shortness of breath.    Marland Kitchen albuterol (PROVENTIL) (2.5 MG/3ML) 0.083% nebulizer solution Take 2.5 mg by nebulization daily as needed for wheezing or shortness of breath.     Marland Kitchen aliskiren (TEKTURNA) 150 MG tablet Take 150 mg by mouth 2 (two) times daily.     Marland Kitchen ALPRAZolam (XANAX) 0.25 MG tablet Take 0.25 mg by mouth daily as needed for anxiety.     . cholecalciferol (VITAMIN D) 1000 UNITS tablet Take 1,000 Units by mouth daily.     . Cyanocobalamin (VITAMIN B 12 PO) Take 2,000 mcg by mouth daily.    Marland Kitchen diltiazem (CARDIZEM CD) 180 MG 24 hr capsule Take 180 mg by mouth 2 (two) times daily.     Marland Kitchen ezetimibe (ZETIA) 10 MG tablet Take 10 mg by mouth daily.    . fish oil-omega-3 fatty acids 1000 MG capsule Take 1 g by mouth 2 (two) times daily.     . Fluticasone-Salmeterol (ADVAIR) 250-50 MCG/DOSE AEPB Inhale 1 puff into the lungs every 12 (twelve) hours.     . folic acid (FOLVITE) A999333 MCG tablet Take 400 mcg by mouth daily.    Marland Kitchen guanFACINE (TENEX) 1 MG tablet Take 1 mg by mouth at bedtime.      . multivitamin-iron-minerals-folic acid (CENTRUM) chewable tablet Chew 1 tablet by mouth daily.    Marland Kitchen omeprazole (PRILOSEC) 40 MG capsule Take 40 mg by mouth daily.    . ondansetron (ZOFRAN ODT) 8 MG disintegrating  tablet Take 1 tablet (8 mg total) by mouth every 6 (six) hours. 60 tablet 5  . potassium chloride (K-DUR) 10 MEQ tablet Take 10 mEq by mouth at bedtime.    . prednisoLONE acetate (PRED FORTE) 1 % ophthalmic suspension Place 1-2 drops into both eyes every hour while awake. 1 drop-Right eye, 2 drops-Left eye    . sodium chloride (MURO 128) 2 % ophthalmic solution Place 2 drops into both eyes  4 (four) times daily. Reported on 05/10/2016     No current facility-administered medications on file prior to visit.     Objective:   Physical Exam Blood pressure 180/82, pulse 80, temperature 98.1 F (36.7 C), height 5\' 3"  (1.6 m), weight 130 lb 12.8 oz (59.33 kg).  Alert and oriented. Skin warm and dry. Oral mucosa is moist.   . Sclera anicteric, conjunctivae is pink. Thyroid not enlarged. No cervical lymphadenopathy. Lungs clear. Heart regular rate and rhythm.  Abdomen is soft. Bowel sounds are positive. No hepatomegaly. No abdominal masses felt. No tenderness.  No edema to lower extremities.          Assessment & Plan:  Salmonella: She is doing well. No GI symptoms. No bloating. Elevated transaminases: Transaminases in April normla.  OV in 1 year. She will let our office know when she is ready for colonoscopy. Last colonoscopy 2015.

## 2016-05-22 DIAGNOSIS — R109 Unspecified abdominal pain: Secondary | ICD-10-CM | POA: Diagnosis not present

## 2016-05-22 DIAGNOSIS — I10 Essential (primary) hypertension: Secondary | ICD-10-CM | POA: Diagnosis not present

## 2016-05-22 DIAGNOSIS — F419 Anxiety disorder, unspecified: Secondary | ICD-10-CM | POA: Diagnosis not present

## 2016-05-22 DIAGNOSIS — J449 Chronic obstructive pulmonary disease, unspecified: Secondary | ICD-10-CM | POA: Diagnosis not present

## 2016-05-24 DIAGNOSIS — H3581 Retinal edema: Secondary | ICD-10-CM | POA: Diagnosis not present

## 2016-06-05 DIAGNOSIS — R109 Unspecified abdominal pain: Secondary | ICD-10-CM | POA: Diagnosis not present

## 2016-06-05 DIAGNOSIS — R000 Tachycardia, unspecified: Secondary | ICD-10-CM | POA: Diagnosis not present

## 2016-06-05 DIAGNOSIS — I10 Essential (primary) hypertension: Secondary | ICD-10-CM | POA: Diagnosis not present

## 2016-06-05 DIAGNOSIS — J449 Chronic obstructive pulmonary disease, unspecified: Secondary | ICD-10-CM | POA: Diagnosis not present

## 2016-06-12 DIAGNOSIS — I10 Essential (primary) hypertension: Secondary | ICD-10-CM | POA: Diagnosis not present

## 2016-06-12 DIAGNOSIS — R000 Tachycardia, unspecified: Secondary | ICD-10-CM | POA: Diagnosis not present

## 2016-06-12 DIAGNOSIS — K219 Gastro-esophageal reflux disease without esophagitis: Secondary | ICD-10-CM | POA: Diagnosis not present

## 2016-06-12 DIAGNOSIS — J449 Chronic obstructive pulmonary disease, unspecified: Secondary | ICD-10-CM | POA: Diagnosis not present

## 2016-06-16 ENCOUNTER — Other Ambulatory Visit: Payer: Self-pay

## 2016-06-22 DIAGNOSIS — H35423 Microcystoid degeneration of retina, bilateral: Secondary | ICD-10-CM | POA: Diagnosis not present

## 2016-06-22 DIAGNOSIS — H18233 Secondary corneal edema, bilateral: Secondary | ICD-10-CM | POA: Diagnosis not present

## 2016-06-22 DIAGNOSIS — H43813 Vitreous degeneration, bilateral: Secondary | ICD-10-CM | POA: Diagnosis not present

## 2016-07-06 DIAGNOSIS — I10 Essential (primary) hypertension: Secondary | ICD-10-CM | POA: Diagnosis not present

## 2016-07-06 DIAGNOSIS — J329 Chronic sinusitis, unspecified: Secondary | ICD-10-CM | POA: Diagnosis not present

## 2016-07-06 DIAGNOSIS — K219 Gastro-esophageal reflux disease without esophagitis: Secondary | ICD-10-CM | POA: Diagnosis not present

## 2016-07-06 DIAGNOSIS — J449 Chronic obstructive pulmonary disease, unspecified: Secondary | ICD-10-CM | POA: Diagnosis not present

## 2016-07-27 DIAGNOSIS — I10 Essential (primary) hypertension: Secondary | ICD-10-CM | POA: Diagnosis not present

## 2016-07-27 DIAGNOSIS — J329 Chronic sinusitis, unspecified: Secondary | ICD-10-CM | POA: Diagnosis not present

## 2016-07-27 DIAGNOSIS — K219 Gastro-esophageal reflux disease without esophagitis: Secondary | ICD-10-CM | POA: Diagnosis not present

## 2016-07-27 DIAGNOSIS — J449 Chronic obstructive pulmonary disease, unspecified: Secondary | ICD-10-CM | POA: Diagnosis not present

## 2016-08-03 DIAGNOSIS — H18233 Secondary corneal edema, bilateral: Secondary | ICD-10-CM | POA: Diagnosis not present

## 2016-08-03 DIAGNOSIS — H35423 Microcystoid degeneration of retina, bilateral: Secondary | ICD-10-CM | POA: Diagnosis not present

## 2016-08-03 DIAGNOSIS — H43813 Vitreous degeneration, bilateral: Secondary | ICD-10-CM | POA: Diagnosis not present

## 2016-08-17 DIAGNOSIS — F419 Anxiety disorder, unspecified: Secondary | ICD-10-CM | POA: Diagnosis not present

## 2016-08-17 DIAGNOSIS — R000 Tachycardia, unspecified: Secondary | ICD-10-CM | POA: Diagnosis not present

## 2016-08-17 DIAGNOSIS — Z23 Encounter for immunization: Secondary | ICD-10-CM | POA: Diagnosis not present

## 2016-08-17 DIAGNOSIS — K219 Gastro-esophageal reflux disease without esophagitis: Secondary | ICD-10-CM | POA: Diagnosis not present

## 2016-08-17 DIAGNOSIS — I10 Essential (primary) hypertension: Secondary | ICD-10-CM | POA: Diagnosis not present

## 2016-09-22 DIAGNOSIS — F419 Anxiety disorder, unspecified: Secondary | ICD-10-CM | POA: Diagnosis not present

## 2016-09-22 DIAGNOSIS — R000 Tachycardia, unspecified: Secondary | ICD-10-CM | POA: Diagnosis not present

## 2016-09-22 DIAGNOSIS — I10 Essential (primary) hypertension: Secondary | ICD-10-CM | POA: Diagnosis not present

## 2016-09-22 DIAGNOSIS — K219 Gastro-esophageal reflux disease without esophagitis: Secondary | ICD-10-CM | POA: Diagnosis not present

## 2016-09-27 DIAGNOSIS — K219 Gastro-esophageal reflux disease without esophagitis: Secondary | ICD-10-CM | POA: Diagnosis not present

## 2016-09-27 DIAGNOSIS — J329 Chronic sinusitis, unspecified: Secondary | ICD-10-CM | POA: Diagnosis not present

## 2016-09-27 DIAGNOSIS — J441 Chronic obstructive pulmonary disease with (acute) exacerbation: Secondary | ICD-10-CM | POA: Diagnosis not present

## 2016-09-27 DIAGNOSIS — I10 Essential (primary) hypertension: Secondary | ICD-10-CM | POA: Diagnosis not present

## 2016-10-21 IMAGING — DX DG ABDOMEN ACUTE W/ 1V CHEST
4 series · 4 of 4 positions shown · non-contrast
Comparison: Chest 10/25/2013

CLINICAL DATA: Nausea since [REDACTED]. No vomiting. Epigastric
abdominal discomfort.

EXAM:
DG ABDOMEN ACUTE W/ 1V CHEST

[chest pa]
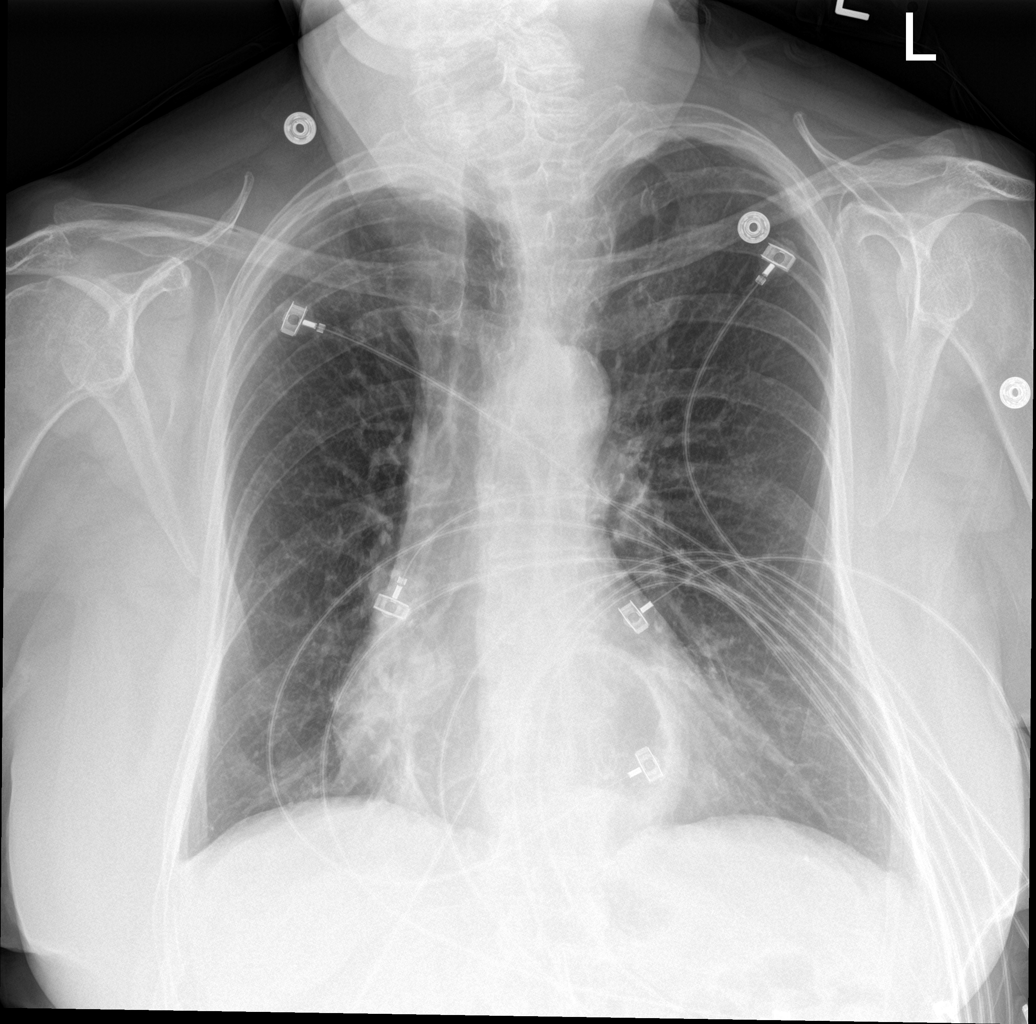

[abdomen erect]
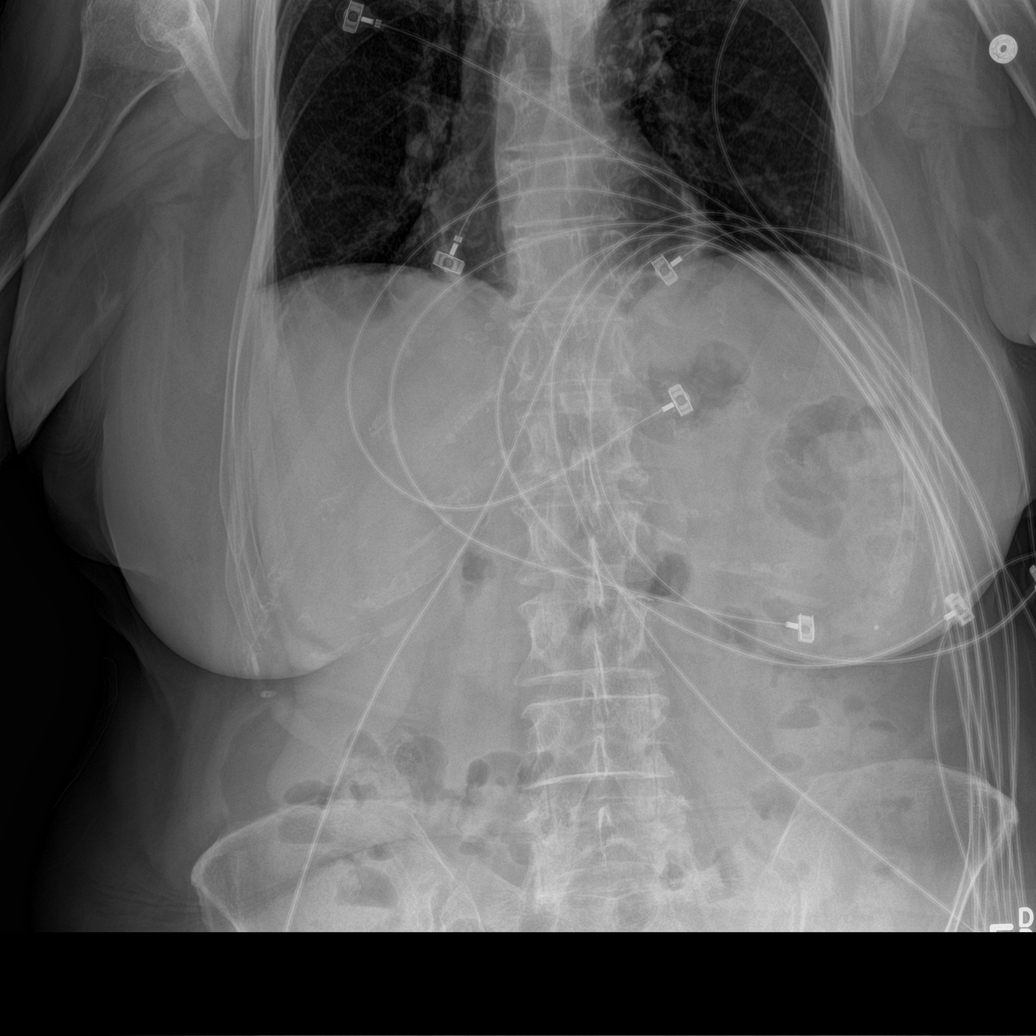

[abdomen supine (1 of 2)]
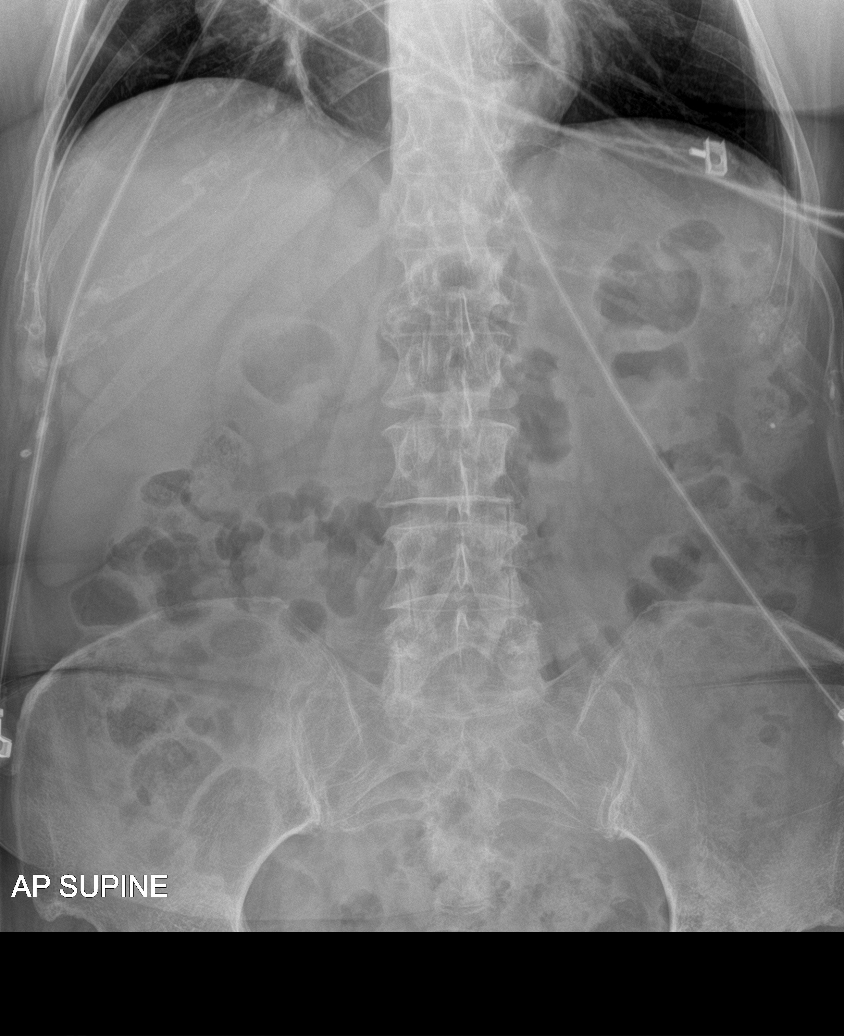

[abdomen supine (2 of 2)]
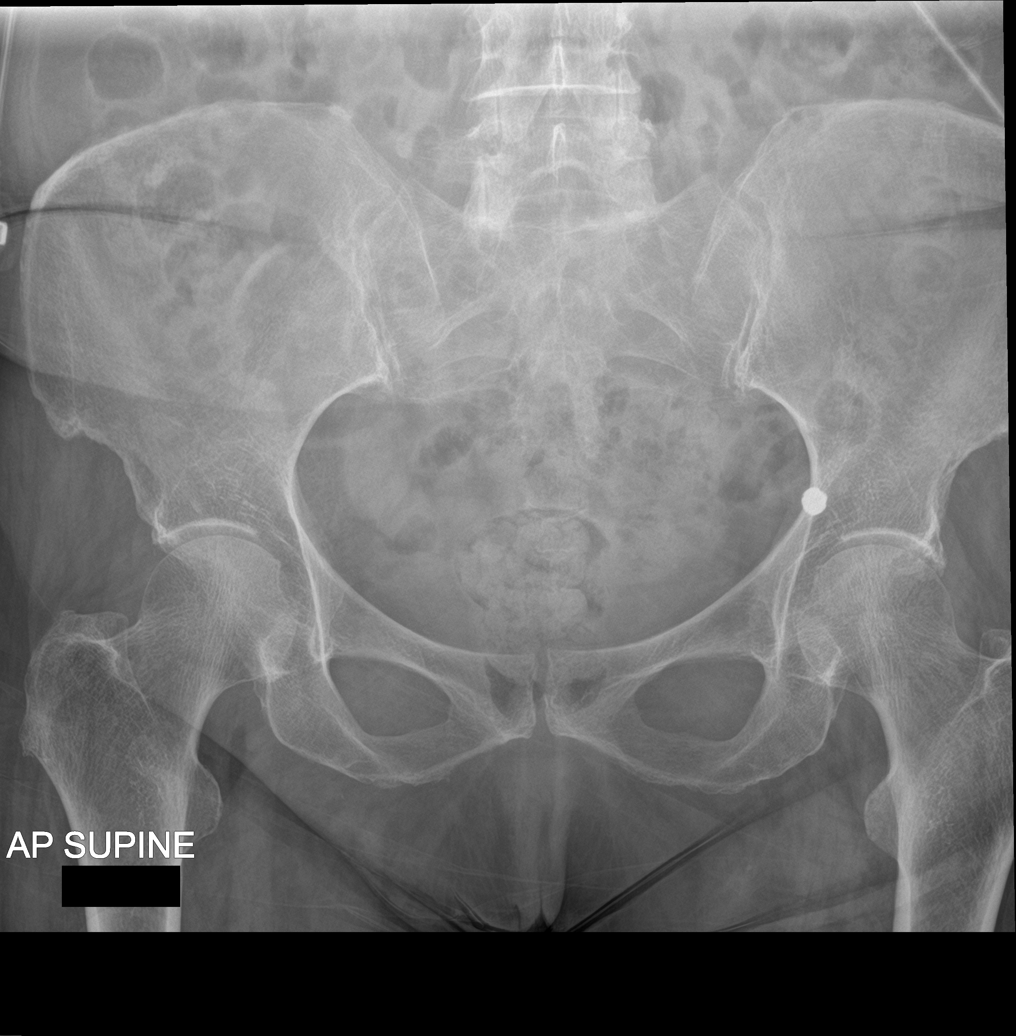

[4 of 4 positions shown; findings below may reference images not displayed]

FINDINGS: Mild hyperinflation. No focal airspace disease or consolidation in
the lungs. Normal heart size and pulmonary vascularity. Mediastinal
contours appear intact. Moderately large esophageal hiatal hernia
behind the heart.

Scattered gas and stool in the colon. No small or large bowel
distention. No free intra-abdominal air. No abnormal air-fluid
levels. No radiopaque stones. Degenerative changes in the lumbar
spine. Calcification or metallic foreign body demonstrated in the
left pelvic region.
IMPRESSION: Moderately large esophageal hiatal hernia. No evidence of active
pulmonary disease. Nonobstructive bowel gas pattern.

## 2017-02-07 DIAGNOSIS — J441 Chronic obstructive pulmonary disease with (acute) exacerbation: Secondary | ICD-10-CM | POA: Diagnosis not present

## 2017-02-07 DIAGNOSIS — K219 Gastro-esophageal reflux disease without esophagitis: Secondary | ICD-10-CM | POA: Diagnosis not present

## 2017-02-07 DIAGNOSIS — I10 Essential (primary) hypertension: Secondary | ICD-10-CM | POA: Diagnosis not present

## 2017-02-07 DIAGNOSIS — F321 Major depressive disorder, single episode, moderate: Secondary | ICD-10-CM | POA: Diagnosis not present

## 2017-02-26 DIAGNOSIS — H4063X0 Glaucoma secondary to drugs, bilateral, stage unspecified: Secondary | ICD-10-CM | POA: Diagnosis not present

## 2017-02-26 DIAGNOSIS — T86840 Corneal transplant rejection: Secondary | ICD-10-CM | POA: Diagnosis not present

## 2017-02-26 DIAGNOSIS — H1851 Endothelial corneal dystrophy: Secondary | ICD-10-CM | POA: Diagnosis not present

## 2017-02-26 DIAGNOSIS — T380X5A Adverse effect of glucocorticoids and synthetic analogues, initial encounter: Secondary | ICD-10-CM | POA: Diagnosis not present

## 2017-03-07 DIAGNOSIS — J449 Chronic obstructive pulmonary disease, unspecified: Secondary | ICD-10-CM | POA: Diagnosis not present

## 2017-03-07 DIAGNOSIS — I10 Essential (primary) hypertension: Secondary | ICD-10-CM | POA: Diagnosis not present

## 2017-03-07 DIAGNOSIS — R079 Chest pain, unspecified: Secondary | ICD-10-CM | POA: Diagnosis not present

## 2017-03-07 DIAGNOSIS — R779 Abnormality of plasma protein, unspecified: Secondary | ICD-10-CM | POA: Diagnosis not present

## 2017-03-07 DIAGNOSIS — F419 Anxiety disorder, unspecified: Secondary | ICD-10-CM | POA: Diagnosis not present

## 2017-03-12 DIAGNOSIS — Z947 Corneal transplant status: Secondary | ICD-10-CM | POA: Diagnosis not present

## 2017-04-05 DIAGNOSIS — H40043 Steroid responder, bilateral: Secondary | ICD-10-CM | POA: Diagnosis not present

## 2017-04-09 DIAGNOSIS — H40043 Steroid responder, bilateral: Secondary | ICD-10-CM | POA: Diagnosis not present

## 2017-04-09 DIAGNOSIS — Z947 Corneal transplant status: Secondary | ICD-10-CM | POA: Diagnosis not present

## 2017-04-11 DIAGNOSIS — I10 Essential (primary) hypertension: Secondary | ICD-10-CM | POA: Diagnosis not present

## 2017-04-11 DIAGNOSIS — F419 Anxiety disorder, unspecified: Secondary | ICD-10-CM | POA: Diagnosis not present

## 2017-04-11 DIAGNOSIS — F322 Major depressive disorder, single episode, severe without psychotic features: Secondary | ICD-10-CM | POA: Diagnosis not present

## 2017-04-11 DIAGNOSIS — J449 Chronic obstructive pulmonary disease, unspecified: Secondary | ICD-10-CM | POA: Diagnosis not present

## 2017-04-12 ENCOUNTER — Encounter (INDEPENDENT_AMBULATORY_CARE_PROVIDER_SITE_OTHER): Payer: Self-pay | Admitting: Internal Medicine

## 2017-04-12 ENCOUNTER — Encounter (INDEPENDENT_AMBULATORY_CARE_PROVIDER_SITE_OTHER): Payer: Self-pay

## 2017-05-01 DIAGNOSIS — F419 Anxiety disorder, unspecified: Secondary | ICD-10-CM | POA: Diagnosis not present

## 2017-05-01 DIAGNOSIS — J449 Chronic obstructive pulmonary disease, unspecified: Secondary | ICD-10-CM | POA: Diagnosis not present

## 2017-05-01 DIAGNOSIS — K219 Gastro-esophageal reflux disease without esophagitis: Secondary | ICD-10-CM | POA: Diagnosis not present

## 2017-05-01 DIAGNOSIS — I10 Essential (primary) hypertension: Secondary | ICD-10-CM | POA: Diagnosis not present

## 2017-05-10 ENCOUNTER — Ambulatory Visit (INDEPENDENT_AMBULATORY_CARE_PROVIDER_SITE_OTHER): Payer: Medicare Other | Admitting: Internal Medicine

## 2017-05-10 ENCOUNTER — Encounter (INDEPENDENT_AMBULATORY_CARE_PROVIDER_SITE_OTHER): Payer: Self-pay | Admitting: Internal Medicine

## 2017-05-10 VITALS — BP 118/72 | HR 64 | Temp 97.5°F | Resp 14 | Ht 63.0 in | Wt 121.6 lb

## 2017-05-10 DIAGNOSIS — R1319 Other dysphagia: Secondary | ICD-10-CM

## 2017-05-10 DIAGNOSIS — R1310 Dysphagia, unspecified: Secondary | ICD-10-CM | POA: Diagnosis not present

## 2017-05-10 HISTORY — DX: Dysphagia, unspecified: R13.10

## 2017-05-10 NOTE — Progress Notes (Signed)
Subjective:    Patient ID: Stephanie West, female    DOB: 07/19/1941, 76 y.o.   MRN: [id number]  HPI  Here today for f/u. Last seen in July of 2017.  Hx of Salmonella last year. She was covered with Cipro and Flagyl.  She tells me she is doing good.  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  She stays active. Hx of elevated liver enzymes. Last enzymes in 2017 were normal.  Her appetite is okay. Her weight in 2017  Was 130. Today her weight is 121.6.  Hx of dysphagia. Last EGD/ED 2016.  She states she has no problems with swallowing.   Hepatic Function Latest Ref Rng & Units 02/17/2016 02/07/2016 07/03/2015  Total Protein 6.1 - 8.1 g/dL 7.1 7.5 7.7  Albumin 3.6 - 5.1 g/dL 3.7 3.4(L) 4.1  AST 10 - 35 U/L _0 ALT 6 - 29 U/L 10 13(L) 15  Alk Phosphatase 33 - 130 U/L 62 61 58  Total Bilirubin 0.2 - 1.2 mg/dL 0.3 0.5 0.4  Bilirubin, Direct 0.0 - 0.3 mg/dL - - -      06/29/2004: Total colonoscopy.  INDICATIONS: Stephanie West is a 84 year old Caucasian female who is here primarily for screening exam. This is prompted by change in bowel habits, her constipation resulting from verapamil which has been discontinued, and she is back to having bowel movement daily or every other day. Family history is negative for colorectal carcinoma. Procedure and risks were reviewed with the patient and informed consent was obtained. 1. Pan colic diverticulosis. She has a few scattered diverticula mainly at  the sigmoid colon and some proximal to splenic flexure. 2. Small external hemorrhoids. 3. Mild melanosis coli.  Review of Systems Past Medical History:  Diagnosis Date  . Asthma   . Elevated transaminase level   . GERD (gastroesophageal reflux disease)   . Hypertension   . MVP (mitral valve prolapse)     Past Surgical History:  Procedure Laterality Date  . COLONOSCOPY    . ESOPHAGEAL DILATION  07/30/2015   Procedure: ESOPHAGEAL DILATION;  Surgeon: Rogene Houston,  MD;  Location: [facility];  Service: Endoscopy;;  . ESOPHAGOGASTRODUODENOSCOPY N/A 04/15/2015   Procedure: ESOPHAGOGASTRODUODENOSCOPY (EGD);  Surgeon: Rogene Houston, MD;  [facility]: [facility];  Service: Endoscopy;  Laterality: N/A;  125 - moved to 1:00, Ann to notify pt  . ESOPHAGOGASTRODUODENOSCOPY N/A 07/30/2015   Procedure: ESOPHAGOGASTRODUODENOSCOPY (EGD);  Surgeon: Rogene Houston, MD;  [facility]: [facility];  Service: Endoscopy;  Laterality: N/A;  1040  . ESOPHAGOGASTRODUODENOSCOPY (EGD) WITH ESOPHAGEAL DILATION N/A 03/07/2013   Procedure: ESOPHAGOGASTRODUODENOSCOPY (EGD) WITH ESOPHAGEAL DILATION;  Surgeon: Rogene Houston, MD;  [facility]: [facility];  Service: Endoscopy;  Laterality: N/A;  830  . LAPAROSCOPIC TUBAL LIGATION  1984  . UPPER GASTROINTESTINAL ENDOSCOPY      Allergies  Allergen Reactions  . Apple Other (See Comments)    Wheezing and Asthma Symptoms   . Aspirin Other (See Comments)    Wheezing, Asthma Symptoms   . Avelox [Moxifloxacin Hcl In Nacl] Hives    Pt can take cipro  . Bee Venom Other (See Comments)    Wheezing, Asthma Symptoms  . Benadryl [Diphenhydramine Hcl] Other (See Comments)    Wheezing, Asthma Symptoms   . Chicken Allergy Other (See Comments)    Wheezing and Asthma Symptoms   . Codeine Other (See Comments)    Makes patient feel like she's going to pass out.   Marland Kitchen  Factive [Gemifloxacin Mesylate] Hives  . Factive [Gemifloxacin] Hives  . Iodine   . Levaquin [Levofloxacin In D5w] Hives    Pt can take cipro  . Levofloxacin Hives  . Penicillins Other (See Comments)    Asthma Symptoms  . Sulfa Antibiotics Nausea Only  . Tequin [Gatifloxacin] Hives    Current Outpatient Prescriptions on File Prior to Visit  Medication Sig Dispense Refill  . albuterol (PROVENTIL HFA;VENTOLIN HFA) 108 (90 BASE) MCG/ACT inhaler Inhale 2 puffs into the lungs every 6 (six) hours as needed for wheezing or shortness of breath.    Marland Kitchen aliskiren (TEKTURNA) 150 MG tablet  Take 150 mg by mouth 2 (two) times daily.     Marland Kitchen ALPRAZolam (XANAX) 0.25 MG tablet Take 0.25 mg by mouth daily as needed for anxiety.     . Cyanocobalamin (VITAMIN B 12 PO) Take 2,000 mcg by mouth daily.    Marland Kitchen diltiazem (CARDIZEM CD) 180 MG 24 hr capsule Take 180 mg by mouth 2 (two) times daily.     Marland Kitchen ezetimibe (ZETIA) 10 MG tablet Take 10 mg by mouth daily.    . fish oil-omega-3 fatty acids 1000 MG capsule Take 1 g by mouth 2 (two) times daily.     . Fluticasone-Salmeterol (ADVAIR) 250-50 MCG/DOSE AEPB Inhale 1 puff into the lungs every 12 (twelve) hours.     . folic acid (FOLVITE) 235 MCG tablet Take 400 mcg by mouth daily.    Marland Kitchen guanFACINE (TENEX) 1 MG tablet Take 1 mg by mouth at bedtime.      . multivitamin-iron-minerals-folic acid (CENTRUM) chewable tablet Chew 1 tablet by mouth daily.    Marland Kitchen omeprazole (PRILOSEC) 40 MG capsule Take 40 mg by mouth daily.    . potassium chloride (K-DUR) 10 MEQ tablet Take 10 mEq by mouth at bedtime.    Marland Kitchen albuterol (PROVENTIL) (2.5 MG/3ML) 0.083% nebulizer solution Take 2.5 mg by nebulization daily as needed for wheezing or shortness of breath.      No current facility-administered medications on file prior to visit.         Objective:   Physical Exam Blood pressure 118/72, pulse 64, temperature (!) 97.5 F (36.4 C), resp. rate 14, height 5' 3" (1.6 m), weight 121 lb 9.6 oz (55.2 kg).  Alert and oriented. Skin warm and dry. Oral mucosa is moist.   . Sclera anicteric, conjunctivae is pink. Thyroid not enlarged. No cervical lymphadenopathy. Lungs clear. Heart regular rate and rhythm.  Abdomen is soft. Bowel sounds are positive. No hepatomegaly. No abdominal masses felt. No tenderness.  No edema to lower extremities.         Assessment & Plan:  Dysphagia. No dysphagia. She can eat most anything she wants.  She is doing well. Will see back in one year.

## 2017-05-10 NOTE — Patient Instructions (Signed)
OV in 1 year. Continue the [employer]

## 2017-05-11 ENCOUNTER — Encounter (INDEPENDENT_AMBULATORY_CARE_PROVIDER_SITE_OTHER): Payer: Self-pay

## 2017-05-24 DIAGNOSIS — H40043 Steroid responder, bilateral: Secondary | ICD-10-CM | POA: Diagnosis not present

## 2017-07-02 DIAGNOSIS — F329 Major depressive disorder, single episode, unspecified: Secondary | ICD-10-CM | POA: Diagnosis not present

## 2017-07-02 DIAGNOSIS — F419 Anxiety disorder, unspecified: Secondary | ICD-10-CM | POA: Diagnosis not present

## 2017-07-02 DIAGNOSIS — J45909 Unspecified asthma, uncomplicated: Secondary | ICD-10-CM | POA: Diagnosis not present

## 2017-07-02 DIAGNOSIS — I10 Essential (primary) hypertension: Secondary | ICD-10-CM | POA: Diagnosis not present

## 2017-07-18 DIAGNOSIS — Z947 Corneal transplant status: Secondary | ICD-10-CM | POA: Diagnosis not present

## 2017-07-30 DIAGNOSIS — H40043 Steroid responder, bilateral: Secondary | ICD-10-CM | POA: Diagnosis not present

## 2017-07-30 DIAGNOSIS — H40053 Ocular hypertension, bilateral: Secondary | ICD-10-CM | POA: Diagnosis not present

## 2017-08-13 DIAGNOSIS — Z23 Encounter for immunization: Secondary | ICD-10-CM | POA: Diagnosis not present

## 2017-10-25 DIAGNOSIS — H04123 Dry eye syndrome of bilateral lacrimal glands: Secondary | ICD-10-CM | POA: Diagnosis not present

## 2017-10-25 DIAGNOSIS — Z947 Corneal transplant status: Secondary | ICD-10-CM | POA: Diagnosis not present

## 2017-10-25 DIAGNOSIS — H40043 Steroid responder, bilateral: Secondary | ICD-10-CM | POA: Diagnosis not present

## 2017-11-07 DIAGNOSIS — I10 Essential (primary) hypertension: Secondary | ICD-10-CM | POA: Diagnosis not present

## 2017-11-07 DIAGNOSIS — J45901 Unspecified asthma with (acute) exacerbation: Secondary | ICD-10-CM | POA: Diagnosis not present

## 2017-11-07 DIAGNOSIS — F321 Major depressive disorder, single episode, moderate: Secondary | ICD-10-CM | POA: Diagnosis not present

## 2017-11-07 DIAGNOSIS — F419 Anxiety disorder, unspecified: Secondary | ICD-10-CM | POA: Diagnosis not present

## 2017-11-19 DIAGNOSIS — H04123 Dry eye syndrome of bilateral lacrimal glands: Secondary | ICD-10-CM | POA: Diagnosis not present

## 2017-11-19 DIAGNOSIS — Z947 Corneal transplant status: Secondary | ICD-10-CM | POA: Diagnosis not present

## 2017-11-19 DIAGNOSIS — H40043 Steroid responder, bilateral: Secondary | ICD-10-CM | POA: Diagnosis not present

## 2017-11-30 DIAGNOSIS — H16222 Keratoconjunctivitis sicca, not specified as Sjogren's, left eye: Secondary | ICD-10-CM | POA: Diagnosis not present

## 2017-11-30 DIAGNOSIS — H04122 Dry eye syndrome of left lacrimal gland: Secondary | ICD-10-CM | POA: Diagnosis not present

## 2018-01-11 DIAGNOSIS — H43812 Vitreous degeneration, left eye: Secondary | ICD-10-CM | POA: Diagnosis not present

## 2018-01-25 DIAGNOSIS — H1131 Conjunctival hemorrhage, right eye: Secondary | ICD-10-CM | POA: Diagnosis not present

## 2018-01-25 DIAGNOSIS — H4063X0 Glaucoma secondary to drugs, bilateral, stage unspecified: Secondary | ICD-10-CM | POA: Diagnosis not present

## 2018-01-25 DIAGNOSIS — H02422 Myogenic ptosis of left eyelid: Secondary | ICD-10-CM | POA: Diagnosis not present

## 2018-01-31 DIAGNOSIS — H401131 Primary open-angle glaucoma, bilateral, mild stage: Secondary | ICD-10-CM | POA: Diagnosis not present

## 2018-02-06 DIAGNOSIS — H401131 Primary open-angle glaucoma, bilateral, mild stage: Secondary | ICD-10-CM | POA: Diagnosis not present

## 2018-02-19 DIAGNOSIS — J301 Allergic rhinitis due to pollen: Secondary | ICD-10-CM | POA: Diagnosis not present

## 2018-02-19 DIAGNOSIS — J329 Chronic sinusitis, unspecified: Secondary | ICD-10-CM | POA: Diagnosis not present

## 2018-02-19 DIAGNOSIS — I10 Essential (primary) hypertension: Secondary | ICD-10-CM | POA: Diagnosis not present

## 2018-02-19 DIAGNOSIS — F419 Anxiety disorder, unspecified: Secondary | ICD-10-CM | POA: Diagnosis not present

## 2018-03-13 DIAGNOSIS — H401131 Primary open-angle glaucoma, bilateral, mild stage: Secondary | ICD-10-CM | POA: Diagnosis not present

## 2018-03-19 DIAGNOSIS — H401131 Primary open-angle glaucoma, bilateral, mild stage: Secondary | ICD-10-CM | POA: Diagnosis not present

## 2018-04-22 DIAGNOSIS — I10 Essential (primary) hypertension: Secondary | ICD-10-CM | POA: Diagnosis not present

## 2018-04-22 DIAGNOSIS — K210 Gastro-esophageal reflux disease with esophagitis: Secondary | ICD-10-CM | POA: Diagnosis not present

## 2018-04-22 DIAGNOSIS — F419 Anxiety disorder, unspecified: Secondary | ICD-10-CM | POA: Diagnosis not present

## 2018-04-22 DIAGNOSIS — J449 Chronic obstructive pulmonary disease, unspecified: Secondary | ICD-10-CM | POA: Diagnosis not present

## 2018-04-24 DIAGNOSIS — H40043 Steroid responder, bilateral: Secondary | ICD-10-CM | POA: Diagnosis not present

## 2018-05-08 ENCOUNTER — Ambulatory Visit (INDEPENDENT_AMBULATORY_CARE_PROVIDER_SITE_OTHER): Payer: Medicare Other | Admitting: Internal Medicine

## 2018-05-09 ENCOUNTER — Ambulatory Visit (INDEPENDENT_AMBULATORY_CARE_PROVIDER_SITE_OTHER): Payer: Medicare Other | Admitting: Internal Medicine

## 2018-05-09 DIAGNOSIS — I10 Essential (primary) hypertension: Secondary | ICD-10-CM | POA: Diagnosis not present

## 2018-05-09 DIAGNOSIS — F419 Anxiety disorder, unspecified: Secondary | ICD-10-CM | POA: Diagnosis not present

## 2018-05-09 DIAGNOSIS — J329 Chronic sinusitis, unspecified: Secondary | ICD-10-CM | POA: Diagnosis not present

## 2018-05-09 DIAGNOSIS — J449 Chronic obstructive pulmonary disease, unspecified: Secondary | ICD-10-CM | POA: Diagnosis not present

## 2018-06-11 ENCOUNTER — Ambulatory Visit (INDEPENDENT_AMBULATORY_CARE_PROVIDER_SITE_OTHER): Payer: Medicare Other | Admitting: Internal Medicine

## 2018-06-11 ENCOUNTER — Encounter (INDEPENDENT_AMBULATORY_CARE_PROVIDER_SITE_OTHER): Payer: Self-pay | Admitting: Internal Medicine

## 2018-06-11 VITALS — BP 160/70 | HR 76 | Temp 97.7°F | Ht 63.0 in | Wt 123.0 lb

## 2018-06-11 DIAGNOSIS — R1319 Other dysphagia: Secondary | ICD-10-CM

## 2018-06-11 DIAGNOSIS — R1310 Dysphagia, unspecified: Secondary | ICD-10-CM

## 2018-06-11 NOTE — Progress Notes (Signed)
Subjective:    Patient ID: Stephanie West, female    DOB: Apr 29, 1941, 77 y.o.   MRN: [id number]  HPI Here today for f/u. Last seen in July of 2018. Hx of dysphagia. Last EGD/ED in 2016. Has had multiple EGDs in the past.  Hx of elevated liver enzymes. Last enzymes in 2017 were normal. She is doing good. She is the caregiver for her husband who is disabled. Her appetite is good. No weight loss. She denies any abdominal pain. She has no GI complaints.  Her BMs are normal. No melena or BRRB.   Hepatic Function Latest Ref Rng & Units 02/17/2016 02/07/2016 07/03/2015  Total Protein 6.1 - 8.1 g/dL 7.1 7.5 7.7  Albumin 3.6 - 5.1 g/dL 3.7 3.4(L) 4.1  AST 10 - 35 U/L 16 24 19   ALT 6 - 29 U/L 10 13(L) 15  Alk Phosphatase 33 - 130 U/L 62 61 58  Total Bilirubin 0.2 - 1.2 mg/dL 0.3 0.5 0.4  Bilirubin, Direct 0.0 - 0.3 mg/dL - - -     07/30/2015 EGD/ED: Intractable nausea and post prandial pain.  Impression: High-grade Schatzki's ring which was disrupted with combination of scope balloon dilation and focal biopsy. Moderate-sized sliding hiatal hernia. Nonerosive antral gastritis. Biopsy taken.     06/29/2004: Total colonoscopy.  INDICATIONS: Amamda is a 77 year old Caucasian female who is here primarily for screening exam. This is prompted by change in bowel habits, her constipation resulting from verapamil which has been discontinued, and she is back to having bowel movement daily or every other day. Family history is negative for colorectal carcinoma. Procedure and risks were reviewed with the patient and informed consent was obtained. 1. Pan colic diverticulosis. She has a few scattered diverticula mainly at  the sigmoid colon and some proximal to splenic flexure. 2. Small external hemorrhoids. 3. Mild melanosis coli.  Review of Systems  Past Medical History:  Diagnosis Date  . Asthma   . Dysphagia 05/10/2017  . Elevated transaminase level   . GERD  (gastroesophageal reflux disease)   . Hypertension   . MVP (mitral valve prolapse)     Past Surgical History:  Procedure Laterality Date  . COLONOSCOPY    . ESOPHAGEAL DILATION  07/30/2015   Procedure: ESOPHAGEAL DILATION;  Surgeon: Rogene Houston, MD;  Location: [facility];  Service: Endoscopy;;  . ESOPHAGOGASTRODUODENOSCOPY N/A 04/15/2015   Procedure: ESOPHAGOGASTRODUODENOSCOPY (EGD);  Surgeon: Rogene Houston, MD;  Location: [facility];  Service: Endoscopy;  Laterality: N/A;  125 - moved to 1:00, Ann to notify pt  . ESOPHAGOGASTRODUODENOSCOPY N/A 07/30/2015   Procedure: ESOPHAGOGASTRODUODENOSCOPY (EGD);  Surgeon: Rogene Houston, MD;  Location: [facility];  Service: Endoscopy;  Laterality: N/A;  1040  . ESOPHAGOGASTRODUODENOSCOPY (EGD) WITH ESOPHAGEAL DILATION N/A 03/07/2013   Procedure: ESOPHAGOGASTRODUODENOSCOPY (EGD) WITH ESOPHAGEAL DILATION;  Surgeon: Rogene Houston, MD;  Location: [facility];  Service: Endoscopy;  Laterality: N/A;  830  . LAPAROSCOPIC TUBAL LIGATION  1984  . UPPER GASTROINTESTINAL ENDOSCOPY      Allergies  Allergen Reactions  . Apple Other (See Comments)    Wheezing and Asthma Symptoms   . Aspirin Other (See Comments)    Wheezing, Asthma Symptoms   . Avelox [Moxifloxacin Hcl In Nacl] Hives    Pt can take cipro  . Bee Venom Other (See Comments)    Wheezing, Asthma Symptoms  . Benadryl [Diphenhydramine Hcl] Other (See Comments)    Wheezing, Asthma Symptoms   . Chicken Allergy  Other (See Comments)    Wheezing and Asthma Symptoms   . Codeine Other (See Comments)    Makes patient feel like she's going to pass out.   Haze Boyden [Gemifloxacin Mesylate] Hives  . Factive [Gemifloxacin] Hives  . Iodine   . Levaquin [Levofloxacin In D5w] Hives    Pt can take cipro  . Levofloxacin Hives  . Penicillins Other (See Comments)    Asthma Symptoms  . Sulfa Antibiotics Nausea Only  . Tequin [Gatifloxacin] Hives    Current Outpatient Medications on File  Prior to Visit  Medication Sig Dispense Refill  . albuterol (PROVENTIL HFA;VENTOLIN HFA) 108 (90 BASE) MCG/ACT inhaler Inhale 2 puffs into the lungs every 6 (six) hours as needed for wheezing or shortness of breath.    Marland Kitchen aliskiren (TEKTURNA) 150 MG tablet Take 150 mg by mouth 2 (two) times daily.     . brimonidine (ALPHAGAN) 0.2 % ophthalmic solution     . Cyanocobalamin (VITAMIN B 12 PO) Take 2,000 mcg by mouth daily.    Marland Kitchen diltiazem (CARDIZEM CD) 180 MG 24 hr capsule Take 180 mg by mouth 2 (two) times daily.     Marland Kitchen ezetimibe (ZETIA) 10 MG tablet Take 10 mg by mouth daily.    . fish oil-omega-3 fatty acids 1000 MG capsule Take 1 g by mouth 2 (two) times daily.     . Fluticasone-Salmeterol (ADVAIR) 250-50 MCG/DOSE AEPB Inhale 1 puff into the lungs every 12 (twelve) hours.     . folic acid (FOLVITE) 894 MCG tablet Take 400 mcg by mouth daily.    Marland Kitchen guanFACINE (TENEX) 1 MG tablet Take 1 mg by mouth at bedtime.      Marland Kitchen loteprednol (LOTEMAX) 0.2 % SUSP 1 drop 4 (four) times daily.    . multivitamin-iron-minerals-folic acid (CENTRUM) chewable tablet Chew 1 tablet by mouth daily.    Marland Kitchen omeprazole (PRILOSEC) 40 MG capsule Take 40 mg by mouth daily.    . potassium chloride (K-DUR) 10 MEQ tablet Take 10 mEq by mouth at bedtime.     No current facility-administered medications on file prior to visit.         Objective:   Physical Exam Blood pressure (!) 160/70, pulse 76, temperature 97.7 F (36.5 C), height 5' 3"  (1.6 m), weight 123 lb (55.8 kg). Alert and oriented. Skin warm and dry. Oral mucosa is moist.   . Sclera anicteric, conjunctivae is pink. Thyroid not enlarged. No cervical lymphadenopathy. Lungs clear. Heart regular rate and rhythm.  Abdomen is soft. Bowel sounds are positive. No hepatomegaly. No abdominal masses felt. No tenderness.  No edema to lower extremities.           Assessment & Plan:  Dysphagia. She is doing good. She will have OV in 1 year. Continue the Omeprazole.

## 2018-06-11 NOTE — Patient Instructions (Signed)
OV in 1 year.  

## 2018-07-02 DIAGNOSIS — H401131 Primary open-angle glaucoma, bilateral, mild stage: Secondary | ICD-10-CM | POA: Diagnosis not present

## 2018-08-14 DIAGNOSIS — Z23 Encounter for immunization: Secondary | ICD-10-CM | POA: Diagnosis not present

## 2018-09-12 DIAGNOSIS — F419 Anxiety disorder, unspecified: Secondary | ICD-10-CM | POA: Diagnosis not present

## 2018-09-12 DIAGNOSIS — J329 Chronic sinusitis, unspecified: Secondary | ICD-10-CM | POA: Diagnosis not present

## 2018-09-12 DIAGNOSIS — J449 Chronic obstructive pulmonary disease, unspecified: Secondary | ICD-10-CM | POA: Diagnosis not present

## 2018-09-12 DIAGNOSIS — I10 Essential (primary) hypertension: Secondary | ICD-10-CM | POA: Diagnosis not present

## 2018-09-13 DIAGNOSIS — I10 Essential (primary) hypertension: Secondary | ICD-10-CM | POA: Diagnosis not present

## 2018-09-13 DIAGNOSIS — F419 Anxiety disorder, unspecified: Secondary | ICD-10-CM | POA: Diagnosis not present

## 2018-09-16 DIAGNOSIS — E876 Hypokalemia: Secondary | ICD-10-CM | POA: Diagnosis not present

## 2018-09-16 DIAGNOSIS — E785 Hyperlipidemia, unspecified: Secondary | ICD-10-CM | POA: Diagnosis not present

## 2018-09-16 DIAGNOSIS — I10 Essential (primary) hypertension: Secondary | ICD-10-CM | POA: Diagnosis not present

## 2018-09-16 DIAGNOSIS — J449 Chronic obstructive pulmonary disease, unspecified: Secondary | ICD-10-CM | POA: Diagnosis not present

## 2018-09-17 ENCOUNTER — Encounter: Payer: Self-pay | Admitting: Family Medicine

## 2018-09-17 DIAGNOSIS — I10 Essential (primary) hypertension: Secondary | ICD-10-CM | POA: Diagnosis not present

## 2018-09-17 DIAGNOSIS — E785 Hyperlipidemia, unspecified: Secondary | ICD-10-CM | POA: Diagnosis not present

## 2018-09-17 DIAGNOSIS — E876 Hypokalemia: Secondary | ICD-10-CM | POA: Diagnosis not present

## 2018-09-17 DIAGNOSIS — J449 Chronic obstructive pulmonary disease, unspecified: Secondary | ICD-10-CM | POA: Diagnosis not present

## 2018-09-23 DIAGNOSIS — K219 Gastro-esophageal reflux disease without esophagitis: Secondary | ICD-10-CM | POA: Diagnosis not present

## 2018-09-23 DIAGNOSIS — F419 Anxiety disorder, unspecified: Secondary | ICD-10-CM | POA: Diagnosis not present

## 2018-09-23 DIAGNOSIS — E785 Hyperlipidemia, unspecified: Secondary | ICD-10-CM | POA: Diagnosis not present

## 2018-09-23 DIAGNOSIS — I10 Essential (primary) hypertension: Secondary | ICD-10-CM | POA: Diagnosis not present

## 2018-11-15 DIAGNOSIS — I10 Essential (primary) hypertension: Secondary | ICD-10-CM | POA: Diagnosis not present

## 2018-11-15 DIAGNOSIS — F419 Anxiety disorder, unspecified: Secondary | ICD-10-CM | POA: Diagnosis not present

## 2018-11-15 DIAGNOSIS — J449 Chronic obstructive pulmonary disease, unspecified: Secondary | ICD-10-CM | POA: Diagnosis not present

## 2018-12-04 DIAGNOSIS — H40043 Steroid responder, bilateral: Secondary | ICD-10-CM | POA: Diagnosis not present

## 2019-02-14 DIAGNOSIS — J449 Chronic obstructive pulmonary disease, unspecified: Secondary | ICD-10-CM | POA: Diagnosis not present

## 2019-02-14 DIAGNOSIS — I10 Essential (primary) hypertension: Secondary | ICD-10-CM | POA: Diagnosis not present

## 2019-02-14 DIAGNOSIS — R000 Tachycardia, unspecified: Secondary | ICD-10-CM | POA: Diagnosis not present

## 2019-02-14 DIAGNOSIS — F419 Anxiety disorder, unspecified: Secondary | ICD-10-CM | POA: Diagnosis not present

## 2019-04-24 DIAGNOSIS — I10 Essential (primary) hypertension: Secondary | ICD-10-CM | POA: Diagnosis not present

## 2019-04-24 DIAGNOSIS — J329 Chronic sinusitis, unspecified: Secondary | ICD-10-CM | POA: Diagnosis not present

## 2019-04-24 DIAGNOSIS — J449 Chronic obstructive pulmonary disease, unspecified: Secondary | ICD-10-CM | POA: Diagnosis not present

## 2019-04-24 DIAGNOSIS — F419 Anxiety disorder, unspecified: Secondary | ICD-10-CM | POA: Diagnosis not present

## 2019-04-30 DIAGNOSIS — H40043 Steroid responder, bilateral: Secondary | ICD-10-CM | POA: Diagnosis not present

## 2019-05-19 DIAGNOSIS — H4010X1 Unspecified open-angle glaucoma, mild stage: Secondary | ICD-10-CM | POA: Diagnosis not present

## 2019-05-29 DIAGNOSIS — K210 Gastro-esophageal reflux disease with esophagitis: Secondary | ICD-10-CM | POA: Diagnosis not present

## 2019-05-29 DIAGNOSIS — F419 Anxiety disorder, unspecified: Secondary | ICD-10-CM | POA: Diagnosis not present

## 2019-06-04 DIAGNOSIS — F419 Anxiety disorder, unspecified: Secondary | ICD-10-CM | POA: Diagnosis not present

## 2019-06-04 DIAGNOSIS — I10 Essential (primary) hypertension: Secondary | ICD-10-CM | POA: Diagnosis not present

## 2019-06-04 DIAGNOSIS — R109 Unspecified abdominal pain: Secondary | ICD-10-CM | POA: Diagnosis not present

## 2019-06-04 DIAGNOSIS — J449 Chronic obstructive pulmonary disease, unspecified: Secondary | ICD-10-CM | POA: Diagnosis not present

## 2019-06-11 DIAGNOSIS — J329 Chronic sinusitis, unspecified: Secondary | ICD-10-CM | POA: Diagnosis not present

## 2019-06-11 DIAGNOSIS — J301 Allergic rhinitis due to pollen: Secondary | ICD-10-CM | POA: Diagnosis not present

## 2019-06-11 DIAGNOSIS — I10 Essential (primary) hypertension: Secondary | ICD-10-CM | POA: Diagnosis not present

## 2019-06-11 DIAGNOSIS — Z23 Encounter for immunization: Secondary | ICD-10-CM | POA: Diagnosis not present

## 2019-06-11 DIAGNOSIS — J449 Chronic obstructive pulmonary disease, unspecified: Secondary | ICD-10-CM | POA: Diagnosis not present

## 2019-06-16 ENCOUNTER — Other Ambulatory Visit: Payer: Self-pay

## 2019-06-16 ENCOUNTER — Ambulatory Visit (INDEPENDENT_AMBULATORY_CARE_PROVIDER_SITE_OTHER): Payer: Medicare Other | Admitting: Nurse Practitioner

## 2019-06-16 ENCOUNTER — Encounter (INDEPENDENT_AMBULATORY_CARE_PROVIDER_SITE_OTHER): Payer: Self-pay | Admitting: Nurse Practitioner

## 2019-06-16 VITALS — BP 220/92 | HR 80 | Temp 98.3°F | Wt 116.2 lb

## 2019-06-16 DIAGNOSIS — R110 Nausea: Secondary | ICD-10-CM

## 2019-06-16 DIAGNOSIS — K219 Gastro-esophageal reflux disease without esophagitis: Secondary | ICD-10-CM

## 2019-06-16 NOTE — Progress Notes (Signed)
Subjective:    Patient ID: Stephanie West, female    DOB: [birth year], 78 y.o.   MRN: [id number]  HPI  Patient is an 78 year old female with a past medical history of hypertension, asthma, mitral valve prolapse, nausea, GERD, dysphagia and a Schatzki's ring. She presents today for her annual follow up. She developed nausea and epigastrici pain 2 weeks ago.  She saw her Dr. Luan Pulling and he changed her omeprazole to pantoprazole 40 mg once daily on 05/31/2019.  She reports feeling much better on the pantoprazole, her nausea epigastric pain have abated.  She lost 5 to 6 pounds, weight was down to 112 pounds but over the past week she has gained 4 pounds.  She is passing normal formed bowel movement once daily.  Rectal bleeding or melena.  No NSAID use.  Reports having at least 5 EGDs in the past.  Her most recent EGD was 07/30/2015 identified a high-grade Schatzki's ring which was disrupted with combination of scope balloon dilation and focal biopsy, moderate sized sliding hiatal hernia and nonerosive antral gastritis.  Biopsies were negative for H. pylori.   Past Medical History:  Diagnosis Date  . Asthma   . Dysphagia 05/10/2017  . Elevated transaminase level   . GERD (gastroesophageal reflux disease)   . Hypertension   . MVP (mitral valve prolapse)    Past Surgical History:  Procedure Laterality Date  . COLONOSCOPY    . ESOPHAGEAL DILATION  07/30/2015   Procedure: ESOPHAGEAL DILATION;  Surgeon: Rogene Houston, MD;  Location: [facility];  Service: Endoscopy;;  . ESOPHAGOGASTRODUODENOSCOPY N/A 04/15/2015   Procedure: ESOPHAGOGASTRODUODENOSCOPY (EGD);  Surgeon: Rogene Houston, MD;  [facility]: [facility];  Service: Endoscopy;  Laterality: N/A;  125 - moved to 1:00, Ann to notify pt  . ESOPHAGOGASTRODUODENOSCOPY N/A 07/30/2015   Procedure: ESOPHAGOGASTRODUODENOSCOPY (EGD);  Surgeon: Rogene Houston, MD;  [facility]: [facility];  Service: Endoscopy;  Laterality: N/A;  1040  .  ESOPHAGOGASTRODUODENOSCOPY (EGD) WITH ESOPHAGEAL DILATION N/A 03/07/2013   Procedure: ESOPHAGOGASTRODUODENOSCOPY (EGD) WITH ESOPHAGEAL DILATION;  Surgeon: Rogene Houston, MD;  Location: [facility];  Service: Endoscopy;  Laterality: N/A;  830  . LAPAROSCOPIC TUBAL LIGATION  1984  . UPPER GASTROINTESTINAL ENDOSCOPY     Current Outpatient Medications on File Prior to Visit  Medication Sig Dispense Refill  . albuterol (PROVENTIL HFA;VENTOLIN HFA) 108 (90 BASE) MCG/ACT inhaler Inhale 2 puffs into the lungs every 6 (six) hours as needed for wheezing or shortness of breath.    . Cyanocobalamin (VITAMIN B 12 PO) Take 2,000 mcg by mouth daily.    Marland Kitchen diltiazem (CARDIZEM CD) 180 MG 24 hr capsule Take 360 mg by mouth daily.     Marland Kitchen ezetimibe (ZETIA) 10 MG tablet Take 10 mg by mouth daily.    . fish oil-omega-3 fatty acids 1000 MG capsule Take 1 g by mouth 2 (two) times daily.     . Fluticasone-Salmeterol (ADVAIR) 250-50 MCG/DOSE AEPB Inhale 1 puff into the lungs every 12 (twelve) hours.     . folic acid (FOLVITE) A999333 MCG tablet Take 400 mcg by mouth daily.    Marland Kitchen guanFACINE (TENEX) 1 MG tablet Take 1 mg by mouth at bedtime.      Marland Kitchen loteprednol (LOTEMAX) 0.2 % SUSP Place 1 drop into both eyes daily.     . multivitamin-iron-minerals-folic acid (CENTRUM) chewable tablet Chew 1 tablet by mouth daily.    . pantoprazole (PROTONIX) 40 MG tablet Take 40  mg by mouth daily.    . potassium chloride (K-DUR) 10 MEQ tablet Take 10 mEq by mouth 3 (three) times daily.     Marland Kitchen aliskiren (TEKTURNA) 150 MG tablet Take 300 mg by mouth daily. PT is taking once a day    . brimonidine (ALPHAGAN) 0.2 % ophthalmic solution      No current facility-administered medications on file prior to visit.    Allergies  Allergen Reactions  . Apple Other (See Comments)    Wheezing and Asthma Symptoms   . Aspirin Other (See Comments)    Wheezing, Asthma Symptoms   . Avelox [Moxifloxacin Hcl In Nacl] Hives    Pt can take cipro  . Bee Venom  Other (See Comments)    Wheezing, Asthma Symptoms  . Benadryl [Diphenhydramine Hcl] Other (See Comments)    Wheezing, Asthma Symptoms   . Chicken Allergy Other (See Comments)    Wheezing and Asthma Symptoms   . Codeine Other (See Comments)    Makes patient feel like she's going to pass out.   Haze Boyden [Gemifloxacin Mesylate] Hives  . Factive [Gemifloxacin] Hives  . Iodine   . Levaquin [Levofloxacin In D5w] Hives    Pt can take cipro  . Levofloxacin Hives  . Penicillins Other (See Comments)    Asthma Symptoms  . Sulfa Antibiotics Nausea Only  . Tequin [Gatifloxacin] Hives     Review of Systems the HPI, all other systems reviewed and are negative     Objective:   Physical Exam  BP (!) 220/92   Pulse 80   Temp 98.3 F (36.8 C) (Oral)   Wt 116 lb 3.2 oz (52.7 kg)   BMI 20.58 kg/m  Repeat BP 202/78     Assessment & Plan:  1. GERD, stable -Continue pantoprazole 40 mg once daily  2. Nausea, resolved after Omeprazole changed to Pantoprazole 40mg  once daily  -continue Pantoprazole 40mg  once daily -call our office if your nausea recurs  3. Epigastric pain -continue Pantoprazole 40mg  once daily -call our office if your upper abdominal pain recurs   4. Colon cancer screening, patient's last colonoscopy was in 2005, no polyps, at the age of 10 the patient does not wish to pursue any noninvasive or invasive colon cancer screening   4. HTN: Manual BP 202/78 -patient to take her BP meds upon return home, he stated she will have her neighbor who is a nurse check her blood pressure later this afternoon, if SBP > 200 patient advised to go to the [facility] Emergency Room

## 2019-06-16 NOTE — Patient Instructions (Addendum)
1. Continue Pantoprazole 40mg  once daily  2. Call our office if your upper abdominal pain recurs   3. Take your blood pressure medications when you get home, in 1 hour recheck your blood pressure,  if your systolic blood pressure is greater than 200 then I advise you go to the [facility] for blood pressure management. Having a high blood pressure increases your risk for a stroke and heart attack.   At time of discharge, patient clarified she does not have a BP machine, however, she will have her neighbor who is a nurse check her blood pressure as requested above.

## 2019-06-18 DIAGNOSIS — R109 Unspecified abdominal pain: Secondary | ICD-10-CM | POA: Diagnosis not present

## 2019-06-18 DIAGNOSIS — F419 Anxiety disorder, unspecified: Secondary | ICD-10-CM | POA: Diagnosis not present

## 2019-06-18 DIAGNOSIS — I10 Essential (primary) hypertension: Secondary | ICD-10-CM | POA: Diagnosis not present

## 2019-06-18 DIAGNOSIS — K210 Gastro-esophageal reflux disease with esophagitis: Secondary | ICD-10-CM | POA: Diagnosis not present

## 2019-06-20 DIAGNOSIS — I10 Essential (primary) hypertension: Secondary | ICD-10-CM | POA: Diagnosis not present

## 2019-06-26 DIAGNOSIS — I10 Essential (primary) hypertension: Secondary | ICD-10-CM | POA: Diagnosis not present

## 2019-06-26 DIAGNOSIS — F419 Anxiety disorder, unspecified: Secondary | ICD-10-CM | POA: Diagnosis not present

## 2019-06-26 DIAGNOSIS — J329 Chronic sinusitis, unspecified: Secondary | ICD-10-CM | POA: Diagnosis not present

## 2019-06-26 DIAGNOSIS — J449 Chronic obstructive pulmonary disease, unspecified: Secondary | ICD-10-CM | POA: Diagnosis not present

## 2019-07-02 ENCOUNTER — Inpatient Hospital Stay (HOSPITAL_COMMUNITY)
Admission: EM | Admit: 2019-07-02 | Discharge: 2019-07-07 | DRG: 641 | Disposition: A | Discharge status: Home / Self Care | Payer: Medicare Other | Attending: Pulmonary Disease | Admitting: Pulmonary Disease

## 2019-07-02 ENCOUNTER — Emergency Department (HOSPITAL_COMMUNITY): Payer: Medicare Other

## 2019-07-02 ENCOUNTER — Encounter (HOSPITAL_COMMUNITY): Payer: Self-pay

## 2019-07-02 ENCOUNTER — Other Ambulatory Visit: Payer: Self-pay

## 2019-07-02 DIAGNOSIS — R110 Nausea: Secondary | ICD-10-CM

## 2019-07-02 DIAGNOSIS — K219 Gastro-esophageal reflux disease without esophagitis: Secondary | ICD-10-CM | POA: Diagnosis present

## 2019-07-02 DIAGNOSIS — E871 Hypo-osmolality and hyponatremia: Secondary | ICD-10-CM | POA: Diagnosis not present

## 2019-07-02 DIAGNOSIS — K2970 Gastritis, unspecified, without bleeding: Secondary | ICD-10-CM | POA: Diagnosis present

## 2019-07-02 DIAGNOSIS — Z20828 Contact with and (suspected) exposure to other viral communicable diseases: Secondary | ICD-10-CM | POA: Diagnosis present

## 2019-07-02 DIAGNOSIS — N183 Chronic kidney disease, stage 3 (moderate): Secondary | ICD-10-CM | POA: Diagnosis present

## 2019-07-02 DIAGNOSIS — Z888 Allergy status to other drugs, medicaments and biological substances status: Secondary | ICD-10-CM

## 2019-07-02 DIAGNOSIS — Z91018 Allergy to other foods: Secondary | ICD-10-CM | POA: Diagnosis not present

## 2019-07-02 DIAGNOSIS — Z91041 Radiographic dye allergy status: Secondary | ICD-10-CM

## 2019-07-02 DIAGNOSIS — R1011 Right upper quadrant pain: Secondary | ICD-10-CM

## 2019-07-02 DIAGNOSIS — I129 Hypertensive chronic kidney disease with stage 1 through stage 4 chronic kidney disease, or unspecified chronic kidney disease: Secondary | ICD-10-CM | POA: Diagnosis present

## 2019-07-02 DIAGNOSIS — Z885 Allergy status to narcotic agent status: Secondary | ICD-10-CM | POA: Diagnosis not present

## 2019-07-02 DIAGNOSIS — K449 Diaphragmatic hernia without obstruction or gangrene: Secondary | ICD-10-CM | POA: Diagnosis present

## 2019-07-02 DIAGNOSIS — E860 Dehydration: Secondary | ICD-10-CM | POA: Diagnosis present

## 2019-07-02 DIAGNOSIS — J449 Chronic obstructive pulmonary disease, unspecified: Secondary | ICD-10-CM | POA: Diagnosis present

## 2019-07-02 DIAGNOSIS — Z8249 Family history of ischemic heart disease and other diseases of the circulatory system: Secondary | ICD-10-CM

## 2019-07-02 DIAGNOSIS — R1084 Generalized abdominal pain: Secondary | ICD-10-CM | POA: Diagnosis not present

## 2019-07-02 DIAGNOSIS — F329 Major depressive disorder, single episode, unspecified: Secondary | ICD-10-CM | POA: Diagnosis present

## 2019-07-02 DIAGNOSIS — Z882 Allergy status to sulfonamides status: Secondary | ICD-10-CM | POA: Diagnosis not present

## 2019-07-02 DIAGNOSIS — I160 Hypertensive urgency: Secondary | ICD-10-CM | POA: Diagnosis present

## 2019-07-02 DIAGNOSIS — I10 Essential (primary) hypertension: Secondary | ICD-10-CM | POA: Diagnosis present

## 2019-07-02 DIAGNOSIS — R531 Weakness: Secondary | ICD-10-CM

## 2019-07-02 DIAGNOSIS — Z91030 Bee allergy status: Secondary | ICD-10-CM

## 2019-07-02 DIAGNOSIS — R079 Chest pain, unspecified: Secondary | ICD-10-CM | POA: Diagnosis not present

## 2019-07-02 DIAGNOSIS — Z79899 Other long term (current) drug therapy: Secondary | ICD-10-CM | POA: Diagnosis not present

## 2019-07-02 DIAGNOSIS — F411 Generalized anxiety disorder: Secondary | ICD-10-CM | POA: Diagnosis present

## 2019-07-02 DIAGNOSIS — E876 Hypokalemia: Secondary | ICD-10-CM | POA: Diagnosis not present

## 2019-07-02 DIAGNOSIS — I169 Hypertensive crisis, unspecified: Secondary | ICD-10-CM | POA: Diagnosis present

## 2019-07-02 DIAGNOSIS — Z880 Allergy status to penicillin: Secondary | ICD-10-CM

## 2019-07-02 DIAGNOSIS — R1013 Epigastric pain: Secondary | ICD-10-CM | POA: Diagnosis not present

## 2019-07-02 LAB — CBC WITH DIFFERENTIAL/PLATELET
Abs Immature Granulocytes: 0.02 10*3/uL (ref 0.00–0.07)
Basophils Absolute: 0 10*3/uL (ref 0.0–0.1)
Basophils Relative: 1 %
Eosinophils Absolute: 0.1 10*3/uL (ref 0.0–0.5)
Eosinophils Relative: 2 %
HCT: 42.3 % (ref 36.0–46.0)
Hemoglobin: 15 g/dL (ref 12.0–15.0)
Immature Granulocytes: 0 %
Lymphocytes Relative: 37 %
Lymphs Abs: 2.1 10*3/uL (ref 0.7–4.0)
MCH: 31.5 pg (ref 26.0–34.0)
MCHC: 35.5 g/dL (ref 30.0–36.0)
MCV: 88.9 fL (ref 80.0–100.0)
Monocytes Absolute: 0.6 10*3/uL (ref 0.1–1.0)
Monocytes Relative: 10 %
Neutro Abs: 2.8 10*3/uL (ref 1.7–7.7)
Neutrophils Relative %: 50 %
Platelets: 269 10*3/uL (ref 150–400)
RBC: 4.76 MIL/uL (ref 3.87–5.11)
RDW: 11.6 % (ref 11.5–15.5)
WBC: 5.7 10*3/uL (ref 4.0–10.5)
nRBC: 0 % (ref 0.0–0.2)

## 2019-07-02 LAB — URINALYSIS, ROUTINE W REFLEX MICROSCOPIC
Bacteria, UA: NONE SEEN
Bilirubin Urine: NEGATIVE
Glucose, UA: NEGATIVE mg/dL
Hgb urine dipstick: NEGATIVE
Ketones, ur: NEGATIVE mg/dL
Leukocytes,Ua: NEGATIVE
Nitrite: NEGATIVE
Protein, ur: 30 mg/dL — AB
Specific Gravity, Urine: 1.01 (ref 1.005–1.030)
pH: 7 (ref 5.0–8.0)

## 2019-07-02 LAB — COMPREHENSIVE METABOLIC PANEL
ALT: 15 U/L (ref 0–44)
AST: 17 U/L (ref 15–41)
Albumin: 4.2 g/dL (ref 3.5–5.0)
Alkaline Phosphatase: 79 U/L (ref 38–126)
Anion gap: 11 (ref 5–15)
BUN: 11 mg/dL (ref 8–23)
CO2: 23 mmol/L (ref 22–32)
Calcium: 8.8 mg/dL — ABNORMAL LOW (ref 8.9–10.3)
Chloride: 83 mmol/L — ABNORMAL LOW (ref 98–111)
Creatinine, Ser: 1.02 mg/dL — ABNORMAL HIGH (ref 0.44–1.00)
GFR calc Af Amer: >60 mL/min (ref >60)
GFR calc non Af Amer: 52 mL/min — ABNORMAL LOW (ref >60)
Glucose, Bld: 143 mg/dL — ABNORMAL HIGH (ref 70–99)
Potassium: 3.7 mmol/L (ref 3.5–5.1)
Sodium: 117 mmol/L — CL (ref 135–145)
Total Bilirubin: 0.7 mg/dL (ref 0.3–1.2)
Total Protein: 7.7 g/dL (ref 6.5–8.1)

## 2019-07-02 LAB — TROPONIN I (HIGH SENSITIVITY)
Troponin I (High Sensitivity): 4 ng/L (ref <18)
Troponin I (High Sensitivity): 4 ng/L (ref <18)

## 2019-07-02 LAB — BASIC METABOLIC PANEL
Anion gap: 7 (ref 5–15)
BUN: 8 mg/dL (ref 8–23)
CO2: 26 mmol/L (ref 22–32)
Calcium: 8.4 mg/dL — ABNORMAL LOW (ref 8.9–10.3)
Chloride: 85 mmol/L — ABNORMAL LOW (ref 98–111)
Creatinine, Ser: 0.92 mg/dL (ref 0.44–1.00)
GFR calc Af Amer: >60 mL/min (ref >60)
GFR calc non Af Amer: 59 mL/min — ABNORMAL LOW (ref >60)
Glucose, Bld: 130 mg/dL — ABNORMAL HIGH (ref 70–99)
Potassium: 3.5 mmol/L (ref 3.5–5.1)
Sodium: 118 mmol/L — CL (ref 135–145)

## 2019-07-02 LAB — SARS CORONAVIRUS 2 BY RT PCR (HOSPITAL ORDER, PERFORMED IN ~~LOC~~ HOSPITAL LAB): SARS Coronavirus 2: NEGATIVE

## 2019-07-02 LAB — OSMOLALITY: Osmolality: 251 mOsm/kg — ABNORMAL LOW (ref 275–295)

## 2019-07-02 LAB — SODIUM, URINE, RANDOM: Sodium, Ur: 108 mmol/L

## 2019-07-02 LAB — TSH: TSH: 1.979 u[IU]/mL (ref 0.350–4.500)

## 2019-07-02 LAB — LIPASE, BLOOD: Lipase: 28 U/L (ref 11–51)

## 2019-07-02 MED ORDER — IRBESARTAN 300 MG PO TABS
300.0000 mg | ORAL_TABLET | Freq: Every day | ORAL | Status: DC
  Administered 2019-07-03 – 2019-07-07 (×5): 300 mg via ORAL
  Filled 2019-07-02 (×5): qty 1

## 2019-07-02 MED ORDER — GUANFACINE HCL 1 MG PO TABS
1.0000 mg | ORAL_TABLET | Freq: Every day | ORAL | Status: DC
  Administered 2019-07-02 – 2019-07-06 (×5): 1 mg via ORAL
  Filled 2019-07-02 (×7): qty 1

## 2019-07-02 MED ORDER — ALBUTEROL SULFATE (2.5 MG/3ML) 0.083% IN NEBU: 2.5000 mg | INHALATION_SOLUTION | Freq: Four times a day (QID) | RESPIRATORY_TRACT | Status: DC | PRN

## 2019-07-02 MED ORDER — SODIUM CHLORIDE 0.9 % IV SOLN
Freq: Once | INTRAVENOUS | Status: AC
  Administered 2019-07-02: 09:00:00 via INTRAVENOUS

## 2019-07-02 MED ORDER — ACETAMINOPHEN 325 MG PO TABS
650.0000 mg | ORAL_TABLET | Freq: Four times a day (QID) | ORAL | Status: DC | PRN
  Administered 2019-07-04: 650 mg via ORAL
  Filled 2019-07-02: qty 2

## 2019-07-02 MED ORDER — ALPRAZOLAM 0.25 MG PO TABS
0.2500 mg | ORAL_TABLET | Freq: Two times a day (BID) | ORAL | Status: DC | PRN
  Administered 2019-07-02 – 2019-07-06 (×4): 0.25 mg via ORAL
  Filled 2019-07-02 (×4): qty 1

## 2019-07-02 MED ORDER — LOTEPREDNOL ETABONATE 0.2 % OP SUSP
1.0000 [drp] | Freq: Every day | OPHTHALMIC | Status: DC
  Administered 2019-07-05 – 2019-07-06 (×2): 1 [drp] via OPHTHALMIC
  Filled 2019-07-02: qty 10

## 2019-07-02 MED ORDER — MOMETASONE FURO-FORMOTEROL FUM 200-5 MCG/ACT IN AERO
2.0000 | INHALATION_SPRAY | Freq: Two times a day (BID) | RESPIRATORY_TRACT | Status: DC
  Administered 2019-07-02 – 2019-07-07 (×10): 2 via RESPIRATORY_TRACT
  Filled 2019-07-02: qty 8.8

## 2019-07-02 MED ORDER — ONDANSETRON HCL 4 MG/2ML IJ SOLN
4.0000 mg | Freq: Once | INTRAMUSCULAR | Status: AC
  Administered 2019-07-02: 4 mg via INTRAVENOUS
  Filled 2019-07-02: qty 2

## 2019-07-02 MED ORDER — HYDRALAZINE HCL 20 MG/ML IJ SOLN
10.0000 mg | INTRAMUSCULAR | Status: DC | PRN
  Administered 2019-07-02 – 2019-07-06 (×6): 10 mg via INTRAVENOUS
  Filled 2019-07-02 (×7): qty 1

## 2019-07-02 MED ORDER — METOCLOPRAMIDE HCL 5 MG/ML IJ SOLN
5.0000 mg | Freq: Four times a day (QID) | INTRAMUSCULAR | Status: AC
  Administered 2019-07-02 – 2019-07-03 (×4): 5 mg via INTRAVENOUS
  Filled 2019-07-02 (×4): qty 2

## 2019-07-02 MED ORDER — HYDRALAZINE HCL 20 MG/ML IJ SOLN
10.0000 mg | Freq: Once | INTRAMUSCULAR | Status: AC
  Administered 2019-07-02: 09:00:00 10 mg via INTRAVENOUS
  Filled 2019-07-02: qty 1

## 2019-07-02 MED ORDER — BRIMONIDINE TARTRATE 0.2 % OP SOLN: 1.0000 [drp] | Freq: Three times a day (TID) | OPHTHALMIC | Status: DC

## 2019-07-02 MED ORDER — ENOXAPARIN SODIUM 40 MG/0.4ML ~~LOC~~ SOLN
40.0000 mg | SUBCUTANEOUS | Status: DC
  Administered 2019-07-02 – 2019-07-06 (×5): 40 mg via SUBCUTANEOUS
  Filled 2019-07-02 (×5): qty 0.4

## 2019-07-02 MED ORDER — ACETAMINOPHEN 650 MG RE SUPP: 650.0000 mg | Freq: Four times a day (QID) | RECTAL | Status: DC | PRN

## 2019-07-02 MED ORDER — BOOST / RESOURCE BREEZE PO LIQD CUSTOM
1.0000 | Freq: Three times a day (TID) | ORAL | Status: DC
  Administered 2019-07-02 – 2019-07-07 (×10): 1 via ORAL

## 2019-07-02 MED ORDER — SODIUM CHLORIDE 0.9 % IV BOLUS
500.0000 mL | Freq: Once | INTRAVENOUS | Status: AC
  Administered 2019-07-02: 500 mL via INTRAVENOUS

## 2019-07-02 MED ORDER — ONDANSETRON HCL 4 MG/2ML IJ SOLN
4.0000 mg | Freq: Four times a day (QID) | INTRAMUSCULAR | Status: DC | PRN
  Administered 2019-07-02 – 2019-07-06 (×2): 4 mg via INTRAVENOUS
  Filled 2019-07-02 (×2): qty 2

## 2019-07-02 MED ORDER — ONDANSETRON HCL 4 MG PO TABS
4.0000 mg | ORAL_TABLET | Freq: Four times a day (QID) | ORAL | Status: DC | PRN
  Administered 2019-07-06: 4 mg via ORAL
  Filled 2019-07-02 (×2): qty 1

## 2019-07-02 MED ORDER — NETARSUDIL DIMESYLATE 0.02 % OP SOLN
1.0000 [drp] | Freq: Every evening | OPHTHALMIC | Status: DC
  Administered 2019-07-02 – 2019-07-05 (×4): 1 [drp] via OPHTHALMIC

## 2019-07-02 MED ORDER — PANTOPRAZOLE SODIUM 40 MG IV SOLR
40.0000 mg | Freq: Two times a day (BID) | INTRAVENOUS | Status: DC
  Administered 2019-07-02 – 2019-07-07 (×11): 40 mg via INTRAVENOUS
  Filled 2019-07-02 (×11): qty 40

## 2019-07-02 MED ORDER — SUCRALFATE 1 G PO TABS
1.0000 g | ORAL_TABLET | Freq: Four times a day (QID) | ORAL | Status: DC
  Administered 2019-07-02 (×3): 1 g via ORAL
  Filled 2019-07-02 (×3): qty 1

## 2019-07-02 MED ORDER — SODIUM CHLORIDE 0.9 % IV SOLN
INTRAVENOUS | Status: DC
  Administered 2019-07-02 – 2019-07-05 (×5): via INTRAVENOUS

## 2019-07-02 MED ORDER — ALISKIREN FUMARATE 150 MG PO TABS: 300.0000 mg | ORAL_TABLET | Freq: Every day | ORAL | Status: DC

## 2019-07-02 MED ORDER — DILTIAZEM HCL ER COATED BEADS 180 MG PO CP24
360.0000 mg | ORAL_CAPSULE | Freq: Every day | ORAL | Status: DC
  Administered 2019-07-02 – 2019-07-06 (×5): 360 mg via ORAL
  Filled 2019-07-02 (×5): qty 2

## 2019-07-02 MED ORDER — CLONIDINE HCL 0.1 MG PO TABS
0.1000 mg | ORAL_TABLET | Freq: Two times a day (BID) | ORAL | Status: DC
  Administered 2019-07-02 – 2019-07-03 (×4): 0.1 mg via ORAL
  Filled 2019-07-02 (×5): qty 1

## 2019-07-02 NOTE — ED Provider Notes (Signed)
Emergency Department Provider Note   I have reviewed the triage vital signs and the nursing notes.   HISTORY  Chief Complaint Nausea   HPI Stephanie West is a 78 y.o. female with PMH of asthma, GERD, and HTN followed by her PCP Dr. Luan Pulling, presents to the emergency department for evaluation of severe nausea with epigastric abdominal pain radiating to the right upper quadrant and chest.  Patient states that the discomfort began as more mild discomfort approximately 1 week ago.  She went to see her PCP who changed her from omeprazole to pantoprazole.  She continues to have pain and over the past 4 days has had severe nausea without vomiting or diarrhea.  She states she is only been able to eat Jell-O, pudding, toast, and even becomes nauseated with drinking water.  Her last bowel movement was 2 days prior.  She denies any fever.  No upper respiratory symptoms.  No sick contacts.    Patient feels that her pain is mostly at the very bottom of her sternum and radiates to underneath the right breast and up into the chest.  Past Medical History:  Diagnosis Date  . Asthma   . Dysphagia 05/10/2017  . Elevated transaminase level   . GERD (gastroesophageal reflux disease)   . Hypertension   . MVP (mitral valve prolapse)     Patient Active Problem List   Diagnosis Date Noted  . Acute hyponatremia 07/02/2019  . Dysphagia 05/10/2017  . Salmonella gastroenteritis 02/11/2016  . Chronic renal failure, stage 3 (moderate) ([place]) 02/10/2016  . Generalized anxiety disorder 02/10/2016  . Acute renal failure (ARF) ([place]) 02/08/2016  . Nausea without vomiting 02/08/2016  . Intractable nausea and vomiting 02/08/2016  . GERD (gastroesophageal reflux disease) 02/13/2012  . Asthma 02/13/2012  . HTN (hypertension) 02/13/2012  . Transaminitis 02/13/2012    Past Surgical History:  Procedure Laterality Date  . COLONOSCOPY    . ESOPHAGEAL DILATION  07/30/2015   Procedure: ESOPHAGEAL DILATION;   Surgeon: Rogene Houston, MD;  Location: [facility];  Service: Endoscopy;;  . ESOPHAGOGASTRODUODENOSCOPY N/A 04/15/2015   Procedure: ESOPHAGOGASTRODUODENOSCOPY (EGD);  Surgeon: Rogene Houston, MD;  Location: [facility];  Service: Endoscopy;  Laterality: N/A;  125 - moved to 1:00, Ann to notify pt  . ESOPHAGOGASTRODUODENOSCOPY N/A 07/30/2015   Procedure: ESOPHAGOGASTRODUODENOSCOPY (EGD);  Surgeon: Rogene Houston, MD;  Location: [facility];  Service: Endoscopy;  Laterality: N/A;  1040  . ESOPHAGOGASTRODUODENOSCOPY (EGD) WITH ESOPHAGEAL DILATION N/A 03/07/2013   Procedure: ESOPHAGOGASTRODUODENOSCOPY (EGD) WITH ESOPHAGEAL DILATION;  Surgeon: Rogene Houston, MD;  Location: [facility];  Service: Endoscopy;  Laterality: N/A;  830  . EYE SURGERY    . LAPAROSCOPIC TUBAL LIGATION  1984  . UPPER GASTROINTESTINAL ENDOSCOPY      Allergies Apple, Aspirin, Avelox [moxifloxacin hcl in nacl], Bee venom, Benadryl [diphenhydramine hcl], Chicken allergy, Codeine, Factive [gemifloxacin mesylate], Factive [gemifloxacin], Iodinated diagnostic agents, Iodine, Levaquin [levofloxacin in d5w], Levofloxacin, Penicillins, Sulfa antibiotics, and Tequin [gatifloxacin]  Family History  Problem Relation Age of Onset  . Hypertension Mother   . Prostate cancer Father     Social History Social History   Tobacco Use  . Smoking status: Never Smoker  . Smokeless tobacco: Never Used  Substance Use Topics  . Alcohol use: No    Alcohol/week: 0.0 standard drinks  . Drug use: No    Review of Systems  Constitutional: No fever/chills Eyes: No visual changes. ENT: No sore throat. Cardiovascular: Positive  chest pain. Respiratory: Denies shortness of breath. Gastrointestinal: Positive epigastric and RUQ abdominal pain. Severe nausea, no vomiting.  No diarrhea.  No constipation. Genitourinary: Negative for dysuria. Musculoskeletal: Negative for back pain. Skin: Negative for rash. Neurological: Negative for  headaches.  10-point ROS otherwise negative.  ____________________________________________   PHYSICAL EXAM:  VITAL SIGNS: ED Triage Vitals  Enc Vitals Group     BP 07/02/19 0747 (!) 195/75     Pulse --      Resp 07/02/19 0747 18     Temp 07/02/19 0747 98.2 F (36.8 C)     Temp Source 07/02/19 0747 Oral     SpO2 07/02/19 0747 99 %     Weight 07/02/19 0744 115 lb (52.2 kg)     Height 07/02/19 0744 5\' 3"  (1.6 m)   Constitutional: Alert and oriented. Well appearing and in no acute distress. Eyes: Conjunctivae are normal.  Head: Atraumatic. Nose: No congestion/rhinnorhea. Mouth/Throat: Mucous membranes are moist.  Neck: No stridor.  Cardiovascular: Normal rate, regular rhythm. Good peripheral circulation. Grossly normal heart sounds.   Respiratory: Normal respiratory effort.  No retractions. Lungs CTAB. Gastrointestinal: Soft with mild epigastric and RUQ tenderness to palpation. No rebound. No tenderness in the mid or lower abdomen. No distention.  Musculoskeletal: No lower extremity tenderness nor edema. Neurologic:  Normal speech and language. No gross focal neurologic deficits are appreciated.  Skin:  Skin is warm, dry and intact. No rash noted.   ____________________________________________   LABS (all labs ordered are listed, but only abnormal results are displayed)  Labs Reviewed  COMPREHENSIVE METABOLIC PANEL - Abnormal; Notable for the following components:      Result Value   Sodium 117 (*)    Chloride 83 (*)    Glucose, Bld 143 (*)    Creatinine, Ser 1.02 (*)    Calcium 8.8 (*)    GFR calc non Af Amer 52 (*)    All other components within normal limits  URINALYSIS, ROUTINE W REFLEX MICROSCOPIC - Abnormal; Notable for the following components:   APPearance HAZY (*)    Protein, ur 30 (*)    All other components within normal limits  BASIC METABOLIC PANEL - Abnormal; Notable for the following components:   Sodium 118 (*)    Chloride 85 (*)    Glucose, Bld  130 (*)    Calcium 8.4 (*)    GFR calc non Af Amer 59 (*)    All other components within normal limits  OSMOLALITY - Abnormal; Notable for the following components:   Osmolality 251 (*)    All other components within normal limits  SARS CORONAVIRUS 2 (HOSPITAL ORDER, [facility])  LIPASE, BLOOD  CBC WITH DIFFERENTIAL/PLATELET  TSH  OSMOLALITY, URINE  SODIUM, URINE, RANDOM  MAGNESIUM  COMPREHENSIVE METABOLIC PANEL  CBC  TROPONIN I (HIGH SENSITIVITY)  TROPONIN I (HIGH SENSITIVITY)   ____________________________________________  EKG   EKG Interpretation  Date/Time:  Wednesday July 02 2019 07:47:32 EDT Ventricular Rate:  70 PR Interval:    QRS Duration: 104 QT Interval:  427 QTC Calculation: 461 R Axis:   69 Text Interpretation:  Sinus rhythm Abnormal R-wave progression, early transition No STEMI  Confirmed by Nanda Quinton [phone]) on 07/02/2019 8:08:20 AM       ____________________________________________  RADIOLOGY  Dg Chest 2 View  Result Date: 07/02/2019 CLINICAL DATA:  Chest pain EXAM: CHEST - 2 VIEW COMPARISON:  10/25/2013, 03/14/2011 FINDINGS: Stable cardiomediastinal contours including prominence of the right  hilar region, likely reflecting prominent vascularity seen on prior studies. Calcific aortic knob. Large retrocardiac hiatal hernia. No focal airspace consolidation. No pleural effusion. No pneumothorax. No acute osseous findings. IMPRESSION: 1. No acute cardiopulmonary findings. 2. Large hiatal hernia. Electronically Signed   By: Davina Poke M.D.   On: 07/02/2019 08:37   US Abdomen Limited Ruq  Result Date: 07/02/2019 CLINICAL DATA:  Right upper quadrant abdominal pain. EXAM: ULTRASOUND ABDOMEN LIMITED RIGHT UPPER QUADRANT COMPARISON:  CT scan of August 04, 2015. Ultrasound of July 08, 2015. FINDINGS: Gallbladder: No gallstones or wall thickening visualized. No sonographic Murphy sign noted by sonographer. Common bile duct:  Diameter: 5 mm which is within normal limits. Liver: No focal lesion identified. Within normal limits in parenchymal echogenicity. Portal vein is patent on color Doppler imaging with normal direction of blood flow towards the liver. Other: None. IMPRESSION: No abnormality seen in the right upper quadrant of the abdomen. Electronically Signed   By: Marijo Conception M.D.   On: 07/02/2019 08:58    ____________________________________________   PROCEDURES  Procedure(s) performed:   Procedures  CRITICAL CARE Performed by: Margette Fast Total critical care time: 35 minutes Critical care time was exclusive of separately billable procedures and treating other patients. Critical care was necessary to treat or prevent imminent or life-threatening deterioration. Critical care was time spent personally by me on the following activities: development of treatment plan with patient and/or surrogate as well as nursing, discussions with consultants, evaluation of patient's response to treatment, examination of patient, obtaining history from patient or surrogate, ordering and performing treatments and interventions, ordering and review of laboratory studies, ordering and review of radiographic studies, pulse oximetry and re-evaluation of patient's condition.  Nanda Quinton, MD Emergency Medicine  ____________________________________________   INITIAL IMPRESSION / ASSESSMENT AND PLAN / ED COURSE  Pertinent labs & imaging results that were available during my care of the patient were reviewed by me and considered in my medical decision making (see chart for details).   Patient presents to the emergency department for evaluation of severe nausea with mostly epigastric and right upper quadrant abdominal pain.  She has tenderness in this area as well.  Lower suspicion for ACS/PE.  Her pain seems to radiate from the epigastric region and is reproducible to palpation.  She is afebrile. EKG with no acute ischemic  changes. Labs pending. Will obtain plain film of the chest along with RUQ [place].   RUQ [place] and labs reviewed. CXR negative. Na is low which is likely the cause of the patient's symptoms. NS infusion started with patient appearing volume contracted clinically. Plan for admit for IVF and sodium trending.   Discussed patient's case with [facility], Dr. Manuella Ghazi to request admission. Patient and family (if present) updated with plan. Care transferred to [facility] service.  I reviewed all nursing notes, vitals, pertinent old records, EKGs, labs, imaging (as available).  ____________________________________________  FINAL CLINICAL IMPRESSION(S) / ED DIAGNOSES  Final diagnoses:  RUQ abdominal pain  Hyponatremia  Nausea  Generalized weakness     MEDICATIONS GIVEN DURING THIS VISIT:  Medications  cloNIDine (CATAPRES) tablet 0.1 mg (0.1 mg Oral Given 07/02/19 1427)  diltiazem (CARDIZEM CD) 24 hr capsule 360 mg (has no administration in time range)  guanFACINE (TENEX) tablet 1 mg (has no administration in time range)  irbesartan (AVAPRO) tablet 300 mg (has no administration in time range)  ALPRAZolam (XANAX) tablet 0.25 mg (0.25 mg Oral Given 07/02/19 1426)  sucralfate (CARAFATE) tablet 1 g (  1 g Oral Given 07/02/19 1815)  mometasone-formoterol (DULERA) 200-5 MCG/ACT inhaler 2 puff (2 puffs Inhalation Not Given 07/02/19 1409)  loteprednol (LOTEMAX) 0.2 % ophthalmic suspension 1 drop (has no administration in time range)  Netarsudil Dimesylate 0.02 % SOLN 1 drop (1 drop Both Eyes Given 07/02/19 1816)  enoxaparin (LOVENOX) injection 40 mg (40 mg Subcutaneous Given 07/02/19 1154)  0.9 %  sodium chloride infusion ( Intravenous Rate/Dose Change 07/02/19 1509)  acetaminophen (TYLENOL) tablet 650 mg (has no administration in time range)    Or  acetaminophen (TYLENOL) suppository 650 mg (has no administration in time range)  ondansetron (ZOFRAN) tablet 4 mg ( Oral See Alternative 07/02/19 1426)    Or  ondansetron (ZOFRAN) injection 4  mg (4 mg Intravenous Given 07/02/19 1426)  metoCLOPramide (REGLAN) injection 5 mg (5 mg Intravenous Given 07/02/19 1815)  hydrALAZINE (APRESOLINE) injection 10 mg (10 mg Intravenous Given 07/02/19 1426)  pantoprazole (PROTONIX) injection 40 mg (40 mg Intravenous Given 07/02/19 1426)  albuterol (PROVENTIL) (2.5 MG/3ML) 0.083% nebulizer solution 2.5 mg (has no administration in time range)  feeding supplement (BOOST / RESOURCE BREEZE) liquid 1 Container (1 Container Oral Given 07/02/19 1428)  ondansetron (ZOFRAN) injection 4 mg (4 mg Intravenous Given 07/02/19 0832)  hydrALAZINE (APRESOLINE) injection 10 mg (10 mg Intravenous Given 07/02/19 0832)  sodium chloride 0.9 % bolus 500 mL (0 mLs Intravenous Stopped 07/02/19 1145)  0.9 %  sodium chloride infusion ( Intravenous Stopped 07/02/19 1415)    Note:  This document was prepared using Dragon voice recognition software and may include unintentional dictation errors.  Nanda Quinton, MD Emergency Medicine    Long, [facility], MD 07/02/19 [phone]

## 2019-07-02 NOTE — H&P (Addendum)
History and Physical    Stephanie West [id number] DOB: April 11, 1941 DOA: 07/02/2019  PCP: Sinda Du, MD   Patient coming from: Home  Chief Complaint: Epigastric abdominal pain, nausea  HPI: Stephanie West is a 78 y.o. female with medical history significant for hypertension, GERD, asthma, diverticulosis, sliding hiatal hernia with high-grade Schatzki ring noted 07/2015, and antral gastritis who presented to the ED for evaluation of epigastric abdominal pain as well as severe nausea that started approximately 1 week ago.  She was seen by her PCP as well as GI 1 to 2 weeks ago when she initially had some nausea and was changed from omeprazole to pantoprazole daily.  She did have some initial improvement from this, however her symptoms became acutely worse 4 days ago and she has not tolerated much of anything by mouth over the last 2 days.  She denies any emesis or diarrhea and states her last bowel movement was 2 days ago and was formed.  She has, however been taking her medications as prescribed including her hydrochlorothiazide on a daily basis.  No fever or chills or upper respiratory symptoms described.  She denies any sick contacts.  She has been having some ongoing weakness over the last several days.   ED Course: Vital signs are stable and patient is afebrile.  She is noted to have some elevated blood pressure readings and was given hydralazine in the ED.  Laboratory data remarkable for sodium of 117 noted.  Ultrasound of the right upper quadrant negative for any acute findings and chest x-ray with no acute findings aside from large hiatal hernia which has previously been present.  COVID testing is negative.  She has been given 500 normal saline IV bolus and is on high rate of IV normal saline fluid.  Review of Systems: All others reviewed and otherwise negative.  Past Medical History:  Diagnosis Date  . Asthma   . Dysphagia 05/10/2017  . Elevated transaminase level   . GERD  (gastroesophageal reflux disease)   . Hypertension   . MVP (mitral valve prolapse)     Past Surgical History:  Procedure Laterality Date  . COLONOSCOPY    . ESOPHAGEAL DILATION  07/30/2015   Procedure: ESOPHAGEAL DILATION;  Surgeon: Rogene Houston, MD;  Location: [facility];  Service: Endoscopy;;  . ESOPHAGOGASTRODUODENOSCOPY N/A 04/15/2015   Procedure: ESOPHAGOGASTRODUODENOSCOPY (EGD);  Surgeon: Rogene Houston, MD;  Location: [facility];  Service: Endoscopy;  Laterality: N/A;  125 - moved to 1:00, Ann to notify pt  . ESOPHAGOGASTRODUODENOSCOPY N/A 07/30/2015   Procedure: ESOPHAGOGASTRODUODENOSCOPY (EGD);  Surgeon: Rogene Houston, MD;  Location: [facility];  Service: Endoscopy;  Laterality: N/A;  1040  . ESOPHAGOGASTRODUODENOSCOPY (EGD) WITH ESOPHAGEAL DILATION N/A 03/07/2013   Procedure: ESOPHAGOGASTRODUODENOSCOPY (EGD) WITH ESOPHAGEAL DILATION;  Surgeon: Rogene Houston, MD;  Location: [facility];  Service: Endoscopy;  Laterality: N/A;  830  . EYE SURGERY    . LAPAROSCOPIC TUBAL LIGATION  1984  . UPPER GASTROINTESTINAL ENDOSCOPY       reports that she has never smoked. She has never used smokeless tobacco. She reports that she does not drink alcohol or use drugs.  Allergies  Allergen Reactions  . Apple Other (See Comments)    Wheezing and Asthma Symptoms   . Aspirin Other (See Comments)    Wheezing, Asthma Symptoms   . Avelox [Moxifloxacin Hcl In Nacl] Hives    Pt can take cipro  . Bee Venom Other (See Comments)  Wheezing, Asthma Symptoms  . Benadryl [Diphenhydramine Hcl] Other (See Comments)    Wheezing, Asthma Symptoms   . Chicken Allergy Other (See Comments)    Wheezing and Asthma Symptoms   . Codeine Other (See Comments)    Makes patient feel like she's going to pass out.   Haze Boyden [Gemifloxacin Mesylate] Hives  . Factive [Gemifloxacin] Hives  . Iodinated Diagnostic Agents Other (See Comments)    Pt was told by allergist to avoid  Dye due to other  allergies  . Iodine   . Levaquin [Levofloxacin In D5w] Hives    Pt can take cipro  . Levofloxacin Hives  . Penicillins Other (See Comments)    Asthma Symptoms  . Sulfa Antibiotics Nausea Only  . Tequin [Gatifloxacin] Hives    Family History  Problem Relation Age of Onset  . Hypertension Mother   . Prostate cancer Father     Prior to Admission medications   Medication Sig Start Date End Date Taking? Authorizing Provider  albuterol (PROVENTIL HFA;VENTOLIN HFA) 108 (90 BASE) MCG/ACT inhaler Inhale 2 puffs into the lungs every 6 (six) hours as needed for wheezing or shortness of breath.    [provider]  aliskiren (TEKTURNA) 150 MG tablet Take 300 mg by mouth daily. PT is taking once a day    [provider]  ALPRAZolam (XANAX) 0.25 MG tablet Take 1 tablet by mouth 2 (two) times daily as needed. 04/30/19   [provider]  brimonidine (ALPHAGAN) 0.2 % ophthalmic solution  05/07/17   [provider]  ciprofloxacin (CIPRO) 500 MG tablet Take 1 tablet by mouth 2 (two) times daily. 06/26/19   [provider]  cloNIDine (CATAPRES) 0.1 MG tablet Take 1 tablet by mouth 2 (two) times daily. 06/20/19   [provider]  Cyanocobalamin (VITAMIN B 12 PO) Take 2,000 mcg by mouth daily.    [provider]  diltiazem (CARDIZEM CD) 180 MG 24 hr capsule Take 360 mg by mouth daily.     [provider]  ezetimibe (ZETIA) 10 MG tablet Take 10 mg by mouth daily.    [provider]  fish oil-omega-3 fatty acids 1000 MG capsule Take 1 g by mouth 2 (two) times daily.     [provider]  Fluticasone-Salmeterol (ADVAIR) 250-50 MCG/DOSE AEPB Inhale 1 puff into the lungs every 12 (twelve) hours.     [provider]  folic acid (FOLVITE) A999333 MCG tablet Take 400 mcg by mouth daily.    [provider]  guanFACINE (TENEX) 1 MG tablet Take 1 mg by mouth at bedtime.      [provider]  hydrochlorothiazide  (MICROZIDE) 12.5 MG capsule Take 1 capsule by mouth daily. 05/08/19   [provider]  loteprednol (LOTEMAX) 0.2 % SUSP Place 1 drop into both eyes daily.     [provider]  multivitamin-iron-minerals-folic acid (CENTRUM) chewable tablet Chew 1 tablet by mouth daily.    [provider]  Netarsudil Dimesylate 0.02 % SOLN Place 1 drop into both eyes every evening.    [provider]  olmesartan (BENICAR) 40 MG tablet Take 1 tablet by mouth daily. 06/18/19   [provider]  pantoprazole (PROTONIX) 40 MG tablet Take 40 mg by mouth daily.    [provider]  potassium chloride (K-DUR) 10 MEQ tablet Take 10 mEq by mouth 3 (three) times daily.     [provider]  sucralfate (CARAFATE) 1 g tablet Take 1 tablet by  mouth 4 (four) times daily. 06/16/19   [provider]    Physical Exam: Vitals:   07/02/19 0800 07/02/19 0830 07/02/19 0900 07/02/19 1000  BP: (!) 204/64 (!) 208/76 (!) 183/48 (!) 185/63  Pulse: 67 67 69 63  Resp: 17 16 16 16   Temp:      TempSrc:      SpO2: 99% 99% 98% 98%  Weight:      Height:        Constitutional: NAD, calm, comfortable Vitals:   07/02/19 0800 07/02/19 0830 07/02/19 0900 07/02/19 1000  BP: (!) 204/64 (!) 208/76 (!) 183/48 (!) 185/63  Pulse: 67 67 69 63  Resp: 17 16 16 16   Temp:      TempSrc:      SpO2: 99% 99% 98% 98%  Weight:      Height:       Eyes: lids and conjunctivae normal ENMT: Mucous membranes are moist.  Neck: normal, supple Respiratory: clear to auscultation bilaterally. Normal respiratory effort. No accessory muscle use.  Currently on room air Cardiovascular: Regular rate and rhythm, no murmurs. No extremity edema. Abdomen: Minimal epigastric tenderness, no distention. Bowel sounds positive.  Musculoskeletal:  No joint deformity upper and lower extremities.   Skin: no rashes, lesions, ulcers.  Psychiatric: Normal judgment and insight. Alert and oriented x 3. Normal  mood.   Labs on Admission: I have personally reviewed following labs and imaging studies  CBC: Recent Labs  Lab 07/02/19 0800  WBC 5.7  NEUTROABS 2.8  HGB 15.0  HCT 42.3  MCV 88.9  PLT [id number]   Basic Metabolic Panel: Recent Labs  Lab 07/02/19 0800  NA 117*  K 3.7  CL 83*  CO2 23  GLUCOSE 143*  BUN 11  CREATININE 1.02*  CALCIUM 8.8*   GFR: Estimated Creatinine Clearance: 36.3 mL/min (A) (by C-G formula based on SCr of 1.02 mg/dL (H)). Liver Function Tests: Recent Labs  Lab 07/02/19 0800  AST 17  ALT 15  ALKPHOS 79  BILITOT 0.7  PROT 7.7  ALBUMIN 4.2   Recent Labs  Lab 07/02/19 0800  LIPASE 28   No results for input(s): AMMONIA in the last 168 hours. Coagulation Profile: No results for input(s): INR, PROTIME in the last 168 hours. Cardiac Enzymes: No results for input(s): CKTOTAL, CKMB, CKMBINDEX, TROPONINI in the last 168 hours. BNP (last 3 results) No results for input(s): PROBNP in the last 8760 hours. HbA1C: No results for input(s): HGBA1C in the last 72 hours. CBG: No results for input(s): GLUCAP in the last 168 hours. Lipid Profile: No results for input(s): CHOL, HDL, LDLCALC, TRIG, CHOLHDL, LDLDIRECT in the last 72 hours. Thyroid Function Tests: No results for input(s): TSH, T4TOTAL, FREET4, T3FREE, THYROIDAB in the last 72 hours. Anemia Panel: No results for input(s): VITAMINB12, FOLATE, FERRITIN, TIBC, IRON, RETICCTPCT in the last 72 hours. Urine analysis:    Component Value Date/Time   COLORURINE YELLOW 07/02/2019 0800   APPEARANCEUR HAZY (A) 07/02/2019 0800   LABSPEC 1.010 07/02/2019 0800   PHURINE 7.0 07/02/2019 0800   GLUCOSEU NEGATIVE 07/02/2019 0800   HGBUR NEGATIVE 07/02/2019 0800   BILIRUBINUR NEGATIVE 07/02/2019 0800   KETONESUR NEGATIVE 07/02/2019 0800   PROTEINUR 30 (A) 07/02/2019 0800   UROBILINOGEN 0.2 07/03/2015 2125   NITRITE NEGATIVE 07/02/2019 0800   LEUKOCYTESUR NEGATIVE 07/02/2019 0800    Radiological Exams on  Admission: Dg Chest 2 View  Result Date: 07/02/2019 CLINICAL DATA:  Chest pain EXAM: CHEST - 2 VIEW COMPARISON:  10/25/2013, 03/14/2011 FINDINGS: Stable cardiomediastinal contours including prominence of the right hilar region, likely reflecting prominent vascularity seen on prior studies. Calcific aortic knob. Large retrocardiac hiatal hernia. No focal airspace consolidation. No pleural effusion. No pneumothorax. No acute osseous findings. IMPRESSION: 1. No acute cardiopulmonary findings. 2. Large hiatal hernia. Electronically Signed   By: Davina Poke M.D.   On: 07/02/2019 08:37   US Abdomen Limited Ruq  Result Date: 07/02/2019 CLINICAL DATA:  Right upper quadrant abdominal pain. EXAM: ULTRASOUND ABDOMEN LIMITED RIGHT UPPER QUADRANT COMPARISON:  CT scan of August 04, 2015. Ultrasound of July 08, 2015. FINDINGS: Gallbladder: No gallstones or wall thickening visualized. No sonographic Murphy sign noted by sonographer. Common bile duct: Diameter: 5 mm which is within normal limits. Liver: No focal lesion identified. Within normal limits in parenchymal echogenicity. Portal vein is patent on color Doppler imaging with normal direction of blood flow towards the liver. Other: None. IMPRESSION: No abnormality seen in the right upper quadrant of the abdomen. Electronically Signed   By: Marijo Conception M.D.   On: 07/02/2019 08:58    EKG: Independently reviewed. SR 70bpm.  Assessment/Plan Active Problems:   Acute hyponatremia    Acute symptomatic hyponatremia-multifactorial -Likely secondary to poor oral intake in the face of ongoing symptomatology with concurrent HCTZ use -Hold HCTZ -Agree with IV normal saline and repeat BMP this afternoon -Check TSH, urine and serum osmolarity to complete work-up -Repeat am labs  Epigastric abdominal pain with nausea -Noted to previously have high-grade Schatzki ring with sliding hiatal hernia and antral gastritis which could all be contributing along with  hyponatremia -We will plan to maintain on Zofran as needed for symptomatic control -Previously responded well to Reglan 5 mg IV every 6 hours for 4 doses, which I will start now -PPI IV twice daily to help with gastritis symptoms -Consider CT abdomen with contrast to reevaluate versus GI consultation if no improvement noted -Clear liquid diet for now and advance as tolerated  Hypertension -Resume home blood pressure medications -Appears to possibly have poor control even outpatient -Hydralazine IV as needed for severe elevations  GERD -PPI IV twice daily while inpatient -Continue home carafate  History of asthma -No acute bronchospasms currently noted -Breathing treatments as needed for shortness of breath or wheezing   DVT prophylaxis: Lovenox Code Status: Full Family Communication: Notified daughter Trixie Rude Disposition Plan:Admit for treatment of nausea/hyponatremia Consults called:None Admission status: Inpatient, Tele   Ranbir Chew Darleen Crocker DO [facility] Pager [phone]  If 7PM-7AM, please contact night-coverage [email or web address] Password [facility]  07/02/2019, 10:43 AM

## 2019-07-02 NOTE — ED Notes (Signed)
Pt to xray

## 2019-07-02 NOTE — ED Triage Notes (Signed)
Pt reports severe nausea x 4 days.  Reports "faint" pain under r breast, generalized weakness, and dizziness.  Denies any vomiting or diarrhea.  Denies any urinary symptoms.  LBM was 2 days ago and was normal per pt.

## 2019-07-02 NOTE — Progress Notes (Addendum)
CRITICAL VALUE ALERT  Critical Value:  Na 118  Date & Time Notied:  07/02/19 @ 1505  Provider Notified: Manuella Ghazi, MD  Orders Received/Actions taken: Increase IV fluids. Continue current treatments.

## 2019-07-03 LAB — CBC
HCT: 35.6 % — ABNORMAL LOW (ref 36.0–46.0)
Hemoglobin: 12.6 g/dL (ref 12.0–15.0)
MCH: 32.2 pg (ref 26.0–34.0)
MCHC: 35.4 g/dL (ref 30.0–36.0)
MCV: 91 fL (ref 80.0–100.0)
Platelets: 216 10*3/uL (ref 150–400)
RBC: 3.91 MIL/uL (ref 3.87–5.11)
RDW: 11.9 % (ref 11.5–15.5)
WBC: 5.9 10*3/uL (ref 4.0–10.5)
nRBC: 0 % (ref 0.0–0.2)

## 2019-07-03 LAB — MAGNESIUM: Magnesium: 2.2 mg/dL (ref 1.7–2.4)

## 2019-07-03 LAB — COMPREHENSIVE METABOLIC PANEL
ALT: 12 U/L (ref 0–44)
AST: 14 U/L — ABNORMAL LOW (ref 15–41)
Albumin: 3.1 g/dL — ABNORMAL LOW (ref 3.5–5.0)
Alkaline Phosphatase: 61 U/L (ref 38–126)
Anion gap: 6 (ref 5–15)
BUN: 9 mg/dL (ref 8–23)
CO2: 22 mmol/L (ref 22–32)
Calcium: 7.8 mg/dL — ABNORMAL LOW (ref 8.9–10.3)
Chloride: 92 mmol/L — ABNORMAL LOW (ref 98–111)
Creatinine, Ser: 0.93 mg/dL (ref 0.44–1.00)
GFR calc Af Amer: >60 mL/min (ref >60)
GFR calc non Af Amer: 58 mL/min — ABNORMAL LOW (ref >60)
Glucose, Bld: 107 mg/dL — ABNORMAL HIGH (ref 70–99)
Potassium: 3.6 mmol/L (ref 3.5–5.1)
Sodium: 120 mmol/L — ABNORMAL LOW (ref 135–145)
Total Bilirubin: 1 mg/dL (ref 0.3–1.2)
Total Protein: 5.9 g/dL — ABNORMAL LOW (ref 6.5–8.1)

## 2019-07-03 LAB — OSMOLALITY, URINE: Osmolality, Ur: 334 mOsm/kg (ref 300–900)

## 2019-07-03 NOTE — Progress Notes (Signed)
Initial Nutrition Assessment  DOCUMENTATION CODES:   Not applicable  INTERVENTION:  -Ensure Enlive po BID, each supplement provides 350 kcal and 20 grams of protein -MVI daily  Discontinue Boost Breeze due to diet advancement    NUTRITION DIAGNOSIS:   Inadequate oral intake related to acute illness as evidenced by energy intake < or equal to 50% for > or equal to 5 days, per patient/family report, percent weight loss.   GOAL:   Patient will meet greater than or equal to 90% of their needs   MONITOR:   PO intake, Weight trends, Supplement acceptance, Labs, I & O's  REASON FOR ASSESSMENT:   Malnutrition Screening Tool    ASSESSMENT:  78 year old female with medical history significant for HTN, GERD, asthma, diverticulosis, sliding hiatal hernia with high-grade Schatzki ring, antral gastritis who presented to ED for evaluation of epigastric abdominal pain as well as severe nausea that started approximately 1 week ago; reported not tolerating po over the past 2 days.  Patient admitted with severe hyponatremia; Na noted 117 in ED, urinary sodium 108. Patient started on normal saline IV. Ongoing hyponatremia; trending up.  Per chart review, pt reports feeling better today; tolerating CL; diet advanced to [facility] per GI. RD will order Ensure to assist with calorie and protein needs as well as monitor oral intake.   Current wt 52 kg (114.4 lb) Weight history reviewed; noted 8.4 lb wt loss over the past 3 weeks (6.8%; severe for time frame) UBW 55.2 - 55.8 kg over the past year  I/Os: +1094 ml since admit UOP: 900 ml x 24 hrs  Medications reviewed and include: tenex,  Protonix, NaCl @ 75 ml/hr  Labs: Na 120 - addressing; trending up Corrected Ca 8.5 - (L)  NUTRITION - FOCUSED PHYSICAL EXAM: Unable to complete at this time; RD working remotely.    Diet Order:   Diet Order            Diet Heart Room service appropriate? Yes; Fluid consistency: Thin  Diet effective now              EDUCATION NEEDS:   No education needs have been identified at this time  Skin:  Skin Assessment: Reviewed RN Assessment  Last BM:  9/7  Height:   Ht Readings from Last 1 Encounters:  07/02/19 5\' 3"  (1.6 m)    Weight:   Wt Readings from Last 1 Encounters:  07/02/19 52 kg    Ideal Body Weight:  52.3 kg  BMI:  Body mass index is 20.31 kg/m.  Estimated Nutritional Needs:   Kcal:  1560-1760  Protein:  78-88  Fluid:  >1.5L  Lajuan Lines, RD, LDN Office [phone] After Hours/Weekend Pager: [phone]

## 2019-07-03 NOTE — Progress Notes (Signed)
Subjective: She was admitted yesterday with hyponatremia nausea and epigastric abdominal pain.  This is been going on off and on for the last 2 to 3 weeks.  She had been in my office her medications were adjusted and she improved but then she got worse again.  When she came to the emergency department she was noted to have a sodium of 117.  Her blood pressure has been difficult to control.  She had continued to take her diuretic even though she was not eating very well and that probably led to her hyponatremia.  She says she feels a little better.  Objective: Vital signs in last 24 hours: Temp:  [97.7 F (36.5 C)-98.4 F (36.9 C)] 97.7 F (36.5 C) (09/10 0458) Pulse Rate:  [63-87] 76 (09/10 0458) Resp:  [14-24] 16 (09/10 0458) BP: (149-212)/(48-82) 168/76 (09/10 0458) SpO2:  [94 %-99 %] 94 % (09/10 0458) Weight:  [52 kg] 52 kg (09/09 1408) Weight change:  Last BM Date: 06/30/19  Intake/Output from previous day: 09/09 0701 - 09/10 0700 In: 1994.6 [P.O.:360; I.V.:1134.6; IV Piggyback:500] Out: 900 [Urine:900]  PHYSICAL EXAM General appearance: alert, cooperative and no distress Resp: clear to auscultation bilaterally Cardio: regular rate and rhythm, S1, S2 normal, no murmur, click, rub or gallop GI: soft, non-tender; bowel sounds normal; no masses,  no organomegaly Extremities: extremities normal, atraumatic, no cyanosis or edema  Lab Results:  Results for orders placed or performed during the hospital encounter of 07/02/19 (from the past 48 hour(s))  Comprehensive metabolic panel     Status: Abnormal   Collection Time: 07/02/19  8:00 AM  Result Value Ref Range   Sodium 117 (LL) 135 - 145 mmol/L    Comment: CRITICAL RESULT CALLED TO, READ BACK BY AND VERIFIED WITH: WILEY,E AT 8:50AM ON 07/02/19 BY FESTERMAN,C    Potassium 3.7 3.5 - 5.1 mmol/L   Chloride 83 (L) 98 - 111 mmol/L   CO2 23 22 - 32 mmol/L   Glucose, Bld 143 (H) 70 - 99 mg/dL   BUN 11 8 - 23 mg/dL   Creatinine, Ser  1.02 (H) 0.44 - 1.00 mg/dL   Calcium 8.8 (L) 8.9 - 10.3 mg/dL   Total Protein 7.7 6.5 - 8.1 g/dL   Albumin 4.2 3.5 - 5.0 g/dL   AST 17 15 - 41 U/L   ALT 15 0 - 44 U/L   Alkaline Phosphatase 79 38 - 126 U/L   Total Bilirubin 0.7 0.3 - 1.2 mg/dL   GFR calc non Af Amer 52 (L) >60 mL/min   GFR calc Af Amer >60 >60 mL/min   Anion gap 11 5 - 15    Comment: Performed at [facility], [address] [place], [place] [address]  Lipase, blood     Status: None   Collection Time: 07/02/19  8:00 AM  Result Value Ref Range   Lipase 28 11 - 51 U/L    Comment: Performed at [facility], [address] [place], [place] [address]  CBC with Differential     Status: None   Collection Time: 07/02/19  8:00 AM  Result Value Ref Range   WBC 5.7 4.0 - 10.5 K/uL   RBC 4.76 3.87 - 5.11 MIL/uL   Hemoglobin 15.0 12.0 - 15.0 g/dL   HCT 42.3 36.0 - 46.0 %   MCV 88.9 80.0 - 100.0 fL   MCH 31.5 26.0 - 34.0 pg   MCHC 35.5 30.0 - 36.0 g/dL   RDW 11.6 11.5 - 15.5 %  Platelets 269 150 - 400 K/uL   nRBC 0.0 0.0 - 0.2 %   Neutrophils Relative % 50 %   Neutro Abs 2.8 1.7 - 7.7 K/uL   Lymphocytes Relative 37 %   Lymphs Abs 2.1 0.7 - 4.0 K/uL   Monocytes Relative 10 %   Monocytes Absolute 0.6 0.1 - 1.0 K/uL   Eosinophils Relative 2 %   Eosinophils Absolute 0.1 0.0 - 0.5 K/uL   Basophils Relative 1 %   Basophils Absolute 0.0 0.0 - 0.1 K/uL   Immature Granulocytes 0 %   Abs Immature Granulocytes 0.02 0.00 - 0.07 K/uL    Comment: Performed at [facility], [address], [place], [place] [address]  Troponin I (High Sensitivity)     Status: None   Collection Time: 07/02/19  8:00 AM  Result Value Ref Range   Troponin I (High Sensitivity) 4 <18 ng/L    Comment: (NOTE) Elevated high sensitivity troponin I (hsTnI) values and significant  changes across serial measurements may suggest ACS but many other  chronic and acute conditions are known to elevate hsTnI results.  Refer to the Links section for chest pain  algorithms and additional  guidance. Performed at [facility], [address], [place], [place] [address]   Urinalysis, Routine w reflex microscopic     Status: Abnormal   Collection Time: 07/02/19  8:00 AM  Result Value Ref Range   Color, Urine YELLOW YELLOW   APPearance HAZY (A) CLEAR   Specific Gravity, Urine 1.010 1.005 - 1.030   pH 7.0 5.0 - 8.0   Glucose, UA NEGATIVE NEGATIVE mg/dL   Hgb urine dipstick NEGATIVE NEGATIVE   Bilirubin Urine NEGATIVE NEGATIVE   Ketones, ur NEGATIVE NEGATIVE mg/dL   Protein, ur 30 (A) NEGATIVE mg/dL   Nitrite NEGATIVE NEGATIVE   Leukocytes,Ua NEGATIVE NEGATIVE   RBC / HPF 0-5 0 - 5 RBC/hpf   WBC, UA 0-5 0 - 5 WBC/hpf   Bacteria, UA NONE SEEN NONE SEEN   Squamous Epithelial / LPF 0-5 0 - 5   Ca Oxalate Crys, UA PRESENT     Comment: Performed at [facility], [address] [place], [place] [address]  SARS Coronavirus 2 [facility] order, Performed in [facility] hospital lab) Nasopharyngeal Nasopharyngeal Swab     Status: None   Collection Time: 07/02/19  8:52 AM   Specimen: Nasopharyngeal Swab  Result Value Ref Range   SARS Coronavirus 2 NEGATIVE NEGATIVE    Comment: (NOTE) If result is NEGATIVE SARS-CoV-2 target nucleic acids are NOT DETECTED. The SARS-CoV-2 RNA is generally detectable in upper and lower  respiratory specimens during the acute phase of infection. The lowest  concentration of SARS-CoV-2 viral copies this assay can detect is 250  copies / mL. A negative result does not preclude SARS-CoV-2 infection  and should not be used as the sole basis for treatment or other  patient management decisions.  A negative result may occur with  improper specimen collection / handling, submission of specimen other  than nasopharyngeal swab, presence of viral mutation(s) within the  areas targeted by this assay, and inadequate number of viral copies  (<250 copies / mL). A negative result must be combined with clinical  observations, patient  history, and epidemiological information. If result is POSITIVE SARS-CoV-2 target nucleic acids are DETECTED. The SARS-CoV-2 RNA is generally detectable in upper and lower  respiratory specimens dur ing the acute phase of infection.  Positive  results are indicative of active infection with  SARS-CoV-2.  Clinical  correlation with patient history and other diagnostic information is  necessary to determine patient infection status.  Positive results do  not rule out bacterial infection or co-infection with other viruses. If result is PRESUMPTIVE POSTIVE SARS-CoV-2 nucleic acids MAY BE PRESENT.   A presumptive positive result was obtained on the submitted specimen  and confirmed on repeat testing.  While 2019 novel coronavirus  (SARS-CoV-2) nucleic acids may be present in the submitted sample  additional confirmatory testing may be necessary for epidemiological  and / or clinical management purposes  to differentiate between  SARS-CoV-2 and other Sarbecovirus currently known to infect humans.  If clinically indicated additional testing with an alternate test  methodology [phone]) is advised. The SARS-CoV-2 RNA is generally  detectable in upper and lower respiratory sp ecimens during the acute  phase of infection. The expected result is Negative. Fact Sheet for Patients:  StrictlyIdeas.no Fact Sheet for Healthcare Providers: BankingDealers.co.za This test is not yet approved or cleared by the [place] FDA and has been authorized for detection and/or diagnosis of SARS-CoV-2 by FDA under an Emergency Use Authorization (EUA).  This EUA will remain in effect (meaning this test can be used) for the duration of the COVID-19 declaration under Section 564(b)(1) of the Act, 21 U.S.C. section 360bbb-3(b)(1), unless the authorization is terminated or revoked sooner. Performed at [facility], [address] [place], [place] [address]   Troponin  I (High Sensitivity)     Status: None   Collection Time: 07/02/19 10:07 AM  Result Value Ref Range   Troponin I (High Sensitivity) 4 <18 ng/L    Comment: (NOTE) Elevated high sensitivity troponin I (hsTnI) values and significant  changes across serial measurements may suggest ACS but many other  chronic and acute conditions are known to elevate hsTnI results.  Refer to the "Links" section for chest pain algorithms and additional  guidance. Performed at [facility], [address], [place], [place] [address]   Sodium, urine, random     Status: None   Collection Time: 07/02/19 11:01 AM  Result Value Ref Range   Sodium, Ur 108 mmol/L    Comment: Performed at [facility], [address], [place], [place] [address]  TSH     Status: None   Collection Time: 07/02/19 11:23 AM  Result Value Ref Range   TSH 1.979 0.350 - 4.500 uIU/mL    Comment: Performed by a 3rd Generation assay with a functional sensitivity of <=0.01 uIU/mL. Performed at [facility], [address], [place], [place] [address]   Osmolality     Status: Abnormal   Collection Time: 07/02/19 11:23 AM  Result Value Ref Range   Osmolality 251 (L) 275 - 295 mOsm/kg    Comment: Performed at [facility] [address] [place], [place] [id number]  Basic metabolic panel     Status: Abnormal   Collection Time: 07/02/19  2:39 PM  Result Value Ref Range   Sodium 118 (LL) 135 - 145 mmol/L    Comment: CRITICAL RESULT CALLED TO, READ BACK BY AND VERIFIED WITH: FOLEY,RN AT 1504 ON 9.9.20 BY ISLEY,B    Potassium 3.5 3.5 - 5.1 mmol/L   Chloride 85 (L) 98 - 111 mmol/L   CO2 26 22 - 32 mmol/L   Glucose, Bld 130 (H) 70 - 99 mg/dL   BUN 8 8 - 23 mg/dL   Creatinine, Ser 0.92 0.44 - 1.00 mg/dL   Calcium 8.4 (L) 8.9 - 10.3 mg/dL  GFR calc non Af Amer 59 (L) >60 mL/min   GFR calc Af Amer >60 >60 mL/min   Anion gap 7 5 - 15    Comment: Performed at [facility], [address] [place], [place] [address]  Magnesium     Status: None    Collection Time: 07/03/19  4:50 AM  Result Value Ref Range   Magnesium 2.2 1.7 - 2.4 mg/dL    Comment: Performed at [facility], [address], [place], [place] [address]  Comprehensive metabolic panel     Status: Abnormal   Collection Time: 07/03/19  4:50 AM  Result Value Ref Range   Sodium 120 (L) 135 - 145 mmol/L   Potassium 3.6 3.5 - 5.1 mmol/L   Chloride 92 (L) 98 - 111 mmol/L   CO2 22 22 - 32 mmol/L   Glucose, Bld 107 (H) 70 - 99 mg/dL   BUN 9 8 - 23 mg/dL   Creatinine, Ser 0.93 0.44 - 1.00 mg/dL   Calcium 7.8 (L) 8.9 - 10.3 mg/dL   Total Protein 5.9 (L) 6.5 - 8.1 g/dL   Albumin 3.1 (L) 3.5 - 5.0 g/dL   AST 14 (L) 15 - 41 U/L   ALT 12 0 - 44 U/L   Alkaline Phosphatase 61 38 - 126 U/L   Total Bilirubin 1.0 0.3 - 1.2 mg/dL   GFR calc non Af Amer 58 (L) >60 mL/min   GFR calc Af Amer >60 >60 mL/min   Anion gap 6 5 - 15    Comment: Performed at [facility], [address] [place], [place] [address]  CBC     Status: Abnormal   Collection Time: 07/03/19  4:50 AM  Result Value Ref Range   WBC 5.9 4.0 - 10.5 K/uL   RBC 3.91 3.87 - 5.11 MIL/uL   Hemoglobin 12.6 12.0 - 15.0 g/dL   HCT 35.6 (L) 36.0 - 46.0 %   MCV 91.0 80.0 - 100.0 fL   MCH 32.2 26.0 - 34.0 pg   MCHC 35.4 30.0 - 36.0 g/dL   RDW 11.9 11.5 - 15.5 %   Platelets 216 150 - 400 K/uL   nRBC 0.0 0.0 - 0.2 %    Comment: Performed at [facility], [address], [place], [place] [address]    ABGS No results for input(s): PHART, PO2ART, TCO2, HCO3 in the last 72 hours.  Invalid input(s): PCO2 CULTURES Recent Results (from the past 240 hour(s))  SARS Coronavirus 2 [facility] order, Performed in [facility] hospital lab) Nasopharyngeal Nasopharyngeal Swab     Status: None   Collection Time: 07/02/19  8:52 AM   Specimen: Nasopharyngeal Swab  Result Value Ref Range Status   SARS Coronavirus 2 NEGATIVE NEGATIVE Final    Comment: (NOTE) If result is NEGATIVE SARS-CoV-2 target nucleic acids are NOT DETECTED. The  SARS-CoV-2 RNA is generally detectable in upper and lower  respiratory specimens during the acute phase of infection. The lowest  concentration of SARS-CoV-2 viral copies this assay can detect is 250  copies / mL. A negative result does not preclude SARS-CoV-2 infection  and should not be used as the sole basis for treatment or other  patient management decisions.  A negative result may occur with  improper specimen collection / handling, submission of specimen other  than nasopharyngeal swab, presence of viral mutation(s) within the  areas targeted by this assay, and inadequate number of viral copies  (<250 copies / mL). A negative result must be combined  with clinical  observations, patient history, and epidemiological information. If result is POSITIVE SARS-CoV-2 target nucleic acids are DETECTED. The SARS-CoV-2 RNA is generally detectable in upper and lower  respiratory specimens dur ing the acute phase of infection.  Positive  results are indicative of active infection with SARS-CoV-2.  Clinical  correlation with patient history and other diagnostic information is  necessary to determine patient infection status.  Positive results do  not rule out bacterial infection or co-infection with other viruses. If result is PRESUMPTIVE POSTIVE SARS-CoV-2 nucleic acids MAY BE PRESENT.   A presumptive positive result was obtained on the submitted specimen  and confirmed on repeat testing.  While 2019 novel coronavirus  (SARS-CoV-2) nucleic acids may be present in the submitted sample  additional confirmatory testing may be necessary for epidemiological  and / or clinical management purposes  to differentiate between  SARS-CoV-2 and other Sarbecovirus currently known to infect humans.  If clinically indicated additional testing with an alternate test  methodology [phone]) is advised. The SARS-CoV-2 RNA is generally  detectable in upper and lower respiratory sp ecimens during the acute   phase of infection. The expected result is Negative. Fact Sheet for Patients:  StrictlyIdeas.no Fact Sheet for Healthcare Providers: BankingDealers.co.za This test is not yet approved or cleared by the [place] FDA and has been authorized for detection and/or diagnosis of SARS-CoV-2 by FDA under an Emergency Use Authorization (EUA).  This EUA will remain in effect (meaning this test can be used) for the duration of the COVID-19 declaration under Section 564(b)(1) of the Act, 21 U.S.C. section 360bbb-3(b)(1), unless the authorization is terminated or revoked sooner. Performed at [facility], [address], [place], [place] [address]    Studies/Results: Dg Chest 2 View  Result Date: 07/02/2019 CLINICAL DATA:  Chest pain EXAM: CHEST - 2 VIEW COMPARISON:  10/25/2013, 03/14/2011 FINDINGS: Stable cardiomediastinal contours including prominence of the right hilar region, likely reflecting prominent vascularity seen on prior studies. Calcific aortic knob. Large retrocardiac hiatal hernia. No focal airspace consolidation. No pleural effusion. No pneumothorax. No acute osseous findings. IMPRESSION: 1. No acute cardiopulmonary findings. 2. Large hiatal hernia. Electronically Signed   By: Davina Poke M.D.   On: 07/02/2019 08:37   US Abdomen Limited Ruq  Result Date: 07/02/2019 CLINICAL DATA:  Right upper quadrant abdominal pain. EXAM: ULTRASOUND ABDOMEN LIMITED RIGHT UPPER QUADRANT COMPARISON:  CT scan of August 04, 2015. Ultrasound of July 08, 2015. FINDINGS: Gallbladder: No gallstones or wall thickening visualized. No sonographic Murphy sign noted by sonographer. Common bile duct: Diameter: 5 mm which is within normal limits. Liver: No focal lesion identified. Within normal limits in parenchymal echogenicity. Portal vein is patent on color Doppler imaging with normal direction of blood flow towards the liver. Other: None. IMPRESSION: No  abnormality seen in the right upper quadrant of the abdomen. Electronically Signed   By: Marijo Conception M.D.   On: 07/02/2019 08:58    Medications:  Prior to Admission:  Medications Prior to Admission  Medication Sig Dispense Refill Last Dose  . albuterol (PROVENTIL HFA;VENTOLIN HFA) 108 (90 BASE) MCG/ACT inhaler Inhale 2 puffs into the lungs every 6 (six) hours as needed for wheezing or shortness of breath.     . ALPRAZolam (XANAX) 0.25 MG tablet Take 1 tablet by mouth 2 (two) times daily as needed.     . cloNIDine (CATAPRES) 0.1 MG tablet Take 1 tablet by mouth 2 (two) times daily.   07/01/2019 at 2300  .  Cyanocobalamin (VITAMIN B 12 PO) Take 2,000 mcg by mouth daily.   07/01/2019 at Unknown time  . diltiazem (CARDIZEM CD) 360 MG 24 hr capsule Take 360 mg by mouth daily.    07/01/2019 at 2300  . ezetimibe (ZETIA) 10 MG tablet Take 10 mg by mouth daily.   07/01/2019 at 2300  . fish oil-omega-3 fatty acids 1000 MG capsule Take 1 g by mouth 2 (two) times daily.    07/01/2019 at Unknown time  . Fluticasone-Salmeterol (ADVAIR) 250-50 MCG/DOSE AEPB Inhale 1 puff into the lungs every 12 (twelve) hours.    07/01/2019 at 2300  . guanFACINE (TENEX) 1 MG tablet Take 1 mg by mouth at bedtime.     07/01/2019 at 2300  . hydrochlorothiazide (MICROZIDE) 12.5 MG capsule Take 1 capsule by mouth daily.   07/02/2019 at 0730  . loteprednol (LOTEMAX) 0.2 % SUSP Place 1 drop into both eyes daily.    07/01/2019 at Unknown time  . multivitamin-iron-minerals-folic acid (CENTRUM) chewable tablet Chew 1 tablet by mouth daily.   07/01/2019 at 0730  . Netarsudil Dimesylate 0.02 % SOLN Place 1 drop into both eyes every evening.   07/01/2019 at Unknown time  . olmesartan (BENICAR) 40 MG tablet Take 1 tablet by mouth daily.   07/02/2019 at 0730  . pantoprazole (PROTONIX) 40 MG tablet Take 40 mg by mouth daily.   07/02/2019 at 0730  . potassium chloride (K-DUR) 10 MEQ tablet Take 10 mEq by mouth 3 (three) times daily.    07/02/2019 at 0730  .  sucralfate (CARAFATE) 1 g tablet Take 1 tablet by mouth 4 (four) times daily as needed.    Past Week at Unknown time   Scheduled: . cloNIDine  0.1 mg Oral BID  . diltiazem  360 mg Oral Daily  . enoxaparin (LOVENOX) injection  40 mg Subcutaneous Q24H  . feeding supplement  1 Container Oral TID BM  . guanFACINE  1 mg Oral QHS  . irbesartan  300 mg Oral Daily  . loteprednol  1 drop Both Eyes Daily  . mometasone-formoterol  2 puff Inhalation BID  . Netarsudil Dimesylate  1 drop Both Eyes QPM  . pantoprazole (PROTONIX) IV  40 mg Intravenous Q12H  . sucralfate  1 g Oral QID   Continuous: . sodium chloride 75 mL/hr at 07/03/19 0607   [id number] **OR** acetaminophen, albuterol, ALPRAZolam, hydrALAZINE, ondansetron **OR** ondansetron (ZOFRAN) IV  Assesment: She has hyponatremia multifactorial.  She has had poor oral intake and she has continued to take her diuretic.  She has abdominal pain and nausea and has not been eating well at all.  Continue treatments and will get GI to see her.  She has COPD with significant asthmatic component and she is doing well with that right now.  She has hypertension which is been difficult to control as an outpatient  She has significant trouble with both anxiety and depression and is on medication for that Active Problems:   Acute hyponatremia    Plan: Continue current treatments.  Recheck labs tomorrow.  GI consult.    LOS: 1 day   Alonza Bogus 07/03/2019, 8:29 AM

## 2019-07-03 NOTE — Consult Note (Signed)
Referring Provider: Sinda Du, MD Primary Care Physician:  Sinda Du, MD Primary Gastroenterologist:  Dr. Laural Golden  Reason for Consultation:    Nausea and epigastric pain.  HPI:   Patient is 78 year old Caucasian female who has a history of nausea for which she was evaluated few years ago and has been doing well.  Patient was seen in the office on 06/16/2019 for nausea and epigastric pain.  She reported resolution of the symptoms when her PPI was changed. Patient presented to emergency room yesterday morning with weakness and lightheadedness.  She felt she was going to pass out when she was standing.  She also noted retrosternal pressure as well as epigastric pain and nausea.  She says she has never vomited.  On evaluation in emergency room patient was noted to be profoundly hyponatremic with serum sodium 117.  Urinary sodium was 108.  Urinary osmolality was 334 and serum osmolality was 251.  Patient was begun on normal saline.  Serum sodium is improved to 120.  Serum TSH was normal. While in the emergency room she was also given 5 mg of IV metoclopramide with immediate resolution of her nausea. This afternoon she feels much better.  She has been tolerating clear liquids.  She feels her appetite is back.  She has not experienced epigastric pain today.  She denies melena rectal bleeding or diarrhea. Patient states she has not taken care of herself since her husband died in 2023/01/03 this year.  She states she has been grieving and she has been eating very poorly.  She states she has lost close to 15 pounds since then and she has lost 5 pounds in the last 1 week.  She noted weakness about a week ago and has been gradually getting worse to the point that she thought she was going to pass out.  She states she does not drink much fluids or water.  She does not take OTC NSAIDs.  She is on 12.5 mg of HCTZ every day.  Patient is widowed.  As noted above her husband died in 2023-01-03 this year.  She has  4 stepchildren.  She is very close to them.  She does not smoke cigarettes or drink alcohol.  She works for [employer].  She had an office job.  She worked in [place]. Her father died of prostate carcinoma at age 23 and mother lived to be 42.  She developed dementia before she passed away.  Patient does not have any siblings    Past Medical History:  Diagnosis Date  . Asthma   . Dysphagia 05/10/2017  . Elevated transaminase level   . GERD (gastroesophageal reflux disease)   . Hypertension   . MVP (mitral valve prolapse)     Past Surgical History:  Procedure Laterality Date  . COLONOSCOPY    . ESOPHAGEAL DILATION  07/30/2015   Procedure: ESOPHAGEAL DILATION;  Surgeon: Rogene Houston, MD;  Location: [facility];  Service: Endoscopy;;  . ESOPHAGOGASTRODUODENOSCOPY N/A 04/15/2015   Procedure: ESOPHAGOGASTRODUODENOSCOPY (EGD);  Surgeon: Rogene Houston, MD;  Location: [facility];  Service: Endoscopy;  Laterality: N/A;  125 - moved to 1:00, Ann to notify pt  . ESOPHAGOGASTRODUODENOSCOPY N/A 07/30/2015   Procedure: ESOPHAGOGASTRODUODENOSCOPY (EGD);  Surgeon: Rogene Houston, MD;  Location: [facility];  Service: Endoscopy;  Laterality: N/A;  1040  . ESOPHAGOGASTRODUODENOSCOPY (EGD) WITH ESOPHAGEAL DILATION N/A 03/07/2013   Procedure: ESOPHAGOGASTRODUODENOSCOPY (EGD) WITH ESOPHAGEAL DILATION;  Surgeon: Rogene Houston, MD;  Location: [facility];  Service: Endoscopy;  Laterality: N/A;  830  . EYE SURGERY    . LAPAROSCOPIC TUBAL LIGATION  1984  . UPPER GASTROINTESTINAL ENDOSCOPY      Prior to Admission medications   Medication Sig Start Date End Date Taking? Authorizing Provider  albuterol (PROVENTIL HFA;VENTOLIN HFA) 108 (90 BASE) MCG/ACT inhaler Inhale 2 puffs into the lungs every 6 (six) hours as needed for wheezing or shortness of breath.   Yes [provider]  ALPRAZolam (XANAX) 0.25 MG tablet Take 1 tablet by mouth 2 (two) times daily as needed. 04/30/19  Yes [provider]  cloNIDine (CATAPRES) 0.1 MG tablet Take 1 tablet by mouth 2 (two) times daily. 06/20/19  Yes [provider]  Cyanocobalamin (VITAMIN B 12 PO) Take 2,000 mcg by mouth daily.   Yes [provider]  diltiazem (CARDIZEM CD) 360 MG 24 hr capsule Take 360 mg by mouth daily.    Yes [provider]  ezetimibe (ZETIA) 10 MG tablet Take 10 mg by mouth daily.   Yes [provider]  fish oil-omega-3 fatty acids 1000 MG capsule Take 1 g by mouth 2 (two) times daily.    Yes [provider]  Fluticasone-Salmeterol (ADVAIR) 250-50 MCG/DOSE AEPB Inhale 1 puff into the lungs every 12 (twelve) hours.    Yes [provider]  guanFACINE (TENEX) 1 MG tablet Take 1 mg by mouth at bedtime.     Yes [provider]  hydrochlorothiazide (MICROZIDE) 12.5 MG capsule Take 1 capsule by mouth daily. 05/08/19  Yes [provider]  loteprednol (LOTEMAX) 0.2 % SUSP Place 1 drop into both eyes daily.    Yes [provider]  multivitamin-iron-minerals-folic acid (CENTRUM) chewable tablet Chew 1 tablet by mouth daily.   Yes [provider]  Netarsudil Dimesylate 0.02 % SOLN Place 1 drop into both eyes every evening.   Yes [provider]  olmesartan (BENICAR) 40 MG tablet Take 1 tablet by mouth daily. 06/18/19  Yes [provider]  pantoprazole (PROTONIX) 40 MG tablet Take 40 mg by mouth daily.   Yes [provider]  potassium chloride (K-DUR) 10 MEQ tablet Take 10 mEq by mouth 3 (three) times daily.    Yes [provider]  sucralfate (CARAFATE) 1 g tablet Take 1 tablet by mouth 4 (four) times daily as needed.  06/16/19  Yes [provider]    Current Facility-Administered Medications  Medication Dose Route Frequency Provider Last Rate Last Dose  . 0.9 %  sodium chloride infusion   Intravenous Continuous Heath Lark D, DO 75 mL/hr at 07/03/19 0954    . acetaminophen (TYLENOL) tablet 650  mg  650 mg Oral Q6H PRN Manuella Ghazi, Pratik D, DO       Or  . acetaminophen (TYLENOL) suppository 650 mg  650 mg Rectal Q6H PRN Manuella Ghazi, Pratik D, DO      . albuterol (PROVENTIL) (2.5 MG/3ML) 0.083% nebulizer solution 2.5 mg  2.5 mg Nebulization Q6H PRN Manuella Ghazi, Pratik D, DO      . ALPRAZolam Duanne Moron) tablet 0.25 mg  0.25 mg Oral BID PRN Manuella Ghazi, Pratik D, DO   0.25 mg at 07/02/19 1426  . cloNIDine (CATAPRES) tablet 0.1 mg  0.1 mg Oral BID Manuella Ghazi, Pratik D, DO   0.1 mg at 07/03/19 [id number]  . diltiazem (CARDIZEM CD) 24 hr capsule 360 mg  360 mg Oral Daily Manuella Ghazi, Pratik D, DO   360 mg at 07/02/19 2210  . enoxaparin (LOVENOX) injection 40  mg  40 mg Subcutaneous Q24H Manuella Ghazi, Pratik D, DO   40 mg at 07/03/19 1115  . feeding supplement (BOOST / RESOURCE BREEZE) liquid 1 Container  1 Container Oral TID BM Manuella Ghazi, Pratik D, DO   1 Container at 07/03/19 1255  . guanFACINE (TENEX) tablet 1 mg  1 mg Oral QHS Shah, Pratik D, DO   1 mg at 07/02/19 2210  . hydrALAZINE (APRESOLINE) injection 10 mg  10 mg Intravenous Q4H PRN Manuella Ghazi, Pratik D, DO   10 mg at 07/02/19 1426  . irbesartan (AVAPRO) tablet 300 mg  300 mg Oral Daily Manuella Ghazi, Pratik D, DO   300 mg at 07/03/19 [id number]  . loteprednol (LOTEMAX) 0.2 % ophthalmic suspension 1 drop  1 drop Both Eyes Daily Manuella Ghazi, Pratik D, DO      . mometasone-formoterol (DULERA) 200-5 MCG/ACT inhaler 2 puff  2 puff Inhalation BID Manuella Ghazi, Pratik D, DO   2 puff at 07/03/19 0831  . Netarsudil Dimesylate 0.02 % SOLN 1 drop  1 drop Both Eyes QPM Shah, Pratik D, DO   1 drop at 07/02/19 1816  . ondansetron (ZOFRAN) tablet 4 mg  4 mg Oral Q6H PRN Manuella Ghazi, Pratik D, DO       Or  . ondansetron (ZOFRAN) injection 4 mg  4 mg Intravenous Q6H PRN Manuella Ghazi, Pratik D, DO   4 mg at 07/02/19 1426  . pantoprazole (PROTONIX) injection 40 mg  40 mg Intravenous Q12H Shah, Pratik D, DO   40 mg at 07/03/19 [id number]    Allergies as of 07/02/2019 - Review Complete 07/02/2019  Allergen Reaction Noted  . Apple Other (See Comments) 07/03/2015  .  Aspirin Other (See Comments) 03/14/2011  . Avelox [moxifloxacin hcl in nacl] Hives 02/19/2013  . Bee venom Other (See Comments) 10/25/2013  . Benadryl [diphenhydramine hcl] Other (See Comments) 03/14/2011  . Chicken allergy Other (See Comments) 10/25/2013  . Codeine Other (See Comments) 10/25/2013  . Factive [gemifloxacin mesylate] Hives 03/14/2011  . Factive [gemifloxacin] Hives 02/19/2013  . Iodinated diagnostic agents Other (See Comments) 06/17/2015  . Iodine  02/07/2016  . Levaquin [levofloxacin in d5w] Hives 02/19/2013  . Levofloxacin Hives 03/14/2011  . Penicillins Other (See Comments) 03/14/2011  . Sulfa antibiotics Nausea Only 03/14/2011  . Tequin [gatifloxacin] Hives 02/19/2013    Family History  Problem Relation Age of Onset  . Hypertension Mother   . Prostate cancer Father     Social History   Socioeconomic History  . Marital status: Widowed    Spouse name: Not on file  . Number of children: Not on file  . Years of education: Not on file  . Highest education level: Not on file  Occupational History  . Not on file  Social Needs  . Financial resource strain: Not on file  . Food insecurity    Worry: Not on file    Inability: Not on file  . Transportation needs    Medical: Not on file    Non-medical: Not on file  Tobacco Use  . Smoking status: Never Smoker  . Smokeless tobacco: Never Used  Substance and Sexual Activity  . Alcohol use: No    Alcohol/week: 0.0 standard drinks  . Drug use: No  . Sexual activity: Not on file  Lifestyle  . Physical activity    Days per week: Not on file    Minutes per session: Not on file  . Stress: Not on file  Relationships  . Social Herbalist on  phone: Not on file    Gets together: Not on file    Attends religious service: Not on file    Active member of club or organization: Not on file    Attends meetings of clubs or organizations: Not on file    Relationship status: Not on file  . Intimate partner  violence    Fear of current or ex partner: Not on file    Emotionally abused: Not on file    Physically abused: Not on file    Forced sexual activity: Not on file  Other Topics Concern  . Not on file  Social History Narrative  . Not on file    Review of Systems: See HPI, otherwise normal ROS  Physical Exam: Temp:  [97.7 F (36.5 C)-98.4 F (36.9 C)] 97.9 F (36.6 C) (09/10 1331) Pulse Rate:  [61-76] 61 (09/10 1331) Resp:  [16-18] 18 (09/10 1331) BP: (149-172)/(62-76) 156/62 (09/10 1331) SpO2:  [94 %-99 %] 99 % (09/10 1331) Last BM Date: 06/30/19 Patient is alert and in no acute distress. Conjunctiva is pink.  Sclerae nonicteric. Oropharyngeal mucosa is normal. Patient in satisfactory condition. Neck without thyromegaly or lymphadenopathy. Cardiac exam with regular rhythm normal S1 and S2.  No murmur gallop noted. Auscultation lungs reveal vesicular breath bilaterally.  No rales or rhonchi. Abdomen is symmetrical.  Bowel sounds are normal.  On palpation abdomen is soft and nontender with organomegaly or masses. Peripheral edema or clubbing noted.  Lab Results: Recent Labs    07/02/19 0800 07/03/19 0450  WBC 5.7 5.9  HGB 15.0 12.6  HCT 42.3 35.6*  PLT 269 216   BMET Recent Labs    07/02/19 0800 07/02/19 1439 07/03/19 0450  NA 117* 118* 120*  K 3.7 3.5 3.6  CL 83* 85* 92*  CO2 23 26 22   GLUCOSE 143* 130* 107*  BUN 11 8 9   CREATININE 1.02* 0.92 0.93  CALCIUM 8.8* 8.4* 7.8*   LFT Recent Labs    07/03/19 0450  PROT 5.9*  ALBUMIN 3.1*  AST 14*  ALT 12  ALKPHOS 61  BILITOT 1.0    Studies/Results: Dg Chest 2 View  Result Date: 07/02/2019 CLINICAL DATA:  Chest pain EXAM: CHEST - 2 VIEW COMPARISON:  10/25/2013, 03/14/2011 FINDINGS: Stable cardiomediastinal contours including prominence of the right hilar region, likely reflecting prominent vascularity seen on prior studies. Calcific aortic knob. Large retrocardiac hiatal hernia. No focal airspace  consolidation. No pleural effusion. No pneumothorax. No acute osseous findings. IMPRESSION: 1. No acute cardiopulmonary findings. 2. Large hiatal hernia. Electronically Signed   By: Davina Poke M.D.   On: 07/02/2019 08:37   US Abdomen Limited Ruq  Result Date: 07/02/2019 CLINICAL DATA:  Right upper quadrant abdominal pain. EXAM: ULTRASOUND ABDOMEN LIMITED RIGHT UPPER QUADRANT COMPARISON:  CT scan of August 04, 2015. Ultrasound of July 08, 2015. FINDINGS: Gallbladder: No gallstones or wall thickening visualized. No sonographic Murphy sign noted by sonographer. Common bile duct: Diameter: 5 mm which is within normal limits. Liver: No focal lesion identified. Within normal limits in parenchymal echogenicity. Portal vein is patent on color Doppler imaging with normal direction of blood flow towards the liver. Other: None. IMPRESSION: No abnormality seen in the right upper quadrant of the abdomen. Electronically Signed   By: Marijo Conception M.D.   On: 07/02/2019 08:58    Assessment;  Patient is 78 year old Caucasian female who who presented to emergency room yesterday with chest pressure nausea retrosternal pressure as well as epigastric pain.  She was found to be profoundly hyponatremic with serum sodium of 117.  LFTs are unremarkable.  Abdominal ultrasound was negative for cholelithiasis or dilated bile duct.  Patient was given IV metoclopramide in emergency room with resolution of her nausea and epigastric pain. Patient begun on IV normal saline and admitted for further management.  #1. Nausea.  Patient has history of nausea but had been asymptomatic until few weeks ago when she was seen in the office and seemed to get better with changing her PPI.  This episode of nausea is possibly related to profound hyponatremia.  If nausea persists despite correction of hyponatremia would consider diagnostic EGD given that she also has been having epigastric pain.  #2.  Epigastric pain.  This afternoon she  is pain-free.  Abdominal examinations unremarkable.  LFTs are normal.  No evidence of cholelithiasis or dilated bile duct.  She is at risk for peptic ulcer disease given that she is still grieving over the loss of her husband who passed away in 12/21/2022 this year.  Will continue PPI.  Will consider EGD if pain relapses.  #3.  Known large hiatal hernia.  Typical GERD symptoms well controlled with therapy.  #3.  Hyponatremia.  She is on low-dose diuretic.  Does not appear that she has polydipsia.  If anything she is not been drinking enough fluids.  However her serum osmolality is low.  Need to rule out Addison's disease.  Urine and serum osmolality however may suggest SIADH.   Recommendations;  Advance diet to heart healthy. Continue PPI.  Will change dose to oral tomorrow. Fasting cortisol level. Patient will be reevaluated in a.m.   LOS: 1 day   Jayshon Dommer  07/03/2019, 4:58 PM

## 2019-07-04 LAB — BASIC METABOLIC PANEL
Anion gap: 7 (ref 5–15)
BUN: 9 mg/dL (ref 8–23)
CO2: 23 mmol/L (ref 22–32)
Calcium: 8.1 mg/dL — ABNORMAL LOW (ref 8.9–10.3)
Chloride: 98 mmol/L (ref 98–111)
Creatinine, Ser: 0.97 mg/dL (ref 0.44–1.00)
GFR calc Af Amer: >60 mL/min (ref >60)
GFR calc non Af Amer: 55 mL/min — ABNORMAL LOW (ref >60)
Glucose, Bld: 109 mg/dL — ABNORMAL HIGH (ref 70–99)
Potassium: 3 mmol/L — ABNORMAL LOW (ref 3.5–5.1)
Sodium: 128 mmol/L — ABNORMAL LOW (ref 135–145)

## 2019-07-04 LAB — CBC WITH DIFFERENTIAL/PLATELET
Abs Immature Granulocytes: 0.02 10*3/uL (ref 0.00–0.07)
Basophils Absolute: 0 10*3/uL (ref 0.0–0.1)
Basophils Relative: 1 %
Eosinophils Absolute: 0.1 10*3/uL (ref 0.0–0.5)
Eosinophils Relative: 2 %
HCT: 37.1 % (ref 36.0–46.0)
Hemoglobin: 12.8 g/dL (ref 12.0–15.0)
Immature Granulocytes: 0 %
Lymphocytes Relative: 23 %
Lymphs Abs: 1.3 10*3/uL (ref 0.7–4.0)
MCH: 31.7 pg (ref 26.0–34.0)
MCHC: 34.5 g/dL (ref 30.0–36.0)
MCV: 91.8 fL (ref 80.0–100.0)
Monocytes Absolute: 0.7 10*3/uL (ref 0.1–1.0)
Monocytes Relative: 13 %
Neutro Abs: 3.5 10*3/uL (ref 1.7–7.7)
Neutrophils Relative %: 61 %
Platelets: 212 10*3/uL (ref 150–400)
RBC: 4.04 MIL/uL (ref 3.87–5.11)
RDW: 12.1 % (ref 11.5–15.5)
WBC: 5.7 10*3/uL (ref 4.0–10.5)
nRBC: 0 % (ref 0.0–0.2)

## 2019-07-04 LAB — CORTISOL: Cortisol, Plasma: 10.9 ug/dL

## 2019-07-04 MED ORDER — SERTRALINE HCL 50 MG PO TABS
25.0000 mg | ORAL_TABLET | Freq: Every day | ORAL | Status: DC
  Administered 2019-07-04 – 2019-07-07 (×4): 25 mg via ORAL
  Filled 2019-07-04 (×4): qty 1

## 2019-07-04 MED ORDER — POTASSIUM CHLORIDE CRYS ER 20 MEQ PO TBCR
20.0000 meq | EXTENDED_RELEASE_TABLET | Freq: Two times a day (BID) | ORAL | Status: DC
  Administered 2019-07-04 – 2019-07-07 (×7): 20 meq via ORAL
  Filled 2019-07-04 (×7): qty 1

## 2019-07-04 MED ORDER — DOCUSATE SODIUM 100 MG PO CAPS
100.0000 mg | ORAL_CAPSULE | Freq: Every day | ORAL | Status: DC
  Administered 2019-07-04 – 2019-07-06 (×3): 100 mg via ORAL
  Filled 2019-07-04 (×3): qty 1

## 2019-07-04 MED ORDER — CLONIDINE HCL 0.2 MG PO TABS
0.2000 mg | ORAL_TABLET | Freq: Two times a day (BID) | ORAL | Status: DC
  Administered 2019-07-04 – 2019-07-06 (×6): 0.2 mg via ORAL
  Filled 2019-07-04 (×7): qty 1

## 2019-07-04 NOTE — Progress Notes (Signed)
Subjective: She says she had a bad night.  She had more trouble with her blood pressure.  She has a headache this morning.  She is more anxious.  She seems depressed Her stomach is doing much better and her sodium level is better Objective: Vital signs in last 24 hours: Temp:  [97.6 F (36.4 C)-98.1 F (36.7 C)] 97.6 F (36.4 C) (09/11 0524) Pulse Rate:  [61-86] 83 (09/11 0524) Resp:  [16-18] 17 (09/11 0524) BP: (156-227)/(62-88) 181/66 (09/11 0524) SpO2:  [95 %-99 %] 95 % (09/11 0726) Weight change:  Last BM Date: 06/30/19  Intake/Output from previous day: 09/10 0701 - 09/11 0700 In: 1680.3 [I.V.:1680.3] Out: 2500 [Urine:2500]  PHYSICAL EXAM General appearance: alert, cooperative and Anxious and depressed Resp: clear to auscultation bilaterally Cardio: regular rate and rhythm, S1, S2 normal, no murmur, click, rub or gallop GI: soft, non-tender; bowel sounds normal; no masses,  no organomegaly Extremities: extremities normal, atraumatic, no cyanosis or edema  Lab Results:  Results for orders placed or performed during the hospital encounter of 07/02/19 (from the past 48 hour(s))  SARS Coronavirus 2 [facility] order, Performed in [facility] hospital lab) Nasopharyngeal Nasopharyngeal Swab     Status: None   Collection Time: 07/02/19  8:52 AM   Specimen: Nasopharyngeal Swab  Result Value Ref Range   SARS Coronavirus 2 NEGATIVE NEGATIVE    Comment: (NOTE) If result is NEGATIVE SARS-CoV-2 target nucleic acids are NOT DETECTED. The SARS-CoV-2 RNA is generally detectable in upper and lower  respiratory specimens during the acute phase of infection. The lowest  concentration of SARS-CoV-2 viral copies this assay can detect is 250  copies / mL. A negative result does not preclude SARS-CoV-2 infection  and should not be used as the sole basis for treatment or other  patient management decisions.  A negative result may occur with  improper specimen collection / handling, submission  of specimen other  than nasopharyngeal swab, presence of viral mutation(s) within the  areas targeted by this assay, and inadequate number of viral copies  (<250 copies / mL). A negative result must be combined with clinical  observations, patient history, and epidemiological information. If result is POSITIVE SARS-CoV-2 target nucleic acids are DETECTED. The SARS-CoV-2 RNA is generally detectable in upper and lower  respiratory specimens dur ing the acute phase of infection.  Positive  results are indicative of active infection with SARS-CoV-2.  Clinical  correlation with patient history and other diagnostic information is  necessary to determine patient infection status.  Positive results do  not rule out bacterial infection or co-infection with other viruses. If result is PRESUMPTIVE POSTIVE SARS-CoV-2 nucleic acids MAY BE PRESENT.   A presumptive positive result was obtained on the submitted specimen  and confirmed on repeat testing.  While 2019 novel coronavirus  (SARS-CoV-2) nucleic acids may be present in the submitted sample  additional confirmatory testing may be necessary for epidemiological  and / or clinical management purposes  to differentiate between  SARS-CoV-2 and other Sarbecovirus currently known to infect humans.  If clinically indicated additional testing with an alternate test  methodology [phone]) is advised. The SARS-CoV-2 RNA is generally  detectable in upper and lower respiratory sp ecimens during the acute  phase of infection. The expected result is Negative. Fact Sheet for Patients:  StrictlyIdeas.no Fact Sheet for Healthcare Providers: BankingDealers.co.za This test is not yet approved or cleared by the [place] FDA and has been authorized for detection and/or diagnosis of SARS-CoV-2 by FDA  under an Emergency Use Authorization (EUA).  This EUA will remain in effect (meaning this test can be used) for  the duration of the COVID-19 declaration under Section 564(b)(1) of the Act, 21 U.S.C. section 360bbb-3(b)(1), unless the authorization is terminated or revoked sooner. Performed at [facility], [address], [place], [place] [address]   Troponin I (High Sensitivity)     Status: None   Collection Time: 07/02/19 10:07 AM  Result Value Ref Range   Troponin I (High Sensitivity) 4 <18 ng/L    Comment: (NOTE) Elevated high sensitivity troponin I (hsTnI) values and significant  changes across serial measurements may suggest ACS but many other  chronic and acute conditions are known to elevate hsTnI results.  Refer to the "Links" section for chest pain algorithms and additional  guidance. Performed at [facility], [address], [place], [place] [address]   Osmolality, urine     Status: None   Collection Time: 07/02/19 11:00 AM  Result Value Ref Range   Osmolality, Ur 334 300 - 900 mOsm/kg    Comment: Performed at [facility] [address], [place], [place] [address]  Sodium, urine, random     Status: None   Collection Time: 07/02/19 11:01 AM  Result Value Ref Range   Sodium, Ur 108 mmol/L    Comment: Performed at [facility], [address] [place], [place] [address]  TSH     Status: None   Collection Time: 07/02/19 11:23 AM  Result Value Ref Range   TSH 1.979 0.350 - 4.500 uIU/mL    Comment: Performed by a 3rd Generation assay with a functional sensitivity of <=0.01 uIU/mL. Performed at [facility], [address] [place], [place] [address]   Osmolality     Status: Abnormal   Collection Time: 07/02/19 11:23 AM  Result Value Ref Range   Osmolality 251 (L) 275 - 295 mOsm/kg    Comment: Performed at [facility] [address] [place], [place] [id number]  Basic metabolic panel     Status: Abnormal   Collection Time: 07/02/19  2:39 PM  Result Value Ref Range   Sodium 118 (LL) 135 - 145 mmol/L    Comment: CRITICAL RESULT CALLED TO, READ BACK BY AND VERIFIED  WITH: FOLEY,RN AT 1504 ON 9.9.20 BY ISLEY,B    Potassium 3.5 3.5 - 5.1 mmol/L   Chloride 85 (L) 98 - 111 mmol/L   CO2 26 22 - 32 mmol/L   Glucose, Bld 130 (H) 70 - 99 mg/dL   BUN 8 8 - 23 mg/dL   Creatinine, Ser 0.92 0.44 - 1.00 mg/dL   Calcium 8.4 (L) 8.9 - 10.3 mg/dL   GFR calc non Af Amer 59 (L) >60 mL/min   GFR calc Af Amer >60 >60 mL/min   Anion gap 7 5 - 15    Comment: Performed at [facility], [address], [place], [place] [address]  Magnesium     Status: None   Collection Time: 07/03/19  4:50 AM  Result Value Ref Range   Magnesium 2.2 1.7 - 2.4 mg/dL    Comment: Performed at [facility], [address], [place],  [address]  Comprehensive metabolic panel     Status: Abnormal   Collection Time: 07/03/19  4:50 AM  Result Value Ref Range   Sodium 120 (L) 135 - 145 mmol/L   Potassium 3.6 3.5 - 5.1 mmol/L   Chloride 92 (L) 98 - 111 mmol/L   CO2 22  22 - 32 mmol/L   Glucose, Bld 107 (H) 70 - 99 mg/dL   BUN 9 8 - 23 mg/dL   Creatinine, Ser 0.93 0.44 - 1.00 mg/dL   Calcium 7.8 (L) 8.9 - 10.3 mg/dL   Total Protein 5.9 (L) 6.5 - 8.1 g/dL   Albumin 3.1 (L) 3.5 - 5.0 g/dL   AST 14 (L) 15 - 41 U/L   ALT 12 0 - 44 U/L   Alkaline Phosphatase 61 38 - 126 U/L   Total Bilirubin 1.0 0.3 - 1.2 mg/dL   GFR calc non Af Amer 58 (L) >60 mL/min   GFR calc Af Amer >60 >60 mL/min   Anion gap 6 5 - 15    Comment: Performed at [facility], [address], [place], [place] [address]  CBC     Status: Abnormal   Collection Time: 07/03/19  4:50 AM  Result Value Ref Range   WBC 5.9 4.0 - 10.5 K/uL   RBC 3.91 3.87 - 5.11 MIL/uL   Hemoglobin 12.6 12.0 - 15.0 g/dL   HCT 35.6 (L) 36.0 - 46.0 %   MCV 91.0 80.0 - 100.0 fL   MCH 32.2 26.0 - 34.0 pg   MCHC 35.4 30.0 - 36.0 g/dL   RDW 11.9 11.5 - 15.5 %   Platelets 216 150 - 400 K/uL   nRBC 0.0 0.0 - 0.2 %    Comment: Performed at [facility], [address] [place], [place] [id number]  Basic metabolic panel     Status: Abnormal    Collection Time: 07/04/19  4:30 AM  Result Value Ref Range   Sodium 128 (L) 135 - 145 mmol/L    Comment: DELTA CHECK NOTED   Potassium 3.0 (L) 3.5 - 5.1 mmol/L   Chloride 98 98 - 111 mmol/L   CO2 23 22 - 32 mmol/L   Glucose, Bld 109 (H) 70 - 99 mg/dL   BUN 9 8 - 23 mg/dL   Creatinine, Ser 0.97 0.44 - 1.00 mg/dL   Calcium 8.1 (L) 8.9 - 10.3 mg/dL   GFR calc non Af Amer 55 (L) >60 mL/min   GFR calc Af Amer >60 >60 mL/min   Anion gap 7 5 - 15    Comment: Performed at [facility], [address], [place], [place] [address]  CBC with Differential/Platelet     Status: None   Collection Time: 07/04/19  4:30 AM  Result Value Ref Range   WBC 5.7 4.0 - 10.5 K/uL   RBC 4.04 3.87 - 5.11 MIL/uL   Hemoglobin 12.8 12.0 - 15.0 g/dL   HCT 37.1 36.0 - 46.0 %   MCV 91.8 80.0 - 100.0 fL   MCH 31.7 26.0 - 34.0 pg   MCHC 34.5 30.0 - 36.0 g/dL   RDW 12.1 11.5 - 15.5 %   Platelets 212 150 - 400 K/uL   nRBC 0.0 0.0 - 0.2 %   Neutrophils Relative % 61 %   Neutro Abs 3.5 1.7 - 7.7 K/uL   Lymphocytes Relative 23 %   Lymphs Abs 1.3 0.7 - 4.0 K/uL   Monocytes Relative 13 %   Monocytes Absolute 0.7 0.1 - 1.0 K/uL   Eosinophils Relative 2 %   Eosinophils Absolute 0.1 0.0 - 0.5 K/uL   Basophils Relative 1 %   Basophils Absolute 0.0 0.0 - 0.1 K/uL   Immature Granulocytes 0 %   Abs Immature Granulocytes 0.02 0.00 - 0.07 K/uL    Comment: Performed at [facility], [address], [place], [place] [address]    ABGS No results for input(s): PHART, PO2ART, TCO2, HCO3 in the last 72 hours.  Invalid input(s): PCO2 CULTURES Recent Results (from the past 240 hour(s))  SARS Coronavirus 2 [facility] order, Performed in [facility] hospital lab) Nasopharyngeal Nasopharyngeal Swab     Status: None   Collection Time: 07/02/19  8:52 AM   Specimen: Nasopharyngeal Swab  Result Value Ref Range Status   SARS Coronavirus 2 NEGATIVE NEGATIVE Final    Comment: (NOTE) If result is NEGATIVE SARS-CoV-2 target nucleic  acids are NOT DETECTED. The SARS-CoV-2 RNA is generally detectable in upper and lower  respiratory specimens during the acute phase of infection. The lowest  concentration of SARS-CoV-2 viral copies this assay can detect is 250  copies / mL. A negative result does not preclude SARS-CoV-2 infection  and should not be used as the sole basis for treatment or other  patient management decisions.  A negative result may occur with  improper specimen collection / handling, submission of specimen other  than nasopharyngeal swab, presence of viral mutation(s) within the  areas targeted by this assay, and inadequate number of viral copies  (<250 copies / mL). A negative result must be combined with clinical  observations, patient history, and epidemiological information. If result is POSITIVE SARS-CoV-2 target nucleic acids are DETECTED. The SARS-CoV-2 RNA is generally detectable in upper and lower  respiratory specimens dur ing the acute phase of infection.  Positive  results are indicative of active infection with SARS-CoV-2.  Clinical  correlation with patient history and other diagnostic information is  necessary to determine patient infection status.  Positive results do  not rule out bacterial infection or co-infection with other viruses. If result is PRESUMPTIVE POSTIVE SARS-CoV-2 nucleic acids MAY BE PRESENT.   A presumptive positive result was obtained on the submitted specimen  and confirmed on repeat testing.  While 2019 novel coronavirus  (SARS-CoV-2) nucleic acids may be present in the submitted sample  additional confirmatory testing may be necessary for epidemiological  and / or clinical management purposes  to differentiate between  SARS-CoV-2 and other Sarbecovirus currently known to infect humans.  If clinically indicated additional testing with an alternate test  methodology [phone]) is advised. The SARS-CoV-2 RNA is generally  detectable in upper and lower respiratory  sp ecimens during the acute  phase of infection. The expected result is Negative. Fact Sheet for Patients:  StrictlyIdeas.no Fact Sheet for Healthcare Providers: BankingDealers.co.za This test is not yet approved or cleared by the [place] FDA and has been authorized for detection and/or diagnosis of SARS-CoV-2 by FDA under an Emergency Use Authorization (EUA).  This EUA will remain in effect (meaning this test can be used) for the duration of the COVID-19 declaration under Section 564(b)(1) of the Act, 21 U.S.C. section 360bbb-3(b)(1), unless the authorization is terminated or revoked sooner. Performed at [facility], [address] [place], [place] [address]    Studies/Results: No results found.  Medications:  Prior to Admission:  Medications Prior to Admission  Medication Sig Dispense Refill Last Dose  . albuterol (PROVENTIL HFA;VENTOLIN HFA) 108 (90 BASE) MCG/ACT inhaler Inhale 2 puffs into the lungs every 6 (six) hours as needed for wheezing or shortness of breath.     . ALPRAZolam (XANAX) 0.25 MG tablet Take 1 tablet by mouth 2 (two) times daily as needed.     . cloNIDine (CATAPRES) 0.1 MG tablet Take 1 tablet by mouth 2 (two)  times daily.   07/01/2019 at 2300  . Cyanocobalamin (VITAMIN B 12 PO) Take 2,000 mcg by mouth daily.   07/01/2019 at Unknown time  . diltiazem (CARDIZEM CD) 360 MG 24 hr capsule Take 360 mg by mouth daily.    07/01/2019 at 2300  . ezetimibe (ZETIA) 10 MG tablet Take 10 mg by mouth daily.   07/01/2019 at 2300  . fish oil-omega-3 fatty acids 1000 MG capsule Take 1 g by mouth 2 (two) times daily.    07/01/2019 at Unknown time  . Fluticasone-Salmeterol (ADVAIR) 250-50 MCG/DOSE AEPB Inhale 1 puff into the lungs every 12 (twelve) hours.    07/01/2019 at 2300  . guanFACINE (TENEX) 1 MG tablet Take 1 mg by mouth at bedtime.     07/01/2019 at 2300  . hydrochlorothiazide (MICROZIDE) 12.5 MG capsule Take 1 capsule by mouth  daily.   07/02/2019 at 0730  . loteprednol (LOTEMAX) 0.2 % SUSP Place 1 drop into both eyes daily.    07/01/2019 at Unknown time  . multivitamin-iron-minerals-folic acid (CENTRUM) chewable tablet Chew 1 tablet by mouth daily.   07/01/2019 at 0730  . Netarsudil Dimesylate 0.02 % SOLN Place 1 drop into both eyes every evening.   07/01/2019 at Unknown time  . olmesartan (BENICAR) 40 MG tablet Take 1 tablet by mouth daily.   07/02/2019 at 0730  . pantoprazole (PROTONIX) 40 MG tablet Take 40 mg by mouth daily.   07/02/2019 at 0730  . potassium chloride (K-DUR) 10 MEQ tablet Take 10 mEq by mouth 3 (three) times daily.    07/02/2019 at 0730  . sucralfate (CARAFATE) 1 g tablet Take 1 tablet by mouth 4 (four) times daily as needed.    Past Week at Unknown time   Scheduled: . cloNIDine  0.2 mg Oral BID  . diltiazem  360 mg Oral Daily  . enoxaparin (LOVENOX) injection  40 mg Subcutaneous Q24H  . feeding supplement  1 Container Oral TID BM  . guanFACINE  1 mg Oral QHS  . irbesartan  300 mg Oral Daily  . loteprednol  1 drop Both Eyes Daily  . mometasone-formoterol  2 puff Inhalation BID  . Netarsudil Dimesylate  1 drop Both Eyes QPM  . pantoprazole (PROTONIX) IV  40 mg Intravenous Q12H  . potassium chloride  20 mEq Oral BID  . sertraline  25 mg Oral Daily   Continuous: . sodium chloride Stopped (07/04/19 0453)   [id number] **OR** acetaminophen, albuterol, ALPRAZolam, hydrALAZINE, ondansetron **OR** ondansetron (ZOFRAN) IV  Assesment: She was admitted with hyponatremia.  I think she was dehydrated and had not been eating.  Her sodium level has improved.  She has hypertension which is not controlled she is off her diuretic which did seem to help with her blood pressure  She has asthma/COPD which is stable  She has had trouble with severe anxiety and had grief but I think now is clinically depressed  She had abdominal discomfort and poor appetite and was not able to eat  She is hypokalemic this  morning Active Problems:   Acute hyponatremia    Plan: Increase clonidine.  Continue other treatments.  Add Zoloft.  Replace her potassium    LOS: 2 days   Stephanie West 07/04/2019, 8:46 AM

## 2019-07-04 NOTE — Care Management Important Message (Signed)
Important Message  Patient Details  Name: Stephanie West MRN: [id number] Date of Birth: [birth year]   Medicare Important Message Given:  Yes     Tommy Medal 07/04/2019, 1:00 PM

## 2019-07-04 NOTE — Progress Notes (Signed)
  Subjective:  Patient feels much better.  She states her blood pressure was elevated last night and and she responded to antihypertensive.  She states her blood pressure this afternoon was normal.  She says nauseous gone away completely.  She has noted postprandial fullness.  She believes her stomach has contracted because she has not had regular meals for a while.  She denies epigastric pain.  She has had small bowel movement last night.  She takes Colace at home  Objective: Blood pressure 108/61, pulse 66, temperature 97.6 F (36.4 C), resp. rate 18, height _0  (1.6 m), weight 52 kg, SpO2 95 %. Patient is alert and in no acute distress.  Labs/studies Results:  CBC Latest Ref Rng & Units 07/04/2019 07/03/2019 07/02/2019  WBC 4.0 - 10.5 K/uL 5.7 5.9 5.7  Hemoglobin 12.0 - 15.0 g/dL 12.8 12.6 15.0  Hematocrit 36.0 - 46.0 % 37.1 35.6(L) 42.3  Platelets 150 - 400 K/uL 212 216 269    CMP Latest Ref Rng & Units 07/04/2019 07/03/2019 07/02/2019  Glucose 70 - 99 mg/dL 109(H) 107(H) 130(H)  BUN 8 - 23 mg/dL _1 Creatinine 0.44 - 1.00 mg/dL 0.97 0.93 0.92  Sodium 135 - 145 mmol/L 128(L) 120(L) 118(LL)  Potassium 3.5 - 5.1 mmol/L 3.0(L) 3.6 3.5  Chloride 98 - 111 mmol/L 98 92(L) 85(L)  CO2 22 - 32 mmol/L _2 Calcium 8.9 - 10.3 mg/dL 8.1(L) 7.8(L) 8.4(L)  Total Protein 6.5 - 8.1 g/dL - 5.9(L) -  Total Bilirubin 0.3 - 1.2 mg/dL - 1.0 -  Alkaline Phos 38 - 126 U/L - 61 -  AST 15 - 41 U/L - 14(L) -  ALT 0 - 44 U/L - 12 -    Hepatic Function Latest Ref Rng & Units 07/03/2019 07/02/2019 02/17/2016  Total Protein 6.5 - 8.1 g/dL 5.9(L) 7.7 7.1  Albumin 3.5 - 5.0 g/dL 3.1(L) 4.2 3.7  AST 15 - 41 U/L 14(L) 17 16  ALT 0 - 44 U/L _3 Alk Phosphatase 38 - 126 U/L 61 79 62  Total Bilirubin 0.3 - 1.2 mg/dL 1.0 0.7 0.3  Bilirubin, Direct 0.0 - 0.3 mg/dL - - -    Fasting cortisol level is 10.9.  Assessment:  #1.  Nausea most likely secondary to hyponatremia.  Nausea has resolved with  improvement in hyponatremia.  #2.  Epigastric pain.  Patient is on PPI and not having pain anymore.  Unless pain recurs will not proceed with further testing.  #3.  Hyponatremia.  She has responded nicely to IV normal saline.  Serum sodium is up to 128.  Fasting cortisol level is normal.  Hyponatremia may well be due to diuretic therapy.  #4.  Depression secondary to loss of her husband earlier this year.   Recommendations:  Continue p.o. pantoprazole. Colace 100 mg p.o. nightly.

## 2019-07-05 LAB — BASIC METABOLIC PANEL WITH GFR
Anion gap: 6 (ref 5–15)
BUN: 10 mg/dL (ref 8–23)
CO2: 22 mmol/L (ref 22–32)
Calcium: 8.2 mg/dL — ABNORMAL LOW (ref 8.9–10.3)
Chloride: 103 mmol/L (ref 98–111)
Creatinine, Ser: 0.97 mg/dL (ref 0.44–1.00)
GFR calc Af Amer: >60 mL/min
GFR calc non Af Amer: 55 mL/min — ABNORMAL LOW
Glucose, Bld: 113 mg/dL — ABNORMAL HIGH (ref 70–99)
Potassium: 4.3 mmol/L (ref 3.5–5.1)
Sodium: 131 mmol/L — ABNORMAL LOW (ref 135–145)

## 2019-07-05 MED ORDER — METOCLOPRAMIDE HCL 10 MG PO TABS
5.0000 mg | ORAL_TABLET | Freq: Three times a day (TID) | ORAL | Status: DC
  Administered 2019-07-05 (×3): 5 mg via ORAL
  Filled 2019-07-05 (×4): qty 1

## 2019-07-05 NOTE — Progress Notes (Signed)
Patients BP is 184/80. 10 mg IV Hydralazine given. Will continue to monitor.

## 2019-07-05 NOTE — Plan of Care (Signed)

## 2019-07-05 NOTE — Progress Notes (Signed)
Subjective: She does not feel as well as yesterday.  She has more nausea.  She was initially on Reglan when she came into the hospital and this is been stopped and she feels like it helped.  Blood pressure control has improved  Objective: Vital signs in last 24 hours: Temp:  [97.6 F (36.4 C)-98 F (36.7 C)] 97.8 F (36.6 C) (09/12 0512) Pulse Rate:  [66-75] 75 (09/12 0512) Resp:  [17-18] 17 (09/12 0512) BP: (108-175)/(61-73) 134/73 (09/12 0512) SpO2:  [93 %-95 %] 94 % (09/12 0739) Weight change:  Last BM Date: 06/30/19  Intake/Output from previous day: 09/11 0701 - 09/12 0700 In: 360 [P.O.:360] Out: 1050 [Urine:1050]  PHYSICAL EXAM General appearance: alert, cooperative and Anxious Resp: clear to auscultation bilaterally Cardio: regular rate and rhythm, S1, S2 normal, no murmur, click, rub or gallop GI: soft, non-tender; bowel sounds normal; no masses,  no organomegaly Extremities: extremities normal, atraumatic, no cyanosis or edema  Lab Results:  Results for orders placed or performed during the hospital encounter of 07/02/19 (from the past 48 hour(s))  Basic metabolic panel     Status: Abnormal   Collection Time: 07/04/19  4:30 AM  Result Value Ref Range   Sodium 128 (L) 135 - 145 mmol/L    Comment: DELTA CHECK NOTED   Potassium 3.0 (L) 3.5 - 5.1 mmol/L   Chloride 98 98 - 111 mmol/L   CO2 23 22 - 32 mmol/L   Glucose, Bld 109 (H) 70 - 99 mg/dL   BUN 9 8 - 23 mg/dL   Creatinine, Ser 0.97 0.44 - 1.00 mg/dL   Calcium 8.1 (L) 8.9 - 10.3 mg/dL   GFR calc non Af Amer 55 (L) >60 mL/min   GFR calc Af Amer >60 >60 mL/min   Anion gap 7 5 - 15    Comment: Performed at [facility], [address] [place], [place] [address]  CBC with Differential/Platelet     Status: None   Collection Time: 07/04/19  4:30 AM  Result Value Ref Range   WBC 5.7 4.0 - 10.5 K/uL   RBC 4.04 3.87 - 5.11 MIL/uL   Hemoglobin 12.8 12.0 - 15.0 g/dL   HCT 37.1 36.0 - 46.0 %   MCV 91.8 80.0 - 100.0 fL    MCH 31.7 26.0 - 34.0 pg   MCHC 34.5 30.0 - 36.0 g/dL   RDW 12.1 11.5 - 15.5 %   Platelets 212 150 - 400 K/uL   nRBC 0.0 0.0 - 0.2 %   Neutrophils Relative % 61 %   Neutro Abs 3.5 1.7 - 7.7 K/uL   Lymphocytes Relative 23 %   Lymphs Abs 1.3 0.7 - 4.0 K/uL   Monocytes Relative 13 %   Monocytes Absolute 0.7 0.1 - 1.0 K/uL   Eosinophils Relative 2 %   Eosinophils Absolute 0.1 0.0 - 0.5 K/uL   Basophils Relative 1 %   Basophils Absolute 0.0 0.0 - 0.1 K/uL   Immature Granulocytes 0 %   Abs Immature Granulocytes 0.02 0.00 - 0.07 K/uL    Comment: Performed at [facility], [address] [place], [place] [address]  Cortisol     Status: None   Collection Time: 07/04/19  4:30 AM  Result Value Ref Range   Cortisol, Plasma 10.9 ug/dL    Comment: (NOTE) AM    6.7 - 22.6 ug/dL PM   <10.0       ug/dL Performed at [facility] [place],  [place] [id number]   Basic metabolic panel     Status: Abnormal   Collection Time: 07/05/19  7:21 AM  Result Value Ref Range   Sodium 131 (L) 135 - 145 mmol/L   Potassium 4.3 3.5 - 5.1 mmol/L    Comment: DELTA CHECK NOTED   Chloride 103 98 - 111 mmol/L   CO2 22 22 - 32 mmol/L   Glucose, Bld 113 (H) 70 - 99 mg/dL   BUN 10 8 - 23 mg/dL   Creatinine, Ser 0.97 0.44 - 1.00 mg/dL   Calcium 8.2 (L) 8.9 - 10.3 mg/dL   GFR calc non Af Amer 55 (L) >60 mL/min   GFR calc Af Amer >60 >60 mL/min   Anion gap 6 5 - 15    Comment: Performed at [facility], [address], [place], [place] [address]    ABGS No results for input(s): PHART, PO2ART, TCO2, HCO3 in the last 72 hours.  Invalid input(s): PCO2 CULTURES Recent Results (from the past 240 hour(s))  SARS Coronavirus 2 [facility] order, Performed in [facility] hospital lab) Nasopharyngeal Nasopharyngeal Swab     Status: None   Collection Time: 07/02/19  8:52 AM   Specimen: Nasopharyngeal Swab  Result Value Ref Range Status   SARS Coronavirus 2 NEGATIVE NEGATIVE Final    Comment:  (NOTE) If result is NEGATIVE SARS-CoV-2 target nucleic acids are NOT DETECTED. The SARS-CoV-2 RNA is generally detectable in upper and lower  respiratory specimens during the acute phase of infection. The lowest  concentration of SARS-CoV-2 viral copies this assay can detect is 250  copies / mL. A negative result does not preclude SARS-CoV-2 infection  and should not be used as the sole basis for treatment or other  patient management decisions.  A negative result may occur with  improper specimen collection / handling, submission of specimen other  than nasopharyngeal swab, presence of viral mutation(s) within the  areas targeted by this assay, and inadequate number of viral copies  (<250 copies / mL). A negative result must be combined with clinical  observations, patient history, and epidemiological information. If result is POSITIVE SARS-CoV-2 target nucleic acids are DETECTED. The SARS-CoV-2 RNA is generally detectable in upper and lower  respiratory specimens dur ing the acute phase of infection.  Positive  results are indicative of active infection with SARS-CoV-2.  Clinical  correlation with patient history and other diagnostic information is  necessary to determine patient infection status.  Positive results do  not rule out bacterial infection or co-infection with other viruses. If result is PRESUMPTIVE POSTIVE SARS-CoV-2 nucleic acids MAY BE PRESENT.   A presumptive positive result was obtained on the submitted specimen  and confirmed on repeat testing.  While 2019 novel coronavirus  (SARS-CoV-2) nucleic acids may be present in the submitted sample  additional confirmatory testing may be necessary for epidemiological  and / or clinical management purposes  to differentiate between  SARS-CoV-2 and other Sarbecovirus currently known to infect humans.  If clinically indicated additional testing with an alternate test  methodology [phone]) is advised. The SARS-CoV-2 RNA is  generally  detectable in upper and lower respiratory sp ecimens during the acute  phase of infection. The expected result is Negative. Fact Sheet for Patients:  StrictlyIdeas.no Fact Sheet for Healthcare Providers: BankingDealers.co.za This test is not yet approved or cleared by the [place] FDA and has been authorized for detection and/or diagnosis of SARS-CoV-2 by FDA under an Emergency Use Authorization (EUA).  This EUA will  remain in effect (meaning this test can be used) for the duration of the COVID-19 declaration under Section 564(b)(1) of the Act, 21 U.S.C. section 360bbb-3(b)(1), unless the authorization is terminated or revoked sooner. Performed at [facility], [address], [place], [place] [address]    Studies/Results: No results found.  Medications:  Prior to Admission:  Medications Prior to Admission  Medication Sig Dispense Refill Last Dose  . albuterol (PROVENTIL HFA;VENTOLIN HFA) 108 (90 BASE) MCG/ACT inhaler Inhale 2 puffs into the lungs every 6 (six) hours as needed for wheezing or shortness of breath.     . ALPRAZolam (XANAX) 0.25 MG tablet Take 1 tablet by mouth 2 (two) times daily as needed.     . cloNIDine (CATAPRES) 0.1 MG tablet Take 1 tablet by mouth 2 (two) times daily.   07/01/2019 at 2300  . Cyanocobalamin (VITAMIN B 12 PO) Take 2,000 mcg by mouth daily.   07/01/2019 at Unknown time  . diltiazem (CARDIZEM CD) 360 MG 24 hr capsule Take 360 mg by mouth daily.    07/01/2019 at 2300  . ezetimibe (ZETIA) 10 MG tablet Take 10 mg by mouth daily.   07/01/2019 at 2300  . fish oil-omega-3 fatty acids 1000 MG capsule Take 1 g by mouth 2 (two) times daily.    07/01/2019 at Unknown time  . Fluticasone-Salmeterol (ADVAIR) 250-50 MCG/DOSE AEPB Inhale 1 puff into the lungs every 12 (twelve) hours.    07/01/2019 at 2300  . guanFACINE (TENEX) 1 MG tablet Take 1 mg by mouth at bedtime.     07/01/2019 at 2300  . hydrochlorothiazide  (MICROZIDE) 12.5 MG capsule Take 1 capsule by mouth daily.   07/02/2019 at 0730  . loteprednol (LOTEMAX) 0.2 % SUSP Place 1 drop into both eyes daily.    07/01/2019 at Unknown time  . multivitamin-iron-minerals-folic acid (CENTRUM) chewable tablet Chew 1 tablet by mouth daily.   07/01/2019 at 0730  . Netarsudil Dimesylate 0.02 % SOLN Place 1 drop into both eyes every evening.   07/01/2019 at Unknown time  . olmesartan (BENICAR) 40 MG tablet Take 1 tablet by mouth daily.   07/02/2019 at 0730  . pantoprazole (PROTONIX) 40 MG tablet Take 40 mg by mouth daily.   07/02/2019 at 0730  . potassium chloride (K-DUR) 10 MEQ tablet Take 10 mEq by mouth 3 (three) times daily.    07/02/2019 at 0730  . sucralfate (CARAFATE) 1 g tablet Take 1 tablet by mouth 4 (four) times daily as needed.    Past Week at Unknown time   Scheduled: . cloNIDine  0.2 mg Oral BID  . diltiazem  360 mg Oral Daily  . docusate sodium  100 mg Oral QHS  . enoxaparin (LOVENOX) injection  40 mg Subcutaneous Q24H  . feeding supplement  1 Container Oral TID BM  . guanFACINE  1 mg Oral QHS  . irbesartan  300 mg Oral Daily  . loteprednol  1 drop Both Eyes Daily  . metoCLOPramide  5 mg Oral TID AC & HS  . mometasone-formoterol  2 puff Inhalation BID  . Netarsudil Dimesylate  1 drop Both Eyes QPM  . pantoprazole (PROTONIX) IV  40 mg Intravenous Q12H  . potassium chloride  20 mEq Oral BID  . sertraline  25 mg Oral Daily   Continuous: . sodium chloride 75 mL/hr at 07/05/19 0102   [id number] **OR** acetaminophen, albuterol, ALPRAZolam, hydrALAZINE, ondansetron **OR** ondansetron (ZOFRAN) IV  Assesment: She was admitted with acute hyponatremia.  She was dehydrated.  She been having nausea poor oral intake and was continuing to take her diuretic.  She improved on Reglan and since it is been discontinued she is having more trouble again.  Sodium levels up to 131 now  She had significant issues with her blood pressure and that is better.  She has  asthma/COPD at baseline which is stable  She has generalized anxiety at baseline and it is worse since she is been in the hospital  She has been grieving over the death of her husband but I think she has clinical depression now and has been started on medication  She has reflux at baseline Active Problems:   Acute hyponatremia    Plan: Restart Reglan p.o. lower dose.  We will need to watch her blood pressure carefully.  Decrease IV fluids.    LOS: 3 days   Alonza Bogus 07/05/2019, 8:48 AM

## 2019-07-05 NOTE — Progress Notes (Signed)
  Subjective:  Patient states she woke up nauseated this morning. She says Dr. Luan Pulling has placed her back on metoclopramide and she would rather take this medication and suffer the side effects and unable to deal with nausea.  Ports fullness in epigastric region but no epigastric pain.  She had small bowel movement yesterday.  Objective: Blood pressure (!) 199/85, pulse 86, temperature 98.2 F (36.8 C), temperature source Oral, resp. rate 16, height _0  (1.6 m), weight 52 kg, SpO2 98 %. Patient is alert and in no acute distress. Cardiac exam with regular rhythm normal S1 and S2.  No murmur gallop noted. Auscultation lungs reveal vesicular breath sounds bilaterally. Abdomen is full with normal bowel sounds.  On palpation is soft and nontender with organomegaly or masses.  Labs/studies Results:  CMP Latest Ref Rng & Units 07/05/2019 07/04/2019 07/03/2019  Glucose 70 - 99 mg/dL 113(H) 109(H) 107(H)  BUN 8 - 23 mg/dL _1 Creatinine 0.44 - 1.00 mg/dL 0.97 0.97 0.93  Sodium 135 - 145 mmol/L 131(L) 128(L) 120(L)  Potassium 3.5 - 5.1 mmol/L 4.3 3.0(L) 3.6  Chloride 98 - 111 mmol/L 103 98 92(L)  CO2 22 - 32 mmol/L _2 Calcium 8.9 - 10.3 mg/dL 8.2(L) 8.1(L) 7.8(L)  Total Protein 6.5 - 8.1 g/dL - - 5.9(L)  Total Bilirubin 0.3 - 1.2 mg/dL - - 1.0  Alkaline Phos 38 - 126 U/L - - 61  AST 15 - 41 U/L - - 14(L)  ALT 0 - 44 U/L - - 12    Hepatic Function Latest Ref Rng & Units 07/03/2019 07/02/2019 02/17/2016  Total Protein 6.5 - 8.1 g/dL 5.9(L) 7.7 7.1  Albumin 3.5 - 5.0 g/dL 3.1(L) 4.2 3.7  AST 15 - 41 U/L 14(L) 17 16  ALT 0 - 44 U/L _3 Alk Phosphatase 38 - 126 U/L 61 79 62  Total Bilirubin 0.3 - 1.2 mg/dL 1.0 0.7 0.3  Bilirubin, Direct 0.0 - 0.3 mg/dL - - -      Assessment:  #1.  Nausea.  Nausea relapse of metoclopramide and she is back on metoclopramide at low dose.  Will have to watch closely for side effects.  May be able to reduce dose prior to discharge or later date or  date.  #2.  Hyponatremia.  Serum sodium is now up to 131.  IV fluid has been discontinued.  Oral intake still not satisfactory but improving.  Hyponatremia presumed to be due to diuretic therapy.  Electrolytes will need to be monitored closely over the next few weeks.  #3.  GERD.  Typical symptoms are well controlled with therapy.  #4.  Depression.  Is on sertraline.   Recommendations  Continue PPI and low-dose metoclopramide for now.

## 2019-07-05 NOTE — Progress Notes (Signed)
Patients BP 222/104. Heart rate 103. Mews score of 3. Mid Level notified. Scheduled meds given.

## 2019-07-06 LAB — BASIC METABOLIC PANEL
Anion gap: 10 (ref 5–15)
BUN: 11 mg/dL (ref 8–23)
CO2: 22 mmol/L (ref 22–32)
Calcium: 8.6 mg/dL — ABNORMAL LOW (ref 8.9–10.3)
Chloride: 96 mmol/L — ABNORMAL LOW (ref 98–111)
Creatinine, Ser: 0.89 mg/dL (ref 0.44–1.00)
GFR calc Af Amer: >60 mL/min (ref >60)
GFR calc non Af Amer: >60 mL/min (ref >60)
Glucose, Bld: 123 mg/dL — ABNORMAL HIGH (ref 70–99)
Potassium: 4.1 mmol/L (ref 3.5–5.1)
Sodium: 128 mmol/L — ABNORMAL LOW (ref 135–145)

## 2019-07-06 NOTE — Progress Notes (Signed)
B/P remains elevated, not time for prn hydralizine. Primary nurse A. Aggie Moats, RN notified for further interventions. Pt anxious regarding B/P, offered reassurance and discussed relaxation techniques.

## 2019-07-06 NOTE — Progress Notes (Signed)
Subjective: She had more trouble with her blood pressure last night.  I believe this is a side effect from Reglan.  She does have hypertension at baseline but it seems that her blood pressure gets much higher when she takes the Reglan.  She says her stomach is doing fairly well  Objective: Vital signs in last 24 hours: Temp:  [98.2 F (36.8 C)-98.4 F (36.9 C)] 98.4 F (36.9 C) (09/13 0541) Pulse Rate:  [76-103] 87 (09/13 0608) Resp:  [15-16] 15 (09/13 0541) BP: (153-222)/(63-104) 188/64 (09/13 0608) SpO2:  [94 %-98 %] 97 % (09/13 0721) Weight change:  Last BM Date: 06/30/19  Intake/Output from previous day: 09/12 0701 - 09/13 0700 In: 1160 [P.O.:1080; I.V.:80] Out: 1800 [Urine:1800]  PHYSICAL EXAM General appearance: alert, cooperative and no distress Resp: clear to auscultation bilaterally Cardio: regular rate and rhythm, S1, S2 normal, no murmur, click, rub or gallop GI: soft, non-tender; bowel sounds normal; no masses,  no organomegaly Extremities: extremities normal, atraumatic, no cyanosis or edema  Lab Results:  Results for orders placed or performed during the hospital encounter of 07/02/19 (from the past 48 hour(s))  Basic metabolic panel     Status: Abnormal   Collection Time: 07/05/19  7:21 AM  Result Value Ref Range   Sodium 131 (L) 135 - 145 mmol/L   Potassium 4.3 3.5 - 5.1 mmol/L    Comment: DELTA CHECK NOTED   Chloride 103 98 - 111 mmol/L   CO2 22 22 - 32 mmol/L   Glucose, Bld 113 (H) 70 - 99 mg/dL   BUN 10 8 - 23 mg/dL   Creatinine, Ser 0.97 0.44 - 1.00 mg/dL   Calcium 8.2 (L) 8.9 - 10.3 mg/dL   GFR calc non Af Amer 55 (L) >60 mL/min   GFR calc Af Amer >60 >60 mL/min   Anion gap 6 5 - 15    Comment: Performed at [facility], [address] [place], [place] [id number]  Basic metabolic panel     Status: Abnormal   Collection Time: 07/06/19  6:05 AM  Result Value Ref Range   Sodium 128 (L) 135 - 145 mmol/L   Potassium 4.1 3.5 - 5.1 mmol/L   Chloride 96  (L) 98 - 111 mmol/L   CO2 22 22 - 32 mmol/L   Glucose, Bld 123 (H) 70 - 99 mg/dL   BUN 11 8 - 23 mg/dL   Creatinine, Ser 0.89 0.44 - 1.00 mg/dL   Calcium 8.6 (L) 8.9 - 10.3 mg/dL   GFR calc non Af Amer >60 >60 mL/min   GFR calc Af Amer >60 >60 mL/min   Anion gap 10 5 - 15    Comment: Performed at [facility], [address], [place], [place] [address]    ABGS No results for input(s): PHART, PO2ART, TCO2, HCO3 in the last 72 hours.  Invalid input(s): PCO2 CULTURES Recent Results (from the past 240 hour(s))  SARS Coronavirus 2 [facility] order, Performed in [facility] hospital lab) Nasopharyngeal Nasopharyngeal Swab     Status: None   Collection Time: 07/02/19  8:52 AM   Specimen: Nasopharyngeal Swab  Result Value Ref Range Status   SARS Coronavirus 2 NEGATIVE NEGATIVE Final    Comment: (NOTE) If result is NEGATIVE SARS-CoV-2 target nucleic acids are NOT DETECTED. The SARS-CoV-2 RNA is generally detectable in upper and lower  respiratory specimens during the acute phase of infection. The lowest  concentration of SARS-CoV-2 viral copies this assay can detect is 250  copies / mL. A negative result does not preclude SARS-CoV-2 infection  and should not be used as the sole basis for treatment or other  patient management decisions.  A negative result may occur with  improper specimen collection / handling, submission of specimen other  than nasopharyngeal swab, presence of viral mutation(s) within the  areas targeted by this assay, and inadequate number of viral copies  (<250 copies / mL). A negative result must be combined with clinical  observations, patient history, and epidemiological information. If result is POSITIVE SARS-CoV-2 target nucleic acids are DETECTED. The SARS-CoV-2 RNA is generally detectable in upper and lower  respiratory specimens dur ing the acute phase of infection.  Positive  results are indicative of active infection with SARS-CoV-2.  Clinical   correlation with patient history and other diagnostic information is  necessary to determine patient infection status.  Positive results do  not rule out bacterial infection or co-infection with other viruses. If result is PRESUMPTIVE POSTIVE SARS-CoV-2 nucleic acids MAY BE PRESENT.   A presumptive positive result was obtained on the submitted specimen  and confirmed on repeat testing.  While 2019 novel coronavirus  (SARS-CoV-2) nucleic acids may be present in the submitted sample  additional confirmatory testing may be necessary for epidemiological  and / or clinical management purposes  to differentiate between  SARS-CoV-2 and other Sarbecovirus currently known to infect humans.  If clinically indicated additional testing with an alternate test  methodology [phone]) is advised. The SARS-CoV-2 RNA is generally  detectable in upper and lower respiratory sp ecimens during the acute  phase of infection. The expected result is Negative. Fact Sheet for Patients:  StrictlyIdeas.no Fact Sheet for Healthcare Providers: BankingDealers.co.za This test is not yet approved or cleared by the [place] FDA and has been authorized for detection and/or diagnosis of SARS-CoV-2 by FDA under an Emergency Use Authorization (EUA).  This EUA will remain in effect (meaning this test can be used) for the duration of the COVID-19 declaration under Section 564(b)(1) of the Act, 21 U.S.C. section 360bbb-3(b)(1), unless the authorization is terminated or revoked sooner. Performed at [facility], [address], [place], [place] [address]    Studies/Results: No results found.  Medications:  Prior to Admission:  Medications Prior to Admission  Medication Sig Dispense Refill Last Dose  . albuterol (PROVENTIL HFA;VENTOLIN HFA) 108 (90 BASE) MCG/ACT inhaler Inhale 2 puffs into the lungs every 6 (six) hours as needed for wheezing or shortness of breath.     .  ALPRAZolam (XANAX) 0.25 MG tablet Take 1 tablet by mouth 2 (two) times daily as needed.     . cloNIDine (CATAPRES) 0.1 MG tablet Take 1 tablet by mouth 2 (two) times daily.   07/01/2019 at 2300  . Cyanocobalamin (VITAMIN B 12 PO) Take 2,000 mcg by mouth daily.   07/01/2019 at Unknown time  . diltiazem (CARDIZEM CD) 360 MG 24 hr capsule Take 360 mg by mouth daily.    07/01/2019 at 2300  . ezetimibe (ZETIA) 10 MG tablet Take 10 mg by mouth daily.   07/01/2019 at 2300  . fish oil-omega-3 fatty acids 1000 MG capsule Take 1 g by mouth 2 (two) times daily.    07/01/2019 at Unknown time  . Fluticasone-Salmeterol (ADVAIR) 250-50 MCG/DOSE AEPB Inhale 1 puff into the lungs every 12 (twelve) hours.    07/01/2019 at 2300  . guanFACINE (TENEX) 1 MG tablet Take 1 mg by mouth at bedtime.     07/01/2019 at 2300  .  hydrochlorothiazide (MICROZIDE) 12.5 MG capsule Take 1 capsule by mouth daily.   07/02/2019 at 0730  . loteprednol (LOTEMAX) 0.2 % SUSP Place 1 drop into both eyes daily.    07/01/2019 at Unknown time  . multivitamin-iron-minerals-folic acid (CENTRUM) chewable tablet Chew 1 tablet by mouth daily.   07/01/2019 at 0730  . Netarsudil Dimesylate 0.02 % SOLN Place 1 drop into both eyes every evening.   07/01/2019 at Unknown time  . olmesartan (BENICAR) 40 MG tablet Take 1 tablet by mouth daily.   07/02/2019 at 0730  . pantoprazole (PROTONIX) 40 MG tablet Take 40 mg by mouth daily.   07/02/2019 at 0730  . potassium chloride (K-DUR) 10 MEQ tablet Take 10 mEq by mouth 3 (three) times daily.    07/02/2019 at 0730  . sucralfate (CARAFATE) 1 g tablet Take 1 tablet by mouth 4 (four) times daily as needed.    Past Week at Unknown time   Scheduled: . cloNIDine  0.2 mg Oral BID  . diltiazem  360 mg Oral Daily  . docusate sodium  100 mg Oral QHS  . enoxaparin (LOVENOX) injection  40 mg Subcutaneous Q24H  . feeding supplement  1 Container Oral TID BM  . guanFACINE  1 mg Oral QHS  . irbesartan  300 mg Oral Daily  . loteprednol  1 drop Both  Eyes Daily  . mometasone-formoterol  2 puff Inhalation BID  . Netarsudil Dimesylate  1 drop Both Eyes QPM  . pantoprazole (PROTONIX) IV  40 mg Intravenous Q12H  . potassium chloride  20 mEq Oral BID  . sertraline  25 mg Oral Daily   Continuous: . sodium chloride 10 mL/hr at 07/05/19 [id number]   [id number] **OR** acetaminophen, albuterol, ALPRAZolam, hydrALAZINE, ondansetron **OR** ondansetron (ZOFRAN) IV  Assesment: She was admitted with hyponatremia.  She has had nausea without vomiting and she has had dehydration.  She has received IV fluids and she is doing better.  Her sodium level is a little bit lower today at 128 but substantially better than her 118 she had previously.  She has had trouble with hypertension and I think that may be a side effect of her Reglan and that will be discontinued  She has generalized anxiety which is a little bit worse on Reglan  She has asthma/COPD which is stable Active Problems:   GERD (gastroesophageal reflux disease)   HTN (hypertension)   Nausea without vomiting   Generalized anxiety disorder   Acute hyponatremia    Plan: Discontinue Reglan.  Watch blood pressure.  Continue other treatments.  I did not modify her blood pressure medication now.    LOS: 4 days   Alonza Bogus 07/06/2019, 8:05 AM

## 2019-07-07 LAB — BASIC METABOLIC PANEL
Anion gap: 7 (ref 5–15)
BUN: 13 mg/dL (ref 8–23)
CO2: 24 mmol/L (ref 22–32)
Calcium: 8.6 mg/dL — ABNORMAL LOW (ref 8.9–10.3)
Chloride: 96 mmol/L — ABNORMAL LOW (ref 98–111)
Creatinine, Ser: 0.88 mg/dL (ref 0.44–1.00)
GFR calc Af Amer: >60 mL/min (ref >60)
GFR calc non Af Amer: >60 mL/min (ref >60)
Glucose, Bld: 103 mg/dL — ABNORMAL HIGH (ref 70–99)
Potassium: 4.6 mmol/L (ref 3.5–5.1)
Sodium: 127 mmol/L — ABNORMAL LOW (ref 135–145)

## 2019-07-07 MED ORDER — SERTRALINE HCL 50 MG PO TABS: 50.0000 mg | ORAL_TABLET | Freq: Every day | ORAL | 12 refills | Status: DC

## 2019-07-07 MED ORDER — CLONIDINE HCL 0.2 MG PO TABS
0.3000 mg | ORAL_TABLET | Freq: Two times a day (BID) | ORAL | Status: DC
  Administered 2019-07-07: 09:00:00 0.3 mg via ORAL
  Filled 2019-07-07: qty 1

## 2019-07-07 MED ORDER — CLONIDINE HCL 0.1 MG PO TABS: 0.2000 mg | ORAL_TABLET | Freq: Three times a day (TID) | ORAL | 11 refills | Status: DC

## 2019-07-07 MED ORDER — ONDANSETRON HCL 4 MG PO TABS: 4.0000 mg | ORAL_TABLET | Freq: Four times a day (QID) | ORAL | 2 refills | Status: DC | PRN

## 2019-07-07 NOTE — Progress Notes (Signed)
Subjective: She says she feels better.  She is not having any nausea.  She does not have any other new complaints.  Her blood pressure has still been up and she feels like part of the problem is that she is very anxious about being in the hospital and she has had a significant issue with white coat hypertension in the past.  No abdominal pain.  She wants to go home  Objective: Vital signs in last 24 hours: Temp:  [98.3 F (36.8 C)-98.4 F (36.9 C)] 98.3 F (36.8 C) (09/14 0502) Pulse Rate:  [63-93] 84 (09/14 0502) Resp:  [14-16] 14 (09/13 1630) BP: (117-220)/(60-92) 179/65 (09/14 0502) SpO2:  [94 %-97 %] 94 % (09/14 0729) Weight change:  Last BM Date: 07/05/19  Intake/Output from previous day: 09/13 0701 - 09/14 0700 In: 600 [P.O.:600] Out: -   PHYSICAL EXAM General appearance: alert, cooperative and Anxious Resp: clear to auscultation bilaterally Cardio: regular rate and rhythm, S1, S2 normal, no murmur, click, rub or gallop GI: soft, non-tender; bowel sounds normal; no masses,  no organomegaly Extremities: extremities normal, atraumatic, no cyanosis or edema  Lab Results:  Results for orders placed or performed during the hospital encounter of 07/02/19 (from the past 48 hour(s))  Basic metabolic panel     Status: Abnormal   Collection Time: 07/06/19  6:05 AM  Result Value Ref Range   Sodium 128 (L) 135 - 145 mmol/L   Potassium 4.1 3.5 - 5.1 mmol/L   Chloride 96 (L) 98 - 111 mmol/L   CO2 22 22 - 32 mmol/L   Glucose, Bld 123 (H) 70 - 99 mg/dL   BUN 11 8 - 23 mg/dL   Creatinine, Ser 0.89 0.44 - 1.00 mg/dL   Calcium 8.6 (L) 8.9 - 10.3 mg/dL   GFR calc non Af Amer >60 >60 mL/min   GFR calc Af Amer >60 >60 mL/min   Anion gap 10 5 - 15    Comment: Performed at [facility], [address], [place], [place] [id number]  Basic metabolic panel     Status: Abnormal   Collection Time: 07/07/19  5:48 AM  Result Value Ref Range   Sodium 127 (L) 135 - 145 mmol/L   Potassium 4.6 3.5  - 5.1 mmol/L   Chloride 96 (L) 98 - 111 mmol/L   CO2 24 22 - 32 mmol/L   Glucose, Bld 103 (H) 70 - 99 mg/dL   BUN 13 8 - 23 mg/dL   Creatinine, Ser 0.88 0.44 - 1.00 mg/dL   Calcium 8.6 (L) 8.9 - 10.3 mg/dL   GFR calc non Af Amer >60 >60 mL/min   GFR calc Af Amer >60 >60 mL/min   Anion gap 7 5 - 15    Comment: Performed at [facility], [address] [place], [place] [address]    ABGS No results for input(s): PHART, PO2ART, TCO2, HCO3 in the last 72 hours.  Invalid input(s): PCO2 CULTURES Recent Results (from the past 240 hour(s))  SARS Coronavirus 2 [facility] order, Performed in [facility] hospital lab) Nasopharyngeal Nasopharyngeal Swab     Status: None   Collection Time: 07/02/19  8:52 AM   Specimen: Nasopharyngeal Swab  Result Value Ref Range Status   SARS Coronavirus 2 NEGATIVE NEGATIVE Final    Comment: (NOTE) If result is NEGATIVE SARS-CoV-2 target nucleic acids are NOT DETECTED. The SARS-CoV-2 RNA is generally detectable in upper and lower  respiratory specimens during the acute phase of infection. The lowest  concentration of SARS-CoV-2 viral copies this assay can detect is 250  copies / mL. A negative result does not preclude SARS-CoV-2 infection  and should not be used as the sole basis for treatment or other  patient management decisions.  A negative result may occur with  improper specimen collection / handling, submission of specimen other  than nasopharyngeal swab, presence of viral mutation(s) within the  areas targeted by this assay, and inadequate number of viral copies  (<250 copies / mL). A negative result must be combined with clinical  observations, patient history, and epidemiological information. If result is POSITIVE SARS-CoV-2 target nucleic acids are DETECTED. The SARS-CoV-2 RNA is generally detectable in upper and lower  respiratory specimens dur ing the acute phase of infection.  Positive  results are indicative of active infection with  SARS-CoV-2.  Clinical  correlation with patient history and other diagnostic information is  necessary to determine patient infection status.  Positive results do  not rule out bacterial infection or co-infection with other viruses. If result is PRESUMPTIVE POSTIVE SARS-CoV-2 nucleic acids MAY BE PRESENT.   A presumptive positive result was obtained on the submitted specimen  and confirmed on repeat testing.  While 2019 novel coronavirus  (SARS-CoV-2) nucleic acids may be present in the submitted sample  additional confirmatory testing may be necessary for epidemiological  and / or clinical management purposes  to differentiate between  SARS-CoV-2 and other Sarbecovirus currently known to infect humans.  If clinically indicated additional testing with an alternate test  methodology [id number]) is advised. The SARS-CoV-2 RNA is generally  detectable in upper and lower respiratory sp ecimens during the acute  phase of infection. The expected result is Negative. Fact Sheet for Patients:  StrictlyIdeas.no Fact Sheet for Healthcare Providers: BankingDealers.co.za This test is not yet approved or cleared by the [place] FDA and has been authorized for detection and/or diagnosis of SARS-CoV-2 by FDA under an Emergency Use Authorization (EUA).  This EUA will remain in effect (meaning this test can be used) for the duration of the COVID-19 declaration under Section 564(b)(1) of the Act, 21 U.S.C. section 360bbb-3(b)(1), unless the authorization is terminated or revoked sooner. Performed at [facility], [address], [place], [place] [address]    Studies/Results: No results found.  Medications:  Prior to Admission:  Medications Prior to Admission  Medication Sig Dispense Refill Last Dose  . albuterol (PROVENTIL HFA;VENTOLIN HFA) 108 (90 BASE) MCG/ACT inhaler Inhale 2 puffs into the lungs every 6 (six) hours as needed for wheezing or  shortness of breath.     . ALPRAZolam (XANAX) 0.25 MG tablet Take 1 tablet by mouth 2 (two) times daily as needed.     . Cyanocobalamin (VITAMIN B 12 PO) Take 2,000 mcg by mouth daily.   07/01/2019 at Unknown time  . diltiazem (CARDIZEM CD) 360 MG 24 hr capsule Take 360 mg by mouth daily.    07/01/2019 at 2300  . ezetimibe (ZETIA) 10 MG tablet Take 10 mg by mouth daily.   07/01/2019 at 2300  . fish oil-omega-3 fatty acids 1000 MG capsule Take 1 g by mouth 2 (two) times daily.    07/01/2019 at Unknown time  . Fluticasone-Salmeterol (ADVAIR) 250-50 MCG/DOSE AEPB Inhale 1 puff into the lungs every 12 (twelve) hours.    07/01/2019 at 2300  . guanFACINE (TENEX) 1 MG tablet Take 1 mg by mouth at bedtime.     07/01/2019 at 2300  . hydrochlorothiazide (MICROZIDE) 12.5 MG capsule Take 1  capsule by mouth daily.   07/02/2019 at 0730  . loteprednol (LOTEMAX) 0.2 % SUSP Place 1 drop into both eyes daily.    07/01/2019 at Unknown time  . multivitamin-iron-minerals-folic acid (CENTRUM) chewable tablet Chew 1 tablet by mouth daily.   07/01/2019 at 0730  . Netarsudil Dimesylate 0.02 % SOLN Place 1 drop into both eyes every evening.   07/01/2019 at Unknown time  . olmesartan (BENICAR) 40 MG tablet Take 1 tablet by mouth daily.   07/02/2019 at 0730  . pantoprazole (PROTONIX) 40 MG tablet Take 40 mg by mouth daily.   07/02/2019 at 0730  . potassium chloride (K-DUR) 10 MEQ tablet Take 10 mEq by mouth 3 (three) times daily.    07/02/2019 at 0730  . sucralfate (CARAFATE) 1 g tablet Take 1 tablet by mouth 4 (four) times daily as needed.    Past Week at Unknown time  . [DISCONTINUED] cloNIDine (CATAPRES) 0.1 MG tablet Take 1 tablet by mouth 2 (two) times daily.   07/01/2019 at 2300   Scheduled: . cloNIDine  0.3 mg Oral BID  . diltiazem  360 mg Oral Daily  . docusate sodium  100 mg Oral QHS  . enoxaparin (LOVENOX) injection  40 mg Subcutaneous Q24H  . feeding supplement  1 Container Oral TID BM  . guanFACINE  1 mg Oral QHS  . irbesartan  300 mg  Oral Daily  . loteprednol  1 drop Both Eyes Daily  . mometasone-formoterol  2 puff Inhalation BID  . Netarsudil Dimesylate  1 drop Both Eyes QPM  . pantoprazole (PROTONIX) IV  40 mg Intravenous Q12H  . potassium chloride  20 mEq Oral BID  . sertraline  25 mg Oral Daily   Continuous: . sodium chloride 10 mL/hr at 07/05/19 [id number]   [id number] **OR** acetaminophen, albuterol, ALPRAZolam, hydrALAZINE, ondansetron **OR** ondansetron (ZOFRAN) IV  Assesment: She was admitted with hyponatremia and her sodium was 127 now.  I think part of that was from her nausea and not eating well and remaining on her diuretic.  She had hypokalemia which has resolved  She has generalized anxiety which is still giving her trouble  She has had trouble with grief over the death of her husband but I believe she now has clinical depression and that is being treated  She has hypertension which is been difficult to control as an outpatient and I think it is worse in the hospital partially because of her anxiety.  I have increased her clonidine and will follow that closely at home Active Problems:   GERD (gastroesophageal reflux disease)   HTN (hypertension)   Nausea without vomiting   Generalized anxiety disorder   Acute hyponatremia    Plan: Discharge home today with close follow-up in my office    LOS: 5 days   Stephanie West 07/07/2019, 7:47 AM

## 2019-07-07 NOTE — Progress Notes (Signed)
IV removed by another nurse. Discharge papers reviewed with patient. Patient discharged via family vehicle.

## 2019-07-07 NOTE — Discharge Summary (Signed)
Physician Discharge Summary  Patient ID: Stephanie West MRN: [id number] DOB/AGE: [birth year] 80 y.o. Primary Care Physician:Brodee Mauritz, Percell Miller, MD Admit date: 07/02/2019 Discharge date: 07/07/2019    Discharge Diagnoses:   Active Problems:   GERD (gastroesophageal reflux disease)   HTN (hypertension)   Nausea without vomiting   Generalized anxiety disorder   Acute hyponatremia Depression COPD  Allergies as of 07/07/2019      Reactions   Apple Other (See Comments)   Wheezing and Asthma Symptoms    Aspirin Other (See Comments)   Wheezing, Asthma Symptoms    Avelox [moxifloxacin Hcl In Nacl] Hives   Pt can take cipro   Bee Venom Other (See Comments)   Wheezing, Asthma Symptoms   Benadryl [diphenhydramine Hcl] Other (See Comments)   Wheezing, Asthma Symptoms    Chicken Allergy Other (See Comments)   Wheezing and Asthma Symptoms    Codeine Other (See Comments)   Makes patient feel like she's going to pass out.    Factive [gemifloxacin Mesylate] Hives   Factive [gemifloxacin] Hives   Iodinated Diagnostic Agents Other (See Comments)   Pt was told by allergist to avoid  Dye due to other allergies   Iodine    Levaquin [levofloxacin In D5w] Hives   Pt can take cipro   Levofloxacin Hives   Penicillins Other (See Comments)   Asthma Symptoms .Did it involve swelling of the face/tongue/throat, SOB, or low BP? No Did it involve sudden or severe rash/hives, skin peeling, or any reaction on the inside of your mouth or nose? Yes Did you need to seek medical attention at a hospital or doctor's office? Unknown When did it last happen?Unkown If all above answers are "NO", may proceed with cephalosporin use.   Sulfa Antibiotics Nausea Only   Tequin [gatifloxacin] Hives      Medication List    STOP taking these medications   hydrochlorothiazide 12.5 MG capsule Commonly known as: MICROZIDE     TAKE these medications   albuterol 108 (90 Base) MCG/ACT inhaler Commonly known as:  VENTOLIN HFA Inhale 2 puffs into the lungs every 6 (six) hours as needed for wheezing or shortness of breath.   ALPRAZolam 0.25 MG tablet Commonly known as: XANAX Take 1 tablet by mouth 2 (two) times daily as needed.   cloNIDine 0.1 MG tablet Commonly known as: CATAPRES Take 2 tablets (0.2 mg total) by mouth 3 (three) times daily. When you run out of the supply you have I will send in the prescription for the larger dose What changed:   how much to take  when to take this  additional instructions   diltiazem 360 MG 24 hr capsule Commonly known as: CARDIZEM CD Take 360 mg by mouth daily.   ezetimibe 10 MG tablet Commonly known as: ZETIA Take 10 mg by mouth daily.   fish oil-omega-3 fatty acids 1000 MG capsule Take 1 g by mouth 2 (two) times daily.   Fluticasone-Salmeterol 250-50 MCG/DOSE Aepb Commonly known as: ADVAIR Inhale 1 puff into the lungs every 12 (twelve) hours.   guanFACINE 1 MG tablet Commonly known as: TENEX Take 1 mg by mouth at bedtime.   loteprednol 0.2 % Susp Commonly known as: LOTEMAX Place 1 drop into both eyes daily.   multivitamin-iron-minerals-folic acid chewable tablet Chew 1 tablet by mouth daily.   Netarsudil Dimesylate 0.02 % Soln Place 1 drop into both eyes every evening.   olmesartan 40 MG tablet Commonly known as: BENICAR Take 1 tablet by mouth daily.  ondansetron 4 MG tablet Commonly known as: ZOFRAN Take 1 tablet (4 mg total) by mouth every 6 (six) hours as needed for nausea.   pantoprazole 40 MG tablet Commonly known as: PROTONIX Take 40 mg by mouth daily.   potassium chloride 10 MEQ tablet Commonly known as: K-DUR Take 10 mEq by mouth 3 (three) times daily.   sertraline 50 MG tablet Commonly known as: ZOLOFT Take 1 tablet (50 mg total) by mouth daily.   sucralfate 1 g tablet Commonly known as: CARAFATE Take 1 tablet by mouth 4 (four) times daily as needed.   VITAMIN B 12 PO Take 2,000 mcg by mouth daily.        Discharged Condition: Improved    Consults: Gastroenterology, Dr. Laural Golden  Significant Diagnostic Studies: Dg Chest 2 View  Result Date: 07/02/2019 CLINICAL DATA:  Chest pain EXAM: CHEST - 2 VIEW COMPARISON:  10/25/2013, 03/14/2011 FINDINGS: Stable cardiomediastinal contours including prominence of the right hilar region, likely reflecting prominent vascularity seen on prior studies. Calcific aortic knob. Large retrocardiac hiatal hernia. No focal airspace consolidation. No pleural effusion. No pneumothorax. No acute osseous findings. IMPRESSION: 1. No acute cardiopulmonary findings. 2. Large hiatal hernia. Electronically Signed   By: Davina Poke M.D.   On: 07/02/2019 08:37   US Abdomen Limited Ruq  Result Date: 07/02/2019 CLINICAL DATA:  Right upper quadrant abdominal pain. EXAM: ULTRASOUND ABDOMEN LIMITED RIGHT UPPER QUADRANT COMPARISON:  CT scan of August 04, 2015. Ultrasound of July 08, 2015. FINDINGS: Gallbladder: No gallstones or wall thickening visualized. No sonographic Murphy sign noted by sonographer. Common bile duct: Diameter: 5 mm which is within normal limits. Liver: No focal lesion identified. Within normal limits in parenchymal echogenicity. Portal vein is patent on color Doppler imaging with normal direction of blood flow towards the liver. Other: None. IMPRESSION: No abnormality seen in the right upper quadrant of the abdomen. Electronically Signed   By: Marijo Conception M.D.   On: 07/02/2019 08:58    Lab Results: Basic Metabolic Panel: Recent Labs    07/06/19 0605 07/07/19 0548  NA 128* 127*  K 4.1 4.6  CL 96* 96*  CO2 22 24  GLUCOSE 123* 103*  BUN 11 13  CREATININE 0.89 0.88  CALCIUM 8.6* 8.6*   Liver Function Tests: No results for input(s): AST, ALT, ALKPHOS, BILITOT, PROT, ALBUMIN in the last 72 hours.   CBC: No results for input(s): WBC, NEUTROABS, HGB, HCT, MCV, PLT in the last 72 hours.  Recent Results (from the past 240 hour(s))  SARS  Coronavirus 2 [facility] order, Performed in [facility] hospital lab) Nasopharyngeal Nasopharyngeal Swab     Status: None   Collection Time: 07/02/19  8:52 AM   Specimen: Nasopharyngeal Swab  Result Value Ref Range Status   SARS Coronavirus 2 NEGATIVE NEGATIVE Final    Comment: (NOTE) If result is NEGATIVE SARS-CoV-2 target nucleic acids are NOT DETECTED. The SARS-CoV-2 RNA is generally detectable in upper and lower  respiratory specimens during the acute phase of infection. The lowest  concentration of SARS-CoV-2 viral copies this assay can detect is 250  copies / mL. A negative result does not preclude SARS-CoV-2 infection  and should not be used as the sole basis for treatment or other  patient management decisions.  A negative result may occur with  improper specimen collection / handling, submission of specimen other  than nasopharyngeal swab, presence of viral mutation(s) within the  areas targeted by this assay, and inadequate number of viral  copies  (<250 copies / mL). A negative result must be combined with clinical  observations, patient history, and epidemiological information. If result is POSITIVE SARS-CoV-2 target nucleic acids are DETECTED. The SARS-CoV-2 RNA is generally detectable in upper and lower  respiratory specimens dur ing the acute phase of infection.  Positive  results are indicative of active infection with SARS-CoV-2.  Clinical  correlation with patient history and other diagnostic information is  necessary to determine patient infection status.  Positive results do  not rule out bacterial infection or co-infection with other viruses. If result is PRESUMPTIVE POSTIVE SARS-CoV-2 nucleic acids MAY BE PRESENT.   A presumptive positive result was obtained on the submitted specimen  and confirmed on repeat testing.  While 2019 novel coronavirus  (SARS-CoV-2) nucleic acids may be present in the submitted sample  additional confirmatory testing may be necessary  for epidemiological  and / or clinical management purposes  to differentiate between  SARS-CoV-2 and other Sarbecovirus currently known to infect humans.  If clinically indicated additional testing with an alternate test  methodology [id number]) is advised. The SARS-CoV-2 RNA is generally  detectable in upper and lower respiratory sp ecimens during the acute  phase of infection. The expected result is Negative. Fact Sheet for Patients:  StrictlyIdeas.no Fact Sheet for Healthcare Providers: BankingDealers.co.za This test is not yet approved or cleared by the [place] FDA and has been authorized for detection and/or diagnosis of SARS-CoV-2 by FDA under an Emergency Use Authorization (EUA).  This EUA will remain in effect (meaning this test can be used) for the duration of the COVID-19 declaration under Section 564(b)(1) of the Act, 21 U.S.C. section 360bbb-3(b)(1), unless the authorization is terminated or revoked sooner. Performed at [facility], [address], [place],  [address]      Hospital Course: This is an 79 year old who came to the emergency department because of nausea and epigastric abdominal pain.  She was found to be markedly hyponatremic.  She had not been eating well and she had continued to take her hydrochlorothiazide.  She felt weak.  She was treated with IV fluids and her sodium improved and was 127 at the time of discharge.  Her nausea had resolved.  She had trouble with her blood pressure and that was improved at the time of discharge but not fully controlled.  She had significant trouble with anxiety while she was in the hospital and I think that had something to do with her blood pressure issues.  She was able to eat and drink able to ambulate and was at maximum hospital benefit. Discharge Exam: Blood pressure (!) 179/65, pulse 84, temperature 98.3 F (36.8 C), temperature source Oral, resp. rate 14, height 5\' 3"   (1.6 m), weight 52 kg, SpO2 94 %. She is awake and alert.  She is anxious.  Chest is clear.  Heart is regular.  Abdomen is soft with no masses  Disposition: Home with close follow-up in my office.  I have modified her blood pressure medications.  We discussed home health services and she declines      Signed: Alonza Bogus   07/07/2019, 7:50 AM

## 2019-07-23 DIAGNOSIS — I10 Essential (primary) hypertension: Secondary | ICD-10-CM | POA: Diagnosis not present

## 2019-07-23 DIAGNOSIS — K210 Gastro-esophageal reflux disease with esophagitis: Secondary | ICD-10-CM | POA: Diagnosis not present

## 2019-07-23 DIAGNOSIS — J449 Chronic obstructive pulmonary disease, unspecified: Secondary | ICD-10-CM | POA: Diagnosis not present

## 2019-07-23 DIAGNOSIS — F321 Major depressive disorder, single episode, moderate: Secondary | ICD-10-CM | POA: Diagnosis not present

## 2019-07-30 DIAGNOSIS — H40043 Steroid responder, bilateral: Secondary | ICD-10-CM | POA: Diagnosis not present

## 2019-08-06 DIAGNOSIS — E785 Hyperlipidemia, unspecified: Secondary | ICD-10-CM | POA: Diagnosis not present

## 2019-08-06 DIAGNOSIS — K219 Gastro-esophageal reflux disease without esophagitis: Secondary | ICD-10-CM | POA: Diagnosis not present

## 2019-08-06 DIAGNOSIS — I10 Essential (primary) hypertension: Secondary | ICD-10-CM | POA: Diagnosis not present

## 2019-08-06 DIAGNOSIS — Z23 Encounter for immunization: Secondary | ICD-10-CM | POA: Diagnosis not present

## 2019-08-06 DIAGNOSIS — J301 Allergic rhinitis due to pollen: Secondary | ICD-10-CM | POA: Diagnosis not present

## 2019-09-08 DIAGNOSIS — K2100 Gastro-esophageal reflux disease with esophagitis, without bleeding: Secondary | ICD-10-CM | POA: Diagnosis not present

## 2019-09-08 DIAGNOSIS — I10 Essential (primary) hypertension: Secondary | ICD-10-CM | POA: Diagnosis not present

## 2019-09-08 DIAGNOSIS — J449 Chronic obstructive pulmonary disease, unspecified: Secondary | ICD-10-CM | POA: Diagnosis not present

## 2019-09-29 DIAGNOSIS — J449 Chronic obstructive pulmonary disease, unspecified: Secondary | ICD-10-CM | POA: Diagnosis not present

## 2019-09-29 DIAGNOSIS — F419 Anxiety disorder, unspecified: Secondary | ICD-10-CM | POA: Diagnosis not present

## 2019-09-29 DIAGNOSIS — K219 Gastro-esophageal reflux disease without esophagitis: Secondary | ICD-10-CM | POA: Diagnosis not present

## 2019-09-29 DIAGNOSIS — I10 Essential (primary) hypertension: Secondary | ICD-10-CM | POA: Diagnosis not present

## 2019-12-31 ENCOUNTER — Ambulatory Visit: Payer: Medicare Other | Admitting: Family Medicine

## 2020-01-08 DIAGNOSIS — R000 Tachycardia, unspecified: Secondary | ICD-10-CM | POA: Diagnosis not present

## 2020-01-08 DIAGNOSIS — H20813 Fuchs' heterochromic cyclitis, bilateral: Secondary | ICD-10-CM | POA: Diagnosis not present

## 2020-01-08 DIAGNOSIS — I10 Essential (primary) hypertension: Secondary | ICD-10-CM | POA: Diagnosis not present

## 2020-01-08 DIAGNOSIS — E785 Hyperlipidemia, unspecified: Secondary | ICD-10-CM | POA: Diagnosis not present

## 2020-01-08 DIAGNOSIS — E876 Hypokalemia: Secondary | ICD-10-CM | POA: Diagnosis not present

## 2020-01-08 DIAGNOSIS — K219 Gastro-esophageal reflux disease without esophagitis: Secondary | ICD-10-CM | POA: Diagnosis not present

## 2020-01-08 DIAGNOSIS — Z0189 Encounter for other specified special examinations: Secondary | ICD-10-CM | POA: Diagnosis not present

## 2020-01-16 DIAGNOSIS — I10 Essential (primary) hypertension: Secondary | ICD-10-CM | POA: Diagnosis not present

## 2020-01-16 DIAGNOSIS — E039 Hypothyroidism, unspecified: Secondary | ICD-10-CM | POA: Diagnosis not present

## 2020-01-16 DIAGNOSIS — R7301 Impaired fasting glucose: Secondary | ICD-10-CM | POA: Diagnosis not present

## 2020-01-16 DIAGNOSIS — E785 Hyperlipidemia, unspecified: Secondary | ICD-10-CM | POA: Diagnosis not present

## 2020-01-20 DIAGNOSIS — R7303 Prediabetes: Secondary | ICD-10-CM | POA: Diagnosis not present

## 2020-01-20 DIAGNOSIS — R000 Tachycardia, unspecified: Secondary | ICD-10-CM | POA: Diagnosis not present

## 2020-01-20 DIAGNOSIS — I10 Essential (primary) hypertension: Secondary | ICD-10-CM | POA: Diagnosis not present

## 2020-01-20 DIAGNOSIS — E876 Hypokalemia: Secondary | ICD-10-CM | POA: Diagnosis not present

## 2020-01-20 DIAGNOSIS — H20813 Fuchs' heterochromic cyclitis, bilateral: Secondary | ICD-10-CM | POA: Diagnosis not present

## 2020-01-20 DIAGNOSIS — N1832 Chronic kidney disease, stage 3b: Secondary | ICD-10-CM | POA: Diagnosis not present

## 2020-01-20 DIAGNOSIS — K219 Gastro-esophageal reflux disease without esophagitis: Secondary | ICD-10-CM | POA: Diagnosis not present

## 2020-01-20 DIAGNOSIS — E785 Hyperlipidemia, unspecified: Secondary | ICD-10-CM | POA: Diagnosis not present

## 2020-01-20 DIAGNOSIS — J019 Acute sinusitis, unspecified: Secondary | ICD-10-CM | POA: Diagnosis not present

## 2020-01-28 DIAGNOSIS — H40043 Steroid responder, bilateral: Secondary | ICD-10-CM | POA: Diagnosis not present

## 2020-01-30 DIAGNOSIS — J019 Acute sinusitis, unspecified: Secondary | ICD-10-CM | POA: Diagnosis not present

## 2020-04-15 DIAGNOSIS — I10 Essential (primary) hypertension: Secondary | ICD-10-CM | POA: Diagnosis not present

## 2020-04-15 DIAGNOSIS — J069 Acute upper respiratory infection, unspecified: Secondary | ICD-10-CM | POA: Diagnosis not present

## 2020-04-26 DIAGNOSIS — E039 Hypothyroidism, unspecified: Secondary | ICD-10-CM | POA: Diagnosis not present

## 2020-04-26 DIAGNOSIS — J019 Acute sinusitis, unspecified: Secondary | ICD-10-CM | POA: Diagnosis not present

## 2020-04-26 DIAGNOSIS — R7301 Impaired fasting glucose: Secondary | ICD-10-CM | POA: Diagnosis not present

## 2020-04-26 DIAGNOSIS — E785 Hyperlipidemia, unspecified: Secondary | ICD-10-CM | POA: Diagnosis not present

## 2020-04-26 DIAGNOSIS — Z0000 Encounter for general adult medical examination without abnormal findings: Secondary | ICD-10-CM | POA: Diagnosis not present

## 2020-04-26 DIAGNOSIS — I10 Essential (primary) hypertension: Secondary | ICD-10-CM | POA: Diagnosis not present

## 2020-04-29 DIAGNOSIS — H20813 Fuchs' heterochromic cyclitis, bilateral: Secondary | ICD-10-CM | POA: Diagnosis not present

## 2020-04-29 DIAGNOSIS — R7303 Prediabetes: Secondary | ICD-10-CM | POA: Diagnosis not present

## 2020-04-29 DIAGNOSIS — I10 Essential (primary) hypertension: Secondary | ICD-10-CM | POA: Diagnosis not present

## 2020-04-29 DIAGNOSIS — E876 Hypokalemia: Secondary | ICD-10-CM | POA: Diagnosis not present

## 2020-04-29 DIAGNOSIS — N1832 Chronic kidney disease, stage 3b: Secondary | ICD-10-CM | POA: Diagnosis not present

## 2020-04-29 DIAGNOSIS — E785 Hyperlipidemia, unspecified: Secondary | ICD-10-CM | POA: Diagnosis not present

## 2020-04-29 DIAGNOSIS — J019 Acute sinusitis, unspecified: Secondary | ICD-10-CM | POA: Diagnosis not present

## 2020-04-29 DIAGNOSIS — R000 Tachycardia, unspecified: Secondary | ICD-10-CM | POA: Diagnosis not present

## 2020-04-29 DIAGNOSIS — K219 Gastro-esophageal reflux disease without esophagitis: Secondary | ICD-10-CM | POA: Diagnosis not present

## 2020-05-06 DIAGNOSIS — N1832 Chronic kidney disease, stage 3b: Secondary | ICD-10-CM | POA: Diagnosis not present

## 2020-05-06 DIAGNOSIS — I129 Hypertensive chronic kidney disease with stage 1 through stage 4 chronic kidney disease, or unspecified chronic kidney disease: Secondary | ICD-10-CM | POA: Diagnosis not present

## 2020-05-06 DIAGNOSIS — E876 Hypokalemia: Secondary | ICD-10-CM | POA: Diagnosis not present

## 2020-05-27 DIAGNOSIS — E875 Hyperkalemia: Secondary | ICD-10-CM | POA: Diagnosis not present

## 2020-05-27 DIAGNOSIS — R2240 Localized swelling, mass and lump, unspecified lower limb: Secondary | ICD-10-CM | POA: Diagnosis not present

## 2020-05-27 DIAGNOSIS — J019 Acute sinusitis, unspecified: Secondary | ICD-10-CM | POA: Diagnosis not present

## 2020-05-27 DIAGNOSIS — I10 Essential (primary) hypertension: Secondary | ICD-10-CM | POA: Diagnosis not present

## 2020-06-07 ENCOUNTER — Encounter: Payer: Self-pay | Admitting: *Deleted

## 2020-06-08 ENCOUNTER — Encounter: Payer: Self-pay | Admitting: Cardiology

## 2020-06-08 ENCOUNTER — Ambulatory Visit (INDEPENDENT_AMBULATORY_CARE_PROVIDER_SITE_OTHER): Payer: Medicare Other | Admitting: Cardiology

## 2020-06-08 VITALS — BP 138/58 | HR 77 | Ht 63.0 in | Wt 118.2 lb

## 2020-06-08 DIAGNOSIS — I10 Essential (primary) hypertension: Secondary | ICD-10-CM

## 2020-06-08 DIAGNOSIS — R002 Palpitations: Secondary | ICD-10-CM | POA: Diagnosis not present

## 2020-06-08 MED ORDER — CLONIDINE HCL 0.1 MG PO TABS: 0.1000 mg | ORAL_TABLET | Freq: Three times a day (TID) | ORAL | 6 refills | Status: DC

## 2020-06-08 NOTE — Progress Notes (Signed)
Clinical Summary Stephanie West is a 79 y.o.female seen as new consult, referred by Dr Nevada Crane for HTN.   1. HTN - hyponatremia on HCTZ in the past, noted during 06/2019 admission. HCTZ was stopped Recent issues with drowsiness  - back on HCTZ due to some LE edema. Restarted 1 month ago - some leg swelling on amlodopine started 1 month ago, mild. On 5mg  did ok, on 10mg  had some swelling, lowerd back to 5mg  daily.  - white coat HTN - has been on propanolol for several years for blood pressure.  - has been on guanfacine for several years, alpha 2 agonist similar to clonidine.   2. Hyponatremia - she reports poor oral intake 06/2019.  - was on HCTZ at the time, was stopped - has since been restarted on HCTZ 12.5mg  daily.   Past Medical History:  Diagnosis Date  . Asthma   . Dysphagia 05/10/2017  . Elevated transaminase level   . GERD (gastroesophageal reflux disease)   . Hypertension   . MVP (mitral valve prolapse)      Allergies  Allergen Reactions  . Apple Other (See Comments)    Wheezing and Asthma Symptoms   . Aspirin Other (See Comments)    Wheezing, Asthma Symptoms   . Avelox [Moxifloxacin Hcl In Nacl] Hives    Pt can take cipro  . Bee Venom Other (See Comments)    Wheezing, Asthma Symptoms  . Benadryl [Diphenhydramine Hcl] Other (See Comments)    Wheezing, Asthma Symptoms   . Chicken Allergy Other (See Comments)    Wheezing and Asthma Symptoms   . Codeine Other (See Comments)    Makes patient feel like she's going to pass out.   Haze Boyden [Gemifloxacin Mesylate] Hives  . Factive [Gemifloxacin] Hives  . Iodinated Diagnostic Agents Other (See Comments)    Pt was told by allergist to avoid  Dye due to other allergies  . Iodine   . Levaquin [Levofloxacin In D5w] Hives    Pt can take cipro  . Levofloxacin Hives  . Penicillins Other (See Comments)    Asthma Symptoms .Did it involve swelling of the face/tongue/throat, SOB, or low BP? No Did it involve sudden or  severe rash/hives, skin peeling, or any reaction on the inside of your mouth or nose? Yes Did you need to seek medical attention at a hospital or doctor's office? Unknown When did it last happen?Unkown If all above answers are "NO", may proceed with cephalosporin use.   . Sulfa Antibiotics Nausea Only  . Tequin [Gatifloxacin] Hives     Current Outpatient Medications  Medication Sig Dispense Refill  . albuterol (PROVENTIL HFA;VENTOLIN HFA) 108 (90 BASE) MCG/ACT inhaler Inhale 2 puffs into the lungs every 6 (six) hours as needed for wheezing or shortness of breath.    . ALPRAZolam (XANAX) 0.25 MG tablet Take 1 tablet by mouth 2 (two) times daily as needed.    . cloNIDine (CATAPRES) 0.1 MG tablet Take 2 tablets (0.2 mg total) by mouth 3 (three) times daily. When you run out of the supply you have I will send in the prescription for the larger dose 60 tablet 11  . Cyanocobalamin (VITAMIN B 12 PO) Take 2,000 mcg by mouth daily.    Marland Kitchen diltiazem (CARDIZEM CD) 360 MG 24 hr capsule Take 360 mg by mouth daily.     Marland Kitchen ezetimibe (ZETIA) 10 MG tablet Take 10 mg by mouth daily.    . fish oil-omega-3 fatty acids 1000 MG  capsule Take 1 g by mouth 2 (two) times daily.     . Fluticasone-Salmeterol (ADVAIR) 250-50 MCG/DOSE AEPB Inhale 1 puff into the lungs every 12 (twelve) hours.     Marland Kitchen guanFACINE (TENEX) 1 MG tablet Take 1 mg by mouth at bedtime.      Marland Kitchen loteprednol (LOTEMAX) 0.2 % SUSP Place 1 drop into both eyes daily.     . multivitamin-iron-minerals-folic acid (CENTRUM) chewable tablet Chew 1 tablet by mouth daily.    . Netarsudil Dimesylate 0.02 % SOLN Place 1 drop into both eyes every evening.    . olmesartan (BENICAR) 40 MG tablet Take 1 tablet by mouth daily.    . ondansetron (ZOFRAN) 4 MG tablet Take 1 tablet (4 mg total) by mouth every 6 (six) hours as needed for nausea. 30 tablet 2  . pantoprazole (PROTONIX) 40 MG tablet Take 40 mg by mouth daily.    . potassium chloride (K-DUR) 10 MEQ tablet  Take 10 mEq by mouth 3 (three) times daily.     . propranolol (INDERAL) 40 MG tablet Take 40 mg by mouth 2 (two) times daily.    . sertraline (ZOLOFT) 50 MG tablet Take 1 tablet (50 mg total) by mouth daily. 30 tablet 12  . sucralfate (CARAFATE) 1 g tablet Take 1 tablet by mouth 4 (four) times daily as needed.      No current facility-administered medications for this visit.     Past Surgical History:  Procedure Laterality Date  . COLONOSCOPY    . ESOPHAGEAL DILATION  07/30/2015   Procedure: ESOPHAGEAL DILATION;  Surgeon: Rogene Houston, MD;  Location: [facility];  Service: Endoscopy;;  . ESOPHAGOGASTRODUODENOSCOPY N/A 04/15/2015   Procedure: ESOPHAGOGASTRODUODENOSCOPY (EGD);  Surgeon: Rogene Houston, MD;  Location: [facility];  Service: Endoscopy;  Laterality: N/A;  125 - moved to 1:00, Ann to notify pt  . ESOPHAGOGASTRODUODENOSCOPY N/A 07/30/2015   Procedure: ESOPHAGOGASTRODUODENOSCOPY (EGD);  Surgeon: Rogene Houston, MD;  Location: [facility];  Service: Endoscopy;  Laterality: N/A;  1040  . ESOPHAGOGASTRODUODENOSCOPY (EGD) WITH ESOPHAGEAL DILATION N/A 03/07/2013   Procedure: ESOPHAGOGASTRODUODENOSCOPY (EGD) WITH ESOPHAGEAL DILATION;  Surgeon: Rogene Houston, MD;  Location: [facility];  Service: Endoscopy;  Laterality: N/A;  830  . EYE SURGERY    . LAPAROSCOPIC TUBAL LIGATION  1984  . UPPER GASTROINTESTINAL ENDOSCOPY       Allergies  Allergen Reactions  . Apple Other (See Comments)    Wheezing and Asthma Symptoms   . Aspirin Other (See Comments)    Wheezing, Asthma Symptoms   . Avelox [Moxifloxacin Hcl In Nacl] Hives    Pt can take cipro  . Bee Venom Other (See Comments)    Wheezing, Asthma Symptoms  . Benadryl [Diphenhydramine Hcl] Other (See Comments)    Wheezing, Asthma Symptoms   . Chicken Allergy Other (See Comments)    Wheezing and Asthma Symptoms   . Codeine Other (See Comments)    Makes patient feel like she's going to pass out.   Haze Boyden  [Gemifloxacin Mesylate] Hives  . Factive [Gemifloxacin] Hives  . Iodinated Diagnostic Agents Other (See Comments)    Pt was told by allergist to avoid  Dye due to other allergies  . Iodine   . Levaquin [Levofloxacin In D5w] Hives    Pt can take cipro  . Levofloxacin Hives  . Penicillins Other (See Comments)    Asthma Symptoms .Did it involve swelling of the face/tongue/throat, SOB, or low BP? No  Did it involve sudden or severe rash/hives, skin peeling, or any reaction on the inside of your mouth or nose? Yes Did you need to seek medical attention at a hospital or doctor's office? Unknown When did it last happen?Unkown If all above answers are "NO", may proceed with cephalosporin use.   . Sulfa Antibiotics Nausea Only  . Tequin [Gatifloxacin] Hives      Family History  Problem Relation Age of Onset  . Hypertension Mother   . Prostate cancer Father      Social History Stephanie West reports that she has never smoked. She has never used smokeless tobacco. Stephanie West reports no history of alcohol use.   Review of Systems CONSTITUTIONAL: No weight loss, fever, chills, weakness or fatigue.  HEENT: Eyes: No visual loss, blurred vision, double vision or yellow sclerae.No hearing loss, sneezing, congestion, runny nose or sore throat.  SKIN: No rash or itching.  CARDIOVASCULAR: per hpi RESPIRATORY: No shortness of breath, cough or sputum.  GASTROINTESTINAL: No anorexia, nausea, vomiting or diarrhea. No abdominal pain or blood.  GENITOURINARY: No burning on urination, no polyuria NEUROLOGICAL: No headache, dizziness, syncope, paralysis, ataxia, numbness or tingling in the extremities. No change in bowel or bladder control.  MUSCULOSKELETAL: No muscle, back pain, joint pain or stiffness.  LYMPHATICS: No enlarged nodes. No history of splenectomy.  PSYCHIATRIC: No history of depression or anxiety.  ENDOCRINOLOGIC: No reports of sweating, cold or heat intolerance. No polyuria or  polydipsia.  Marland Kitchen   Physical Examination Today's Vitals   06/08/20 0846  BP: (!) 138/58  Pulse: 77  SpO2: 98%  Weight: 118 lb 3.2 oz (53.6 kg)  Height: 5\' 3"  (1.6 m)   Body mass index is 20.94 kg/m.  Gen: resting comfortably, no acute distress HEENT: no scleral icterus, pupils equal round and reactive, no palptable cervical adenopathy,  CV: RRR, no m/r/g, no jvd Resp: Clear to auscultation bilaterally GI: abdomen is soft, non-tender, non-distended, normal bowel sounds, no hepatosplenomegaly MSK: extremities are warm, no edema.  Skin: warm, no rash Neuro:  no focal deficits Psych: appropriate affect   Diagnostic Studies     Assessment and Plan  1. HTN - long history of HTN - atypcal regimen including tid clonidine, guanfacine which is an alpha agonist same as clonidine, propanolol - significant issues with drowsiness, likely due to the high combined doses of clonidine and guanfacine. Lower clonidine to 0.1mg  tid, wean further and potentially stop both alpha 2 agonists over time. - some swelling on higher norvasc doses - repeat BMET since restarting HCTZ - submit bp log 1 week  - other options in the future would be hydralazine, aldactone. Could change propanolol to more bp effective beta blocker.   2. Palpitations - EKG today shows NSR - obtain 1 week event monitor    Arnoldo Lenis, M.D.

## 2020-06-08 NOTE — Patient Instructions (Signed)
Your physician recommends that you schedule a follow-up appointment in: [place] has recommended you make the following change in your medication:   DECREASE CLONIDINE 0.1 MG THREE TIMES DAILY   Your physician recommends that you return for lab work BMP/MG  Your physician has recommended that you wear an event monitor FOR 1 WEEK. Event monitors are medical devices that record the heart's electrical activity. Doctors most often [place] these monitors to diagnose arrhythmias. Arrhythmias are problems with the speed or rhythm of the heartbeat. The monitor is a small, portable device. You can wear one while you do your normal daily activities. This is usually used to diagnose what is causing palpitations/syncope (passing out).  RECORD BLOOD PRESSURES FOR 1 WEEK AND CALL us WITH READINGS - CHECK DAILY AT THE SAME TIME   Thank you for choosing [facility]!!

## 2020-06-10 ENCOUNTER — Telehealth: Payer: Self-pay | Admitting: Cardiology

## 2020-06-10 DIAGNOSIS — E875 Hyperkalemia: Secondary | ICD-10-CM | POA: Diagnosis not present

## 2020-06-10 DIAGNOSIS — I10 Essential (primary) hypertension: Secondary | ICD-10-CM | POA: Diagnosis not present

## 2020-06-10 DIAGNOSIS — R2240 Localized swelling, mass and lump, unspecified lower limb: Secondary | ICD-10-CM | POA: Diagnosis not present

## 2020-06-10 NOTE — Telephone Encounter (Signed)
Pt says HR remains in the 90s with palpitations says she can feel her heart beat in her head  Says palps have gotten worse since decrease of clonidine 0.1 mg tid - doesn't know what BP is but nurse is coming at 11 am and will have it checked at that time - was seen 8/17 and monitor was ordered at that time which will be shipped to her home

## 2020-06-10 NOTE — Telephone Encounter (Signed)
New message    Pt c/o medication issue:  1. Name of Medication: cloNIDine (CATAPRES) 0.1 MG tablet 2. How are you currently taking this medication (dosage and times per day)? Was told to take 1 tablets 3x a day   3. Are you having a reaction (difficulty breathing--STAT)? yes  4. What is your medication issue?feels like she has a pounding in her chest and hr increases to 90

## 2020-06-11 ENCOUNTER — Other Ambulatory Visit: Payer: Self-pay | Admitting: Cardiology

## 2020-06-11 DIAGNOSIS — I10 Essential (primary) hypertension: Secondary | ICD-10-CM | POA: Diagnosis not present

## 2020-06-11 NOTE — Telephone Encounter (Signed)
Need to get monitor results. If not having very high heart rates would argue against any form of clonidine withdrawal. Trying to wean her clonidine due to fatigue, may take time for body to adjust to coming down in the dose  Zandra Abts MD

## 2020-06-12 DIAGNOSIS — R002 Palpitations: Secondary | ICD-10-CM | POA: Diagnosis not present

## 2020-06-12 LAB — BASIC METABOLIC PANEL WITH GFR
BUN/Creatinine Ratio: 14 (calc) (ref 6–22)
BUN: 17 mg/dL (ref 7–25)
CO2: 27 mmol/L (ref 20–32)
Calcium: 9.1 mg/dL (ref 8.6–10.4)
Chloride: 90 mmol/L — ABNORMAL LOW (ref 98–110)
Creat: 1.2 mg/dL — ABNORMAL HIGH (ref 0.60–0.88)
GFR, Est African American: 49 mL/min/{1.73_m2} — ABNORMAL LOW (ref >=60)
GFR, Est Non African American: 42 mL/min/{1.73_m2} — ABNORMAL LOW (ref >=60)
Glucose, Bld: 93 mg/dL (ref 65–139)
Potassium: 4.2 mmol/L (ref 3.5–5.3)
Sodium: 126 mmol/L — ABNORMAL LOW (ref 135–146)

## 2020-06-12 LAB — MAGNESIUM: Magnesium: 1.8 mg/dL (ref 1.5–2.5)

## 2020-06-14 ENCOUNTER — Ambulatory Visit (INDEPENDENT_AMBULATORY_CARE_PROVIDER_SITE_OTHER): Payer: Medicare Other

## 2020-06-14 DIAGNOSIS — R002 Palpitations: Secondary | ICD-10-CM | POA: Diagnosis not present

## 2020-06-15 ENCOUNTER — Ambulatory Visit (INDEPENDENT_AMBULATORY_CARE_PROVIDER_SITE_OTHER): Payer: Medicare Other | Admitting: Internal Medicine

## 2020-06-15 ENCOUNTER — Telehealth: Payer: Self-pay | Admitting: *Deleted

## 2020-06-15 ENCOUNTER — Other Ambulatory Visit: Payer: Self-pay

## 2020-06-15 ENCOUNTER — Encounter (INDEPENDENT_AMBULATORY_CARE_PROVIDER_SITE_OTHER): Payer: Self-pay | Admitting: Internal Medicine

## 2020-06-15 VITALS — BP 180/76 | HR 90 | Temp 98.5°F | Ht 63.0 in | Wt 113.5 lb

## 2020-06-15 DIAGNOSIS — K222 Esophageal obstruction: Secondary | ICD-10-CM | POA: Diagnosis not present

## 2020-06-15 DIAGNOSIS — K219 Gastro-esophageal reflux disease without esophagitis: Secondary | ICD-10-CM

## 2020-06-15 DIAGNOSIS — I10 Essential (primary) hypertension: Secondary | ICD-10-CM

## 2020-06-15 NOTE — Patient Instructions (Addendum)
Can take Colace 200 mg daily at bedtime or MiraLAX half a scoop daily or on as-needed basis for constipation. Please call office if you experience difficulty in swallowing solids.  she is also sent

## 2020-06-15 NOTE — Progress Notes (Signed)
Presenting complaint;  Follow-up for GERD. History of Schatzki's ring.  Database subjective:  Patient is 79 year old Caucasian female who has chronic GERD history of dysphagia secondary to Schatzki's ring which was last dilated in September 2016. She is here for scheduled visit.  She was last seen 1 year ago. She says she is doing well as far as heartburn is concerned.  She may have an episode every 14 years or so.  She feels as omeprazole is working.  She says she is a slow eater and she chews her food thoroughly.  She is not having any swallowing difficulty.  Her bowels move every other day.  She denies melena or rectal bleeding.  She has lost 5 pounds since her last visit.  Her appetite is so-so.  She is still struggling with loss of her husband who died 32 months ago.  She tries to stay busy.  She does crossword puzzle as well as check so.  She spends time with her friends and neighbors.  She walks every day around the house or her driveway.  She has 4 children and 3 of them live close by.  One of her daughters lives in [place].  She has 14 grandchildren.  He does not see him often because of Covid.  Current Medications: Outpatient Encounter Medications as of 06/15/2020  Medication Sig  . albuterol (PROVENTIL HFA;VENTOLIN HFA) 108 (90 BASE) MCG/ACT inhaler Inhale 2 puffs into the lungs every 6 (six) hours as needed for wheezing or shortness of breath.  . ALPRAZolam (XANAX) 0.25 MG tablet Take 1 tablet by mouth 2 (two) times daily as needed.  Marland Kitchen amLODipine (NORVASC) 5 MG tablet Take 5 mg by mouth daily.  . cloNIDine (CATAPRES) 0.1 MG tablet Take 1 tablet (0.1 mg total) by mouth 3 (three) times daily.  Marland Kitchen diltiazem (CARDIZEM CD) 360 MG 24 hr capsule Take 360 mg by mouth daily.   Marland Kitchen esomeprazole (NEXIUM) 40 MG capsule Take 40 mg by mouth daily at 12 noon.  . ezetimibe (ZETIA) 10 MG tablet Take 10 mg by mouth daily.  . fish oil-omega-3 fatty acids 1000 MG capsule Take 1 g by mouth 2 (two) times daily.    . Fluticasone-Salmeterol (ADVAIR) 250-50 MCG/DOSE AEPB Inhale 1 puff into the lungs every 12 (twelve) hours.   Marland Kitchen guanFACINE (INTUNIV) 1 MG TB24 ER tablet Take 1 mg by mouth daily.  . hydrochlorothiazide (MICROZIDE) 12.5 MG capsule Take 12.5 mg by mouth daily.  Marland Kitchen loteprednol (LOTEMAX) 0.2 % SUSP Place 1 drop into both eyes daily.   . multivitamin-iron-minerals-folic acid (CENTRUM) chewable tablet Chew 1 tablet by mouth daily.  . Netarsudil Dimesylate 0.02 % SOLN Place 1 drop into both eyes every evening.  . olmesartan (BENICAR) 40 MG tablet Take 1 tablet by mouth daily.  . potassium chloride (K-DUR) 10 MEQ tablet Take 10 mEq by mouth 3 (three) times daily.   . [DISCONTINUED] Cyanocobalamin (VITAMIN B 12 PO) Take 2,000 mcg by mouth daily.  . [DISCONTINUED] ondansetron (ZOFRAN) 4 MG tablet Take 1 tablet (4 mg total) by mouth every 6 (six) hours as needed for nausea. (Patient not taking: Reported on 06/15/2020)  . [DISCONTINUED] propranolol (INDERAL) 40 MG tablet Take 40 mg by mouth 2 (two) times daily. (Patient not taking: Reported on 06/15/2020)  . [DISCONTINUED] sucralfate (CARAFATE) 1 g tablet Take 1 tablet by mouth 4 (four) times daily as needed.  (Patient not taking: Reported on 06/15/2020)   No facility-administered encounter medications on file as of 06/15/2020.  Objective: Blood pressure (!) 180/76, pulse 90, temperature 98.5 F (36.9 C), temperature source Oral, height 5\' 3"  (1.6 m), weight 113 lb 8 oz (51.5 kg). Patient is alert and in no acute distress. She is wearing a mask. Conjunctiva is pink. Sclera is nonicteric Oropharyngeal mucosa is normal. No neck masses or thyromegaly noted. Cardiac exam with regular rhythm normal S1 and S2. No murmur or gallop noted. Lungs are clear to auscultation. Abdomen abdomen is symmetrical soft and nontender with organomegaly or masses. No LE edema or clubbing noted.   Assessment:  #1.  Chronic GERD.  She has known moderate sized sliding  hiatal hernia.  She is doing well with antireflux measures and PPI.  She will continue therapy as long as it is working and she is not having any side effects.  #2.  History of Schatzki's ring which was last dilated disrupted in October 2016.  She is not having dysphagia.  Plan:  Continue esomeprazole 40 mg by mouth 30 minutes before breakfast daily. Patient can use Colace or polyethylene glycol if she were to become constipated. Office visit in 1 year.

## 2020-06-15 NOTE — Telephone Encounter (Signed)
-----   Message from Arnoldo Lenis, MD sent at 06/15/2020  3:20 PM EDT ----- Sodium is low again, also some increased stress on the kidneys. This is likely from the HCTZ, please stop taking and repeat a BMET in 2 weeks  Zandra Abts MD

## 2020-06-16 ENCOUNTER — Telehealth: Payer: Self-pay | Admitting: *Deleted

## 2020-06-16 NOTE — Telephone Encounter (Signed)
BP readings for the past 7 days  08/18 152/68 08/19 152/64 08/20 140/60 08/21 140/58 08/22 142/66 08/23 130/60 08/24 144/66

## 2020-06-16 NOTE — Telephone Encounter (Signed)
Patient informed and verbalized understanding of plan. 

## 2020-06-16 NOTE — Telephone Encounter (Signed)
Patient informed and verbalized understanding of plan. °Copy sent to PCP °Lab order faxed to [facility] °

## 2020-06-18 NOTE — Telephone Encounter (Signed)
Patient informed and verbalized understanding of plan. 

## 2020-06-18 NOTE — Telephone Encounter (Signed)
BP's are a little elevated, expected given the changes we have made recently. Verify she stopped taking her HCTZ. WIll wait on heart monitor results prior to make additional changes   Zandra Abts MD

## 2020-06-24 DIAGNOSIS — T7840XD Allergy, unspecified, subsequent encounter: Secondary | ICD-10-CM | POA: Diagnosis not present

## 2020-06-24 DIAGNOSIS — J019 Acute sinusitis, unspecified: Secondary | ICD-10-CM | POA: Diagnosis not present

## 2020-06-24 DIAGNOSIS — I10 Essential (primary) hypertension: Secondary | ICD-10-CM | POA: Diagnosis not present

## 2020-06-30 DIAGNOSIS — I10 Essential (primary) hypertension: Secondary | ICD-10-CM | POA: Diagnosis not present

## 2020-07-01 LAB — BASIC METABOLIC PANEL
BUN/Creatinine Ratio: 12 (calc) (ref 6–22)
BUN: 14 mg/dL (ref 7–25)
CO2: 27 mmol/L (ref 20–32)
Calcium: 9 mg/dL (ref 8.6–10.4)
Chloride: 98 mmol/L (ref 98–110)
Creat: 1.13 mg/dL — ABNORMAL HIGH (ref 0.60–0.88)
Glucose, Bld: 116 mg/dL (ref 65–139)
Potassium: 4.5 mmol/L (ref 3.5–5.3)
Sodium: 132 mmol/L — ABNORMAL LOW (ref 135–146)

## 2020-07-06 ENCOUNTER — Telehealth: Payer: Self-pay | Admitting: *Deleted

## 2020-07-06 NOTE — Telephone Encounter (Signed)
-----   Message from Arnoldo Lenis, MD sent at 07/06/2020 11:58 AM EDT ----- Labs look better, sodium and kidney function have improved off diuretic  Zandra Abts MD

## 2020-07-06 NOTE — Telephone Encounter (Signed)
Are there any PA appts this week to evaluate her. Would not put her back on a fluid pill without her being seen given the issues with low sodiums and kidneys.  Zandra Abts MD

## 2020-07-06 NOTE — Telephone Encounter (Signed)
None this week with extender in [place] or [place] - she has an appt scheduled with you next Friday 9/24 Is this ok?

## 2020-07-06 NOTE — Telephone Encounter (Signed)
Pt aware - c/o leg swelling up to her knees with right leg worse than left said it started weeping 3 days ago but has since resolved and still swelling - denies any SOB weight gain

## 2020-07-08 NOTE — Telephone Encounter (Signed)
Yes that's fine, if signifiacnt issues before then she will need to be seen in ER   Zandra Abts MD

## 2020-07-08 NOTE — Telephone Encounter (Signed)
Pt voiced understanding

## 2020-07-09 DIAGNOSIS — H40013 Open angle with borderline findings, low risk, bilateral: Secondary | ICD-10-CM | POA: Diagnosis not present

## 2020-07-16 ENCOUNTER — Encounter: Payer: Self-pay | Admitting: Cardiology

## 2020-07-16 ENCOUNTER — Other Ambulatory Visit: Payer: Self-pay

## 2020-07-16 ENCOUNTER — Ambulatory Visit (INDEPENDENT_AMBULATORY_CARE_PROVIDER_SITE_OTHER): Payer: Medicare Other | Admitting: Cardiology

## 2020-07-16 VITALS — BP 180/68 | HR 97 | Ht 63.0 in | Wt 116.8 lb

## 2020-07-16 DIAGNOSIS — R002 Palpitations: Secondary | ICD-10-CM | POA: Diagnosis not present

## 2020-07-16 DIAGNOSIS — Z23 Encounter for immunization: Secondary | ICD-10-CM

## 2020-07-16 DIAGNOSIS — I10 Essential (primary) hypertension: Secondary | ICD-10-CM

## 2020-07-16 DIAGNOSIS — R600 Localized edema: Secondary | ICD-10-CM | POA: Diagnosis not present

## 2020-07-16 MED ORDER — FUROSEMIDE 20 MG PO TABS: 20.0000 mg | ORAL_TABLET | ORAL | 2 refills | Status: DC | PRN

## 2020-07-16 MED ORDER — HYDRALAZINE HCL 25 MG PO TABS: 25.0000 mg | ORAL_TABLET | Freq: Three times a day (TID) | ORAL | 6 refills | Status: DC

## 2020-07-16 NOTE — Patient Instructions (Signed)
Medication Instructions:   Stop Norvasc (Amlodipine).  Begin Lasix 20mg  as needed for swelling.   Begin Hydralazine 25mg  three times per day.  Continue all other medications.    Labwork: none  Testing/Procedures: none  Follow-Up: 3 months   Any Other Special Instructions Will Be Listed Below (If Applicable). Please call the office in 1 week with update on blood pressure readings.   If you need a refill on your cardiac medications before your next appointment, please call your pharmacy.

## 2020-07-16 NOTE — Progress Notes (Signed)
Clinical Summary Stephanie West is a 79 y.o.female seen today for follow up of the following medical problems.   1. HTN - white coat HTN, bp's in clinic not reflective of her true bp's.  - difficult to manage bp due to side effects - hyponatremia on HCTZ. Prior episode, recently retreid on HCTZ with recurrent hyponatremia. Resolved with stopping - LE edema on norvasc 10, lowered to 5 and had done well but recentlty with LE edema - has historically been on clonidine and guanfacine, both alpha 2 agonists. SIgnifiacnt fatigue last visit, we weaned clonicine to 0.1mg  tid with improved symptoms.  - has been on dilt 360 and olmesartan without side effects.   - home bp's 118-150/58-78 - fatigue is improving with weanign of clonidine - significant LE edema on norvasc 5 and with stopping HCTZ recently for hyponatremia.    2. Palpitations - 7 day event monitor showed SR and sinus tach - palpitations in early AM around 4 to 5 AM, corresponds on monitor to mild sinus tach - stakes her long acting dilt at night time.    Past Medical History:  Diagnosis Date  . Asthma   . Dysphagia 05/10/2017  . Elevated transaminase level   . GERD (gastroesophageal reflux disease)   . Hypertension   . MVP (mitral valve prolapse)      Allergies  Allergen Reactions  . Apple Other (See Comments)    Wheezing and Asthma Symptoms   . Aspirin Other (See Comments)    Wheezing, Asthma Symptoms   . Avelox [Moxifloxacin Hcl In Nacl] Hives    Pt can take cipro  . Bee Venom Other (See Comments)    Wheezing, Asthma Symptoms  . Benadryl [Diphenhydramine Hcl] Other (See Comments)    Wheezing, Asthma Symptoms   . Chicken Allergy Other (See Comments)    Wheezing and Asthma Symptoms   . Codeine Other (See Comments)    Makes patient feel like she's going to pass out.   Haze Boyden [Gemifloxacin Mesylate] Hives  . Factive [Gemifloxacin] Hives  . Iodinated Diagnostic Agents Other (See Comments)    Pt was told by  allergist to avoid  Dye due to other allergies  . Iodine   . Levaquin [Levofloxacin In D5w] Hives    Pt can take cipro  . Levofloxacin Hives  . Penicillins Other (See Comments)    Asthma Symptoms .Did it involve swelling of the face/tongue/throat, SOB, or low BP? No Did it involve sudden or severe rash/hives, skin peeling, or any reaction on the inside of your mouth or nose? Yes Did you need to seek medical attention at a hospital or doctor's office? Unknown When did it last happen?Unkown If all above answers are "NO", may proceed with cephalosporin use.   . Sulfa Antibiotics Nausea Only  . Tequin [Gatifloxacin] Hives     Current Outpatient Medications  Medication Sig Dispense Refill  . albuterol (PROVENTIL HFA;VENTOLIN HFA) 108 (90 BASE) MCG/ACT inhaler Inhale 2 puffs into the lungs every 6 (six) hours as needed for wheezing or shortness of breath.    . ALPRAZolam (XANAX) 0.25 MG tablet Take 1 tablet by mouth 2 (two) times daily as needed.    Marland Kitchen amLODipine (NORVASC) 5 MG tablet Take 5 mg by mouth daily.    . cloNIDine (CATAPRES) 0.1 MG tablet Take 1 tablet (0.1 mg total) by mouth 3 (three) times daily. 90 tablet 6  . diltiazem (CARDIZEM CD) 360 MG 24 hr capsule Take 360 mg by mouth  daily.     . esomeprazole (NEXIUM) 40 MG capsule Take 40 mg by mouth daily before breakfast.     . ezetimibe (ZETIA) 10 MG tablet Take 10 mg by mouth daily.    . fish oil-omega-3 fatty acids 1000 MG capsule Take 1 g by mouth 2 (two) times daily.     . Fluticasone-Salmeterol (ADVAIR) 250-50 MCG/DOSE AEPB Inhale 1 puff into the lungs every 12 (twelve) hours.     Marland Kitchen guanFACINE (INTUNIV) 1 MG TB24 ER tablet Take 1 mg by mouth daily.    Marland Kitchen loteprednol (LOTEMAX) 0.2 % SUSP Place 1 drop into both eyes daily.     . multivitamin-iron-minerals-folic acid (CENTRUM) chewable tablet Chew 1 tablet by mouth daily.    . Netarsudil Dimesylate 0.02 % SOLN Place 1 drop into both eyes every evening.    . olmesartan  (BENICAR) 40 MG tablet Take 1 tablet by mouth daily.    . potassium chloride (K-DUR) 10 MEQ tablet Take 10 mEq by mouth 3 (three) times daily.      No current facility-administered medications for this visit.     Past Surgical History:  Procedure Laterality Date  . COLONOSCOPY    . ESOPHAGEAL DILATION  07/30/2015   Procedure: ESOPHAGEAL DILATION;  Surgeon: Rogene Houston, MD;  [facility];  Service: Endoscopy;;  . ESOPHAGOGASTRODUODENOSCOPY N/A 04/15/2015   Procedure: ESOPHAGOGASTRODUODENOSCOPY (EGD);  Surgeon: Rogene Houston, MD;  [facility]: [facility];  Service: Endoscopy;  Laterality: N/A;  125 - moved to 1:00, Ann to notify pt  . ESOPHAGOGASTRODUODENOSCOPY N/A 07/30/2015   Procedure: ESOPHAGOGASTRODUODENOSCOPY (EGD);  Surgeon: Rogene Houston, MD;  [facility];  Service: Endoscopy;  Laterality: N/A;  1040  . ESOPHAGOGASTRODUODENOSCOPY (EGD) WITH ESOPHAGEAL DILATION N/A 03/07/2013   Procedure: ESOPHAGOGASTRODUODENOSCOPY (EGD) WITH ESOPHAGEAL DILATION;  Surgeon: Rogene Houston, MD;  [facility];  Service: Endoscopy;  Laterality: N/A;  830  . EYE SURGERY    . LAPAROSCOPIC TUBAL LIGATION  1984  . UPPER GASTROINTESTINAL ENDOSCOPY       Allergies  Allergen Reactions  . Apple Other (See Comments)    Wheezing and Asthma Symptoms   . Aspirin Other (See Comments)    Wheezing, Asthma Symptoms   . Avelox [Moxifloxacin Hcl In Nacl] Hives    Pt can take cipro  . Bee Venom Other (See Comments)    Wheezing, Asthma Symptoms  . Benadryl [Diphenhydramine Hcl] Other (See Comments)    Wheezing, Asthma Symptoms   . Chicken Allergy Other (See Comments)    Wheezing and Asthma Symptoms   . Codeine Other (See Comments)    Makes patient feel like she's going to pass out.   Haze Boyden [Gemifloxacin Mesylate] Hives  . Factive [Gemifloxacin] Hives  . Iodinated Diagnostic Agents Other (See Comments)    Pt was told by allergist to avoid  Dye due to other allergies    . Iodine   . Levaquin [Levofloxacin In D5w] Hives    Pt can take cipro  . Levofloxacin Hives  . Penicillins Other (See Comments)    Asthma Symptoms .Did it involve swelling of the face/tongue/throat, SOB, or low BP? No Did it involve sudden or severe rash/hives, skin peeling, or any reaction on the inside of your mouth or nose? Yes Did you need to seek medical attention at a hospital or doctor's office? Unknown When did it last happen?Unkown If all above answers are "NO", may proceed with cephalosporin use.   Marland Kitchen  Sulfa Antibiotics Nausea Only  . Tequin [Gatifloxacin] Hives      Family History  Problem Relation Age of Onset  . Hypertension Mother   . Prostate cancer Father      Social History Stephanie West reports that she has never smoked. She has never used smokeless tobacco. Stephanie West reports no history of alcohol use.   Review of Systems CONSTITUTIONAL: No weight loss, fever, chills, weakness or fatigue.  HEENT: Eyes: No visual loss, blurred vision, double vision or yellow sclerae.No hearing loss, sneezing, congestion, runny nose or sore throat.  SKIN: No rash or itching.  CARDIOVASCULAR: per hpi RESPIRATORY: No shortness of breath, cough or sputum.  GASTROINTESTINAL: No anorexia, nausea, vomiting or diarrhea. No abdominal pain or blood.  GENITOURINARY: No burning on urination, no polyuria NEUROLOGICAL: No headache, dizziness, syncope, paralysis, ataxia, numbness or tingling in the extremities. No change in bowel or bladder control.  MUSCULOSKELETAL: No muscle, back pain, joint pain or stiffness.  LYMPHATICS: No enlarged nodes. No history of splenectomy.  PSYCHIATRIC: No history of depression or anxiety.  ENDOCRINOLOGIC: No reports of sweating, cold or heat intolerance. No polyuria or polydipsia.  Marland Kitchen   Physical Examination Today's Vitals   07/16/20 1432  BP: (!) 180/68  Pulse: 97  SpO2: 98%  Weight: 116 lb 12.8 oz (53 kg)  Height: 5\' 3"  (1.6 m)   Body  mass index is 20.69 kg/m.  Gen: resting comfortably, no acute distress HEENT: no scleral icterus, pupils equal round and reactive, no palptable cervical adenopathy,  CV: RRR, no m/r,g no jvd Resp: Clear to auscultation bilaterally GI: abdomen is soft, non-tender, non-distended, normal bowel sounds, no hepatosplenomegaly MSK: extremities are warm, 1+ bilateral LE edema.  Skin: warm, no rash Neuro:  no focal deficits Psych: appropriate affect   Diagnostic Studies  05/2020 event monitor  7 day event monitor  Min HR 49, Avg HR 65, Max HR 119  Reported symptoms correlate with sinus rhythm and sinus tachycardia  No significant arrhythmias    Assessment and Plan  1. HTN - complex history as reproted above, multi drug regimen with prior side effects - stop norvasc due to ongoing LE edema. In its place start hydralazine 25mg  tid - she will udpate [place] on bp's next week - titrate hydral as needed, would look to get her off the alpha agonist guanfacine next. Over time wean clonidine further if able     2. LE edema - likely secondary to norvasc and also stopping her HCTZ - starting lasix 20mg  just prn - stopping norvasc  3. Palpitations - mainly at 4-5 AM, recent monitor shows just mild sinus tach at that time - monitor at this time. Once bp meds sorted out if ongoing issue could consider night time beta blocker, already on high dose diltaizem.   BP log 1 week, f/u 3 months  Arnoldo Lenis, M.D.

## 2020-07-20 DIAGNOSIS — Z712 Person consulting for explanation of examination or test findings: Secondary | ICD-10-CM | POA: Diagnosis not present

## 2020-07-20 DIAGNOSIS — I10 Essential (primary) hypertension: Secondary | ICD-10-CM | POA: Diagnosis not present

## 2020-07-20 DIAGNOSIS — E875 Hyperkalemia: Secondary | ICD-10-CM | POA: Diagnosis not present

## 2020-07-20 DIAGNOSIS — R7301 Impaired fasting glucose: Secondary | ICD-10-CM | POA: Diagnosis not present

## 2020-07-20 DIAGNOSIS — J019 Acute sinusitis, unspecified: Secondary | ICD-10-CM | POA: Diagnosis not present

## 2020-07-20 DIAGNOSIS — R2240 Localized swelling, mass and lump, unspecified lower limb: Secondary | ICD-10-CM | POA: Diagnosis not present

## 2020-07-21 ENCOUNTER — Telehealth: Payer: Self-pay | Admitting: Cardiology

## 2020-07-21 NOTE — Telephone Encounter (Signed)
Pt feel she is having a reaction to recent med changes. HR 105-112   Please call [phone]   Thanks renee

## 2020-07-21 NOTE — Telephone Encounter (Signed)
Pt states that she started taking Hydralazine on Sunday morning and she c/o elevated HR, dizziness and pounding in her ears. Blood pressures are as follows Sunday 164/70 HR 109, Monday 156/72 HR 100, and Tuesday 164/68. Please advise.

## 2020-07-22 NOTE — Telephone Encounter (Signed)
Not common side effects of hydralazine but correlates with starting it. Limited options with bp meds, can she try taking it bid over the weekend to see if better tolerated.   J Imanuel Pruiett MD

## 2020-07-23 NOTE — Telephone Encounter (Signed)
Called pt. NA. Lmtcb.

## 2020-07-23 NOTE — Telephone Encounter (Signed)
Pt notified and voiced understanding. Pt will continue to monitor BP and symptoms and call office on Monday.

## 2020-07-26 NOTE — Telephone Encounter (Signed)
Spoke with Pt and she reports that she is still having symptoms but they have gotten better although she states that she is having rapid heart rate. Friday BP at 5 PM was 154/78 HR 87. Saturday BP at 8:30 pm was 158/68 HR 80. Sunday BP at 1:30 pm was 167/70 HR 90. Please advise.

## 2020-07-27 DIAGNOSIS — H20813 Fuchs' heterochromic cyclitis, bilateral: Secondary | ICD-10-CM | POA: Diagnosis not present

## 2020-07-27 DIAGNOSIS — E785 Hyperlipidemia, unspecified: Secondary | ICD-10-CM | POA: Diagnosis not present

## 2020-07-27 DIAGNOSIS — K219 Gastro-esophageal reflux disease without esophagitis: Secondary | ICD-10-CM | POA: Diagnosis not present

## 2020-07-27 DIAGNOSIS — I10 Essential (primary) hypertension: Secondary | ICD-10-CM | POA: Diagnosis not present

## 2020-07-27 DIAGNOSIS — Z0001 Encounter for general adult medical examination with abnormal findings: Secondary | ICD-10-CM | POA: Diagnosis not present

## 2020-07-27 DIAGNOSIS — N1832 Chronic kidney disease, stage 3b: Secondary | ICD-10-CM | POA: Diagnosis not present

## 2020-07-27 DIAGNOSIS — R000 Tachycardia, unspecified: Secondary | ICD-10-CM | POA: Diagnosis not present

## 2020-07-27 DIAGNOSIS — R7303 Prediabetes: Secondary | ICD-10-CM | POA: Diagnosis not present

## 2020-07-27 DIAGNOSIS — J019 Acute sinusitis, unspecified: Secondary | ICD-10-CM | POA: Diagnosis not present

## 2020-07-27 DIAGNOSIS — E876 Hypokalemia: Secondary | ICD-10-CM | POA: Diagnosis not present

## 2020-07-27 NOTE — Telephone Encounter (Signed)
Patient states her does not flutter, it pounds.HR around 100 and lasts 30-60 minutes

## 2020-07-27 NOTE — Telephone Encounter (Signed)
May take some additional time to get used to changes. How often is the heart fluttering and for how long.    Zandra Abts MD

## 2020-07-29 MED ORDER — METOPROLOL TARTRATE 25 MG PO TABS: 25.0000 mg | ORAL_TABLET | Freq: Two times a day (BID) | ORAL | 6 refills | Status: DC

## 2020-07-29 NOTE — Telephone Encounter (Signed)
Left message to return call 

## 2020-07-29 NOTE — Telephone Encounter (Signed)
I spoke with patient.She agrees to try lopressor 25 mg BID and let us know if it helps.

## 2020-07-29 NOTE — Telephone Encounter (Signed)
Can she start lopressor 25mg  bid to help with palpitations, update [place] early next week  Zandra Abts MD

## 2020-07-29 NOTE — Addendum Note (Signed)
Addended by: Barbarann Ehlers A on: 07/29/2020 03:20 PM   Modules accepted: Orders

## 2020-07-30 ENCOUNTER — Other Ambulatory Visit: Payer: Self-pay | Admitting: *Deleted

## 2020-07-30 MED ORDER — HYDRALAZINE HCL 25 MG PO TABS: 25.0000 mg | ORAL_TABLET | Freq: Three times a day (TID) | ORAL | 3 refills | Status: DC

## 2020-08-04 DIAGNOSIS — H40043 Steroid responder, bilateral: Secondary | ICD-10-CM | POA: Diagnosis not present

## 2020-08-09 DIAGNOSIS — J069 Acute upper respiratory infection, unspecified: Secondary | ICD-10-CM | POA: Diagnosis not present

## 2020-09-01 DIAGNOSIS — Z23 Encounter for immunization: Secondary | ICD-10-CM | POA: Diagnosis not present

## 2020-09-23 DIAGNOSIS — L255 Unspecified contact dermatitis due to plants, except food: Secondary | ICD-10-CM | POA: Diagnosis not present

## 2020-09-23 DIAGNOSIS — C44622 Squamous cell carcinoma of skin of right upper limb, including shoulder: Secondary | ICD-10-CM | POA: Diagnosis not present

## 2020-10-07 DIAGNOSIS — Z85828 Personal history of other malignant neoplasm of skin: Secondary | ICD-10-CM | POA: Diagnosis not present

## 2020-10-07 DIAGNOSIS — Z08 Encounter for follow-up examination after completed treatment for malignant neoplasm: Secondary | ICD-10-CM | POA: Diagnosis not present

## 2020-10-07 DIAGNOSIS — L258 Unspecified contact dermatitis due to other agents: Secondary | ICD-10-CM | POA: Diagnosis not present

## 2020-10-28 DIAGNOSIS — J0100 Acute maxillary sinusitis, unspecified: Secondary | ICD-10-CM | POA: Diagnosis not present

## 2020-11-03 DIAGNOSIS — J019 Acute sinusitis, unspecified: Secondary | ICD-10-CM | POA: Diagnosis not present

## 2020-11-03 DIAGNOSIS — E785 Hyperlipidemia, unspecified: Secondary | ICD-10-CM | POA: Diagnosis not present

## 2020-11-03 DIAGNOSIS — Z0001 Encounter for general adult medical examination with abnormal findings: Secondary | ICD-10-CM | POA: Diagnosis not present

## 2020-11-03 DIAGNOSIS — H20813 Fuchs' heterochromic cyclitis, bilateral: Secondary | ICD-10-CM | POA: Diagnosis not present

## 2020-11-03 DIAGNOSIS — R7303 Prediabetes: Secondary | ICD-10-CM | POA: Diagnosis not present

## 2020-11-03 DIAGNOSIS — R000 Tachycardia, unspecified: Secondary | ICD-10-CM | POA: Diagnosis not present

## 2020-11-03 DIAGNOSIS — I10 Essential (primary) hypertension: Secondary | ICD-10-CM | POA: Diagnosis not present

## 2020-11-03 DIAGNOSIS — R2240 Localized swelling, mass and lump, unspecified lower limb: Secondary | ICD-10-CM | POA: Diagnosis not present

## 2020-11-03 DIAGNOSIS — E875 Hyperkalemia: Secondary | ICD-10-CM | POA: Diagnosis not present

## 2020-11-03 DIAGNOSIS — N1832 Chronic kidney disease, stage 3b: Secondary | ICD-10-CM | POA: Diagnosis not present

## 2020-11-03 DIAGNOSIS — E876 Hypokalemia: Secondary | ICD-10-CM | POA: Diagnosis not present

## 2020-11-03 DIAGNOSIS — K219 Gastro-esophageal reflux disease without esophagitis: Secondary | ICD-10-CM | POA: Diagnosis not present

## 2020-11-04 DIAGNOSIS — N1832 Chronic kidney disease, stage 3b: Secondary | ICD-10-CM | POA: Diagnosis not present

## 2020-11-04 DIAGNOSIS — H20813 Fuchs' heterochromic cyclitis, bilateral: Secondary | ICD-10-CM | POA: Diagnosis not present

## 2020-11-04 DIAGNOSIS — K219 Gastro-esophageal reflux disease without esophagitis: Secondary | ICD-10-CM | POA: Diagnosis not present

## 2020-11-04 DIAGNOSIS — E785 Hyperlipidemia, unspecified: Secondary | ICD-10-CM | POA: Diagnosis not present

## 2020-11-04 DIAGNOSIS — I10 Essential (primary) hypertension: Secondary | ICD-10-CM | POA: Diagnosis not present

## 2020-11-04 DIAGNOSIS — R7303 Prediabetes: Secondary | ICD-10-CM | POA: Diagnosis not present

## 2020-11-04 DIAGNOSIS — E876 Hypokalemia: Secondary | ICD-10-CM | POA: Diagnosis not present

## 2020-11-04 DIAGNOSIS — R000 Tachycardia, unspecified: Secondary | ICD-10-CM | POA: Diagnosis not present

## 2020-11-04 DIAGNOSIS — J019 Acute sinusitis, unspecified: Secondary | ICD-10-CM | POA: Diagnosis not present

## 2020-11-15 ENCOUNTER — Encounter: Payer: Self-pay | Admitting: Cardiology

## 2020-11-15 ENCOUNTER — Other Ambulatory Visit: Payer: Self-pay

## 2020-11-15 ENCOUNTER — Ambulatory Visit (INDEPENDENT_AMBULATORY_CARE_PROVIDER_SITE_OTHER): Payer: Medicare Other | Admitting: Cardiology

## 2020-11-15 VITALS — BP 192/68 | HR 82 | Ht 63.0 in | Wt 114.0 lb

## 2020-11-15 DIAGNOSIS — I10 Essential (primary) hypertension: Secondary | ICD-10-CM

## 2020-11-15 DIAGNOSIS — R002 Palpitations: Secondary | ICD-10-CM

## 2020-11-15 MED ORDER — METOPROLOL TARTRATE 25 MG PO TABS: 12.5000 mg | ORAL_TABLET | Freq: Two times a day (BID) | ORAL | 3 refills | Status: DC

## 2020-11-15 NOTE — Progress Notes (Signed)
Clinical Summary Stephanie West is a 80 y.o.female   seen today for follow up of the following medical problems.   1. HTN - white coat HTN, bp's in clinic not reflective of her true bp's.  - difficult to manage bp due to side effects - hyponatremia on HCTZ. Prior episode, recently retreid on HCTZ with recurrent hyponatremia. Resolved with stopping - LE edema on norvasc 10, lowered to 5 and had done well but recentlty with LE edema - has historically been on clonidine and guanfacine, both alpha 2 agonists. SIgnifiacnt fatigue last visit, we weaned clonicine to 0.1mg  tid with improved symptoms.  - has been on dilt 360 and olmesartan without side effects.   Prior visit - home bp's 118-150/58-78 - fatigue is improving with weanign of clonidine - significant LE edema on norvasc 5 and with stopping HCTZ recently for hyponatremia.     - just got a new bp cuff today, does not have recent home nuimbers   2. Palpitations - 7 day event monitor showed SR and sinus tach - palpitations in early AM around 4 to 5 AM, corresponds on monitor to mild sinus tach - stakes her long acting dilt at night time.    - she stopped lopressor on her own due HRs 55, denies symptoms.  - no recent symptoms.   Past Medical History:  Diagnosis Date  . Asthma   . Dysphagia 05/10/2017  . Elevated transaminase level   . GERD (gastroesophageal reflux disease)   . Hypertension   . MVP (mitral valve prolapse)      Allergies  Allergen Reactions  . Apple Other (See Comments)    Wheezing and Asthma Symptoms   . Aspirin Other (See Comments)    Wheezing, Asthma Symptoms   . Avelox [Moxifloxacin Hcl In Nacl] Hives    Pt can take cipro  . Bee Venom Other (See Comments)    Wheezing, Asthma Symptoms  . Benadryl [Diphenhydramine Hcl] Other (See Comments)    Wheezing, Asthma Symptoms   . Chicken Allergy Other (See Comments)    Wheezing and Asthma Symptoms   . Codeine Other (See Comments)    Makes  patient feel like she's going to pass out.   Haze Boyden [Gemifloxacin Mesylate] Hives  . Factive [Gemifloxacin] Hives  . Hctz [Hydrochlorothiazide]     hyponatremia  . Iodinated Diagnostic Agents Other (See Comments)    Pt was told by allergist to avoid  Dye due to other allergies  . Iodine   . Levaquin [Levofloxacin In D5w] Hives    Pt can take cipro  . Levofloxacin Hives  . Penicillins Other (See Comments)    Asthma Symptoms .Did it involve swelling of the face/tongue/throat, SOB, or low BP? No Did it involve sudden or severe rash/hives, skin peeling, or any reaction on the inside of your mouth or nose? Yes Did you need to seek medical attention at a hospital or doctor's office? Unknown When did it last happen?Unkown If all above answers are "NO", may proceed with cephalosporin use.   . Sulfa Antibiotics Nausea Only  . Tequin [Gatifloxacin] Hives     Current Outpatient Medications  Medication Sig Dispense Refill  . albuterol (PROVENTIL HFA;VENTOLIN HFA) 108 (90 BASE) MCG/ACT inhaler Inhale 2 puffs into the lungs every 6 (six) hours as needed for wheezing or shortness of breath.    . ALPRAZolam (XANAX) 0.25 MG tablet Take 1 tablet by mouth 2 (two) times daily as needed.    . cloNIDine (  CATAPRES) 0.1 MG tablet Take 1 tablet (0.1 mg total) by mouth 3 (three) times daily. 90 tablet 6  . diltiazem (CARDIZEM CD) 360 MG 24 hr capsule Take 360 mg by mouth daily.     Marland Kitchen esomeprazole (NEXIUM) 40 MG capsule Take 40 mg by mouth daily before breakfast.     . ezetimibe (ZETIA) 10 MG tablet Take 10 mg by mouth daily.    . fish oil-omega-3 fatty acids 1000 MG capsule Take 1 g by mouth 2 (two) times daily.     . Fluticasone-Salmeterol (ADVAIR) 250-50 MCG/DOSE AEPB Inhale 1 puff into the lungs every 12 (twelve) hours.     . furosemide (LASIX) 20 MG tablet Take 1 tablet (20 mg total) by mouth as needed for edema (swelling). 30 tablet 2  . guanFACINE (INTUNIV) 1 MG TB24 ER tablet Take 1 mg by  mouth daily.    . hydrALAZINE (APRESOLINE) 25 MG tablet Take 1 tablet (25 mg total) by mouth 3 (three) times daily. 270 tablet 3  . loteprednol (LOTEMAX) 0.2 % SUSP Place 1 drop into both eyes daily.     . metoprolol tartrate (LOPRESSOR) 25 MG tablet Take 1 tablet (25 mg total) by mouth 2 (two) times daily. 60 tablet 6  . multivitamin-iron-minerals-folic acid (CENTRUM) chewable tablet Chew 1 tablet by mouth daily.    . Netarsudil Dimesylate 0.02 % SOLN Place 1 drop into both eyes every evening.    . olmesartan (BENICAR) 40 MG tablet Take 1 tablet by mouth daily.    . potassium chloride (K-DUR) 10 MEQ tablet Take 10 mEq by mouth 3 (three) times daily.      No current facility-administered medications for this visit.     Past Surgical History:  Procedure Laterality Date  . COLONOSCOPY    . ESOPHAGEAL DILATION  07/30/2015   Procedure: ESOPHAGEAL DILATION;  Surgeon: Rogene Houston, MD;  [facility];  Service: Endoscopy;;  . ESOPHAGOGASTRODUODENOSCOPY N/A 04/15/2015   Procedure: ESOPHAGOGASTRODUODENOSCOPY (EGD);  Surgeon: Rogene Houston, MD;  [facility];  Service: Endoscopy;  Laterality: N/A;  125 - moved to 1:00, Ann to notify pt  . ESOPHAGOGASTRODUODENOSCOPY N/A 07/30/2015   Procedure: ESOPHAGOGASTRODUODENOSCOPY (EGD);  Surgeon: Rogene Houston, MD;  [facility];  Service: Endoscopy;  Laterality: N/A;  1040  . ESOPHAGOGASTRODUODENOSCOPY (EGD) WITH ESOPHAGEAL DILATION N/A 03/07/2013   Procedure: ESOPHAGOGASTRODUODENOSCOPY (EGD) WITH ESOPHAGEAL DILATION;  Surgeon: Rogene Houston, MD;  [facility];  Service: Endoscopy;  Laterality: N/A;  830  . EYE SURGERY    . LAPAROSCOPIC TUBAL LIGATION  1984  . UPPER GASTROINTESTINAL ENDOSCOPY       Allergies  Allergen Reactions  . Apple Other (See Comments)    Wheezing and Asthma Symptoms   . Aspirin Other (See Comments)    Wheezing, Asthma Symptoms   . Avelox [Moxifloxacin Hcl In Nacl] Hives    Pt  can take cipro  . Bee Venom Other (See Comments)    Wheezing, Asthma Symptoms  . Benadryl [Diphenhydramine Hcl] Other (See Comments)    Wheezing, Asthma Symptoms   . Chicken Allergy Other (See Comments)    Wheezing and Asthma Symptoms   . Codeine Other (See Comments)    Makes patient feel like she's going to pass out.   Haze Boyden [Gemifloxacin Mesylate] Hives  . Factive [Gemifloxacin] Hives  . Hctz [Hydrochlorothiazide]     hyponatremia  . Iodinated Diagnostic Agents Other (See Comments)    Pt was  told by allergist to avoid  Dye due to other allergies  . Iodine   . Levaquin [Levofloxacin In D5w] Hives    Pt can take cipro  . Levofloxacin Hives  . Penicillins Other (See Comments)    Asthma Symptoms .Did it involve swelling of the face/tongue/throat, SOB, or low BP? No Did it involve sudden or severe rash/hives, skin peeling, or any reaction on the inside of your mouth or nose? Yes Did you need to seek medical attention at a hospital or doctor's office? Unknown When did it last happen?Unkown If all above answers are "NO", may proceed with cephalosporin use.   . Sulfa Antibiotics Nausea Only  . Tequin [Gatifloxacin] Hives      Family History  Problem Relation Age of Onset  . Hypertension Mother   . Prostate cancer Father      Social History Ms. Petroni reports that she has never smoked. She has never used smokeless tobacco. Ms. Keng reports no history of alcohol use.   Review of Systems CONSTITUTIONAL: No weight loss, fever, chills, weakness or fatigue.  HEENT: Eyes: No visual loss, blurred vision, double vision or yellow sclerae.No hearing loss, sneezing, congestion, runny nose or sore throat.  SKIN: No rash or itching.  CARDIOVASCULAR: per hpi RESPIRATORY: No shortness of breath, cough or sputum.  GASTROINTESTINAL: No anorexia, nausea, vomiting or diarrhea. No abdominal pain or blood.  GENITOURINARY: No burning on urination, no polyuria NEUROLOGICAL: No  headache, dizziness, syncope, paralysis, ataxia, numbness or tingling in the extremities. No change in bowel or bladder control.  MUSCULOSKELETAL: No muscle, back pain, joint pain or stiffness.  LYMPHATICS: No enlarged nodes. No history of splenectomy.  PSYCHIATRIC: No history of depression or anxiety.  ENDOCRINOLOGIC: No reports of sweating, cold or heat intolerance. No polyuria or polydipsia.  Marland Kitchen   Physical Examination Today's Vitals   11/15/20 1241  BP: (!) 192/68  Pulse: 82  SpO2: 97%  Weight: 114 lb (51.7 kg)  Height: 5\' 3"  (1.6 m)   Body mass index is 20.19 kg/m.  Gen: resting comfortably, no acute distress HEENT: no scleral icterus, pupils equal round and reactive, no palptable cervical adenopathy,  CV: RRR, no m/r/g, no jvd Resp: Clear to auscultation bilaterally GI: abdomen is soft, non-tender, non-distended, normal bowel sounds, no hepatosplenomegaly MSK: extremities are warm, no edema.  Skin: warm, no rash Neuro:  no focal deficits Psych: appropriate affect   Diagnostic Studies 05/2020 event monitor  7 day event monitor  Min HR 49, Avg HR 65, Max HR 119  Reported symptoms correlate with sinus rhythm and sinus tachycardia  No significant arrhythmias     Assessment and Plan  1. HTN - complex history as reproted above, multi drug regimen with prior side effects - history of white coat HTN, office numbers not reflective of overall control - she will call us 1 week to udpate [place] on home bp's     2. Palpitations - restart lopressor 12.5mg  bid, would accept HRs in mid 50s as long as asymptomatic.     Arnoldo Lenis, M.D.

## 2020-11-15 NOTE — Patient Instructions (Signed)
Medication Instructions:  Take Lopressor 12.5 mg twice a day   *If you need a refill on your cardiac medications before your next appointment, please call your pharmacy*   Lab Work: None today If you have labs (blood work) drawn today and your tests are completely normal, you will receive your results only by: Marland Kitchen MyChart Message (if you have MyChart) OR . A paper copy in the mail If you have any lab test that is abnormal or we need to change your treatment, we will call you to review the results.   Testing/Procedures: None today   Follow-Up: At [facility], you and your health needs are our priority.  As part of our continuing mission to provide you with exceptional heart care, we have created designated Provider Care Teams.  These Care Teams include your primary Cardiologist (physician) and Advanced Practice Providers (APPs -  Physician Assistants and Nurse Practitioners) who all work together to provide you with the care you need, when you need it.  We recommend signing up for the patient portal called "MyChart".  Sign up information is provided on this After Visit Summary.  MyChart is used to connect with patients for Virtual Visits (Telemedicine).  Patients are able to view lab/test results, encounter notes, upcoming appointments, etc.  Non-urgent messages can be sent to your provider as well.   To learn more about what you can do with MyChart, go to [email or web address].    Your next appointment:   6 month(s)  The format for your next appointment:   In Person  Provider:   Carlyle Dolly, MD   Other Instructions Call us in  1 week with BP and heart rate readings         Thank you for choosing [facility] !

## 2020-11-28 ENCOUNTER — Ambulatory Visit (INDEPENDENT_AMBULATORY_CARE_PROVIDER_SITE_OTHER): Payer: Medicare Other

## 2020-11-28 ENCOUNTER — Encounter: Payer: Self-pay | Admitting: Emergency Medicine

## 2020-11-28 ENCOUNTER — Other Ambulatory Visit: Payer: Self-pay

## 2020-11-28 ENCOUNTER — Ambulatory Visit
Admission: EM | Admit: 2020-11-28 | Discharge: 2020-11-28 | Disposition: A | Discharge status: Home / Self Care | Payer: Medicare Other | Attending: Family Medicine | Admitting: Family Medicine

## 2020-11-28 ENCOUNTER — Ambulatory Visit: Payer: Medicare Other

## 2020-11-28 DIAGNOSIS — J0140 Acute pansinusitis, unspecified: Secondary | ICD-10-CM

## 2020-11-28 DIAGNOSIS — R630 Anorexia: Secondary | ICD-10-CM | POA: Diagnosis not present

## 2020-11-28 DIAGNOSIS — R5383 Other fatigue: Secondary | ICD-10-CM

## 2020-11-28 DIAGNOSIS — R0602 Shortness of breath: Secondary | ICD-10-CM

## 2020-11-28 DIAGNOSIS — R062 Wheezing: Secondary | ICD-10-CM

## 2020-11-28 DIAGNOSIS — K449 Diaphragmatic hernia without obstruction or gangrene: Secondary | ICD-10-CM | POA: Diagnosis not present

## 2020-11-28 MED ORDER — CIPROFLOXACIN HCL 500 MG PO TABS: 500.0000 mg | ORAL_TABLET | Freq: Two times a day (BID) | ORAL | 0 refills | Status: DC

## 2020-11-28 MED ORDER — PREDNISONE 10 MG (21) PO TBPK: ORAL_TABLET | Freq: Every day | ORAL | 0 refills | Status: AC

## 2020-11-28 NOTE — ED Provider Notes (Signed)
[place]   [id number] 11/28/20 Arrival Time: 1125  [id number] THROAT  SUBJECTIVE: History from: patient and family.  Stephanie West is a 80 y.o. female who presents with abrupt onset of nasal congestion, headache, fatigue, SOB, weakness, decreased appetite for the last week. Denies sick exposure to Covid, strep, flu or mono, or precipitating event. Has negative history of Covid. Has tried OTC cough and cold without relief. Weakness and fatigue are worse with activity. Reports previous symptoms in the past.     Denies fever, chills, ear pain, rhinorrhea, cough, chest pain, nausea, rash, changes in bowel or bladder habits.    ROS: As per HPI.  All other pertinent ROS negative.     Past Medical History:  Diagnosis Date  . Asthma   . Dysphagia 05/10/2017  . Elevated transaminase level   . GERD (gastroesophageal reflux disease)   . Hypertension   . MVP (mitral valve prolapse)    Past Surgical History:  Procedure Laterality Date  . COLONOSCOPY    . ESOPHAGEAL DILATION  07/30/2015   Procedure: ESOPHAGEAL DILATION;  Surgeon: Rogene Houston, MD;  [facility];  Service: Endoscopy;;  . ESOPHAGOGASTRODUODENOSCOPY N/A 04/15/2015   Procedure: ESOPHAGOGASTRODUODENOSCOPY (EGD);  Surgeon: Rogene Houston, MD;  [facility];  Service: Endoscopy;  Laterality: N/A;  125 - moved to 1:00, Ann to notify pt  . ESOPHAGOGASTRODUODENOSCOPY N/A 07/30/2015   Procedure: ESOPHAGOGASTRODUODENOSCOPY (EGD);  Surgeon: Rogene Houston, MD;  [facility];  Service: Endoscopy;  Laterality: N/A;  1040  . ESOPHAGOGASTRODUODENOSCOPY (EGD) WITH ESOPHAGEAL DILATION N/A 03/07/2013   Procedure: ESOPHAGOGASTRODUODENOSCOPY (EGD) WITH ESOPHAGEAL DILATION;  Surgeon: Rogene Houston, MD;  [facility];  Service: Endoscopy;  Laterality: N/A;  830  . EYE SURGERY    . LAPAROSCOPIC TUBAL LIGATION  1984  . UPPER GASTROINTESTINAL ENDOSCOPY     Allergies  Allergen Reactions  .  Apple Other (See Comments)    Wheezing and Asthma Symptoms   . Aspirin Other (See Comments)    Wheezing, Asthma Symptoms   . Avelox [Moxifloxacin Hcl In Nacl] Hives    Pt can take cipro  . Bee Venom Other (See Comments)    Wheezing, Asthma Symptoms  . Benadryl [Diphenhydramine Hcl] Other (See Comments)    Wheezing, Asthma Symptoms   . Chicken Allergy Other (See Comments)    Wheezing and Asthma Symptoms   . Codeine Other (See Comments)    Makes patient feel like she's going to pass out.   Haze Boyden [Gemifloxacin Mesylate] Hives  . Factive [Gemifloxacin] Hives  . Hctz [Hydrochlorothiazide]     hyponatremia  . Iodinated Diagnostic Agents Other (See Comments)    Pt was told by allergist to avoid  Dye due to other allergies  . Iodine   . Levaquin [Levofloxacin In D5w] Hives    Pt can take cipro  . Levofloxacin Hives  . Penicillins Other (See Comments)    Asthma Symptoms .Did it involve swelling of the face/tongue/throat, SOB, or low BP? No Did it involve sudden or severe rash/hives, skin peeling, or any reaction on the inside of your mouth or nose? Yes Did you need to seek medical attention at a hospital or doctor's office? Unknown When did it last happen?Unkown If all above answers are "NO", may proceed with cephalosporin use.   . Sulfa Antibiotics Nausea Only  . Tequin [Gatifloxacin] Hives   No current facility-administered medications on file prior to encounter.   Current Outpatient  Medications on File Prior to Encounter  Medication Sig Dispense Refill  . albuterol (PROVENTIL HFA;VENTOLIN HFA) 108 (90 BASE) MCG/ACT inhaler Inhale 2 puffs into the lungs every 6 (six) hours as needed for wheezing or shortness of breath.    . ALPRAZolam (XANAX) 0.25 MG tablet Take 1 tablet by mouth 2 (two) times daily as needed.    Marland Kitchen azelastine (ASTELIN) 0.1 % nasal spray Place 1 spray into both nostrils 2 (two) times daily.    . cloNIDine (CATAPRES) 0.1 MG tablet Take 1 tablet (0.1 mg  total) by mouth 3 (three) times daily. 90 tablet 6  . diltiazem (CARDIZEM CD) 360 MG 24 hr capsule Take 360 mg by mouth daily.    Marland Kitchen esomeprazole (NEXIUM) 40 MG capsule Take 40 mg by mouth daily before breakfast.     . ezetimibe (ZETIA) 10 MG tablet Take 10 mg by mouth daily.    . fish oil-omega-3 fatty acids 1000 MG capsule Take 1 g by mouth 2 (two) times daily.    . Fluticasone-Salmeterol (ADVAIR) 250-50 MCG/DOSE AEPB Inhale 1 puff into the lungs every 12 (twelve) hours.    . furosemide (LASIX) 20 MG tablet Take 1 tablet (20 mg total) by mouth as needed for edema (swelling). 30 tablet 2  . guanFACINE (INTUNIV) 1 MG TB24 ER tablet Take 1 mg by mouth daily.    . hydrALAZINE (APRESOLINE) 25 MG tablet Take 1 tablet (25 mg total) by mouth 3 (three) times daily. 270 tablet 3  . loteprednol (LOTEMAX) 0.2 % SUSP Place 1 drop into both eyes daily.     . metoprolol tartrate (LOPRESSOR) 25 MG tablet Take 0.5 tablets (12.5 mg total) by mouth 2 (two) times daily. 90 tablet 3  . multivitamin-iron-minerals-folic acid (CENTRUM) chewable tablet Chew 1 tablet by mouth daily.    . Netarsudil Dimesylate 0.02 % SOLN Place 1 drop into both eyes every evening.    . olmesartan (BENICAR) 40 MG tablet Take 1 tablet by mouth daily.    . potassium chloride (K-DUR) 10 MEQ tablet Take 10 mEq by mouth 2 (two) times daily.     Social History   Socioeconomic History  . Marital status: Widowed    Spouse name: Not on file  . Number of children: Not on file  . Years of education: Not on file  . Highest education level: Not on file  Occupational History  . Not on file  Tobacco Use  . Smoking status: Never Smoker  . Smokeless tobacco: Never Used  Vaping Use  . Vaping Use: Never used  Substance and Sexual Activity  . Alcohol use: No    Alcohol/week: 0.0 standard drinks  . Drug use: No  . Sexual activity: Not on file  Other Topics Concern  . Not on file  Social History Narrative  . Not on file   Social  Determinants of Health   Financial Resource Strain: Not on file  Food Insecurity: Not on file  Transportation Needs: Not on file  Physical Activity: Not on file  Stress: Not on file  Social Connections: Not on file  Intimate Partner Violence: Not on file   Family History  Problem Relation Age of Onset  . Hypertension Mother   . Prostate cancer Father     OBJECTIVE:  Vitals:   11/28/20 1132  BP: (!) 201/79  Pulse: 84  Resp: 16  Temp: 97.9 F (36.6 C)  TempSrc: Tympanic  SpO2: 95%     General appearance: alert; appears fatigued,  but nontoxic, speaking in full sentences and managing own secretions HEENT: NCAT; Ears: EACs clear, TMs pearly gray with visible cone of light, without erythema; Eyes: PERRL, EOMI grossly; Nose: no obvious rhinorrhea; Throat: oropharynx clear, tonsils 1+ and mildly erythematous without white tonsillar exudates, uvula midline; Sinuses: tender to palpation Neck: supple without LAD Lungs: coarse lung sounds to bilateral lower lobes, diffuse wheezing throughout bilateral lung fields; cough absent Heart: regular rate and rhythm.  Radial pulses 2+ symmetrical bilaterally Skin: warm and dry Psychological: alert and cooperative; normal mood and affect  LABS: No results found for this or any previous visit (from the past 24 hour(s)).   ASSESSMENT & PLAN:  1. Acute non-recurrent pansinusitis   2. Wheezing   3. SOB (shortness of breath)   4. Other fatigue   5. Decreased appetite     Meds ordered this encounter  Medications  . predniSONE (STERAPRED UNI-PAK 21 TAB) 10 MG (21) TBPK tablet    Sig: Take by mouth daily for 6 days. Take 6 tablets on day 1, 5 tablets on day 2, 4 tablets on day 3, 3 tablets on day 4, 2 tablets on day 5, 1 tablet on day 6    Dispense:  21 tablet    Refill:  0    Order Specific Question:   Supervising Provider    Answer:   Chase Picket [id number]  . ciprofloxacin (CIPRO) 500 MG tablet    Sig: Take 1 tablet (500 mg total)  by mouth 2 (two) times daily.    Dispense:  14 tablet    Refill:  0    Order Specific Question:   Supervising Provider    Answer:   Chase Picket [id number]   Chest xray negative for pneumonia Acute Sinusitis Push fluids and get rest Prescribed Cipro BID x 7 days Steroid taper prescribed   Take as directed and to completion.  Drink warm or cool liquids, use throat lozenges, or popsicles to help alleviate symptoms Take OTC ibuprofen or tylenol as needed for pain May use Zyrtec D and flonase to help alleviate symptoms Follow up with PCP if symptoms persist Return or go to ER if you have any new or worsening symptoms such as fever, chills, nausea, vomiting, worsening sore throat, cough, abdominal pain, chest pain, changes in bowel or bladder habits.   Reviewed expectations re: course of current medical issues. Questions answered. Outlined signs and symptoms indicating need for more acute intervention. Patient verbalized understanding. After Visit Summary given.          Faustino Congress, NP 11/28/20 1212

## 2020-11-28 NOTE — ED Triage Notes (Signed)
Nasal congestion with green and yellowish drainage.  Feels light headed and daughter states she is confused from time to time.

## 2020-11-28 NOTE — Discharge Instructions (Addendum)
Chest xray today negative for pneumonia  I have sent in a prednisone taper for you to take for 6 days. 6 tablets on day one, 5 tablets on day two, 4 tablets on day three, 3 tablets on day four, 2 tablets on day five, and 1 tablet on day six.  I have sent in Cipro for you to take twice a day for 7 days  Follow up with this office or with primary care if symptoms are persisting.  Follow up in the ER for high fever, trouble swallowing, trouble breathing, other concerning symptoms.

## 2020-11-29 ENCOUNTER — Telehealth: Payer: Self-pay | Admitting: *Deleted

## 2020-11-29 MED ORDER — HYDRALAZINE HCL 25 MG PO TABS: 37.5000 mg | ORAL_TABLET | Freq: Three times a day (TID) | ORAL | 1 refills | Status: DC

## 2020-11-29 NOTE — Telephone Encounter (Signed)
Pt voiced understanding - updated medication list 

## 2020-11-29 NOTE — Telephone Encounter (Signed)
-----   Message from Arnoldo Lenis, MD sent at 11/29/2020 10:39 AM EST ----- BP's running too high, can we increase hydralazine to 37.5mg  tid  Zandra Abts MD

## 2021-01-04 ENCOUNTER — Encounter: Payer: Self-pay | Admitting: Emergency Medicine

## 2021-01-04 ENCOUNTER — Other Ambulatory Visit: Payer: Self-pay

## 2021-01-04 ENCOUNTER — Ambulatory Visit
Admission: EM | Admit: 2021-01-04 | Discharge: 2021-01-04 | Disposition: A | Discharge status: Home / Self Care | Payer: Medicare Other | Attending: Emergency Medicine | Admitting: Emergency Medicine

## 2021-01-04 DIAGNOSIS — R030 Elevated blood-pressure reading, without diagnosis of hypertension: Secondary | ICD-10-CM

## 2021-01-04 DIAGNOSIS — Z20822 Contact with and (suspected) exposure to covid-19: Secondary | ICD-10-CM | POA: Diagnosis not present

## 2021-01-04 DIAGNOSIS — R0981 Nasal congestion: Secondary | ICD-10-CM

## 2021-01-04 DIAGNOSIS — R062 Wheezing: Secondary | ICD-10-CM

## 2021-01-04 MED ORDER — PREDNISONE 20 MG PO TABS: 20.0000 mg | ORAL_TABLET | Freq: Two times a day (BID) | ORAL | 0 refills | Status: AC

## 2021-01-04 NOTE — ED Provider Notes (Signed)
[place]   [id number] 01/04/21 Arrival Time: 1838   CC: COVID symptoms  SUBJECTIVE: History from: patient.  Stephanie West is a 80 y.o. female who presents with nasal congestion that began this morning.  Denies sick exposure to COVID, flu or strep.  Denies alleviating or aggravating factors.  Reports previous symptoms in the past with sinus infection.   Denies fever, chills,  SOB, wheezing, chest pain, nausea, changes in bowel or bladder habits.    Incidentally blood pressure elevated in office. 201/75 in office.  Has hx of HTN.  Patient states she is nervous.  Currently on medication.  Denies chest pain or SOB.    ROS: As per HPI.  All other pertinent ROS negative.     Past Medical History:  Diagnosis Date  . Asthma   . Dysphagia 05/10/2017  . Elevated transaminase level   . GERD (gastroesophageal reflux disease)   . Hypertension   . MVP (mitral valve prolapse)    Past Surgical History:  Procedure Laterality Date  . COLONOSCOPY    . ESOPHAGEAL DILATION  07/30/2015   Procedure: ESOPHAGEAL DILATION;  Surgeon: Rogene Houston, MD;  [facility];  Service: Endoscopy;;  . ESOPHAGOGASTRODUODENOSCOPY N/A 04/15/2015   Procedure: ESOPHAGOGASTRODUODENOSCOPY (EGD);  Surgeon: Rogene Houston, MD;  [facility];  Service: Endoscopy;  Laterality: N/A;  125 - moved to 1:00, Ann to notify pt  . ESOPHAGOGASTRODUODENOSCOPY N/A 07/30/2015   Procedure: ESOPHAGOGASTRODUODENOSCOPY (EGD);  Surgeon: Rogene Houston, MD;  [facility];  Service: Endoscopy;  Laterality: N/A;  1040  . ESOPHAGOGASTRODUODENOSCOPY (EGD) WITH ESOPHAGEAL DILATION N/A 03/07/2013   Procedure: ESOPHAGOGASTRODUODENOSCOPY (EGD) WITH ESOPHAGEAL DILATION;  Surgeon: Rogene Houston, MD;  [facility];  Service: Endoscopy;  Laterality: N/A;  830  . EYE SURGERY    . LAPAROSCOPIC TUBAL LIGATION  1984  . UPPER GASTROINTESTINAL ENDOSCOPY     Allergies  Allergen Reactions  . Apple  Other (See Comments)    Wheezing and Asthma Symptoms   . Aspirin Other (See Comments)    Wheezing, Asthma Symptoms   . Avelox [Moxifloxacin Hcl In Nacl] Hives    Pt can take cipro  . Bee Venom Other (See Comments)    Wheezing, Asthma Symptoms  . Benadryl [Diphenhydramine Hcl] Other (See Comments)    Wheezing, Asthma Symptoms   . Chicken Allergy Other (See Comments)    Wheezing and Asthma Symptoms   . Codeine Other (See Comments)    Makes patient feel like she's going to pass out.   Haze Boyden [Gemifloxacin Mesylate] Hives  . Factive [Gemifloxacin] Hives  . Hctz [Hydrochlorothiazide]     hyponatremia  . Iodinated Diagnostic Agents Other (See Comments)    Pt was told by allergist to avoid  Dye due to other allergies  . Iodine   . Levaquin [Levofloxacin In D5w] Hives    Pt can take cipro  . Levofloxacin Hives  . Penicillins Other (See Comments)    Asthma Symptoms .Did it involve swelling of the face/tongue/throat, SOB, or low BP? No Did it involve sudden or severe rash/hives, skin peeling, or any reaction on the inside of your mouth or nose? Yes Did you need to seek medical attention at a hospital or doctor's office? Unknown When did it last happen?Unkown If all above answers are "NO", may proceed with cephalosporin use.   . Sulfa Antibiotics Nausea Only  . Tequin [Gatifloxacin] Hives   No current facility-administered medications on file  prior to encounter.   Current Outpatient Medications on File Prior to Encounter  Medication Sig Dispense Refill  . albuterol (PROVENTIL HFA;VENTOLIN HFA) 108 (90 BASE) MCG/ACT inhaler Inhale 2 puffs into the lungs every 6 (six) hours as needed for wheezing or shortness of breath.    . ALPRAZolam (XANAX) 0.25 MG tablet Take 1 tablet by mouth 2 (two) times daily as needed.    Marland Kitchen azelastine (ASTELIN) 0.1 % nasal spray Place 1 spray into both nostrils 2 (two) times daily.    . ciprofloxacin (CIPRO) 500 MG tablet Take 1 tablet (500 mg total) by  mouth 2 (two) times daily. 14 tablet 0  . cloNIDine (CATAPRES) 0.1 MG tablet Take 1 tablet (0.1 mg total) by mouth 3 (three) times daily. 90 tablet 6  . diltiazem (CARDIZEM CD) 360 MG 24 hr capsule Take 360 mg by mouth daily.    Marland Kitchen esomeprazole (NEXIUM) 40 MG capsule Take 40 mg by mouth daily before breakfast.     . ezetimibe (ZETIA) 10 MG tablet Take 10 mg by mouth daily.    . fish oil-omega-3 fatty acids 1000 MG capsule Take 1 g by mouth 2 (two) times daily.    . Fluticasone-Salmeterol (ADVAIR) 250-50 MCG/DOSE AEPB Inhale 1 puff into the lungs every 12 (twelve) hours.    . furosemide (LASIX) 20 MG tablet Take 1 tablet (20 mg total) by mouth as needed for edema (swelling). 30 tablet 2  . guanFACINE (INTUNIV) 1 MG TB24 ER tablet Take 1 mg by mouth daily.    . hydrALAZINE (APRESOLINE) 25 MG tablet Take 1.5 tablets (37.5 mg total) by mouth 3 (three) times daily. 405 tablet 1  . loteprednol (LOTEMAX) 0.2 % SUSP Place 1 drop into both eyes daily.     . metoprolol tartrate (LOPRESSOR) 25 MG tablet Take 0.5 tablets (12.5 mg total) by mouth 2 (two) times daily. 90 tablet 3  . multivitamin-iron-minerals-folic acid (CENTRUM) chewable tablet Chew 1 tablet by mouth daily.    . Netarsudil Dimesylate 0.02 % SOLN Place 1 drop into both eyes every evening.    . olmesartan (BENICAR) 40 MG tablet Take 1 tablet by mouth daily.    . potassium chloride (K-DUR) 10 MEQ tablet Take 10 mEq by mouth 2 (two) times daily.     Social History   Socioeconomic History  . Marital status: Widowed    Spouse name: Not on file  . Number of children: Not on file  . Years of education: Not on file  . Highest education level: Not on file  Occupational History  . Not on file  Tobacco Use  . Smoking status: Never Smoker  . Smokeless tobacco: Never Used  Vaping Use  . Vaping Use: Never used  Substance and Sexual Activity  . Alcohol use: No    Alcohol/week: 0.0 standard drinks  . Drug use: No  . Sexual activity: Not on file   Other Topics Concern  . Not on file  Social History Narrative  . Not on file   Social Determinants of Health   Financial Resource Strain: Not on file  Food Insecurity: Not on file  Transportation Needs: Not on file  Physical Activity: Not on file  Stress: Not on file  Social Connections: Not on file  Intimate Partner Violence: Not on file   Family History  Problem Relation Age of Onset  . Hypertension Mother   . Prostate cancer Father     OBJECTIVE:  Vitals:   01/04/21 1844 01/04/21 1845  BP:  (!) 201/75  Pulse:  (!) 113  Resp: 18   Temp: 97.9 F (36.6 C)   TempSrc: Oral   SpO2:  95%    General appearance: alert; well-appearing, nontoxic; speaking in full sentences and tolerating own secretions HEENT: NCAT; Ears: EACs clear, TMs pearly gray; Eyes: PERRL.  EOM grossly intact.Nose: nares patent without rhinorrhea, Throat: oropharynx clear, tonsils non erythematous or enlarged, uvula midline  Neck: supple without LAD Lungs: unlabored respirations, symmetrical air entry; cough: absent; no respiratory distress; CTAB Heart: regular rate and rhythm.  Skin: warm and dry Psychological: alert and cooperative; anxious mood and affect   ASSESSMENT & PLAN:  1. Exposure to COVID-19 virus   2. Sinus congestion   3. Elevated blood pressure reading     Meds ordered this encounter  Medications  . predniSONE (DELTASONE) 20 MG tablet    Sig: Take 1 tablet (20 mg total) by mouth 2 (two) times daily with a meal for 5 days.    Dispense:  10 tablet    Refill:  0    Order Specific Question:   Supervising Provider    Answer:   Raylene Everts [[id number]]   COVID testing ordered.  It will take between 5-7 days for test results.  Someone will contact you regarding abnormal results.    In the meantime: You should remain isolated in your home for 10 days from symptom onset AND greater than 72 hours after symptoms resolution (absence of fever without the use of fever-reducing  medication and improvement in respiratory symptoms), whichever is longer Get plenty of rest and push fluids Prednisone prescribed.  Take as directed and to completion for congestion and wheezing Use medications daily for symptom relief Use OTC medications like ibuprofen or tylenol as needed fever or pain Call or go to the ED if you have any new or worsening symptoms such as fever, worsening cough, shortness of breath, chest tightness, chest pain, turning blue, changes in mental status, etc...   Blood pressure elevated in office.  Declines clonidine in office.  Please recheck in 24 hours.  If it continues to be greater than 140/90 please follow up with PCP for further evaluation and management.    Reviewed expectations re: course of current medical issues. Questions answered. Outlined signs and symptoms indicating need for more acute intervention. Patient verbalized understanding. After Visit Summary given.         Lestine Box, PA-C 01/04/21 1906

## 2021-01-04 NOTE — Discharge Instructions (Addendum)
COVID testing ordered.  It will take between 5-7 days for test results.  Someone will contact you regarding abnormal results.    In the meantime: You should remain isolated in your home for 10 days from symptom onset AND greater than 72 hours after symptoms resolution (absence of fever without the use of fever-reducing medication and improvement in respiratory symptoms), whichever is longer Get plenty of rest and push fluids Prednisone prescribed.  Take as directed and to completion for congestion and wheezing Use medications daily for symptom relief Use OTC medications like ibuprofen or tylenol as needed fever or pain Call or go to the ED if you have any new or worsening symptoms such as fever, worsening cough, shortness of breath, chest tightness, chest pain, turning blue, changes in mental status, etc...   Blood pressure elevated in office.  Declines clonidine in office.  Please recheck in 24 hours.  If it continues to be greater than 140/90 please follow up with PCP for further evaluation and management.

## 2021-01-04 NOTE — ED Triage Notes (Signed)
Nasal congestion started this morning.  History of sinus surgery.

## 2021-01-05 LAB — NOVEL CORONAVIRUS, NAA: SARS-CoV-2, NAA: NOT DETECTED

## 2021-01-05 LAB — SARS-COV-2, NAA 2 DAY TAT

## 2021-01-06 DIAGNOSIS — I10 Essential (primary) hypertension: Secondary | ICD-10-CM | POA: Diagnosis not present

## 2021-01-06 DIAGNOSIS — J019 Acute sinusitis, unspecified: Secondary | ICD-10-CM | POA: Diagnosis not present

## 2021-01-13 ENCOUNTER — Telehealth: Payer: Self-pay

## 2021-01-13 DIAGNOSIS — I10 Essential (primary) hypertension: Secondary | ICD-10-CM | POA: Diagnosis not present

## 2021-01-13 MED ORDER — METOPROLOL TARTRATE 25 MG PO TABS: 12.5000 mg | ORAL_TABLET | Freq: Two times a day (BID) | ORAL | 3 refills | Status: DC

## 2021-01-13 NOTE — Telephone Encounter (Signed)
Medication refill request for Metoprolol Tartrate (Lopressor) 25 mg tablets approved and sent to [facility].

## 2021-01-17 ENCOUNTER — Encounter (HOSPITAL_COMMUNITY): Payer: Self-pay | Admitting: *Deleted

## 2021-01-17 ENCOUNTER — Other Ambulatory Visit: Payer: Self-pay

## 2021-01-17 ENCOUNTER — Emergency Department (HOSPITAL_COMMUNITY): Payer: Medicare Other

## 2021-01-17 ENCOUNTER — Ambulatory Visit
Admission: EM | Admit: 2021-01-17 | Discharge: 2021-01-17 | Discharge status: Absent without leave | Payer: Medicare Other

## 2021-01-17 ENCOUNTER — Emergency Department (HOSPITAL_COMMUNITY)
Admission: EM | Admit: 2021-01-17 | Discharge: 2021-01-18 | Disposition: A | Discharge status: Home / Self Care | Payer: Medicare Other | Attending: Emergency Medicine | Admitting: Emergency Medicine

## 2021-01-17 DIAGNOSIS — R42 Dizziness and giddiness: Secondary | ICD-10-CM | POA: Insufficient documentation

## 2021-01-17 DIAGNOSIS — J45909 Unspecified asthma, uncomplicated: Secondary | ICD-10-CM | POA: Insufficient documentation

## 2021-01-17 DIAGNOSIS — Z7951 Long term (current) use of inhaled steroids: Secondary | ICD-10-CM | POA: Diagnosis not present

## 2021-01-17 DIAGNOSIS — K449 Diaphragmatic hernia without obstruction or gangrene: Secondary | ICD-10-CM | POA: Diagnosis not present

## 2021-01-17 DIAGNOSIS — I6523 Occlusion and stenosis of bilateral carotid arteries: Secondary | ICD-10-CM | POA: Diagnosis not present

## 2021-01-17 DIAGNOSIS — Z79899 Other long term (current) drug therapy: Secondary | ICD-10-CM | POA: Diagnosis not present

## 2021-01-17 DIAGNOSIS — I129 Hypertensive chronic kidney disease with stage 1 through stage 4 chronic kidney disease, or unspecified chronic kidney disease: Secondary | ICD-10-CM | POA: Insufficient documentation

## 2021-01-17 DIAGNOSIS — R531 Weakness: Secondary | ICD-10-CM | POA: Diagnosis not present

## 2021-01-17 DIAGNOSIS — N183 Chronic kidney disease, stage 3 unspecified: Secondary | ICD-10-CM | POA: Insufficient documentation

## 2021-01-17 DIAGNOSIS — D329 Benign neoplasm of meninges, unspecified: Secondary | ICD-10-CM | POA: Diagnosis not present

## 2021-01-17 DIAGNOSIS — R4182 Altered mental status, unspecified: Secondary | ICD-10-CM | POA: Diagnosis not present

## 2021-01-17 LAB — COMPREHENSIVE METABOLIC PANEL
ALT: 14 U/L (ref 0–44)
AST: 18 U/L (ref 15–41)
Albumin: 4.1 g/dL (ref 3.5–5.0)
Alkaline Phosphatase: 59 U/L (ref 38–126)
Anion gap: 9 (ref 5–15)
BUN: 32 mg/dL — ABNORMAL HIGH (ref 8–23)
CO2: 23 mmol/L (ref 22–32)
Calcium: 9.3 mg/dL (ref 8.9–10.3)
Chloride: 96 mmol/L — ABNORMAL LOW (ref 98–111)
Creatinine, Ser: 1.3 mg/dL — ABNORMAL HIGH (ref 0.44–1.00)
GFR, Estimated: 41 mL/min — ABNORMAL LOW (ref >60)
Glucose, Bld: 107 mg/dL — ABNORMAL HIGH (ref 70–99)
Potassium: 4.1 mmol/L (ref 3.5–5.1)
Sodium: 128 mmol/L — ABNORMAL LOW (ref 135–145)
Total Bilirubin: 0.3 mg/dL (ref 0.3–1.2)
Total Protein: 7.5 g/dL (ref 6.5–8.1)

## 2021-01-17 LAB — CBC WITH DIFFERENTIAL/PLATELET
Abs Immature Granulocytes: 0.03 10*3/uL (ref 0.00–0.07)
Basophils Absolute: 0 10*3/uL (ref 0.0–0.1)
Basophils Relative: 1 %
Eosinophils Absolute: 0 10*3/uL (ref 0.0–0.5)
Eosinophils Relative: 0 %
HCT: 42.5 % (ref 36.0–46.0)
Hemoglobin: 14.5 g/dL (ref 12.0–15.0)
Immature Granulocytes: 0 %
Lymphocytes Relative: 20 %
Lymphs Abs: 1.5 10*3/uL (ref 0.7–4.0)
MCH: 33 pg (ref 26.0–34.0)
MCHC: 34.1 g/dL (ref 30.0–36.0)
MCV: 96.6 fL (ref 80.0–100.0)
Monocytes Absolute: 0.7 10*3/uL (ref 0.1–1.0)
Monocytes Relative: 10 %
Neutro Abs: 5.2 10*3/uL (ref 1.7–7.7)
Neutrophils Relative %: 69 %
Platelets: 269 10*3/uL (ref 150–400)
RBC: 4.4 MIL/uL (ref 3.87–5.11)
RDW: 13 % (ref 11.5–15.5)
WBC: 7.4 10*3/uL (ref 4.0–10.5)
nRBC: 0 % (ref 0.0–0.2)

## 2021-01-17 MED ORDER — DIPHENHYDRAMINE HCL 25 MG PO CAPS: 50.0000 mg | ORAL_CAPSULE | Freq: Once | ORAL | Status: AC

## 2021-01-17 MED ORDER — DIPHENHYDRAMINE HCL 50 MG/ML IJ SOLN
50.0000 mg | Freq: Once | INTRAMUSCULAR | Status: AC
  Administered 2021-01-17: 50 mg via INTRAVENOUS
  Filled 2021-01-17: qty 1

## 2021-01-17 MED ORDER — HYDRALAZINE HCL 25 MG PO TABS
100.0000 mg | ORAL_TABLET | Freq: Once | ORAL | Status: AC
  Administered 2021-01-17: 100 mg via ORAL
  Filled 2021-01-17: qty 4

## 2021-01-17 MED ORDER — SODIUM CHLORIDE 0.9 % IV BOLUS
500.0000 mL | Freq: Once | INTRAVENOUS | Status: AC
  Administered 2021-01-17: 500 mL via INTRAVENOUS

## 2021-01-17 MED ORDER — MECLIZINE HCL 12.5 MG PO TABS
25.0000 mg | ORAL_TABLET | Freq: Once | ORAL | Status: AC
  Administered 2021-01-17: 25 mg via ORAL
  Filled 2021-01-17: qty 2

## 2021-01-17 MED ORDER — MECLIZINE HCL 25 MG PO TABS: 25.0000 mg | ORAL_TABLET | Freq: Three times a day (TID) | ORAL | 0 refills | Status: DC | PRN

## 2021-01-17 MED ORDER — HYDROCORTISONE NA SUCCINATE PF 250 MG IJ SOLR
200.0000 mg | Freq: Once | INTRAMUSCULAR | Status: AC
  Administered 2021-01-17: 200 mg via INTRAVENOUS
  Filled 2021-01-17: qty 200

## 2021-01-17 MED ORDER — GADOBUTROL 1 MMOL/ML IV SOLN
7.0000 mL | Freq: Once | INTRAVENOUS | Status: AC | PRN
  Administered 2021-01-17: 7 mL via INTRAVENOUS

## 2021-01-17 NOTE — ED Provider Notes (Signed)
This patient is an 80 year old female, she is elderly, she is currently treated for hypertension by her family doctor with hydralazine, she had been on 50 mg 3 times daily but recently increased to 100 mg 4 times daily about 8 days ago.  She has been compliant with his medication.  She reports that today she has been more weak and dizzy than usual.  In fact she states she has been fatigued and weak for some time and not feeling well in general but today states that she feels like she is going to pass out when she stands up.  No fevers or chills no nausea or vomiting, she feels like she has some floaters, she feels like maybe there is some vertigo but mostly feels like she is going to pass out when she stands up.  On exam she has clear heart sounds, clear lung sounds, no edema, neurologically she is able to answer all of my questions.  She is able to move all 4 extremities has normal finger-nose-finger, cranial nerves III through XII are normal except for a slight ptosis of her left eyelid.  When questioned she states that this is a chronic finding after having multiple corneal surgeries.  Patient will go for MRI, labs, urinalysis, EKG, the patient is agreeable to the plan.  Medical screening examination/treatment/procedure(s) were conducted as a shared visit with non-physician practitioner(s) and myself.  I personally evaluated the patient during the encounter.  Clinical Impression:   Final diagnoses:  Dizziness  Meningioma [facility])         Noemi Chapel, MD 01/22/21 [phone]

## 2021-01-17 NOTE — ED Notes (Signed)
Pt removed from the cardiac monitor to be taken to MRI. Pt transported over to MRI at this time.

## 2021-01-17 NOTE — ED Provider Notes (Signed)
   Patient signed out to me by Benedetto Goad, PA-C pending completion of work-up.  Patient is an 80 year old female with history of hypertension, mitral valve prolapse here for evaluation of dizziness and describes vertiginous symptoms.  Her hypertension is treated with hydralazine that was recently increased to 100 mg 4 times daily.  Patient and family concerned that this change in her antihypertensive medication could be contributing to her dizziness.  She is well-appearing.  Mentating well without focal neuro deficits on exam.  She had MRI/MRA of the brain earlier today that showed an increasing size of a known hemangioma.  Possible small right sided aneurysms arising from PCA and CTA head neck recommended for further evaluation.  Patient does have a contrast allergy, so will premedicate patient per protocol.    On recheck, BP improved after hydralazine given here.  8  Pt still awaiting CT, discussed with overnight provider, Dr. Malachy Moan who agrees to review imaging and arrange dispo    Kem Parkinson, PA-C 01/17/21 2332    Noemi Chapel, MD 01/22/21 [phone]

## 2021-01-17 NOTE — ED Notes (Signed)
Pt back from MRI. Pt placed back on cardiac monitor.

## 2021-01-17 NOTE — ED Provider Notes (Signed)
[facility] Provider Note   CSN: [id number] Arrival date & time: 01/17/21  1546     History Chief Complaint  Patient presents with  . Dizziness    Stephanie West is a 80 y.o. female.  Stephanie West is a 80 y.o. female with a history of hypertension, mitral valve prolapse, GERD, asthma, who presents to the emergency department for evaluation of dizziness.  Last known normal at 10 PM last night, she does report that she has had some intermittent dizziness next persistent.  She reports constant dizziness that is worse with movement or position change.  Describes it as feeling like she is lightheaded and may pass out but also like the room is spinning or that she is off balance.  She also reports seeing some floaters that made her vision seemed blurry today.  Denies any facial droop or changes in speech.  No focal numbness weakness or tingling in her extremities.  No associated chest pain, shortness of breath, did feel that her heart was racing a bit earlier but her heart rate was normal.  Denies pain elsewhere.  Does report that her blood pressure medications were recently increased and she is now taking hydralazine 100 mg 3 times daily for the past 8 days and wonders if this could be contributing.  No other aggravating relieving factors.        Past Medical History:  Diagnosis Date  . Asthma   . Dysphagia 05/10/2017  . Elevated transaminase level   . GERD (gastroesophageal reflux disease)   . Hypertension   . MVP (mitral valve prolapse)     Patient Active Problem List   Diagnosis Date Noted  . Schatzki's ring 06/15/2020  . Acute hyponatremia 07/02/2019  . Dysphagia 05/10/2017  . Salmonella gastroenteritis 02/11/2016  . Chronic renal failure, stage 3 (moderate) ([place]) 02/10/2016  . Generalized anxiety disorder 02/10/2016  . Acute renal failure (ARF) ([place]) 02/08/2016  . Nausea without vomiting 02/08/2016  . Intractable nausea and vomiting 02/08/2016  .  GERD (gastroesophageal reflux disease) 02/13/2012  . Asthma 02/13/2012  . HTN (hypertension) 02/13/2012  . Transaminitis 02/13/2012    Past Surgical History:  Procedure Laterality Date  . COLONOSCOPY    . ESOPHAGEAL DILATION  07/30/2015   Procedure: ESOPHAGEAL DILATION;  Surgeon: Rogene Houston, MD;  Location: [facility];  Service: Endoscopy;;  . ESOPHAGOGASTRODUODENOSCOPY N/A 04/15/2015   Procedure: ESOPHAGOGASTRODUODENOSCOPY (EGD);  Surgeon: Rogene Houston, MD;  Location: [facility];  Service: Endoscopy;  Laterality: N/A;  125 - moved to 1:00, Ann to notify pt  . ESOPHAGOGASTRODUODENOSCOPY N/A 07/30/2015   Procedure: ESOPHAGOGASTRODUODENOSCOPY (EGD);  Surgeon: Rogene Houston, MD;  Location: [facility];  Service: Endoscopy;  Laterality: N/A;  1040  . ESOPHAGOGASTRODUODENOSCOPY (EGD) WITH ESOPHAGEAL DILATION N/A 03/07/2013   Procedure: ESOPHAGOGASTRODUODENOSCOPY (EGD) WITH ESOPHAGEAL DILATION;  Surgeon: Rogene Houston, MD;  Location: [facility];  Service: Endoscopy;  Laterality: N/A;  830  . EYE SURGERY    . LAPAROSCOPIC TUBAL LIGATION  1984  . UPPER GASTROINTESTINAL ENDOSCOPY       OB History   No obstetric history on file.     Family History  Problem Relation Age of Onset  . Hypertension Mother   . Prostate cancer Father     Social History   Tobacco Use  . Smoking status: Never Smoker  . Smokeless tobacco: Never Used  Vaping Use  . Vaping Use: Never used  Substance Use Topics  .  Alcohol use: No    Alcohol/week: 0.0 standard drinks  . Drug use: No    Home Medications Prior to Admission medications   Medication Sig Start Date End Date Taking? Authorizing Provider  albuterol (PROVENTIL HFA;VENTOLIN HFA) 108 (90 BASE) MCG/ACT inhaler Inhale 2 puffs into the lungs every 6 (six) hours as needed for wheezing or shortness of breath.   Yes [provider]  ALPRAZolam (XANAX) 0.25 MG tablet Take 1 tablet by mouth 2 (two) times daily as needed for  anxiety. 04/30/19  Yes [provider]  ciprofloxacin (CIPRO) 250 MG tablet Take 250 mg by mouth 2 (two) times daily. Starting 3.18.22 x 10 days. 01/07/21  Yes [provider]  cloNIDine (CATAPRES) 0.1 MG tablet Take 1 tablet (0.1 mg total) by mouth 3 (three) times daily. 06/08/20  Yes BranchAlphonse Guild, MD  diltiazem (CARDIZEM CD) 360 MG 24 hr capsule Take 360 mg by mouth daily.   Yes [provider]  esomeprazole (NEXIUM) 40 MG capsule Take 40 mg by mouth daily before breakfast.    Yes [provider]  ezetimibe (ZETIA) 10 MG tablet Take 10 mg by mouth daily.   Yes [provider]  Fluticasone-Salmeterol (ADVAIR) 250-50 MCG/DOSE AEPB Inhale 1 puff into the lungs every 12 (twelve) hours.   Yes [provider]  furosemide (LASIX) 20 MG tablet Take 1 tablet (20 mg total) by mouth as needed for edema (swelling). 07/16/20  Yes BranchAlphonse Guild, MD  guanFACINE (INTUNIV) 1 MG TB24 ER tablet Take 1 mg by mouth daily. 04/01/20  Yes [provider]  hydrALAZINE (APRESOLINE) 25 MG tablet Take 1.5 tablets (37.5 mg total) by mouth 3 (three) times daily. Patient taking differently: Take 100 mg by mouth 3 (three) times daily. 11/29/20 02/27/21 Yes Branch, Alphonse Guild, MD  loteprednol (LOTEMAX) 0.2 % SUSP Place 1 drop into both eyes daily.    Yes [provider]  meclizine (ANTIVERT) 25 MG tablet Take 1 tablet (25 mg total) by mouth 3 (three) times daily as needed for dizziness. 01/17/21  Yes Jacqlyn Larsen, PA-C  Netarsudil Dimesylate 0.02 % SOLN Place 1 drop into both eyes every evening.   Yes [provider]  olmesartan (BENICAR) 40 MG tablet Take 1 tablet by mouth daily. 06/18/19  Yes [provider]  ciprofloxacin (CIPRO) 500 MG tablet Take 1 tablet (500 mg total) by mouth 2 (two) times daily. Patient not taking: No sig reported 11/28/20   Faustino Congress, NP  metoprolol tartrate (LOPRESSOR) 25 MG tablet Take 0.5 tablets (12.5  mg total) by mouth 2 (two) times daily. 01/13/21   Arnoldo Lenis, MD  multivitamin-iron-minerals-folic acid (CENTRUM) chewable tablet Chew 1 tablet by mouth daily.    [provider]    Allergies    Apple, Aspirin, Avelox [moxifloxacin hcl in nacl], Bee venom, Benadryl [diphenhydramine hcl], Chicken allergy, Codeine, Factive [gemifloxacin mesylate], Factive [gemifloxacin], Hctz [hydrochlorothiazide], Iodinated diagnostic agents, Iodine, Levaquin [levofloxacin in d5w], Levofloxacin, Penicillins, Sulfa antibiotics, and Tequin [gatifloxacin]  Review of Systems   Review of Systems  Constitutional: Positive for fatigue. Negative for chills and fever.  HENT: Negative.   Eyes: Positive for visual disturbance.  Respiratory: Negative for cough and shortness of breath.   Cardiovascular: Negative for chest pain.  Gastrointestinal: Negative for abdominal pain, nausea and vomiting.  Genitourinary: Negative for dysuria.  Musculoskeletal: Negative for arthralgias, myalgias and neck pain.  Skin: Negative for color change and rash.  Neurological: Positive for dizziness and light-headedness.  Negative for syncope, facial asymmetry, speech difficulty, numbness and headaches.  All other systems reviewed and are negative.   Physical Exam Updated Vital Signs BP (!) 176/74   Pulse 79   Temp 97.6 F (36.4 C) (Oral)   Resp (!) 23   SpO2 92%   Physical Exam Vitals and nursing note reviewed.  Constitutional:      General: She is not in acute distress.    Appearance: Normal appearance. She is well-developed and normal weight. She is not ill-appearing or diaphoretic.  HENT:     Head: Normocephalic and atraumatic.  Eyes:     General:        Right eye: No discharge.        Left eye: No discharge.     Extraocular Movements: Extraocular movements intact.     Pupils: Pupils are equal, round, and reactive to light.     Comments: No nystagmus  Cardiovascular:     Rate and Rhythm: Normal rate  and regular rhythm.     Pulses: Normal pulses.     Heart sounds: Normal heart sounds. No murmur heard. No friction rub. No gallop.   Pulmonary:     Effort: Pulmonary effort is normal. No respiratory distress.     Breath sounds: Normal breath sounds. No wheezing or rales.     Comments: Respirations equal and unlabored, patient able to speak in full sentences, lungs clear to auscultation bilaterally  Abdominal:     General: Bowel sounds are normal. There is no distension.     Palpations: Abdomen is soft. There is no mass.     Tenderness: There is no abdominal tenderness. There is no guarding.     Comments: Abdomen soft, nondistended, nontender to palpation in all quadrants without guarding or peritoneal signs   Musculoskeletal:        General: No deformity.     Cervical back: Neck supple.     Right lower leg: No edema.     Left lower leg: No edema.  Skin:    General: Skin is warm and dry.     Capillary Refill: Capillary refill takes less than 2 seconds.  Neurological:     Mental Status: She is alert and oriented to person, place, and time.     Coordination: Coordination normal.     Comments: Speech is clear, able to follow commands CN III-XII intact Normal strength in upper and lower extremities bilaterally including dorsiflexion and plantar flexion, strong and equal grip strength Sensation normal to light and sharp touch Moves extremities without ataxia, coordination intact Normal finger to nose and rapid alternating movements No pronator drift  Psychiatric:        Mood and Affect: Mood normal.        Behavior: Behavior normal.     ED Results / Procedures / Treatments   Labs (all labs ordered are listed, but only abnormal results are displayed) Labs Reviewed  COMPREHENSIVE METABOLIC PANEL - Abnormal; Notable for the following components:      Result Value   Sodium 128 (*)    Chloride 96 (*)    Glucose, Bld 107 (*)    BUN 32 (*)    Creatinine, Ser 1.30 (*)    GFR,  Estimated 41 (*)    All other components within normal limits  CBC WITH DIFFERENTIAL/PLATELET  URINALYSIS, ROUTINE W REFLEX MICROSCOPIC    EKG EKG Interpretation  Date/Time:  Monday January 17 2021 16:52:31 EDT Ventricular Rate:  83 PR Interval:  QRS Duration: 74 QT Interval:  362 QTC Calculation: 426 R Axis:   54 Text Interpretation: Sinus rhythm Normal ECG since last tracing no significant change Confirmed by Noemi Chapel [phone]) on 01/17/2021 5:04:24 PM   Radiology MR ANGIO HEAD WO CONTRAST  Result Date: 01/17/2021 CLINICAL DATA:  Dizziness.  Altered mental status and weakness. EXAM: MRI HEAD WITHOUT AND WITH CONTRAST MRA HEAD WITHOUT CONTRAST TECHNIQUE: Multiplanar, multiecho pulse sequences of the brain and surrounding structures were obtained without and with intravenous contrast. Angiographic images of the Circle of Willis were obtained using MRA technique without intravenous contrast. CONTRAST:  60mL GADAVIST GADOBUTROL 1 MMOL/ML IV SOLN COMPARISON:  MRI 11/01/2011.  CT head 11/08/2013. FINDINGS: MRI HEAD FINDINGS Brain: Significantly increased size of a large extra-axial, dural based, homogeneously mass along the left frontal convexity, measuring up to 3.8 by 4.2 x 3.3 cm (AP by transverse by craniocaudal). Smashed demonstrates mild homogeneous restricted diffusion, compatible cellularity. Resulting mass effect on the adjacent frontal lobe with slight rightward midline shift. No substantial surrounding edema. No acute infarct. No hydrocephalus. Patchy white matter hypoattenuation, most likely related to chronic microvascular ischemic disease. No acute hemorrhage. Vascular: Major arterial flow voids are maintained at the skull base. Skull and upper cervical spine: Normal marrow signal. Sinuses/Orbits: Evidence of prior endoscopic sinus surgery. Pansinus mucosal thickening with air-fluid levels in bilateral sphenoid sinuses. Other: No mastoid effusions. MRA HEAD FINDINGS Anterior  circulation: Bilateral ICAs are patent. Bilateral M1 MCAs and proximal M2 MCAs are patent without evidence of proximal hemodynamically significant stenosis. Hypoplastic or absent right A1 ACA with the left A1 ACA supplying bilateral A2 ACAs. No evidence of hemodynamically significant proximal stenosis of the A2 ACAs evaluation distally is limited. Possible superiorly directed aneurysm of the right proximal cavernous ICA versus tortuous vessel. Posterior circulation: Visualized intradural vertebral arteries and basilar artery are patent without evidence of hemodynamically significant stenosis. Bilateral PCAs are patent without evidence of hemodynamically significant stenosis. Right fetal type PCA. Possible small aneurysm in the region of the right P1/P2 PCA (see series 14, image 111). IMPRESSION: MRI: 1. Marked interval increase in size of a 4.2 cm left frontal convexity meningioma, as detailed above. Resulting mass effect on the adjacent frontal lobe with slight rightward midline shift. No substantial surrounding edema. 2. Otherwise, no evidence of acute abnormality.  No acute infarct. 3. Pansinus mucosal thickening with air-fluid levels in bilateral sphenoid sinuses. MRA: 1. Within limitation of noncontrast technique, no evidence of emergent large vessel occlusion or proximal hemodynamically significant stenosis. Limited evaluation of the distal vasculature. 2. Possible small aneurysms arising from the right P1/P2 PCA and right proximal cavernous ICA (versus tortuous vessels). Recommend follow-up CTA to further characterize. Findings and recommendations discussed with Marijean Bravo, [place] via telephone at 6:33 p.m. Electronically Signed   By: Margaretha Sheffield MD   On: 01/17/2021 19:16   MR BRAIN W WO CONTRAST  Result Date: 01/17/2021 CLINICAL DATA:  Dizziness.  Altered mental status and weakness. EXAM: MRI HEAD WITHOUT AND WITH CONTRAST MRA HEAD WITHOUT CONTRAST TECHNIQUE: Multiplanar, multiecho pulse sequences of the  brain and surrounding structures were obtained without and with intravenous contrast. Angiographic images of the Circle of Willis were obtained using MRA technique without intravenous contrast. CONTRAST:  12mL GADAVIST GADOBUTROL 1 MMOL/ML IV SOLN COMPARISON:  MRI 11/01/2011.  CT head 11/08/2013. FINDINGS: MRI HEAD FINDINGS Brain: Significantly increased size of a large extra-axial, dural based, homogeneously mass along the left frontal convexity, measuring up to 3.8 by 4.2 x 3.3  cm (AP by transverse by craniocaudal). Smashed demonstrates mild homogeneous restricted diffusion, compatible cellularity. Resulting mass effect on the adjacent frontal lobe with slight rightward midline shift. No substantial surrounding edema. No acute infarct. No hydrocephalus. Patchy white matter hypoattenuation, most likely related to chronic microvascular ischemic disease. No acute hemorrhage. Vascular: Major arterial flow voids are maintained at the skull base. Skull and upper cervical spine: Normal marrow signal. Sinuses/Orbits: Evidence of prior endoscopic sinus surgery. Pansinus mucosal thickening with air-fluid levels in bilateral sphenoid sinuses. Other: No mastoid effusions. MRA HEAD FINDINGS Anterior circulation: Bilateral ICAs are patent. Bilateral M1 MCAs and proximal M2 MCAs are patent without evidence of proximal hemodynamically significant stenosis. Hypoplastic or absent right A1 ACA with the left A1 ACA supplying bilateral A2 ACAs. No evidence of hemodynamically significant proximal stenosis of the A2 ACAs evaluation distally is limited. Possible superiorly directed aneurysm of the right proximal cavernous ICA versus tortuous vessel. Posterior circulation: Visualized intradural vertebral arteries and basilar artery are patent without evidence of hemodynamically significant stenosis. Bilateral PCAs are patent without evidence of hemodynamically significant stenosis. Right fetal type PCA. Possible small aneurysm in the  region of the right P1/P2 PCA (see series 14, image 111). IMPRESSION: MRI: 1. Marked interval increase in size of a 4.2 cm left frontal convexity meningioma, as detailed above. Resulting mass effect on the adjacent frontal lobe with slight rightward midline shift. No substantial surrounding edema. 2. Otherwise, no evidence of acute abnormality.  No acute infarct. 3. Pansinus mucosal thickening with air-fluid levels in bilateral sphenoid sinuses. MRA: 1. Within limitation of noncontrast technique, no evidence of emergent large vessel occlusion or proximal hemodynamically significant stenosis. Limited evaluation of the distal vasculature. 2. Possible small aneurysms arising from the right P1/P2 PCA and right proximal cavernous ICA (versus tortuous vessels). Recommend follow-up CTA to further characterize. Findings and recommendations discussed with Marijean Bravo, [place] via telephone at 6:33 p.m. Electronically Signed   By: Margaretha Sheffield MD   On: 01/17/2021 19:16   DG Chest Port 1 View  Result Date: 01/17/2021 CLINICAL DATA:  Weakness.  Dizziness EXAM: PORTABLE CHEST 1 VIEW COMPARISON:  Chest x-ray 11/28/2020, CT angiography 03/14/2011 FINDINGS: The heart size and mediastinal contours are unchanged. Aortic arch calcification. Biapical pleural/pulmonary scarring. 6 mm right mid lung zone nodularity. No focal consolidation. No pulmonary edema. No pleural effusion. No pneumothorax. No acute osseous abnormality. Large hiatal hernia. IMPRESSION: 1. 6 mm right mid lung zone nodularity. 2. Otherwise no acute cardiopulmonary abnormality. Electronically Signed   By: Iven Finn M.D.   On: 01/17/2021 18:58    Procedures Procedures   Medications Ordered in ED Medications  diphenhydrAMINE (BENADRYL) capsule 50 mg (has no administration in time range)    Or  diphenhydrAMINE (BENADRYL) injection 50 mg (has no administration in time range)  gadobutrol (GADAVIST) 1 MMOL/ML injection 7 mL (7 mLs Intravenous Contrast Given  01/17/21 1723)  sodium chloride 0.9 % bolus 500 mL (0 mLs Intravenous Stopped 01/17/21 2004)  meclizine (ANTIVERT) tablet 25 mg (25 mg Oral Given 01/17/21 2018)  hydrocortisone sodium succinate (SOLU-CORTEF) injection 200 mg (200 mg Intravenous Given 01/17/21 2044)  hydrALAZINE (APRESOLINE) tablet 100 mg (100 mg Oral Given 01/17/21 2113)    ED Course  I have reviewed the triage vital signs and the nursing notes.  Pertinent labs & imaging results that were available during my care of the patient were reviewed by me and considered in my medical decision making (see chart for details).    MDM Rules/Calculators/A&P  80 y.o. female presents to the ED with complaints of dizziness and lightheadedness, this involves an extensive number of treatment options, and is a complaint that carries with it a high risk of complications and morbidity.  The differential diagnosis includes stroke, peripheral vertigo, vertebral artery dissection, orthostatic hypotension, syncope/near syncope, cardiac arrhythmia, dehydration, electrolyte derangement.  On arrival pt is nontoxic, vitals significant for hypertension with blood pressure 197/73 on arrival, otherwise WNL, patient is not orthostatic.  Exam without focal neurologic deficits or other significant findings  Additional history obtained from daughter who is at bedside. Previous records obtained and reviewed via EMR  I ordered medication IV fluids and meclizine for dizziness  Lab Tests:  I Ordered, reviewed, and interpreted labs, which included:  CBC: No leukocytosis, normal hemoglobin CMP: Mild hyponatremia and hypochloremia likely in the setting of dehydration as patient also has mild elevation in creatinine and BUN, patient given IV fluids here in the ED.  Imaging Studies ordered:  I ordered imaging studies which included MRI and MRA brain, I independently visualized and interpreted imaging, findings detail below   I discussed  imaging results with radiologist, MRI with marked increase in size of previously noted meningioma, was last noted to be 2 cm is now 4.2 cm, noted in the left frontal convexity, with resulting mass-effect on adjacent frontal lobe and slight rightward midline shift but no edema, no other acute abnormality noted within the brain and no evidence of acute infarct.  MRA limited with noncontrast technique, but no obvious LVO or hemodynamically significant stenosis.  Possible small aneurysms arising from the right P1/P2 PCA and right proximal cavernous ICA versus tortuous vessels, recommends follow-up CTA to further characterize this.  Discussed findings of enlarging meningioma with Dr. Ellene Route with neurosurgery, and he recommends outpatient follow-up.  Given potential aneurysmal changes of the posterior circulation in the setting of patient's dizziness and visual changes feel she will need CTA of the head and neck to rule out aneurysm or dissection.  Patient has a contrast allergy so will need premedication with steroids and Benadryl prior to receiving the studies.  Patient has also been given meclizine for her dizziness and her home evening blood pressure medications.  I discussed plan for CTs with patient and daughter and discussed timeline required with premedication.  They expressed understanding.  At shift change care signed out to [place] who will follow up on imaging, if within normal limits patient can be discharged home with meclizine for dizziness and I encouraged her to call to discuss her blood pressure medications with her primary care doctor, hydralazine dosing was recently increased and will but patient remains hypertensive here in the ED today, question whether this could be contributing to her symptoms.  Patient to follow-up with Dr. Ellene Route regarding enlarging meningioma.  Patient discussed with Dr. Sabra Heck, who saw patient as well and agrees with plan.   Final Clinical Impression(s) /  ED Diagnoses Final diagnoses:  Dizziness  Meningioma [facility])    Rx / DC Orders ED Discharge Orders         Ordered    meclizine (ANTIVERT) 25 MG tablet  3 times daily PRN        01/17/21 2051           Janet Berlin 01/17/21 2132    Noemi Chapel, MD 01/22/21 [phone]

## 2021-01-17 NOTE — ED Triage Notes (Signed)
Dizziness onset this am

## 2021-01-17 NOTE — ED Notes (Signed)
Pt's LKW time was 2200 last night 01/16/21. Pt reports she went to bed at 2300 last night and was feeling slightly dizzy then.

## 2021-01-18 DIAGNOSIS — R42 Dizziness and giddiness: Secondary | ICD-10-CM | POA: Diagnosis not present

## 2021-01-18 DIAGNOSIS — I6523 Occlusion and stenosis of bilateral carotid arteries: Secondary | ICD-10-CM | POA: Diagnosis not present

## 2021-01-18 MED ORDER — IOHEXOL 350 MG/ML SOLN
75.0000 mL | Freq: Once | INTRAVENOUS | Status: AC | PRN
  Administered 2021-01-18: 75 mL via INTRAVENOUS

## 2021-01-19 DIAGNOSIS — I10 Essential (primary) hypertension: Secondary | ICD-10-CM | POA: Diagnosis not present

## 2021-01-19 DIAGNOSIS — G9389 Other specified disorders of brain: Secondary | ICD-10-CM | POA: Diagnosis not present

## 2021-01-19 DIAGNOSIS — R42 Dizziness and giddiness: Secondary | ICD-10-CM | POA: Diagnosis not present

## 2021-02-01 DIAGNOSIS — I10 Essential (primary) hypertension: Secondary | ICD-10-CM | POA: Diagnosis not present

## 2021-02-01 DIAGNOSIS — E785 Hyperlipidemia, unspecified: Secondary | ICD-10-CM | POA: Diagnosis not present

## 2021-02-01 DIAGNOSIS — E875 Hyperkalemia: Secondary | ICD-10-CM | POA: Diagnosis not present

## 2021-02-01 DIAGNOSIS — G9389 Other specified disorders of brain: Secondary | ICD-10-CM | POA: Diagnosis not present

## 2021-02-01 DIAGNOSIS — E876 Hypokalemia: Secondary | ICD-10-CM | POA: Diagnosis not present

## 2021-02-01 DIAGNOSIS — H20813 Fuchs' heterochromic cyclitis, bilateral: Secondary | ICD-10-CM | POA: Diagnosis not present

## 2021-02-01 DIAGNOSIS — K219 Gastro-esophageal reflux disease without esophagitis: Secondary | ICD-10-CM | POA: Diagnosis not present

## 2021-02-01 DIAGNOSIS — R2240 Localized swelling, mass and lump, unspecified lower limb: Secondary | ICD-10-CM | POA: Diagnosis not present

## 2021-02-01 DIAGNOSIS — R000 Tachycardia, unspecified: Secondary | ICD-10-CM | POA: Diagnosis not present

## 2021-02-01 DIAGNOSIS — N1832 Chronic kidney disease, stage 3b: Secondary | ICD-10-CM | POA: Diagnosis not present

## 2021-02-01 DIAGNOSIS — R7303 Prediabetes: Secondary | ICD-10-CM | POA: Diagnosis not present

## 2021-02-01 DIAGNOSIS — R42 Dizziness and giddiness: Secondary | ICD-10-CM | POA: Diagnosis not present

## 2021-02-02 ENCOUNTER — Other Ambulatory Visit: Payer: Self-pay

## 2021-02-02 ENCOUNTER — Inpatient Hospital Stay (HOSPITAL_COMMUNITY)
Admission: EM | Admit: 2021-02-02 | Discharge: 2021-02-11 | DRG: 643 | Disposition: A | Discharge status: Home Health | Payer: Medicare Other | Attending: Family Medicine | Admitting: Family Medicine

## 2021-02-02 ENCOUNTER — Encounter (HOSPITAL_COMMUNITY): Payer: Self-pay | Admitting: Emergency Medicine

## 2021-02-02 ENCOUNTER — Emergency Department (HOSPITAL_COMMUNITY): Payer: Medicare Other

## 2021-02-02 DIAGNOSIS — E440 Moderate protein-calorie malnutrition: Secondary | ICD-10-CM | POA: Diagnosis present

## 2021-02-02 DIAGNOSIS — Z885 Allergy status to narcotic agent status: Secondary | ICD-10-CM | POA: Diagnosis not present

## 2021-02-02 DIAGNOSIS — E222 Syndrome of inappropriate secretion of antidiuretic hormone: Secondary | ICD-10-CM | POA: Diagnosis not present

## 2021-02-02 DIAGNOSIS — I160 Hypertensive urgency: Secondary | ICD-10-CM | POA: Diagnosis present

## 2021-02-02 DIAGNOSIS — G9341 Metabolic encephalopathy: Secondary | ICD-10-CM

## 2021-02-02 DIAGNOSIS — E878 Other disorders of electrolyte and fluid balance, not elsewhere classified: Secondary | ICD-10-CM | POA: Diagnosis present

## 2021-02-02 DIAGNOSIS — Z79899 Other long term (current) drug therapy: Secondary | ICD-10-CM

## 2021-02-02 DIAGNOSIS — Z681 Body mass index (BMI) 19 or less, adult: Secondary | ICD-10-CM

## 2021-02-02 DIAGNOSIS — E871 Hypo-osmolality and hyponatremia: Secondary | ICD-10-CM | POA: Diagnosis present

## 2021-02-02 DIAGNOSIS — E860 Dehydration: Secondary | ICD-10-CM | POA: Diagnosis present

## 2021-02-02 DIAGNOSIS — Z8249 Family history of ischemic heart disease and other diseases of the circulatory system: Secondary | ICD-10-CM

## 2021-02-02 DIAGNOSIS — Z888 Allergy status to other drugs, medicaments and biological substances status: Secondary | ICD-10-CM

## 2021-02-02 DIAGNOSIS — Z20822 Contact with and (suspected) exposure to covid-19: Secondary | ICD-10-CM | POA: Diagnosis present

## 2021-02-02 DIAGNOSIS — Z91030 Bee allergy status: Secondary | ICD-10-CM

## 2021-02-02 DIAGNOSIS — R000 Tachycardia, unspecified: Secondary | ICD-10-CM | POA: Diagnosis not present

## 2021-02-02 DIAGNOSIS — Z880 Allergy status to penicillin: Secondary | ICD-10-CM

## 2021-02-02 DIAGNOSIS — R627 Adult failure to thrive: Secondary | ICD-10-CM | POA: Diagnosis present

## 2021-02-02 DIAGNOSIS — I10 Essential (primary) hypertension: Secondary | ICD-10-CM | POA: Diagnosis present

## 2021-02-02 DIAGNOSIS — R630 Anorexia: Secondary | ICD-10-CM

## 2021-02-02 DIAGNOSIS — Z8042 Family history of malignant neoplasm of prostate: Secondary | ICD-10-CM | POA: Diagnosis not present

## 2021-02-02 DIAGNOSIS — Z91018 Allergy to other foods: Secondary | ICD-10-CM

## 2021-02-02 DIAGNOSIS — I341 Nonrheumatic mitral (valve) prolapse: Secondary | ICD-10-CM | POA: Diagnosis present

## 2021-02-02 DIAGNOSIS — Z515 Encounter for palliative care: Secondary | ICD-10-CM | POA: Diagnosis not present

## 2021-02-02 DIAGNOSIS — I169 Hypertensive crisis, unspecified: Secondary | ICD-10-CM | POA: Diagnosis not present

## 2021-02-02 DIAGNOSIS — R0602 Shortness of breath: Secondary | ICD-10-CM | POA: Diagnosis not present

## 2021-02-02 DIAGNOSIS — R17 Unspecified jaundice: Secondary | ICD-10-CM | POA: Diagnosis not present

## 2021-02-02 DIAGNOSIS — F5089 Other specified eating disorder: Secondary | ICD-10-CM | POA: Diagnosis not present

## 2021-02-02 DIAGNOSIS — D329 Benign neoplasm of meninges, unspecified: Secondary | ICD-10-CM | POA: Diagnosis present

## 2021-02-02 DIAGNOSIS — K219 Gastro-esophageal reflux disease without esophagitis: Secondary | ICD-10-CM | POA: Diagnosis present

## 2021-02-02 DIAGNOSIS — F32A Depression, unspecified: Secondary | ICD-10-CM | POA: Diagnosis present

## 2021-02-02 DIAGNOSIS — Z7189 Other specified counseling: Secondary | ICD-10-CM | POA: Diagnosis not present

## 2021-02-02 DIAGNOSIS — J45909 Unspecified asthma, uncomplicated: Secondary | ICD-10-CM | POA: Diagnosis present

## 2021-02-02 DIAGNOSIS — N179 Acute kidney failure, unspecified: Secondary | ICD-10-CM | POA: Diagnosis not present

## 2021-02-02 DIAGNOSIS — F419 Anxiety disorder, unspecified: Secondary | ICD-10-CM | POA: Diagnosis present

## 2021-02-02 DIAGNOSIS — Z881 Allergy status to other antibiotic agents status: Secondary | ICD-10-CM | POA: Diagnosis not present

## 2021-02-02 DIAGNOSIS — Z882 Allergy status to sulfonamides status: Secondary | ICD-10-CM

## 2021-02-02 DIAGNOSIS — Z886 Allergy status to analgesic agent status: Secondary | ICD-10-CM | POA: Diagnosis not present

## 2021-02-02 DIAGNOSIS — R634 Abnormal weight loss: Secondary | ICD-10-CM | POA: Diagnosis not present

## 2021-02-02 DIAGNOSIS — S22050A Wedge compression fracture of T5-T6 vertebra, initial encounter for closed fracture: Secondary | ICD-10-CM | POA: Diagnosis not present

## 2021-02-02 LAB — CBC WITH DIFFERENTIAL/PLATELET
Abs Immature Granulocytes: 0.01 10*3/uL (ref 0.00–0.07)
Basophils Absolute: 0.1 10*3/uL (ref 0.0–0.1)
Basophils Relative: 1 %
Eosinophils Absolute: 0.1 10*3/uL (ref 0.0–0.5)
Eosinophils Relative: 3 %
HCT: 39 % (ref 36.0–46.0)
Hemoglobin: 13.8 g/dL (ref 12.0–15.0)
Immature Granulocytes: 0 %
Lymphocytes Relative: 29 %
Lymphs Abs: 1.3 10*3/uL (ref 0.7–4.0)
MCH: 32 pg (ref 26.0–34.0)
MCHC: 35.4 g/dL (ref 30.0–36.0)
MCV: 90.5 fL (ref 80.0–100.0)
Monocytes Absolute: 0.7 10*3/uL (ref 0.1–1.0)
Monocytes Relative: 15 %
Neutro Abs: 2.2 10*3/uL (ref 1.7–7.7)
Neutrophils Relative %: 52 %
Platelets: 287 10*3/uL (ref 150–400)
RBC: 4.31 MIL/uL (ref 3.87–5.11)
RDW: 11.9 % (ref 11.5–15.5)
WBC: 4.4 10*3/uL (ref 4.0–10.5)
nRBC: 0 % (ref 0.0–0.2)

## 2021-02-02 LAB — COMPREHENSIVE METABOLIC PANEL
ALT: 14 U/L (ref 0–44)
AST: 22 U/L (ref 15–41)
Albumin: 4.2 g/dL (ref 3.5–5.0)
Alkaline Phosphatase: 65 U/L (ref 38–126)
Anion gap: 11 (ref 5–15)
BUN: 14 mg/dL (ref 8–23)
CO2: 22 mmol/L (ref 22–32)
Calcium: 9.2 mg/dL (ref 8.9–10.3)
Chloride: 84 mmol/L — ABNORMAL LOW (ref 98–111)
Creatinine, Ser: 0.98 mg/dL (ref 0.44–1.00)
GFR, Estimated: 58 mL/min — ABNORMAL LOW (ref >60)
Glucose, Bld: 123 mg/dL — ABNORMAL HIGH (ref 70–99)
Potassium: 4.1 mmol/L (ref 3.5–5.1)
Sodium: 117 mmol/L — CL (ref 135–145)
Total Bilirubin: 0.7 mg/dL (ref 0.3–1.2)
Total Protein: 7.5 g/dL (ref 6.5–8.1)

## 2021-02-02 LAB — PHOSPHORUS: Phosphorus: 3.7 mg/dL (ref 2.5–4.6)

## 2021-02-02 LAB — MAGNESIUM: Magnesium: 1.9 mg/dL (ref 1.7–2.4)

## 2021-02-02 LAB — TSH: TSH: 2.491 u[IU]/mL (ref 0.350–4.500)

## 2021-02-02 MED ORDER — ACETAMINOPHEN 325 MG PO TABS
650.0000 mg | ORAL_TABLET | Freq: Four times a day (QID) | ORAL | Status: DC | PRN
  Administered 2021-02-05: 650 mg via ORAL
  Filled 2021-02-02: qty 2

## 2021-02-02 MED ORDER — ENSURE ENLIVE PO LIQD
237.0000 mL | Freq: Two times a day (BID) | ORAL | Status: DC
  Administered 2021-02-03: 237 mL via ORAL

## 2021-02-02 MED ORDER — GUANFACINE HCL ER 1 MG PO TB24
1.0000 mg | ORAL_TABLET | Freq: Every day | ORAL | Status: DC
  Administered 2021-02-03 – 2021-02-11 (×9): 1 mg via ORAL
  Filled 2021-02-02 (×12): qty 1

## 2021-02-02 MED ORDER — SODIUM CHLORIDE 0.9 % IV SOLN: INTRAVENOUS | Status: DC

## 2021-02-02 MED ORDER — CLONIDINE HCL 0.2 MG PO TABS
0.2000 mg | ORAL_TABLET | Freq: Once | ORAL | Status: AC
  Administered 2021-02-02: 0.2 mg via ORAL
  Filled 2021-02-02: qty 1

## 2021-02-02 MED ORDER — DILTIAZEM HCL ER COATED BEADS 180 MG PO CP24: 360.0000 mg | ORAL_CAPSULE | Freq: Every day | ORAL | Status: DC

## 2021-02-02 MED ORDER — HEPARIN SODIUM (PORCINE) 5000 UNIT/ML IJ SOLN
5000.0000 [IU] | Freq: Three times a day (TID) | INTRAMUSCULAR | Status: DC
  Administered 2021-02-03 – 2021-02-11 (×25): 5000 [IU] via SUBCUTANEOUS
  Filled 2021-02-02 (×24): qty 1

## 2021-02-02 MED ORDER — ONDANSETRON HCL 4 MG/2ML IJ SOLN
4.0000 mg | Freq: Four times a day (QID) | INTRAMUSCULAR | Status: DC | PRN
  Administered 2021-02-04: 4 mg via INTRAVENOUS
  Filled 2021-02-02: qty 2

## 2021-02-02 MED ORDER — METOPROLOL TARTRATE 25 MG PO TABS
12.5000 mg | ORAL_TABLET | Freq: Two times a day (BID) | ORAL | Status: DC
  Administered 2021-02-03 – 2021-02-06 (×8): 12.5 mg via ORAL
  Filled 2021-02-02 (×9): qty 1

## 2021-02-02 MED ORDER — EZETIMIBE 10 MG PO TABS
10.0000 mg | ORAL_TABLET | Freq: Every day | ORAL | Status: DC
  Administered 2021-02-03 – 2021-02-11 (×9): 10 mg via ORAL
  Filled 2021-02-02 (×9): qty 1

## 2021-02-02 MED ORDER — ACETAMINOPHEN 650 MG RE SUPP: 650.0000 mg | Freq: Four times a day (QID) | RECTAL | Status: DC | PRN

## 2021-02-02 MED ORDER — DILTIAZEM HCL 30 MG PO TABS
60.0000 mg | ORAL_TABLET | Freq: Once | ORAL | Status: AC
  Administered 2021-02-02: 60 mg via ORAL
  Filled 2021-02-02: qty 2

## 2021-02-02 MED ORDER — MOMETASONE FURO-FORMOTEROL FUM 200-5 MCG/ACT IN AERO
2.0000 | INHALATION_SPRAY | Freq: Two times a day (BID) | RESPIRATORY_TRACT | Status: DC
  Administered 2021-02-03 – 2021-02-11 (×17): 2 via RESPIRATORY_TRACT
  Filled 2021-02-02: qty 8.8

## 2021-02-02 MED ORDER — TRAMADOL HCL 50 MG PO TABS: 50.0000 mg | ORAL_TABLET | Freq: Four times a day (QID) | ORAL | Status: DC | PRN

## 2021-02-02 MED ORDER — CLONIDINE HCL 0.1 MG PO TABS
0.1000 mg | ORAL_TABLET | Freq: Three times a day (TID) | ORAL | Status: DC
  Administered 2021-02-03 – 2021-02-09 (×21): 0.1 mg via ORAL
  Filled 2021-02-02 (×23): qty 1

## 2021-02-02 MED ORDER — ALBUTEROL SULFATE HFA 108 (90 BASE) MCG/ACT IN AERS: 2.0000 | INHALATION_SPRAY | Freq: Four times a day (QID) | RESPIRATORY_TRACT | Status: DC | PRN

## 2021-02-02 MED ORDER — PANTOPRAZOLE SODIUM 40 MG PO TBEC
40.0000 mg | DELAYED_RELEASE_TABLET | Freq: Every day | ORAL | Status: DC
  Administered 2021-02-03 – 2021-02-11 (×9): 40 mg via ORAL
  Filled 2021-02-02 (×9): qty 1

## 2021-02-02 MED ORDER — ONDANSETRON HCL 4 MG PO TABS: 4.0000 mg | ORAL_TABLET | Freq: Four times a day (QID) | ORAL | Status: DC | PRN

## 2021-02-02 MED ORDER — LOTEPREDNOL ETABONATE 0.2 % OP SUSP
1.0000 [drp] | Freq: Every day | OPHTHALMIC | Status: DC
  Administered 2021-02-04 – 2021-02-06 (×3): 1 [drp] via OPHTHALMIC

## 2021-02-02 MED ORDER — ALPRAZOLAM 0.25 MG PO TABS
0.2500 mg | ORAL_TABLET | Freq: Two times a day (BID) | ORAL | Status: DC | PRN
  Administered 2021-02-03: 0.25 mg via ORAL
  Filled 2021-02-02 (×3): qty 1

## 2021-02-02 MED ORDER — HYDRALAZINE HCL 25 MG PO TABS
100.0000 mg | ORAL_TABLET | Freq: Three times a day (TID) | ORAL | Status: DC
  Administered 2021-02-03 – 2021-02-11 (×24): 100 mg via ORAL
  Filled 2021-02-02 (×26): qty 4

## 2021-02-02 MED ORDER — NETARSUDIL DIMESYLATE 0.02 % OP SOLN
1.0000 [drp] | Freq: Every evening | OPHTHALMIC | Status: DC
  Administered 2021-02-02 – 2021-02-10 (×9): 1 [drp] via OPHTHALMIC
  Filled 2021-02-02 (×2): qty 1

## 2021-02-02 NOTE — H&P (Signed)
TRH H&P    Patient Demographics:    Stephanie West, is a 80 y.o. female  MRN: [id number]  DOB - 10-28-1940  Admit Date - 02/02/2021  Referring MD/NP/PA: Sabra Heck  Outpatient Primary MD for the patient is Celene Squibb, MD  Patient coming from: home  Chief complaint- low sodium   HPI:    Stephanie West  is a 80 y.o. female, with history of mitral valve prolapse, hypertension, GERD, asthma, and more presents to the ED with a chief complaint of low sodium.  Patient had presented to her PCP for bilateral arm tremors, generalized weakness, fatigue blood work was done that showed a sodium of 117.  Patient usually runs a slightly low between 126 and 132, but this was low even for her.  Unfortunately patient is not able to give much history because she is confused.  Daughter, [name], is at bedside and reports that patient has not been eating for last 20 days.  She reports that patient was in the ER on the 28th complaining of dizziness and confusion, and she was found to be dehydrated.  At that time her sodium was 128.  Since then family has been trying to make her eat and drink more.  Daughter reports that patient has glasses of water all over the house but she will only take a sip from each 1.  She is lucky if she eats half a piece of cheese toast or half a peanut butter and jelly for a day.  She is seen her PCP for dizziness and was started on meclizine twice daily x7 days.  With patient's increasing confusion she is not sure if she is taking her medications or not, so family was planning to start helping her with that this evening.  Patient does live alone.  She is normally able to ambulate on her own, but since this generalized weakness has been progressing daughter his get the walker out for her again.  Of note patient lost her husband 2 years ago and has been depressed.  She does not talk to her PCP about this.  She has not been  started on any antidepressant medications or any other new medications aside from the meclizine.  During my exam, patient does know her name, where she is, the year, the president.  She does not know why she is here, when trying to give history she reports that she called daughter, and daughter reports that that is not accurate.  She tries to guess at her complaints, but is clear that she is unsure, daughter reports that she has not had the complaints she is guessing at.  During her time the ER her blood pressure was very high at 220/89.  This is likely secondary to medication regimen nonadherence given her confusion, and the fact that she is unsure about when she last took her medications.  Patient does not smoke, does not drink alcohol.  Patient is vaccinated for COVID, which daughter confirms.  Patient remains full code.  In the ED Temp 98, respiratory 20, heart rate  96, blood pressure 220/89 No leukocytosis with a white blood cell count of 4.4, hemoglobin 13.8 Chemistry panel reveals hyponatremia 117, hyperglycemia at 123 Chest x-ray shows no cardiopulmonary acute disease EKG shows a heart rate of 101, sinus tach, QTC 442 ER gave patient's home medications diltiazem 60 mg p.o. and clonidine 0.2 mg which improved blood pressure to systolic 027X Normal saline was started on 100 mL/h Admission requested for further management of acute hyponatremia    Review of systems:    Review of systems could not be obtained secondary to patient's altered mental status    Past History of the following :    Past Medical History:  Diagnosis Date  . Asthma   . Dysphagia 05/10/2017  . Elevated transaminase level   . GERD (gastroesophageal reflux disease)   . Hypertension   . MVP (mitral valve prolapse)       Past Surgical History:  Procedure Laterality Date  . COLONOSCOPY    . ESOPHAGEAL DILATION  07/30/2015   Procedure: ESOPHAGEAL DILATION;  Surgeon: Rogene Houston, MD;  [facility];  Service: Endoscopy;;  . ESOPHAGOGASTRODUODENOSCOPY N/A 04/15/2015   Procedure: ESOPHAGOGASTRODUODENOSCOPY (EGD);  Surgeon: Rogene Houston, MD;  Location: [facility];  Service: Endoscopy;  Laterality: N/A;  125 - moved to 1:00, Ann to notify pt  . ESOPHAGOGASTRODUODENOSCOPY N/A 07/30/2015   Procedure: ESOPHAGOGASTRODUODENOSCOPY (EGD);  Surgeon: Rogene Houston, MD;  [facility];  Service: Endoscopy;  Laterality: N/A;  1040  . ESOPHAGOGASTRODUODENOSCOPY (EGD) WITH ESOPHAGEAL DILATION N/A 03/07/2013   Procedure: ESOPHAGOGASTRODUODENOSCOPY (EGD) WITH ESOPHAGEAL DILATION;  Surgeon: Rogene Houston, MD;  Location[facility];  Service: Endoscopy;  Laterality: N/A;  830  . EYE SURGERY    . LAPAROSCOPIC TUBAL LIGATION  1984  . UPPER GASTROINTESTINAL ENDOSCOPY        Social History:      Social History   Tobacco Use  . Smoking status: Never Smoker  . Smokeless tobacco: Never Used  Substance Use Topics  . Alcohol use: No    Alcohol/week: 0.0 standard drinks       Family History :     Family History  Problem Relation Age of Onset  . Hypertension Mother   . Prostate cancer Father       Home Medications:   Prior to Admission medications   Medication Sig Start Date End Date Taking? Authorizing Provider  albuterol (PROVENTIL HFA;VENTOLIN HFA) 108 (90 BASE) MCG/ACT inhaler Inhale 2 puffs into the lungs every 6 (six) hours as needed for wheezing or shortness of breath.   Yes [provider]  ALPRAZolam (XANAX) 0.25 MG tablet Take 1 tablet by mouth 2 (two) times daily as needed for anxiety. 04/30/19  Yes [provider]  cloNIDine (CATAPRES) 0.1 MG tablet Take 1 tablet (0.1 mg total) by mouth 3 (three) times daily. 06/08/20  Yes BranchAlphonse Guild, MD  diltiazem (CARDIZEM CD) 360 MG 24 hr capsule Take 360 mg by mouth daily.   Yes [provider]  esomeprazole (NEXIUM) 40 MG capsule Take 40 mg by mouth daily before breakfast.    Yes [provider]  ezetimibe (ZETIA) 10 MG tablet Take 10 mg by mouth daily.   Yes [provider]  Fluticasone-Salmeterol (ADVAIR) 250-50 MCG/DOSE AEPB Inhale 1 puff into the lungs every 12 (twelve) hours.   Yes [provider]  guanFACINE (INTUNIV) 1 MG TB24 ER tablet Take 1 mg by mouth daily. 04/01/20  Yes  [provider]  hydrALAZINE (APRESOLINE) 25 MG tablet Take 1.5 tablets (37.5 mg total) by mouth 3 (three) times daily. Patient taking differently: Take 100 mg by mouth 3 (three) times daily. 11/29/20 02/27/21 Yes Branch, Alphonse Guild, MD  loteprednol (LOTEMAX) 0.2 % SUSP Place 1 drop into both eyes daily.    Yes [provider]  meclizine (ANTIVERT) 25 MG tablet Take 1 tablet (25 mg total) by mouth 3 (three) times daily as needed for dizziness. Patient taking differently: Take 25 mg by mouth daily as needed for dizziness. 01/17/21  Yes Jacqlyn Larsen, PA-C  Netarsudil Dimesylate 0.02 % SOLN Place 1 drop into both eyes every evening.   Yes [provider]  olmesartan (BENICAR) 40 MG tablet Take 1 tablet by mouth daily. 06/18/19  Yes [provider]  potassium chloride (KLOR-CON) 10 MEQ tablet Take 10 mEq by mouth 3 (three) times daily. 01/14/21  Yes [provider]  ciprofloxacin (CIPRO) 250 MG tablet Take 250 mg by mouth 2 (two) times daily. Starting 3.18.22 x 10 days. Patient not taking: No sig reported 01/07/21   [provider]  ciprofloxacin (CIPRO) 500 MG tablet Take 1 tablet (500 mg total) by mouth 2 (two) times daily. Patient not taking: No sig reported 11/28/20   Faustino Congress, NP  fluocinonide cream (LIDEX) 0.05 %  01/14/21   [provider]  furosemide (LASIX) 20 MG tablet Take 1 tablet (20 mg total) by mouth as needed for edema (swelling). Patient not taking: Reported on 02/02/2021 07/16/20   Arnoldo Lenis, MD  metoprolol tartrate (LOPRESSOR) 25 MG tablet Take 0.5 tablets (12.5 mg total) by mouth 2 (two)  times daily. Patient not taking: Reported on 02/02/2021 01/13/21   Arnoldo Lenis, MD  multivitamin-iron-minerals-folic acid (CENTRUM) chewable tablet Chew 1 tablet by mouth daily. Patient not taking: Reported on 02/02/2021    [provider]     Allergies:     Allergies  Allergen Reactions  . Apple Other (See Comments)    Wheezing and Asthma Symptoms   . Aspirin Other (See Comments)    Wheezing, Asthma Symptoms   . Avelox [Moxifloxacin Hcl In Nacl] Hives    Pt can take cipro  . Bee Venom Other (See Comments)    Wheezing, Asthma Symptoms  . Benadryl [Diphenhydramine Hcl] Other (See Comments)    Wheezing, Asthma Symptoms   . Chicken Allergy Other (See Comments)    Wheezing and Asthma Symptoms   . Codeine Other (See Comments)    Makes patient feel like she's going to pass out.   Haze Boyden [Gemifloxacin Mesylate] Hives  . Factive [Gemifloxacin] Hives  . Hctz [Hydrochlorothiazide]     hyponatremia  . Iodinated Diagnostic Agents Other (See Comments)    Pt was told by allergist to avoid  Dye due to other allergies  . Iodine   . Levaquin [Levofloxacin In D5w] Hives    Pt can take cipro  . Levofloxacin Hives  . Penicillins Other (See Comments)    Asthma Symptoms .Did it involve swelling of the face/tongue/throat, SOB, or low BP? No Did it involve sudden or severe rash/hives, skin peeling, or any reaction on the inside of your mouth or nose? Yes Did you need to seek medical attention at a hospital or doctor's office? Unknown When did it last happen?Unkown If all above answers are "NO", may proceed with cephalosporin use.   . Sulfa Antibiotics Nausea Only  . Tequin [Gatifloxacin] Hives     Physical  Exam:   Vitals  Blood pressure (!) 142/74, pulse 83, temperature 98.7 F (37.1 C), temperature source Oral, resp. rate 19, SpO2 96 %.  1.  General: Patient is lying supine in bed with head of bed elevated no acute distress  2. Psychiatric: Alert, oriented x3,  confused about history, flat affect, pleasant, cooperative with exam  3. Neurologic: Cranial nerves II through XII intact, moves all 4 extremities voluntarily with generalized weakness, no asymmetric weakness, speech and language are normal, coordination is intact  4. HEENMT:  Head is atraumatic, normocephalic, pupils reactive to light, neck is supple, trachea is midline, mucous membranes are mildly dry  5. Respiratory : Lungs are clear to auscultation bilaterally without wheezing, rhonchi, rales, no cyanosis, no clubbing  6. Cardiovascular : Heart rate is normal, rhythm is regular, systolic murmur present, no rubs or gallops, no peripheral edema, peripheral pulses palpated  7. Gastrointestinal:  Abdomen is flat, nondistended, nontender to palpation, bowel sounds active  8. Skin:  Skin is warm dry and intact without acute lesion on limited exam  9.Musculoskeletal:  No acute deformity, peripheral pulses palpated, no calf tenderness    Data Review:    CBC Recent Labs  Lab 02/02/21 1757  WBC 4.4  HGB 13.8  HCT 39.0  PLT 287  MCV 90.5  MCH 32.0  MCHC 35.4  RDW 11.9  LYMPHSABS 1.3  MONOABS 0.7  EOSABS 0.1  BASOSABS 0.1   ------------------------------------------------------------------------------------------------------------------  Results for orders placed or performed during the hospital encounter of 02/02/21 (from the past 48 hour(s))  CBC with Differential/Platelet     Status: None   Collection Time: 02/02/21  5:57 PM  Result Value Ref Range   WBC 4.4 4.0 - 10.5 K/uL   RBC 4.31 3.87 - 5.11 MIL/uL   Hemoglobin 13.8 12.0 - 15.0 g/dL   HCT 39.0 36.0 - 46.0 %   MCV 90.5 80.0 - 100.0 fL   MCH 32.0 26.0 - 34.0 pg   MCHC 35.4 30.0 - 36.0 g/dL   RDW 11.9 11.5 - 15.5 %   Platelets 287 150 - 400 K/uL   nRBC 0.0 0.0 - 0.2 %   Neutrophils Relative % 52 %   Neutro Abs 2.2 1.7 - 7.7 K/uL   Lymphocytes Relative 29 %   Lymphs Abs 1.3 0.7 - 4.0 K/uL   Monocytes  Relative 15 %   Monocytes Absolute 0.7 0.1 - 1.0 K/uL   Eosinophils Relative 3 %   Eosinophils Absolute 0.1 0.0 - 0.5 K/uL   Basophils Relative 1 %   Basophils Absolute 0.1 0.0 - 0.1 K/uL   Immature Granulocytes 0 %   Abs Immature Granulocytes 0.01 0.00 - 0.07 K/uL    Comment: Performed at [facility] [address] [place], [place] [address]  Comprehensive metabolic panel     Status: Abnormal   Collection Time: 02/02/21  5:57 PM  Result Value Ref Range   Sodium 117 (LL) 135 - 145 mmol/L    Comment: CRITICAL RESULT CALLED TO, READ BACK BY AND VERIFIED WITH: WHITE,M RN @1855  02/01/21 BILLINGSLEY,L    Potassium 4.1 3.5 - 5.1 mmol/L   Chloride 84 (L) 98 - 111 mmol/L   CO2 22 22 - 32 mmol/L   Glucose, Bld 123 (H) 70 - 99 mg/dL    Comment: Glucose reference range applies only to samples taken after fasting for at least 8 hours.   BUN 14 8 - 23 mg/dL   Creatinine, Ser 0.98 0.44 - 1.00 mg/dL  Calcium 9.2 8.9 - 10.3 mg/dL   Total Protein 7.5 6.5 - 8.1 g/dL   Albumin 4.2 3.5 - 5.0 g/dL   AST 22 15 - 41 U/L   ALT 14 0 - 44 U/L   Alkaline Phosphatase 65 38 - 126 U/L   Total Bilirubin 0.7 0.3 - 1.2 mg/dL   GFR, Estimated 58 (L) >60 mL/min    Comment: (NOTE) Calculated using the CKD-EPI Creatinine Equation (2021)    Anion gap 11 5 - 15    Comment: Performed at [facility], [address] [place], [place] [address]  Magnesium     Status: None   Collection Time: 02/02/21  5:57 PM  Result Value Ref Range   Magnesium 1.9 1.7 - 2.4 mg/dL    Comment: Performed at [facility], [address] [place], [place] [address]  Phosphorus     Status: None   Collection Time: 02/02/21  5:57 PM  Result Value Ref Range   Phosphorus 3.7 2.5 - 4.6 mg/dL    Comment: Performed at [facility], [address] [place], [place] [address]  TSH     Status: None   Collection Time: 02/02/21  5:57 PM  Result Value Ref Range   TSH 2.491 0.350 - 4.500 uIU/mL    Comment: Performed by a 3rd Generation assay  with a functional sensitivity of <=0.01 uIU/mL. Performed at [facility], [address], [place], [place] [address]     Chemistries  Recent Labs  Lab 02/02/21 1757  NA 117*  K 4.1  CL 84*  CO2 22  GLUCOSE 123*  BUN 14  CREATININE 0.98  CALCIUM 9.2  MG 1.9  AST 22  ALT 14  ALKPHOS 65  BILITOT 0.7   ------------------------------------------------------------------------------------------------------------------  ------------------------------------------------------------------------------------------------------------------ GFR: CrCl cannot be calculated (Unknown ideal weight.). Liver Function Tests: Recent Labs  Lab 02/02/21 1757  AST 22  ALT 14  ALKPHOS 65  BILITOT 0.7  PROT 7.5  ALBUMIN 4.2   No results for input(s): LIPASE, AMYLASE in the last 168 hours. No results for input(s): AMMONIA in the last 168 hours. Coagulation Profile: No results for input(s): INR, PROTIME in the last 168 hours. Cardiac Enzymes: No results for input(s): CKTOTAL, CKMB, CKMBINDEX, TROPONINI in the last 168 hours. BNP (last 3 results) No results for input(s): PROBNP in the last 8760 hours. HbA1C: No results for input(s): HGBA1C in the last 72 hours. CBG: No results for input(s): GLUCAP in the last 168 hours. Lipid Profile: No results for input(s): CHOL, HDL, LDLCALC, TRIG, CHOLHDL, LDLDIRECT in the last 72 hours. Thyroid Function Tests: Recent Labs    02/02/21 1757  TSH 2.491   Anemia Panel: No results for input(s): VITAMINB12, FOLATE, FERRITIN, TIBC, IRON, RETICCTPCT in the last 72 hours.  --------------------------------------------------------------------------------------------------------------- Urine analysis:    Component Value Date/Time   COLORURINE YELLOW 07/02/2019 0800   APPEARANCEUR HAZY (A) 07/02/2019 0800   LABSPEC 1.010 07/02/2019 0800   PHURINE 7.0 07/02/2019 0800   GLUCOSEU NEGATIVE 07/02/2019 0800   HGBUR NEGATIVE 07/02/2019 0800   BILIRUBINUR  NEGATIVE 07/02/2019 0800   KETONESUR NEGATIVE 07/02/2019 0800   PROTEINUR 30 (A) 07/02/2019 0800   UROBILINOGEN 0.2 07/03/2015 2125   NITRITE NEGATIVE 07/02/2019 0800   LEUKOCYTESUR NEGATIVE 07/02/2019 0800      Imaging Results:    DG Chest Port 1 View  Result Date: 02/02/2021 CLINICAL DATA:  Shortness of breath. EXAM: PORTABLE CHEST 1 VIEW COMPARISON:  January 17, 2021. FINDINGS: The heart size and mediastinal  contours are within normal limits. Stable right basilar scarring is noted. No acute pulmonary disease is noted. No pneumothorax or pleural effusion is noted. The visualized skeletal structures are unremarkable. IMPRESSION: No acute cardiopulmonary abnormality seen. Aortic Atherosclerosis (ICD10-I70.0). Electronically Signed   By: Marijo Conception M.D.   On: 02/02/2021 18:01       Assessment & Plan:    Principal Problem:   Acute hyponatremia Active Problems:   Hypertensive crisis   Hypochloremia   Anxiety   Acute metabolic encephalopathy   1. Acute hyponatremia/hypochloremia 1. Most likely secondary to poor p.o. intake 2. Urine electrolytes pending 3. Hold Lasix 4. Normal saline at 100 mL/h 5. Check BMP every 8 hours 6. Monitor on telemetry 2. Generalized weakness 1. Most likely secondary to above and deconditioning 2. Treatment as above 3. Consult PT eval and treat 3. Acute metabolic encephalopathy 1. Secondary to #1 2. TSH checked in the ED= 2.491 3. Hold off on further work-up unless patient does not improve as expected with treatment of hyponatremia 4. Hypertensive crisis 1. Secondary to medication regimen nonadherence 2. Continue diltiazem, clonidine, metoprolol, hydralazine 3. Hold ARB given that it is mostly related to hyponatremia 4. Continue to monitor 5. Anxiety 1. Continue Ativan as needed    DVT Prophylaxis-Heparin- SCDs  AM Labs Ordered, also please review Full Orders  Family Communication: Admission, patients condition and plan of care  including tests being ordered have been discussed with the patient and daughter, [name] who indicate understanding and agree with the plan and Code Status.  Code Status: Full Admission status: Inpatient :The appropriate admission status for this patient is INPATIENT. Inpatient status is judged to be reasonable and necessary in order to provide the required intensity of service to ensure the patient's safety. The patient's presenting symptoms, physical exam findings, and initial radiographic and laboratory data in the context of their chronic comorbidities is felt to place them at high risk for further clinical deterioration. Furthermore, it is not anticipated that the patient will be medically stable for discharge from the hospital within 2 midnights of admission. The following factors support the admission status of inpatient.     The patient's presenting symptoms include confusion, low sodium. The worrisome physical exam findings include generalized weakness, hypertensive crisis  the initial radiographic and laboratory data are worrisome because of sodium 117  the chronic co-morbidities include hypertension, GERD     * I certify that at the point of admission it is my clinical judgment that the patient will require inpatient hospital care spanning beyond 2 midnights from the point of admission due to high intensity of service, high risk for further deterioration and high frequency of surveillance required.*  Time spent in minutes : [place]

## 2021-02-02 NOTE — ED Triage Notes (Signed)
Sent by Dr Durene Cal office for sodium level 119 . Pt reports rapid heart rate and confusion.

## 2021-02-02 NOTE — ED Provider Notes (Signed)
[facility] EMERGENCY DEPARTMENT Provider Note   CSN: [id number] Arrival date & time: 02/02/21  1645     History Chief Complaint  Patient presents with  . Abnormal Lab    Stephanie West is a 80 y.o. female.  HPI   .  This patient has a history of hypertension, she has a history of asthma, she lives by herself, she has also had a history of hyponatremia.  My review of the medical record shows that the patient was seen in the emergency department on 28 March approximately 2 weeks ago because of dizziness she has been diagnosed with acute pansinusitis in the urgent care on February 6, treated by the cardiologist on January 24 of 2022 for hypertension, and evaluation of her sodium levels going back 1 year shows that they are usually between 128 and 132.  Reportedly the patient had a sodium of 119 at the office of Dr. Nevada Crane, she was referred to the emergency department.  The patient reports her symptoms are palpitations, she feels confused, she cannot give me any other history because of her confusion, level 5 caveat applies.  Report from prehospital setting is that the patient had been having some tremors leading up to the need for blood work.  Past Medical History:  Diagnosis Date  . Asthma   . Dysphagia 05/10/2017  . Elevated transaminase level   . GERD (gastroesophageal reflux disease)   . Hypertension   . MVP (mitral valve prolapse)     Patient Active Problem List   Diagnosis Date Noted  . Schatzki's ring 06/15/2020  . Acute hyponatremia 07/02/2019  . Dysphagia 05/10/2017  . Salmonella gastroenteritis 02/11/2016  . Chronic renal failure, stage 3 (moderate) ([place]) 02/10/2016  . Generalized anxiety disorder 02/10/2016  . Acute renal failure (ARF) ([place]) 02/08/2016  . Nausea without vomiting 02/08/2016  . Intractable nausea and vomiting 02/08/2016  . GERD (gastroesophageal reflux disease) 02/13/2012  . Asthma 02/13/2012  . HTN (hypertension) 02/13/2012  . Transaminitis  02/13/2012    Past Surgical History:  Procedure Laterality Date  . COLONOSCOPY    . ESOPHAGEAL DILATION  07/30/2015   Procedure: ESOPHAGEAL DILATION;  Surgeon: Rogene Houston, MD;  Location: [facility];  Service: Endoscopy;;  . ESOPHAGOGASTRODUODENOSCOPY N/A 04/15/2015   Procedure: ESOPHAGOGASTRODUODENOSCOPY (EGD);  Surgeon: Rogene Houston, MD;  Location: [facility];  Service: Endoscopy;  Laterality: N/A;  125 - moved to 1:00, Ann to notify pt  . ESOPHAGOGASTRODUODENOSCOPY N/A 07/30/2015   Procedure: ESOPHAGOGASTRODUODENOSCOPY (EGD);  Surgeon: Rogene Houston, MD;  Location: [facility];  Service: Endoscopy;  Laterality: N/A;  1040  . ESOPHAGOGASTRODUODENOSCOPY (EGD) WITH ESOPHAGEAL DILATION N/A 03/07/2013   Procedure: ESOPHAGOGASTRODUODENOSCOPY (EGD) WITH ESOPHAGEAL DILATION;  Surgeon: Rogene Houston, MD;  Location: [facility];  Service: Endoscopy;  Laterality: N/A;  830  . EYE SURGERY    . LAPAROSCOPIC TUBAL LIGATION  1984  . UPPER GASTROINTESTINAL ENDOSCOPY       OB History   No obstetric history on file.     Family History  Problem Relation Age of Onset  . Hypertension Mother   . Prostate cancer Father     Social History   Tobacco Use  . Smoking status: Never Smoker  . Smokeless tobacco: Never Used  Vaping Use  . Vaping Use: Never used  Substance Use Topics  . Alcohol use: No    Alcohol/week: 0.0 standard drinks  . Drug use: No    Home Medications Prior to Admission  medications   Medication Sig Start Date End Date Taking? Authorizing Provider  albuterol (PROVENTIL HFA;VENTOLIN HFA) 108 (90 BASE) MCG/ACT inhaler Inhale 2 puffs into the lungs every 6 (six) hours as needed for wheezing or shortness of breath.    [provider]  ALPRAZolam Duanne Moron) 0.25 MG tablet Take 1 tablet by mouth 2 (two) times daily as needed for anxiety. 04/30/19   [provider]  ciprofloxacin (CIPRO) 250 MG tablet Take 250 mg by mouth 2 (two) times daily. Starting  3.18.22 x 10 days. 01/07/21   [provider]  ciprofloxacin (CIPRO) 500 MG tablet Take 1 tablet (500 mg total) by mouth 2 (two) times daily. Patient not taking: No sig reported 11/28/20   Faustino Congress, NP  cloNIDine (CATAPRES) 0.1 MG tablet Take 1 tablet (0.1 mg total) by mouth 3 (three) times daily. 06/08/20   Arnoldo Lenis, MD  diltiazem (CARDIZEM CD) 360 MG 24 hr capsule Take 360 mg by mouth daily.    [provider]  esomeprazole (NEXIUM) 40 MG capsule Take 40 mg by mouth daily before breakfast.     [provider]  ezetimibe (ZETIA) 10 MG tablet Take 10 mg by mouth daily.    [provider]  Fluticasone-Salmeterol (ADVAIR) 250-50 MCG/DOSE AEPB Inhale 1 puff into the lungs every 12 (twelve) hours.    [provider]  furosemide (LASIX) 20 MG tablet Take 1 tablet (20 mg total) by mouth as needed for edema (swelling). 07/16/20   Arnoldo Lenis, MD  guanFACINE (INTUNIV) 1 MG TB24 ER tablet Take 1 mg by mouth daily. 04/01/20   [provider]  hydrALAZINE (APRESOLINE) 25 MG tablet Take 1.5 tablets (37.5 mg total) by mouth 3 (three) times daily. Patient taking differently: Take 100 mg by mouth 3 (three) times daily. 11/29/20 02/27/21  Arnoldo Lenis, MD  loteprednol (LOTEMAX) 0.2 % SUSP Place 1 drop into both eyes daily.     [provider]  meclizine (ANTIVERT) 25 MG tablet Take 1 tablet (25 mg total) by mouth 3 (three) times daily as needed for dizziness. 01/17/21   Jacqlyn Larsen, PA-C  metoprolol tartrate (LOPRESSOR) 25 MG tablet Take 0.5 tablets (12.5 mg total) by mouth 2 (two) times daily. 01/13/21   Arnoldo Lenis, MD  multivitamin-iron-minerals-folic acid (CENTRUM) chewable tablet Chew 1 tablet by mouth daily.    [provider]  Netarsudil Dimesylate 0.02 % SOLN Place 1 drop into both eyes every evening.    [provider]  olmesartan (BENICAR) 40 MG tablet Take 1 tablet by mouth daily. 06/18/19    [provider]    Allergies    Apple, Aspirin, Avelox [moxifloxacin hcl in nacl], Bee venom, Benadryl [diphenhydramine hcl], Chicken allergy, Codeine, Factive [gemifloxacin mesylate], Factive [gemifloxacin], Hctz [hydrochlorothiazide], Iodinated diagnostic agents, Iodine, Levaquin [levofloxacin in d5w], Levofloxacin, Penicillins, Sulfa antibiotics, and Tequin [gatifloxacin]  Review of Systems   Review of Systems  Unable to perform ROS: Mental status change    Physical Exam Updated Vital Signs There were no vitals taken for this visit.  Physical Exam Vitals and nursing note reviewed.  Constitutional:      General: She is not in acute distress.    Appearance: She is well-developed.  HENT:     Head: Normocephalic and atraumatic.     Mouth/Throat:     Mouth: Mucous membranes are moist.     Pharynx: No oropharyngeal exudate.  Eyes:     General: No scleral icterus.  Right eye: No discharge.        Left eye: No discharge.     Conjunctiva/sclera: Conjunctivae normal.     Pupils: Pupils are equal, round, and reactive to light.  Neck:     Thyroid: No thyromegaly.     Vascular: No JVD.  Cardiovascular:     Rate and Rhythm: Normal rate and regular rhythm.     Heart sounds: Normal heart sounds. No murmur heard. No friction rub. No gallop.   Pulmonary:     Effort: Pulmonary effort is normal. No respiratory distress.     Breath sounds: Wheezing present. No rales.     Comments: Slight expiratory wheeze Abdominal:     General: Bowel sounds are normal. There is no distension.     Palpations: Abdomen is soft. There is no mass.     Tenderness: There is no abdominal tenderness.  Musculoskeletal:        General: No swelling, tenderness, deformity or signs of injury. Normal range of motion.     Cervical back: Normal range of motion and neck supple.     Right lower leg: No edema.     Left lower leg: No edema.  Lymphadenopathy:     Cervical: No cervical adenopathy.  Skin:     General: Skin is warm and dry.     Coloration: Skin is not jaundiced.     Findings: No erythema or rash.  Neurological:     Mental Status: She is alert.     Coordination: Coordination normal.     Comments: The patient has equal bilateral grips, her lower extremities have normal strength and she is able to straight leg raise.  Cranial nerves III through XII are normal without any facial droop or slurred speech.  The patient is able to follow commands without difficulty, she can perform finger-nose-finger, she has normal sensation diffusely, there is a mild tremor of the bilateral upper extremities.  She is confused to the reason that she is here but is aware of her birthdate, the location, she know she lives by herself and knows that her daughter gave her a ride to the hospital  Psychiatric:        Behavior: Behavior normal.     ED Results / Procedures / Treatments   Labs (all labs ordered are listed, but only abnormal results are displayed) Labs Reviewed - No data to display  EKG None  Radiology No results found.  Procedures .Critical Care Performed by: Noemi Chapel, MD Authorized by: Noemi Chapel, MD   Critical care provider statement:    Critical care time (minutes):  35   Critical care time was exclusive of:  Separately billable procedures and treating other patients and teaching time   Critical care was necessary to treat or prevent imminent or life-threatening deterioration of the following conditions:  Endocrine crisis   Critical care was time spent personally by me on the following activities:  Blood draw for specimens, development of treatment plan with patient or surrogate, discussions with consultants, evaluation of patient's response to treatment, examination of patient, obtaining history from patient or surrogate, ordering and performing treatments and interventions, ordering and review of laboratory studies, ordering and review of radiographic studies, pulse oximetry,  re-evaluation of patient's condition and review of old charts     Medications Ordered in ED Medications - No data to display  ED Course  I have reviewed the triage vital signs and the nursing notes.  Pertinent labs & imaging results that were  available during my care of the patient were reviewed by me and considered in my medical decision making (see chart for details).    MDM Rules/Calculators/A&P                          The patient will need to have repeat lab work, vital signs, cardiac monitoring, EKG due to palpitations.  She does not appear to have a tacky arrhythmia at this time though her heart rate is borderline tachycardic.  The patient is agreeable to the plan  Patient has a severe hyponatremia measuring 117, she will need to be admitted to the hospital and is at significant risk for decompensation including altered mental status and even possibly seizures.  I discussed her care with the hospitalist who will admit the patient to the hospital.  Final Clinical Impression(s) / ED Diagnoses Final diagnoses:  None    Rx / DC Orders ED Discharge Orders    None       Noemi Chapel, MD 02/11/21 1622

## 2021-02-02 NOTE — ED Notes (Signed)
Pt oriented to person, place, doesn't know the day of the week, re oriented pt.

## 2021-02-03 DIAGNOSIS — E440 Moderate protein-calorie malnutrition: Secondary | ICD-10-CM | POA: Insufficient documentation

## 2021-02-03 DIAGNOSIS — E878 Other disorders of electrolyte and fluid balance, not elsewhere classified: Secondary | ICD-10-CM | POA: Diagnosis not present

## 2021-02-03 DIAGNOSIS — I169 Hypertensive crisis, unspecified: Secondary | ICD-10-CM

## 2021-02-03 DIAGNOSIS — G9341 Metabolic encephalopathy: Secondary | ICD-10-CM | POA: Diagnosis not present

## 2021-02-03 DIAGNOSIS — E871 Hypo-osmolality and hyponatremia: Secondary | ICD-10-CM | POA: Diagnosis not present

## 2021-02-03 LAB — CBC
HCT: 33.5 % — ABNORMAL LOW (ref 36.0–46.0)
Hemoglobin: 11.8 g/dL — ABNORMAL LOW (ref 12.0–15.0)
MCH: 32.1 pg (ref 26.0–34.0)
MCHC: 35.2 g/dL (ref 30.0–36.0)
MCV: 91 fL (ref 80.0–100.0)
Platelets: 223 10*3/uL (ref 150–400)
RBC: 3.68 MIL/uL — ABNORMAL LOW (ref 3.87–5.11)
RDW: 11.9 % (ref 11.5–15.5)
WBC: 3 10*3/uL — ABNORMAL LOW (ref 4.0–10.5)
nRBC: 0 % (ref 0.0–0.2)

## 2021-02-03 LAB — URINALYSIS, ROUTINE W REFLEX MICROSCOPIC
Bilirubin Urine: NEGATIVE
Glucose, UA: NEGATIVE mg/dL
Hgb urine dipstick: NEGATIVE
Ketones, ur: 20 mg/dL — AB
Leukocytes,Ua: NEGATIVE
Nitrite: NEGATIVE
Protein, ur: NEGATIVE mg/dL
Specific Gravity, Urine: 1.011 (ref 1.005–1.030)
pH: 6 (ref 5.0–8.0)

## 2021-02-03 LAB — CORTISOL: Cortisol, Plasma: 13.1 ug/dL

## 2021-02-03 LAB — RENAL FUNCTION PANEL
Albumin: 3.5 g/dL (ref 3.5–5.0)
Anion gap: 9 (ref 5–15)
BUN: 10 mg/dL (ref 8–23)
CO2: 23 mmol/L (ref 22–32)
Calcium: 8.7 mg/dL — ABNORMAL LOW (ref 8.9–10.3)
Chloride: 88 mmol/L — ABNORMAL LOW (ref 98–111)
Creatinine, Ser: 0.88 mg/dL (ref 0.44–1.00)
GFR, Estimated: >60 mL/min (ref >60)
Glucose, Bld: 158 mg/dL — ABNORMAL HIGH (ref 70–99)
Phosphorus: 2.3 mg/dL — ABNORMAL LOW (ref 2.5–4.6)
Potassium: 3.3 mmol/L — ABNORMAL LOW (ref 3.5–5.1)
Sodium: 120 mmol/L — ABNORMAL LOW (ref 135–145)

## 2021-02-03 LAB — OSMOLALITY: Osmolality: 257 mOsm/kg — ABNORMAL LOW (ref 275–295)

## 2021-02-03 LAB — BASIC METABOLIC PANEL
Anion gap: 8 (ref 5–15)
BUN: 11 mg/dL (ref 8–23)
CO2: 22 mmol/L (ref 22–32)
Calcium: 8.1 mg/dL — ABNORMAL LOW (ref 8.9–10.3)
Chloride: 88 mmol/L — ABNORMAL LOW (ref 98–111)
Creatinine, Ser: 0.82 mg/dL (ref 0.44–1.00)
GFR, Estimated: >60 mL/min (ref >60)
Glucose, Bld: 100 mg/dL — ABNORMAL HIGH (ref 70–99)
Potassium: 3.7 mmol/L (ref 3.5–5.1)
Sodium: 118 mmol/L — CL (ref 135–145)

## 2021-02-03 LAB — SODIUM
Sodium: 119 mmol/L — CL (ref 135–145)
Sodium: 119 mmol/L — CL (ref 135–145)
Sodium: 121 mmol/L — ABNORMAL LOW (ref 135–145)
Sodium: 120 mmol/L — ABNORMAL LOW (ref 135–145)

## 2021-02-03 LAB — SODIUM, URINE, RANDOM
Sodium, Ur: 85 mmol/L
Sodium, Ur: 90 mmol/L

## 2021-02-03 LAB — OSMOLALITY, URINE: Osmolality, Ur: 436 mOsm/kg (ref 300–900)

## 2021-02-03 LAB — MAGNESIUM: Magnesium: 1.6 mg/dL — ABNORMAL LOW (ref 1.7–2.4)

## 2021-02-03 LAB — CREATININE, URINE, RANDOM: Creatinine, Urine: 69.69 mg/dL

## 2021-02-03 LAB — SARS CORONAVIRUS 2 (TAT 6-24 HRS): SARS Coronavirus 2: NEGATIVE

## 2021-02-03 MED ORDER — MAGNESIUM SULFATE 4 GM/100ML IV SOLN
4.0000 g | Freq: Once | INTRAVENOUS | Status: AC
  Administered 2021-02-03: 4 g via INTRAVENOUS
  Filled 2021-02-03: qty 100

## 2021-02-03 MED ORDER — ENSURE ENLIVE PO LIQD
237.0000 mL | Freq: Three times a day (TID) | ORAL | Status: DC
  Administered 2021-02-04 – 2021-02-11 (×15): 237 mL via ORAL

## 2021-02-03 MED ORDER — DILTIAZEM HCL ER COATED BEADS 180 MG PO CP24
360.0000 mg | ORAL_CAPSULE | Freq: Every day | ORAL | Status: DC
  Administered 2021-02-03 – 2021-02-10 (×7): 360 mg via ORAL
  Filled 2021-02-03 (×9): qty 2

## 2021-02-03 MED ORDER — POTASSIUM CHLORIDE CRYS ER 20 MEQ PO TBCR
40.0000 meq | EXTENDED_RELEASE_TABLET | Freq: Once | ORAL | Status: AC
  Administered 2021-02-03: 40 meq via ORAL
  Filled 2021-02-03: qty 2

## 2021-02-03 MED ORDER — IRBESARTAN 150 MG PO TABS
300.0000 mg | ORAL_TABLET | Freq: Every day | ORAL | Status: DC
  Administered 2021-02-03 – 2021-02-09 (×7): 300 mg via ORAL
  Filled 2021-02-03 (×9): qty 2

## 2021-02-03 MED ORDER — ADULT MULTIVITAMIN W/MINERALS CH
1.0000 | ORAL_TABLET | Freq: Every day | ORAL | Status: DC
  Administered 2021-02-03 – 2021-02-11 (×9): 1 via ORAL
  Filled 2021-02-03 (×9): qty 1

## 2021-02-03 NOTE — Evaluation (Signed)
Physical Therapy Evaluation Patient Details Name: Stephanie West MRN: [id number] DOB: February 11, 1941 Today's Date: 02/03/2021   History of Present Illness  Stephanie West is an 80 y.o. female with a PMH significant for HTN, mitral valve prolapse, GERD, and asthma who was sent from her PCP's office due to low sodium level noticed on labs.  Repeat labs in ED showed Na of 117 and she was admitted for further treatment.  She apparently was very confused yesterday and her daughter had to give information.  She reports that the patient has not been eating or drinking much at all for the past 3 weeks.  She had been to the ED on 01/17/21 with dizziness and started on meclizine.  Her sodium was 128 at that time.  She has a history of low sodium levels as is seen by the trend below.     She is feeling better today and denies any N/V/D but just "can't eat, I'm not hungry".  No SOB, DOE, lower extremity edema.  She does not know what happened or why she is in the hospital.    Clinical Impression  Pt admitted with above diagnosis. Patient reports independent for all ADLs without assistive device prior to admission. Patient performed well with bed mobility, requiring assistance and/or RW for transfers and ambulation to steady her gait. Patient did complain of dizziness with bed mobility that did not clear nor worsen with additional time seated at bedside nor with transfers or ambulation. Patient performed well but is functioning below her baseline level. PT encourage her to drink fluids. Pt currently with functional limitations due to the deficits listed below (see PT Problem List). Pt will benefit from skilled PT to increase their independence and safety with mobility to allow discharge to the venue listed below.       Follow Up Recommendations Home health PT;Supervision - Intermittent;Supervision for mobility/OOB    Equipment Recommendations  3in1 (PT)    Recommendations for Other Services       Precautions  / Restrictions Precautions Precautions: Fall Restrictions Weight Bearing Restrictions: No      Mobility  Bed Mobility Overal bed mobility: Modified Independent  General bed mobility comments: complaints of dizziness sitting at edge of bed that did not clear nor worsen    Transfers Overall transfer level: Needs assistance Equipment used: 2 person hand held assist Transfers: Sit to/from Stand;Stand Pivot Transfers Sit to Stand: Min assist Stand pivot transfers: Min assist       General transfer comment: complaints of dizziness did not worsen with mobility, complaints of weakness/malaise, somewhat unsteady on feet; more steady with 2 hands held or RW  Ambulation/Gait Ambulation/Gait assistance: Min guard Gait Distance (Feet): 20 Feet Assistive device: Rolling walker (2 wheeled);1 person hand held assist;IV Pole Gait Pattern/deviations: Step-through pattern;Decreased step length - right;Decreased step length - left;Decreased stride length;Narrow base of support Gait velocity: decreased   General Gait Details: slow, labored gait using one hand held and IV pole; more steady with RW; limited by fatigue; on room air  Stairs   Wheelchair Mobility    Modified Rankin (Stroke Patients Only)       Balance Overall balance assessment: Needs assistance Sitting-balance support: Bilateral upper extremity supported;Feet supported Sitting balance-Leahy Scale: Good     Standing balance support: During functional activity;No upper extremity supported Standing balance-Leahy Scale: Poor Standing balance comment: fair with RW        Pertinent Vitals/Pain Pain Assessment: 0-10 Pain Score: 3  Pain Location: headache  Home Living Family/patient expects to be discharged to:: Private residence Living Arrangements: Alone Available Help at Discharge: Family;Available PRN/intermittently (daughter, Stephanie West) Type of Home: House Home Access: Stairs to enter Entrance Stairs-Rails:  None Entrance Stairs-Number of Steps: 1 - entry tep Home Layout: One level Home Equipment: Walker - 2 wheels;Shower seat;Grab bars - toilet;Grab bars - tub/shower;Hand held shower head      Prior Function Level of Independence: Independent         Comments: patient report totally independent until prior to admission including driving and grocery shopping; help to mow yard only     Hand Dominance        Extremity/Trunk Assessment   Upper Extremity Assessment Upper Extremity Assessment: Generalized weakness    Lower Extremity Assessment Lower Extremity Assessment: Generalized weakness    Cervical / Trunk Assessment Cervical / Trunk Assessment: Kyphotic  Communication   Communication: No difficulties  Cognition Arousal/Alertness: Awake/alert Behavior During Therapy: WFL for tasks assessed/performed Overall Cognitive Status: Within Functional Limits for tasks assessed       General Comments      Exercises     Assessment/Plan    PT Assessment Patient needs continued PT services  PT Problem List Decreased strength;Decreased activity tolerance;Decreased knowledge of use of DME;Decreased balance;Decreased mobility       PT Treatment Interventions DME instruction;Balance training;Gait training;Neuromuscular re-education;Patient/family education;Therapeutic activities;Therapeutic exercise    PT Goals (Current goals can be found in the Care Plan section)  Acute Rehab PT Goals Patient Stated Goal: Return to prior level of function. PT Goal Formulation: With patient Time For Goal Achievement: 02/17/21 Potential to Achieve Goals: Fair    Frequency Min 3X/week   Barriers to discharge           AM-PAC PT "6 Clicks" Mobility  Outcome Measure Help needed turning from your back to your side while in a flat bed without using bedrails?: None Help needed moving from lying on your back to sitting on the side of a flat bed without using bedrails?: A Little Help needed  moving to and from a bed to a chair (including a wheelchair)?: A Little Help needed standing up from a chair using your arms (e.g., wheelchair or bedside chair)?: A Little Help needed to walk in hospital room?: A Little Help needed climbing 3-5 steps with a railing? : A Little 6 Click Score: 19    End of Session   Activity Tolerance: Patient limited by fatigue Patient left: in bed;with call bell/phone within reach;with bed alarm set Nurse Communication: Mobility status PT Visit Diagnosis: Unsteadiness on feet (R26.81);Other abnormalities of gait and mobility (R26.89);Muscle weakness (generalized) (M62.81)    Time: 4270-6237 PT Time Calculation (min) (ACUTE ONLY): 30 min   Charges:   PT Evaluation $PT Eval Low Complexity: 1 Low PT Treatments $Therapeutic Activity: 8-22 mins        Stephanie Raveling. Hartnett-Rands, MS, PT Per [place] [phone]  Stephanie Hurry  West 02/03/2021, 10:34 AM

## 2021-02-03 NOTE — Progress Notes (Signed)
BP 214/85. Yellow MEWS. MD notified with order received to give patient dose of clonidine now. Will recheck vitals q.2h

## 2021-02-03 NOTE — Progress Notes (Signed)
Vitals rechecked and BP 162/76. MEWS score returned to green.

## 2021-02-03 NOTE — Progress Notes (Signed)
PROGRESS NOTE   Stephanie West  IWL:[id number] DOB: 1941-03-06 DOA: 02/02/2021 PCP: Celene Squibb, MD   Chief Complaint  Patient presents with  . Abnormal Lab   Level of care: Telemetry  Brief Admission History:  80 y.o. female, with history of mitral valve prolapse, hypertension, GERD, asthma, and more presents to the ED with a chief complaint of low sodium.  Patient had presented to her PCP for bilateral arm tremors, generalized weakness, fatigue blood work was done that showed a sodium of 117.  Patient usually runs a slightly low between 126 and 132, but this was low even for her.  Unfortunately patient is not able to give much history because she is confused.  Daughter, [name], is at bedside and reports that patient has not been eating for last 3 weeks mostly due to ongoing depression from loss of husband 2 years ago.    Assessment & Plan:   Principal Problem:   Acute hyponatremia Active Problems:   Hypertensive crisis   Hypochloremia   Anxiety   Acute metabolic encephalopathy   1. Severe hyponatremia - presumably from poor oral intake, continue IV fluid with normal saline infusion and frequent sodium level testing.  Appreciate nephrology consultation and recommendations.  2. Metabolic encephalopathy - mentation seems to be improving.   3. Hypertensive urgency - resumed all home BP meds and BP slowly better controlled.  4. Severe depression -since loss of husband, when medically cleared will ask for TTS evaluation. She may need inpatient behavioral health treatment.   DVT prophylaxis: SCD Code Status: full  Family Communication:  Disposition: TBD Status is: Inpatient  Remains inpatient appropriate because:Persistent severe electrolyte disturbances and Inpatient level of care appropriate due to severity of illness   Dispo: The patient is from: Home              Anticipated d/c is to: TBD              Patient currently is not medically stable to d/c.   Difficult to place  patient No  Consultants:   Nephrology   Procedures:   N/a   Antimicrobials:  N/a    Subjective: Pt reports that she is trying hard to eat more, she is drinking the nutritional beverages.  Objective: Vitals:   02/02/21 2312 02/03/21 0418 02/03/21 0547 02/03/21 0802  BP:  (!) 214/85 (!) 162/76   Pulse:  85 72   Resp:  20 17   Temp:  97.6 F (36.4 C) 97.9 F (36.6 C)   TempSrc:   Oral   SpO2:  96% 95% 94%  Weight: 50.9 kg     Height: 5\' 4"  (1.626 m)       Intake/Output Summary (Last 24 hours) at 02/03/2021 1342 Last data filed at 02/03/2021 0830 Gross per 24 hour  Intake 1381.22 ml  Output --  Net 1381.22 ml   Filed Weights   02/02/21 2312  Weight: 50.9 kg    Examination:  General exam: frail, elderly female, Appears calm and comfortable  Respiratory system: Clear to auscultation. Respiratory effort normal. Cardiovascular system: normal S1 & S2 heard. No JVD, murmurs, rubs, gallops or clicks. No pedal edema. Gastrointestinal system: Abdomen is nondistended, soft and nontender. No organomegaly or masses felt. Normal bowel sounds heard. Central nervous system: Alert and oriented. No focal neurological deficits. Extremities: Symmetric 5 x 5 power. Skin: No rashes, lesions or ulcers Psychiatry: Judgement and insight appear poor. Mood & affect depressed.   Data Reviewed: I have  personally reviewed following labs and imaging studies  CBC: Recent Labs  Lab 02/02/21 1757 02/03/21 0229  WBC 4.4 3.0*  NEUTROABS 2.2  --   HGB 13.8 11.8*  HCT 39.0 33.5*  MCV 90.5 91.0  PLT 287 578    Basic Metabolic Panel: Recent Labs  Lab 02/02/21 1757 02/03/21 0229 02/03/21 0953 02/03/21 1113  NA 117* 118* 120* 119*  K 4.1 3.7 3.3*  --   CL 84* 88* 88*  --   CO2 22 22 23   --   GLUCOSE 123* 100* 158*  --   BUN 14 11 10   --   CREATININE 0.98 0.82 0.88  --   CALCIUM 9.2 8.1* 8.7*  --   MG 1.9 1.6*  --   --   PHOS 3.7  --  2.3*  --     GFR: Estimated Creatinine  Clearance: 39.6 mL/min (by C-G formula based on SCr of 0.88 mg/dL).  Liver Function Tests: Recent Labs  Lab 02/02/21 1757 02/03/21 0953  AST 22  --   ALT 14  --   ALKPHOS 65  --   BILITOT 0.7  --   PROT 7.5  --   ALBUMIN 4.2 3.5    CBG: No results for input(s): GLUCAP in the last 168 hours.  Recent Results (from the past 240 hour(s))  SARS CORONAVIRUS 2 (TAT 6-24 HRS) Nasopharyngeal Nasopharyngeal Swab     Status: None   Collection Time: 02/02/21  9:14 PM   Specimen: Nasopharyngeal Swab  Result Value Ref Range Status   SARS Coronavirus 2 NEGATIVE NEGATIVE Final    Comment: (NOTE) SARS-CoV-2 target nucleic acids are NOT DETECTED.  The SARS-CoV-2 RNA is generally detectable in upper and lower respiratory specimens during the acute phase of infection. Negative results do not preclude SARS-CoV-2 infection, do not rule out co-infections with other pathogens, and should not be used as the sole basis for treatment or other patient management decisions. Negative results must be combined with clinical observations, patient history, and epidemiological information. The expected result is Negative.  Fact Sheet for Patients: SugarRoll.be  Fact Sheet for Healthcare Providers: https://www.woods-mathews.com/  This test is not yet approved or cleared by the [place] FDA and  has been authorized for detection and/or diagnosis of SARS-CoV-2 by FDA under an Emergency Use Authorization (EUA). This EUA will remain  in effect (meaning this test can be used) for the duration of the COVID-19 declaration under Se ction 564(b)(1) of the Act, 21 U.S.C. section 360bbb-3(b)(1), unless the authorization is terminated or revoked sooner.  Performed at [facility], [place] [address] [place],  [address]      Radiology Studies: DG Chest Port 1 View  Result Date: 02/02/2021 CLINICAL DATA:  Shortness of breath. EXAM: PORTABLE CHEST 1 VIEW  COMPARISON:  January 17, 2021. FINDINGS: The heart size and mediastinal contours are within normal limits. Stable right basilar scarring is noted. No acute pulmonary disease is noted. No pneumothorax or pleural effusion is noted. The visualized skeletal structures are unremarkable. IMPRESSION: No acute cardiopulmonary abnormality seen. Aortic Atherosclerosis (ICD10-I70.0). Electronically Signed   By: Marijo Conception M.D.   On: 02/02/2021 18:01    Scheduled Meds: . cloNIDine  0.1 mg Oral TID  . diltiazem  360 mg Oral QHS  . ezetimibe  10 mg Oral Daily  . feeding supplement  237 mL Oral BID BM  . guanFACINE  1 mg Oral Daily  . heparin  5,000 Units Subcutaneous Q8H  .  hydrALAZINE  100 mg Oral TID  . irbesartan  300 mg Oral Daily  . loteprednol  1 drop Both Eyes Daily  . metoprolol tartrate  12.5 mg Oral BID  . mometasone-formoterol  2 puff Inhalation BID  . Netarsudil Dimesylate  1 drop Both Eyes QPM  . pantoprazole  40 mg Oral Daily   Continuous Infusions: . sodium chloride 100 mL/hr at 02/03/21 0346     LOS: 1 day   Time spent: 80 mins   Emonee Winkowski Wynetta Emery, MD How to contact the [facility] Attending or Consulting provider [place] or covering provider during after hours [place], for this patient?  1. Check the care team in [facility] and look for a) attending/consulting TR[facility] provider listed and b) the [facility] team listed 2. Log into [email or web address] and use [place]'s universal password to access. If you do not have the password, please contact the hospital operator. 3. Locate the [facility] provider you are looking for under [facility] and page to a number that you can be directly reached. 4. If you still have difficulty reaching the provider, please page the [facility] (Director on Call) for the Hospitalists listed on amion for assistance.  02/03/2021, 1:42 PM

## 2021-02-03 NOTE — Progress Notes (Signed)
Date and time results received: 02/03/21 12;04PM  (use smartphrase ".now" to insert current time)  Test: sodium  Critical Value: 119 sodium level   Name of Provider Notified: Dr. Wynetta Emery  Orders Received? Or Actions Taken : No new orders patient sodium level was 118 , however did notify provider

## 2021-02-03 NOTE — Progress Notes (Signed)
Date and time results received: 02/03/21  (use smartphrase ".now" to insert current time) 0320  Test: sodium Critical Value: 118  Name of Provider Notified: Dr. Clearence Ped  Orders Received? Or Actions Taken?: No new ordered received at this time .

## 2021-02-03 NOTE — Consult Note (Signed)
Reason for Consult: Hyponatremia Referring Physician: Wynetta Emery, MD  Stephanie West is an 80 y.o. female with a PMH significant for HTN, mitral valve prolapse, GERD, and asthma who was sent from her PCP's office due to low sodium level noticed on labs.  Repeat labs in ED showed Na of 117 and she was admitted for further treatment.  She apparently was very confused yesterday and her daughter had to give information.  She reports that the patient has not been eating or drinking much at all for the past 3 weeks.  She had been to the ED on 01/17/21 with dizziness and started on meclizine.  Her sodium was 128 at that time.  She has a history of low sodium levels as is seen by the trend below.  She is feeling better today and denies any N/V/D but just "can't eat, I'm not hungry".  No SOB, DOE, lower extremity edema.  She does not know what happened or why she is in the hospital.   Trend in Serum Sodium: Sodium  Date/Time Value Ref Range Status  02/03/2021 02:29 AM 118 (LL) 135 - 145 mmol/L Final  02/02/2021 05:57 PM 117 (LL) 135 - 145 mmol/L Final  01/17/2021 04:54 PM 128 (L) 135 - 145 mmol/L Final  06/30/2020 09:25 AM 132 (L) 135 - 146 mmol/L Final  06/11/2020 01:24 PM 126 (L) 135 - 146 mmol/L Final  07/07/2019 05:48 AM 127 (L) 135 - 145 mmol/L Final  07/06/2019 06:05 AM 128 (L) 135 - 145 mmol/L Final  07/05/2019 07:21 AM 131 (L) 135 - 145 mmol/L Final  07/04/2019 04:30 AM 128 (L) 135 - 145 mmol/L Final  07/03/2019 04:50 AM 120 (L) 135 - 145 mmol/L Final  07/02/2019 02:39 PM 118 (LL) 135 - 145 mmol/L Final  07/02/2019 08:00 AM 117 (LL) 135 - 145 mmol/L Final  02/17/2016 01:59 PM 134 (L) 135 - 146 mmol/L Final  02/12/2016 06:16 AM 135 135 - 145 mmol/L Final  02/11/2016 06:11 AM 138 135 - 145 mmol/L Final  02/10/2016 06:42 AM 136 135 - 145 mmol/L Final  02/09/2016 06:02 AM 132 (L) 135 - 145 mmol/L Final  02/08/2016 04:16 AM 132 (L) 135 - 145 mmol/L Final  02/07/2016 05:10 PM 128 (L) 135 - 145  mmol/L Final  07/03/2015 07:39 PM 135 135 - 145 mmol/L Final  03/18/2011 06:51 AM 138 135 - 145 mEq/L Final  03/15/2011 05:33 AM 136 135 - 145 mEq/L Final  03/14/2011 05:22 PM 131 (L) 135 - 145 mEq/L Final    PMH:   Past Medical History:  Diagnosis Date  . Asthma   . Dysphagia 05/10/2017  . Elevated transaminase level   . GERD (gastroesophageal reflux disease)   . Hypertension   . MVP (mitral valve prolapse)     PSH:   Past Surgical History:  Procedure Laterality Date  . COLONOSCOPY    . ESOPHAGEAL DILATION  07/30/2015   Procedure: ESOPHAGEAL DILATION;  Surgeon: Rogene Houston, MD;  [facility];  Service: Endoscopy;;  . ESOPHAGOGASTRODUODENOSCOPY N/A 04/15/2015   Procedure: ESOPHAGOGASTRODUODENOSCOPY (EGD);  Surgeon: Rogene Houston, MD;  Location: [facility];  Service: Endoscopy;  Laterality: N/A;  125 - moved to 1:00, Ann to notify pt  . ESOPHAGOGASTRODUODENOSCOPY N/A 07/30/2015   Procedure: ESOPHAGOGASTRODUODENOSCOPY (EGD);  Surgeon: Rogene Houston, MD;  Location: [facility];  Service: Endoscopy;  Laterality: N/A;  1040  . ESOPHAGOGASTRODUODENOSCOPY (EGD) WITH ESOPHAGEAL DILATION N/A 03/07/2013   Procedure: ESOPHAGOGASTRODUODENOSCOPY (EGD) WITH  ESOPHAGEAL DILATION;  Surgeon: Rogene Houston, MD;  Location: [facility];  Service: Endoscopy;  Laterality: N/A;  830  . EYE SURGERY    . LAPAROSCOPIC TUBAL LIGATION  1984  . UPPER GASTROINTESTINAL ENDOSCOPY      Allergies:  Allergies  Allergen Reactions  . Apple Other (See Comments)    Wheezing and Asthma Symptoms   . Aspirin Other (See Comments)    Wheezing, Asthma Symptoms   . Avelox [Moxifloxacin Hcl In Nacl] Hives    Pt can take cipro  . Bee Venom Other (See Comments)    Wheezing, Asthma Symptoms  . Benadryl [Diphenhydramine Hcl] Other (See Comments)    Wheezing, Asthma Symptoms   . Chicken Allergy Other (See Comments)    Wheezing and Asthma Symptoms   . Codeine Other (See Comments)    Makes patient  feel like she's going to pass out.   Haze Boyden [Gemifloxacin Mesylate] Hives  . Factive [Gemifloxacin] Hives  . Hctz [Hydrochlorothiazide]     hyponatremia  . Iodinated Diagnostic Agents Other (See Comments)    Pt was told by allergist to avoid  Dye due to other allergies  . Iodine   . Levaquin [Levofloxacin In D5w] Hives    Pt can take cipro  . Levofloxacin Hives  . Penicillins Other (See Comments)    Asthma Symptoms .Did it involve swelling of the face/tongue/throat, SOB, or low BP? No Did it involve sudden or severe rash/hives, skin peeling, or any reaction on the inside of your mouth or nose? Yes Did you need to seek medical attention at a hospital or doctor's office? Unknown When did it last happen?Unkown If all above answers are "NO", may proceed with cephalosporin use.   . Sulfa Antibiotics Nausea Only  . Tequin [Gatifloxacin] Hives    Medications:   Prior to Admission medications   Medication Sig Start Date End Date Taking? Authorizing Provider  albuterol (PROVENTIL HFA;VENTOLIN HFA) 108 (90 BASE) MCG/ACT inhaler Inhale 2 puffs into the lungs every 6 (six) hours as needed for wheezing or shortness of breath.   Yes [provider]  ALPRAZolam (XANAX) 0.25 MG tablet Take 1 tablet by mouth 2 (two) times daily as needed for anxiety. 04/30/19  Yes [provider]  cloNIDine (CATAPRES) 0.1 MG tablet Take 1 tablet (0.1 mg total) by mouth 3 (three) times daily. 06/08/20  Yes BranchAlphonse Guild, MD  diltiazem (CARDIZEM CD) 360 MG 24 hr capsule Take 360 mg by mouth daily.   Yes [provider]  esomeprazole (NEXIUM) 40 MG capsule Take 40 mg by mouth daily before breakfast.    Yes [provider]  ezetimibe (ZETIA) 10 MG tablet Take 10 mg by mouth daily.   Yes [provider]  Fluticasone-Salmeterol (ADVAIR) 250-50 MCG/DOSE AEPB Inhale 1 puff into the lungs every 12 (twelve) hours.   Yes [provider]  guanFACINE (INTUNIV) 1 MG  TB24 ER tablet Take 1 mg by mouth daily. 04/01/20  Yes [provider]  hydrALAZINE (APRESOLINE) 25 MG tablet Take 1.5 tablets (37.5 mg total) by mouth 3 (three) times daily. Patient taking differently: Take 100 mg by mouth 3 (three) times daily. 11/29/20 02/27/21 Yes Branch, Alphonse Guild, MD  loteprednol (LOTEMAX) 0.2 % SUSP Place 1 drop into both eyes daily.    Yes [provider]  meclizine (ANTIVERT) 25 MG tablet Take 1 tablet (25 mg total) by mouth 3 (three) times daily as needed for dizziness. Patient taking differently: Take 25 mg  by mouth daily as needed for dizziness. 01/17/21  Yes Jacqlyn Larsen, PA-C  Netarsudil Dimesylate 0.02 % SOLN Place 1 drop into both eyes every evening.   Yes [provider]  olmesartan (BENICAR) 40 MG tablet Take 1 tablet by mouth daily. 06/18/19  Yes [provider]  potassium chloride (KLOR-CON) 10 MEQ tablet Take 10 mEq by mouth 3 (three) times daily. 01/14/21  Yes [provider]  ciprofloxacin (CIPRO) 250 MG tablet Take 250 mg by mouth 2 (two) times daily. Starting 3.18.22 x 10 days. Patient not taking: No sig reported 01/07/21   [provider]  ciprofloxacin (CIPRO) 500 MG tablet Take 1 tablet (500 mg total) by mouth 2 (two) times daily. Patient not taking: No sig reported 11/28/20   Faustino Congress, NP  fluocinonide cream (LIDEX) 0.05 %  01/14/21   [provider]  furosemide (LASIX) 20 MG tablet Take 1 tablet (20 mg total) by mouth as needed for edema (swelling). Patient not taking: Reported on 02/02/2021 07/16/20   Arnoldo Lenis, MD  metoprolol tartrate (LOPRESSOR) 25 MG tablet Take 0.5 tablets (12.5 mg total) by mouth 2 (two) times daily. Patient not taking: Reported on 02/02/2021 01/13/21   Arnoldo Lenis, MD  multivitamin-iron-minerals-folic acid (CENTRUM) chewable tablet Chew 1 tablet by mouth daily. Patient not taking: Reported on 02/02/2021    [provider]    Discontinued  Meds:   Medications Discontinued During This Encounter  Medication Reason  . diltiazem (CARDIZEM CD) 24 hr capsule 360 mg     Social History:  reports that she has never smoked. She has never used smokeless tobacco. She reports that she does not drink alcohol and does not use drugs.  Family History:   Family History  Problem Relation Age of Onset  . Hypertension Mother   . Prostate cancer Father     Pertinent items are noted in HPI.  Blood pressure (!) 162/76, pulse 72, temperature 97.9 F (36.6 C), temperature source Oral, resp. rate 17, height 5\' 4"  (1.626 m), weight 50.9 kg, SpO2 94 %. General appearance: alert, cooperative and no distress Head: Normocephalic, without obvious abnormality, atraumatic Eyes: negative findings: lids and lashes normal, conjunctivae and sclerae normal and corneas clear Resp: clear to auscultation bilaterally Cardio: regular rate and rhythm, S1, S2 normal, no murmur, click, rub or gallop GI: soft, non-tender; bowel sounds normal; no masses,  no organomegaly Extremities: extremities normal, atraumatic, no cyanosis or edema  Labs: Basic Metabolic Panel: Recent Labs  Lab 02/02/21 1757 02/03/21 0229  NA 117* 118*  K 4.1 3.7  CL 84* 88*  CO2 22 22  GLUCOSE 123* 100*  BUN 14 11  CREATININE 0.98 0.82  ALBUMIN 4.2  --   CALCIUM 9.2 8.1*  PHOS 3.7  --    Liver Function Tests: Recent Labs  Lab 02/02/21 1757  AST 22  ALT 14  ALKPHOS 65  BILITOT 0.7  PROT 7.5  ALBUMIN 4.2   No results for input(s): LIPASE, AMYLASE in the last 168 hours. No results for input(s): AMMONIA in the last 168 hours. CBC: Recent Labs  Lab 02/02/21 1757 02/03/21 0229  WBC 4.4 3.0*  NEUTROABS 2.2  --   HGB 13.8 11.8*  HCT 39.0 33.5*  MCV 90.5 91.0  PLT 287 223   PT/INR: @labrcntip (inr:5) Cardiac Enzymes: No results for input(s): CKTOTAL, CKMB, CKMBINDEX, TROPONINI in the last 168 hours. CBG: No results for input(s): GLUCAP in the last 168 hours.  Iron  Studies:  No results for input(s): IRON, TIBC, TRANSFERRIN, FERRITIN in the last 168 hours.  Xrays/Other Studies: DG Chest Port 1 View  Result Date: 02/02/2021 CLINICAL DATA:  Shortness of breath. EXAM: PORTABLE CHEST 1 VIEW COMPARISON:  January 17, 2021. FINDINGS: The heart size and mediastinal contours are within normal limits. Stable right basilar scarring is noted. No acute pulmonary disease is noted. No pneumothorax or pleural effusion is noted. The visualized skeletal structures are unremarkable. IMPRESSION: No acute cardiopulmonary abnormality seen. Aortic Atherosclerosis (ICD10-I70.0). Electronically Signed   By: Marijo Conception M.D.   On: 02/02/2021 18:01     Assessment/Plan: 1.  Hyponatremia - presumably due to volume depletion given poor po intake for the past 3 weeks.  Unfortunately the urine sodium was obtained while she was receiving IV NS and not at initial presentation.  Agree with initial assessment as the etiology and treatment plan.  Unfortunately there is not sodium level to review since 2:30 am.  Will order renal panel now along with cortisol and repeat urine studies.  Hopefully she will respond to IV NS alone.  Mentally she appears to have improved from yesterday. 1. Given the severity of her hyponatremia she should have sodium levels followed more frequently as she may need to transfer to ICU for 3% saline if her sodium continues to drop. 2. HTN - not sure of her home regimen.  Currently on clonidine 0.1 mg tid, dilt CD 360 mg daily and hydralazine 100 mg tid, metoprolol 12.5 mg bid, and avapro 300 mg daily.  Was hypertensive on admission with improved BP today.    Governor Rooks Brylan Seubert 02/03/2021, 9:35 AM

## 2021-02-03 NOTE — Progress Notes (Signed)
Date and time results received: 02/03/21 1422   Test: Na Critical Value: 119  Name of Provider Notified: Wynetta Emery  Orders Received? Or Actions Taken?: EKG ordered and performed

## 2021-02-03 NOTE — Progress Notes (Addendum)
Tele called to notify that pt's PR interval is increasing.  Provider notified.  Provider ordered EKG.  EKG performed.

## 2021-02-03 NOTE — Plan of Care (Signed)
  Problem: Acute Rehab PT Goals(only PT should resolve) Goal: Pt Will Go Supine/Side To Sit Outcome: Progressing Flowsheets (Taken 02/03/2021 1049) Pt will go Supine/Side to Sit: with supervision Goal: Pt Will Go Sit To Supine/Side Outcome: Progressing Flowsheets (Taken 02/03/2021 1049) Pt will go Sit to Supine/Side: with supervision Goal: Patient Will Transfer Sit To/From Stand Outcome: Progressing Flowsheets (Taken 02/03/2021 1049) Patient will transfer sit to/from stand: with supervision Goal: Pt Will Transfer Bed To Chair/Chair To Bed Outcome: Progressing Flowsheets (Taken 02/03/2021 1049) Pt will Transfer Bed to Chair/Chair to Bed: with supervision Goal: Pt Will Ambulate Outcome: Progressing Flowsheets (Taken 02/03/2021 1049) Pt will Ambulate:  50 feet  with least restrictive assistive device  with supervision Goal: Pt/caregiver will Perform Home Exercise Program Outcome: Progressing Flowsheets (Taken 02/03/2021 1049) Pt/caregiver will Perform Home Exercise Program:  For improved balance  For increased strengthening  With Supervision, verbal cues required/provided    Floria Raveling. Hartnett-Rands, MS, PT Per [place] [phone] 02/03/2021

## 2021-02-03 NOTE — Progress Notes (Signed)
Initial Nutrition Assessment  DOCUMENTATION CODES:   Non-severe (moderate) malnutrition in context of chronic illness  INTERVENTION:   -Ensure Enlive po TID, each supplement provides 350 kcal and 20 grams of protein -MVI with minerals daily -Magic cup BID with meals, each supplement provides 290 kcal and 9 grams of protein  NUTRITION DIAGNOSIS:   Moderate Malnutrition related to chronic illness (mitral valve prolapse) as evidenced by energy intake < or equal to 75% for > or equal to 1 month,mild fat depletion,mild muscle depletion,moderate muscle depletion.  GOAL:   Patient will meet greater than or equal to 90% of their needs  MONITOR:   PO intake,Supplement acceptance,Labs,Weight trends,Skin,I & O's  REASON FOR ASSESSMENT:   Malnutrition Screening Tool    ASSESSMENT:   Stephanie West  is a 80 y.o. female, with history of mitral valve prolapse, hypertension, GERD, asthma, and more presents to the ED with a chief complaint of low sodium.  Patient had presented to her PCP for bilateral arm tremors, generalized weakness, fatigue blood work was done that showed a sodium of 117  Pt admitted with acute hyponatremia/ hypochloremia and generalized weakness.   Reviewed I/O's: +901 ml x 24 hours  Spoke with pt at bedside, who reports a decreased appetite over the past month. Pt shares she rarely eats large meals (about once a week she will order a burger and fries from a local restaurant). Pt shares that she primarily snacks on fruit and potato chips throughout the day and consumes a can of pork and bans for dinner. She also consumes 1-2 Boost supplements daily.   Noted lunch tray is untouched. Pt reports she did not eat breakfast either (this was also confirmed by RN). RN and pt report she did consume a vanilla Ensure earlier today. Discussed importance of good meal and supplement intake to promote healing. Pt amenable to continue Ensure supplements.   Per pt, UBW is around 116#. She  acknowledges wt loss, but unsure how much, as her scale is broken at home. Reviewed wt hx; pt has experienced a 1.5% wt loss over the past 3 months. She admits being very active at home, doing all of her housework alone and also helps her neighbors.   Medications reviewed and include cardizem and 0.9% sodium chloride infusion @ 100 ml/hr.   Labs reviewed: Na: 118 (on IV supplementation). K WDL. Mg: 1.6.  NUTRITION - FOCUSED PHYSICAL EXAM:  Flowsheet Row Most Recent Value  Orbital Region Mild depletion  Upper Arm Region Mild depletion  Thoracic and Lumbar Region No depletion  Buccal Region No depletion  Temple Region Mild depletion  Clavicle Bone Region No depletion  Clavicle and Acromion Bone Region No depletion  Scapular Bone Region No depletion  Dorsal Hand Mild depletion  Patellar Region Moderate depletion  Anterior Thigh Region Moderate depletion  Posterior Calf Region Moderate depletion  Edema (RD Assessment) Mild  Hair Reviewed  Eyes Reviewed  Mouth Reviewed  Skin Reviewed  Nails Reviewed       Diet Order:   Diet Order            Diet regular Room service appropriate? Yes; Fluid consistency: Thin  Diet effective now                 EDUCATION NEEDS:   Education needs have been addressed  Skin:  Skin Assessment: Reviewed RN Assessment  Last BM:  01/28/21  Height:   Ht Readings from Last 1 Encounters:  02/02/21 5\' 4"  (1.626 m)  Weight:   Wt Readings from Last 1 Encounters:  02/02/21 50.9 kg    Ideal Body Weight:  54.5 kg  BMI:  Body mass index is 19.26 kg/m.  Estimated Nutritional Needs:   Kcal:  1500-1700  Protein:  75-90 grams  Fluid:  > 1.5 L    Loistine Chance, RD, LDN, [place] Registered Dietitian II Certified Diabetes Care and Education Specialist Please refer to [facility] for RD and/or RD on-call/weekend/after hours pager

## 2021-02-04 ENCOUNTER — Inpatient Hospital Stay (HOSPITAL_COMMUNITY): Payer: Medicare Other

## 2021-02-04 DIAGNOSIS — E871 Hypo-osmolality and hyponatremia: Secondary | ICD-10-CM | POA: Diagnosis not present

## 2021-02-04 DIAGNOSIS — G9341 Metabolic encephalopathy: Secondary | ICD-10-CM | POA: Diagnosis not present

## 2021-02-04 DIAGNOSIS — E878 Other disorders of electrolyte and fluid balance, not elsewhere classified: Secondary | ICD-10-CM | POA: Diagnosis not present

## 2021-02-04 DIAGNOSIS — R627 Adult failure to thrive: Secondary | ICD-10-CM | POA: Diagnosis not present

## 2021-02-04 DIAGNOSIS — I169 Hypertensive crisis, unspecified: Secondary | ICD-10-CM | POA: Diagnosis not present

## 2021-02-04 DIAGNOSIS — F5089 Other specified eating disorder: Secondary | ICD-10-CM | POA: Diagnosis not present

## 2021-02-04 LAB — SODIUM
Sodium: 122 mmol/L — ABNORMAL LOW (ref 135–145)
Sodium: 123 mmol/L — ABNORMAL LOW (ref 135–145)
Sodium: 124 mmol/L — ABNORMAL LOW (ref 135–145)
Sodium: 122 mmol/L — ABNORMAL LOW (ref 135–145)

## 2021-02-04 LAB — BASIC METABOLIC PANEL
Anion gap: 7 (ref 5–15)
BUN: 11 mg/dL (ref 8–23)
CO2: 22 mmol/L (ref 22–32)
Calcium: 8 mg/dL — ABNORMAL LOW (ref 8.9–10.3)
Chloride: 95 mmol/L — ABNORMAL LOW (ref 98–111)
Creatinine, Ser: 0.91 mg/dL (ref 0.44–1.00)
GFR, Estimated: >60 mL/min (ref >60)
Glucose, Bld: 99 mg/dL (ref 70–99)
Potassium: 4.4 mmol/L (ref 3.5–5.1)
Sodium: 124 mmol/L — ABNORMAL LOW (ref 135–145)

## 2021-02-04 LAB — MAGNESIUM: Magnesium: 2.3 mg/dL (ref 1.7–2.4)

## 2021-02-04 MED ORDER — MIRTAZAPINE 15 MG PO TABS
7.5000 mg | ORAL_TABLET | Freq: Every day | ORAL | Status: DC
  Administered 2021-02-04 – 2021-02-08 (×5): 7.5 mg via ORAL
  Filled 2021-02-04 (×5): qty 1

## 2021-02-04 NOTE — Progress Notes (Signed)
PROGRESS NOTE   Stephanie West  SPQ:[id number] DOB: May 29, 1941 DOA: 02/02/2021 PCP: Celene Squibb, MD   Chief Complaint  Patient presents with  . Abnormal Lab   Level of care: Telemetry  Brief Admission History:  80 y.o. female, with history of mitral valve prolapse, hypertension, GERD, asthma, and more presents to the ED with a chief complaint of low sodium.  Patient had presented to her PCP for bilateral arm tremors, generalized weakness, fatigue blood work was done that showed a sodium of 117.  Patient usually runs a slightly low between 126 and 132, but this was low even for her.  Unfortunately patient is not able to give much history because she is confused.  Daughter, Stephanie West, is at bedside and reports that patient has not been eating for last 3 weeks mostly due to ongoing depression from loss of husband 2 years ago.    Assessment & Plan:   Principal Problem:   Acute hyponatremia Active Problems:   Hypertensive crisis   Hypochloremia   Anxiety   Acute metabolic encephalopathy   Malnutrition of moderate degree   1. Severe hyponatremia - slowly improving, presumably from poor oral intake, continue IV fluid with normal saline infusion.  Appreciate nephrology consultation and recommendations.  2. Metabolic encephalopathy - mentation seems to be improving.   3. Hypertensive urgency - resumed all home BP meds and BP slowly better controlled.  4. Severe depression -since loss of husband, when medically cleared will ask for TTS evaluation. She may need inpatient behavioral health treatment.  5. Anorexia - spoke with GI, trial of remeron QHS.  CT chest abd pelvis to evaluate for underlying malignancy.   DVT prophylaxis: SCD  Code Status: full  Family Communication: no family present Disposition: TBD Status is: Inpatient  Remains inpatient appropriate because:Persistent severe electrolyte disturbances and Inpatient level of care appropriate due to severity of illness   Dispo: The  patient is from: Home              Anticipated d/c is to: TBD              Patient currently is not medically stable to d/c.   Difficult to place patient No  Consultants:   Nephrology   Procedures:   N/a   Antimicrobials:  N/a    Subjective: Pt reports starting to feel better and eating and drinking more.  Objective: Vitals:   02/04/21 0814 02/04/21 0817 02/04/21 1206 02/04/21 1413  BP:  (!) 187/59  (!) 177/74  Pulse:  73  83  Resp:    18  Temp:    97.9 F (36.6 C)  TempSrc:    Oral  SpO2: 95%   97%  Weight:   51.3 kg   Height:        Intake/Output Summary (Last 24 hours) at 02/04/2021 1642 Last data filed at 02/04/2021 1300 Gross per 24 hour  Intake 720 ml  Output 1150 ml  Net -430 ml   Filed Weights   02/02/21 2312 02/04/21 0545 02/04/21 1206  Weight: 50.9 kg 53.9 kg 51.3 kg    Examination:  General exam: frail, elderly female, Appears calm and comfortable  Respiratory system: Clear to auscultation. Respiratory effort normal. Cardiovascular system: normal S1 & S2 heard. No JVD, murmurs, rubs, gallops or clicks. No pedal edema. Gastrointestinal system: Abdomen is nondistended, soft and nontender. No organomegaly or masses felt. Normal bowel sounds heard. Central nervous system: Alert and oriented. No focal neurological deficits. Extremities: Symmetric 5  x 5 power. Skin: No rashes, lesions or ulcers Psychiatry: Judgement and insight appear poor. Mood & affect depressed.   Data Reviewed: I have personally reviewed following labs and imaging studies  CBC: Recent Labs  Lab 02/02/21 1757 02/03/21 0229  WBC 4.4 3.0*  NEUTROABS 2.2  --   HGB 13.8 11.8*  HCT 39.0 33.5*  MCV 90.5 91.0  PLT 287 578    Basic Metabolic Panel: Recent Labs  Lab 02/02/21 1757 02/03/21 0229 02/03/21 0953 02/03/21 1113 02/03/21 2033 02/04/21 0129 02/04/21 0403 02/04/21 0935 02/04/21 1606  NA 117* 118* 120*   < > 120* 122* 124*  123* 124* 122*  K 4.1 3.7 3.3*  --   --    --  4.4  --   --   CL 84* 88* 88*  --   --   --  95*  --   --   CO2 22 22 23   --   --   --  22  --   --   GLUCOSE 123* 100* 158*  --   --   --  99  --   --   BUN 14 11 10   --   --   --  11  --   --   CREATININE 0.98 0.82 0.88  --   --   --  0.91  --   --   CALCIUM 9.2 8.1* 8.7*  --   --   --  8.0*  --   --   MG 1.9 1.6*  --   --   --   --  2.3  --   --   PHOS 3.7  --  2.3*  --   --   --   --   --   --    < > = values in this interval not displayed.    GFR: Estimated Creatinine Clearance: 38.6 mL/min (by C-G formula based on SCr of 0.91 mg/dL).  Liver Function Tests: Recent Labs  Lab 02/02/21 1757 02/03/21 0953  AST 22  --   ALT 14  --   ALKPHOS 65  --   BILITOT 0.7  --   PROT 7.5  --   ALBUMIN 4.2 3.5    CBG: No results for input(s): GLUCAP in the last 168 hours.  Recent Results (from the past 240 hour(s))  SARS CORONAVIRUS 2 (TAT 6-24 HRS) Nasopharyngeal Nasopharyngeal Swab     Status: None   Collection Time: 02/02/21  9:14 PM   Specimen: Nasopharyngeal Swab  Result Value Ref Range Status   SARS Coronavirus 2 NEGATIVE NEGATIVE Final    Comment: (NOTE) SARS-CoV-2 target nucleic acids are NOT DETECTED.  The SARS-CoV-2 RNA is generally detectable in upper and lower respiratory specimens during the acute phase of infection. Negative results do not preclude SARS-CoV-2 infection, do not rule out co-infections with other pathogens, and should not be used as the sole basis for treatment or other patient management decisions. Negative results must be combined with clinical observations, patient history, and epidemiological information. The expected result is Negative.  Fact Sheet for Patients: SugarRoll.be  Fact Sheet for Healthcare Providers: https://www.woods-mathews.com/  This test is not yet approved or cleared by the [place] FDA and  has been authorized for detection and/or diagnosis of SARS-CoV-2 by FDA under an  Emergency Use Authorization (EUA). This EUA will remain  in effect (meaning this test can be used) for the duration of the COVID-19 declaration under Se ction 564(b)(1) of  the Act, 21 U.S.C. section 360bbb-3(b)(1), unless the authorization is terminated or revoked sooner.  Performed at [facility], [place] [address] [place], [place] [address]      Radiology Studies: DG Chest Port 1 View  Result Date: 02/02/2021 CLINICAL DATA:  Shortness of breath. EXAM: PORTABLE CHEST 1 VIEW COMPARISON:  January 17, 2021. FINDINGS: The heart size and mediastinal contours are within normal limits. Stable right basilar scarring is noted. No acute pulmonary disease is noted. No pneumothorax or pleural effusion is noted. The visualized skeletal structures are unremarkable. IMPRESSION: No acute cardiopulmonary abnormality seen. Aortic Atherosclerosis (ICD10-I70.0). Electronically Signed   By: Marijo Conception M.D.   On: 02/02/2021 18:01    Scheduled Meds: . cloNIDine  0.1 mg Oral TID  . diltiazem  360 mg Oral QHS  . ezetimibe  10 mg Oral Daily  . feeding supplement  237 mL Oral TID BM  . guanFACINE  1 mg Oral Daily  . heparin  5,000 Units Subcutaneous Q8H  . hydrALAZINE  100 mg Oral TID  . irbesartan  300 mg Oral Daily  . loteprednol  1 drop Both Eyes Daily  . metoprolol tartrate  12.5 mg Oral BID  . mirtazapine  7.5 mg Oral QHS  . mometasone-formoterol  2 puff Inhalation BID  . multivitamin with minerals  1 tablet Oral Daily  . Netarsudil Dimesylate  1 drop Both Eyes QPM  . pantoprazole  40 mg Oral Daily   Continuous Infusions: . sodium chloride 100 mL/hr at 02/04/21 0807     LOS: 2 days   Time spent: 35 mins   Margurite Duffy Wynetta Emery, MD How to contact the [facility] [place] [facility] Attending or Consulting provider [place] or covering provider during after hours [place], for this patient?  1. Check the care team in [facility] and look for a) attending/consulting TR[facility] provider listed and b) the [facility] team listed 2. Log into  [email or web address] and use [place]'s universal password to access. If you do not have the password, please contact the hospital operator. 3. Locate the [facility] provider you are looking for under [facility] and page to a number that you can be directly reached. 4. If you still have difficulty reaching the provider, please page the [facility] (Director on Call) for the Hospitalists listed on amion for assistance.  02/04/2021, 4:42 PM

## 2021-02-04 NOTE — Progress Notes (Signed)
Patient ID: Stephanie West, female   DOB: 01-19-1941, 80 y.o.   MRN: [id number] S: Feeling much better today.  AA&Ox3.  No complaints. O:BP (!) 187/59 (BP Location: Left Arm)   Pulse 73   Temp 98.1 F (36.7 C)   Resp 19   Ht 5\' 4"  (1.626 m)   Wt 53.9 kg   SpO2 95%   BMI 20.40 kg/m   Intake/Output Summary (Last 24 hours) at 02/04/2021 0912 Last data filed at 02/04/2021 0700 Gross per 24 hour  Intake 1809.04 ml  Output 1150 ml  Net 659.04 ml   Intake/Output: I/O last 3 completed shifts: In: 3190.3 [P.O.:1200; I.V.:1990.3] Out: 1150 [Urine:1150]  Intake/Output this shift:  No intake/output data recorded. Weight change: 3 kg Gen: NAD CVS: RRR Resp: cta Abd: +BS, soft, NT/ND Ext: no edema  Recent Labs  Lab 02/02/21 1757 02/03/21 0229 02/03/21 0953 02/03/21 1113 02/03/21 1332 02/03/21 1907 02/03/21 2033 02/04/21 0129 02/04/21 0403  NA 117* 118* 120* 119* 119* 121* 120* 122* 124*  123*  K 4.1 3.7 3.3*  --   --   --   --   --  4.4  CL 84* 88* 88*  --   --   --   --   --  95*  CO2 22 22 23   --   --   --   --   --  22  GLUCOSE 123* 100* 158*  --   --   --   --   --  99  BUN 14 11 10   --   --   --   --   --  11  CREATININE 0.98 0.82 0.88  --   --   --   --   --  0.91  ALBUMIN 4.2  --  3.5  --   --   --   --   --   --   CALCIUM 9.2 8.1* 8.7*  --   --   --   --   --  8.0*  PHOS 3.7  --  2.3*  --   --   --   --   --   --   AST 22  --   --   --   --   --   --   --   --   ALT 14  --   --   --   --   --   --   --   --    Liver Function Tests: Recent Labs  Lab 02/02/21 1757 02/03/21 0953  AST 22  --   ALT 14  --   ALKPHOS 65  --   BILITOT 0.7  --   PROT 7.5  --   ALBUMIN 4.2 3.5   No results for input(s): LIPASE, AMYLASE in the last 168 hours. No results for input(s): AMMONIA in the last 168 hours. CBC: Recent Labs  Lab 02/02/21 1757 02/03/21 0229  WBC 4.4 3.0*  NEUTROABS 2.2  --   HGB 13.8 11.8*  HCT 39.0 33.5*  MCV 90.5 91.0  PLT 287 223   Cardiac  Enzymes: No results for input(s): CKTOTAL, CKMB, CKMBINDEX, TROPONINI in the last 168 hours. CBG: No results for input(s): GLUCAP in the last 168 hours.  Iron Studies: No results for input(s): IRON, TIBC, TRANSFERRIN, FERRITIN in the last 72 hours. Studies/Results: DG Chest Port 1 View  Result Date: 02/02/2021 CLINICAL DATA:  Shortness of breath. EXAM: PORTABLE CHEST 1 VIEW  COMPARISON:  January 17, 2021. FINDINGS: The heart size and mediastinal contours are within normal limits. Stable right basilar scarring is noted. No acute pulmonary disease is noted. No pneumothorax or pleural effusion is noted. The visualized skeletal structures are unremarkable. IMPRESSION: No acute cardiopulmonary abnormality seen. Aortic Atherosclerosis (ICD10-I70.0). Electronically Signed   By: Marijo Conception M.D.   On: 02/02/2021 18:01   . cloNIDine  0.1 mg Oral TID  . diltiazem  360 mg Oral QHS  . ezetimibe  10 mg Oral Daily  . feeding supplement  237 mL Oral TID BM  . guanFACINE  1 mg Oral Daily  . heparin  5,000 Units Subcutaneous Q8H  . hydrALAZINE  100 mg Oral TID  . irbesartan  300 mg Oral Daily  . loteprednol  1 drop Both Eyes Daily  . metoprolol tartrate  12.5 mg Oral BID  . mometasone-formoterol  2 puff Inhalation BID  . multivitamin with minerals  1 tablet Oral Daily  . Netarsudil Dimesylate  1 drop Both Eyes QPM  . pantoprazole  40 mg Oral Daily    BMET    Component Value Date/Time   NA 124 (L) 02/04/2021 0403   NA 123 (L) 02/04/2021 0403   K 4.4 02/04/2021 0403   CL 95 (L) 02/04/2021 0403   CO2 22 02/04/2021 0403   GLUCOSE 99 02/04/2021 0403   BUN 11 02/04/2021 0403   CREATININE 0.91 02/04/2021 0403   CREATININE 1.13 (H) 06/30/2020 0925   CALCIUM 8.0 (L) 02/04/2021 0403   GFRNONAA >60 02/04/2021 0403   GFRNONAA 42 (L) 06/11/2020 1324   GFRAA 49 (L) 06/11/2020 1324   CBC    Component Value Date/Time   WBC 3.0 (L) 02/03/2021 0229   RBC 3.68 (L) 02/03/2021 0229   HGB 11.8 (L)  02/03/2021 0229   HCT 33.5 (L) 02/03/2021 0229   PLT 223 02/03/2021 0229   MCV 91.0 02/03/2021 0229   MCH 32.1 02/03/2021 0229   MCHC 35.2 02/03/2021 0229   RDW 11.9 02/03/2021 0229   LYMPHSABS 1.3 02/02/2021 1757   MONOABS 0.7 02/02/2021 1757   EOSABS 0.1 02/02/2021 1757   BASOSABS 0.1 02/02/2021 1757    Assessment/Plan: 1.  Hyponatremia - presumably due to volume depletion given poor po intake for the past 3 weeks.  Unfortunately the urine sodium was obtained while she was receiving IV NS and not at initial presentation.  Agree with initial assessment as the etiology and treatment plan.  Mentally she appears to have continued to improve and is oriented x 3. 1. Sodium levels have continued to improve with NS. 2. This will be a recurring issue unless her anorexia and aversion to eating/drinking is addressed.  She may need assisted living and definitely needs GI evaluation. 3. Will not see pt over the weekend but will review labs via EMR. 2. HTN - not sure of her home regimen.  Currently on clonidine 0.1 mg tid, dilt CD 360 mg daily and hydralazine 100 mg tid, metoprolol 12.5 mg bid, and avapro 300 mg daily.  BP variable and will need to check orthostatics.  She is likely hypertensive supine given IV NS but may drop when upright.  3. FTT - Pt has presented to ED 5 times in the last 2 months.  Has ongoing anorexia and hyponatremia.  Will need to see GI for her anorexia but may also benefit from Palliative care consult since she is a full code.  4. Disposition - will need to see serum  sodium levels get to 130 and stable off of IVF's before she can safely be discharged to home.    Donetta Potts, MD [employer] [phone]

## 2021-02-04 NOTE — Progress Notes (Signed)
Physical Therapy Treatment Patient Details Name: Stephanie West MRN: [id number] DOB: [birth year] Today's Date: 02/04/2021    History of Present Illness Stephanie West is an 80 y.o. female with a PMH significant for HTN, mitral valve prolapse, GERD, and asthma who was sent from her PCP's office due to low sodium level noticed on labs.  Repeat labs in ED showed Na of 117 and she was admitted for further treatment.  She apparently was very confused yesterday and her daughter had to give information.  She reports that the patient has not been eating or drinking much at all for the past 3 weeks.  She had been to the ED on 01/17/21 with dizziness and started on meclizine.  Her sodium was 128 at that time.  She has a history of low sodium levels as is seen by the trend below.     She is feeling better today and denies any N/V/D but just "can't eat, I'm not hungry".  No SOB, DOE, lower extremity edema.  She does not know what happened or why she is in the hospital.      PT Comments    Patient able to transition to seated EOB without assist. She demonstrates good sitting tolerance and sitting balance while completing seated exercises. Patient able to transfer to standing with RW with minimal assist and is unsteady upon initial standing which resolves after about 30 seconds. Patient ambulates increased distance today with use of RW without loss of balance. Patient returned to bed at end of session and is encouraged to eat the remainder of her lunch. Patient will benefit from continued physical therapy in hospital and recommended venue below to increase strength, balance, endurance for safe ADLs and gait.    Follow Up Recommendations  Home health PT;Supervision - Intermittent;Supervision for mobility/OOB     Equipment Recommendations  3in1 (PT);Rolling walker with 5" wheels    Recommendations for Other Services       Precautions / Restrictions Precautions Precautions: Fall Restrictions Weight  Bearing Restrictions: No    Mobility  Bed Mobility Overal bed mobility: Modified Independent                  Transfers Overall transfer level: Needs assistance Equipment used: Rolling walker (2 wheeled) Transfers: Sit to/from [facility] Sit to Stand: Min assist Stand pivot transfers: Min assist       General transfer comment: requires min assist and use of RW to transfer to standing  Ambulation/Gait Ambulation/Gait assistance: Min guard Gait Distance (Feet): 120 Feet Assistive device: Rolling walker (2 wheeled) Gait Pattern/deviations: Step-through pattern;Decreased step length - right;Decreased step length - left;Decreased stride length Gait velocity: decreased   General Gait Details: slow, labored cadence with RW without loss of balance   Stairs             Wheelchair Mobility    Modified Rankin (Stroke Patients Only)       Balance Overall balance assessment: Needs assistance Sitting-balance support: Feet supported;No upper extremity supported Sitting balance-Leahy Scale: Good Sitting balance - Comments: seated EOB   Standing balance support: During functional activity;No upper extremity supported Standing balance-Leahy Scale: Fair Standing balance comment: good/fair with RW                            Cognition Arousal/Alertness: Awake/alert Behavior During Therapy: WFL for tasks assessed/performed Overall Cognitive Status: Within Functional Limits for tasks assessed  Exercises General Exercises - Lower Extremity Long Arc Quad: AROM;Both;10 reps;Seated Hip Flexion/Marching: AROM;Both;10 reps;Seated Toe Raises: AROM;Both;10 reps;Seated Heel Raises: AROM;Both;10 reps;Seated    General Comments        Pertinent Vitals/Pain Pain Assessment: No/denies pain    Home Living                      Prior Function            PT Goals (current goals  can now be found in the care plan section) Acute Rehab PT Goals Patient Stated Goal: Return to prior level of function. PT Goal Formulation: With patient Time For Goal Achievement: 02/17/21 Potential to Achieve Goals: Fair Progress towards PT goals: Progressing toward goals    Frequency    Min 3X/week      PT Plan Current plan remains appropriate;Equipment recommendations need to be updated    Co-evaluation              AM-PAC PT "6 Clicks" Mobility   Outcome Measure  Help needed turning from your back to your side while in a flat bed without using bedrails?: None Help needed moving from lying on your back to sitting on the side of a flat bed without using bedrails?: A Little Help needed moving to and from a bed to a chair (including a wheelchair)?: A Little Help needed standing up from a chair using your arms (e.g., wheelchair or bedside chair)?: A Little Help needed to walk in hospital room?: A Little Help needed climbing 3-5 steps with a railing? : A Little 6 Click Score: 19    End of Session   Activity Tolerance: Patient limited by fatigue Patient left: in bed;with call bell/phone within reach;with bed alarm set Nurse Communication: Mobility status PT Visit Diagnosis: Unsteadiness on feet (R26.81);Other abnormalities of gait and mobility (R26.89);Muscle weakness (generalized) (M62.81)     Time: 1340-1410 PT Time Calculation (min) (ACUTE ONLY): 30 min  Charges:  $Therapeutic Exercise: 8-22 mins $Therapeutic Activity: 8-22 mins                     2:21 PM, 02/04/21 Mearl Latin PT, DPT Physical Therapist at [facility]

## 2021-02-04 NOTE — Consult Note (Signed)
Maylon Peppers, M.D. Gastroenterology & Hepatology                                           Patient Name: Stephanie West Account #: @FLAACCTNO @   MRN: [id number] Admission Date: 02/02/2021 Date of Evaluation:  02/04/2021 Time of Evaluation: 1:11 PM   Referring Physician: Irwin Brakeman, MD  Chief Complaint: Anorexia  HPI:  This is a 80 y.o. female with past medical history of mitral valve prolapse, asthma, GERD complicated by Schatzki's ring, hypertension, who was admitted to the hospital after presenting severe hyponatremia.  Gastroenterology was consulted for management of anorexia and failure to thrive.  Patient clinical notes, the patient was brought to the hospital 2 days ago after she was found to have low sodium of 117 at her PCPs office along with tremors and generalized weakness.  It seems that the patient was altered when she came to the hospital and could not provide any information.  Daughter is not at the bedside today but based on medical record the patient has not been eating for the last 3 weeks.  Today the patient is more awake and she is able to provide any information.  She reports that she has felt no desire to eat any food for the last 3 weeks as she reports that food tastes very bad and she did not have any desire to eat.  She denies having any dysphagia, odynophagia, nausea, vomiting, fever, chills, abdominal pain, abdominal distention, melena, hematochezia, lightheadedness, dizziness.  The patient reported to me that she felt extremely depressed for the last 2 years as she lost her husband.  She states that she has not been taking any medication for depression and has not seen a psychiatrist for this.  She believes that the change in her appetite is likely related to feeling depressed.  Upon review of her medical chart, I do not see any recent cross-sectional abdominal imaging.  Her most recent CT scan was a CT angio abdomen pelvis which was performed in 2016 which  showed colonic diverticulosis and a large hiatal hernia but no other major alterations.  Review of the work-up performed during her current hospitalization showed improvement of her sodium up to 124 today.  Her cortisol level was normal at 13.1, TSH was 2.4, CBC only showed mild anemia of 11 point and mild leukopenia of 3000 but no other alterations.  Liver enzymes were within normal limits upon admission.  Last EGD:04/15/2015, Esophagus:  Tortuous body of esophagus with normal mucosa. Noncritical Schatzki's ring noted at GE junction. GEJ:  29 cm Hiatus:  35 cm Stomach:  Stomach was empty and distended very well with insufflation. Folds in the proximal stomach were normal. Examination of mucosa at gastric body, antrum, pyloric channel, angularis fundus and cardia was normal. Hernia and ring were easily seen on this view. Duodenum:  Normal bulbar and post bulbar mucosa. Last Colonoscopy: Patient reports he was performed 3 years ago but no reports available, which she reports was normal.  Past Medical History: SEE CHRONIC ISSSUES: Past Medical History:  Diagnosis Date  . Asthma   . Dysphagia 05/10/2017  . Elevated transaminase level   . GERD (gastroesophageal reflux disease)   . Hypertension   . MVP (mitral valve prolapse)    Past Surgical History:  Past Surgical History:  Procedure Laterality Date  . COLONOSCOPY    . ESOPHAGEAL  DILATION  07/30/2015   Procedure: ESOPHAGEAL DILATION;  Surgeon: Rogene Houston, MD;  [facility];  Service: Endoscopy;;  . ESOPHAGOGASTRODUODENOSCOPY N/A 04/15/2015   Procedure: ESOPHAGOGASTRODUODENOSCOPY (EGD);  Surgeon: Rogene Houston, MD;  Location: [facility];  Service: Endoscopy;  Laterality: N/A;  125 - moved to 1:00, Ann to notify pt  . ESOPHAGOGASTRODUODENOSCOPY N/A 07/30/2015   Procedure: ESOPHAGOGASTRODUODENOSCOPY (EGD);  Surgeon: Rogene Houston, MD;  [facility]: [facility];  Service: Endoscopy;  Laterality: N/A;  1040  .  ESOPHAGOGASTRODUODENOSCOPY (EGD) WITH ESOPHAGEAL DILATION N/A 03/07/2013   Procedure: ESOPHAGOGASTRODUODENOSCOPY (EGD) WITH ESOPHAGEAL DILATION;  Surgeon: Rogene Houston, MD;  Location: [facility];  Service: Endoscopy;  Laterality: N/A;  830  . EYE SURGERY    . LAPAROSCOPIC TUBAL LIGATION  1984  . UPPER GASTROINTESTINAL ENDOSCOPY     Family History:  Family History  Problem Relation Age of Onset  . Hypertension Mother   . Prostate cancer Father    Social History:  Social History   Tobacco Use  . Smoking status: Never Smoker  . Smokeless tobacco: Never Used  Vaping Use  . Vaping Use: Never used  Substance Use Topics  . Alcohol use: No    Alcohol/week: 0.0 standard drinks  . Drug use: No    Home Medications:  Prior to Admission medications   Medication Sig Start Date End Date Taking? Authorizing Provider  albuterol (PROVENTIL HFA;VENTOLIN HFA) 108 (90 BASE) MCG/ACT inhaler Inhale 2 puffs into the lungs every 6 (six) hours as needed for wheezing or shortness of breath.   Yes [provider]  ALPRAZolam (XANAX) 0.25 MG tablet Take 1 tablet by mouth 2 (two) times daily as needed for anxiety. 04/30/19  Yes [provider]  cloNIDine (CATAPRES) 0.1 MG tablet Take 1 tablet (0.1 mg total) by mouth 3 (three) times daily. 06/08/20  Yes BranchAlphonse Guild, MD  diltiazem (CARDIZEM CD) 360 MG 24 hr capsule Take 360 mg by mouth daily.   Yes [provider]  esomeprazole (NEXIUM) 40 MG capsule Take 40 mg by mouth daily before breakfast.    Yes [provider]  ezetimibe (ZETIA) 10 MG tablet Take 10 mg by mouth daily.   Yes [provider]  Fluticasone-Salmeterol (ADVAIR) 250-50 MCG/DOSE AEPB Inhale 1 puff into the lungs every 12 (twelve) hours.   Yes [provider]  guanFACINE (INTUNIV) 1 MG TB24 ER tablet Take 1 mg by mouth daily. 04/01/20  Yes [provider]  hydrALAZINE (APRESOLINE) 25 MG tablet Take 1.5 tablets (37.5 mg total)  by mouth 3 (three) times daily. Patient taking differently: Take 100 mg by mouth 3 (three) times daily. 11/29/20 02/27/21 Yes Branch, Alphonse Guild, MD  loteprednol (LOTEMAX) 0.2 % SUSP Place 1 drop into both eyes daily.    Yes [provider]  meclizine (ANTIVERT) 25 MG tablet Take 1 tablet (25 mg total) by mouth 3 (three) times daily as needed for dizziness. Patient taking differently: Take 25 mg by mouth daily as needed for dizziness. 01/17/21  Yes Jacqlyn Larsen, PA-C  Netarsudil Dimesylate 0.02 % SOLN Place 1 drop into both eyes every evening.   Yes [provider]  olmesartan (BENICAR) 40 MG tablet Take 1 tablet by mouth daily. 06/18/19  Yes [provider]  potassium chloride (KLOR-CON) 10 MEQ tablet Take 10 mEq by mouth 3 (three) times daily. 01/14/21  Yes [provider]  ciprofloxacin (CIPRO) 250 MG tablet Take 250 mg by mouth  2 (two) times daily. Starting 3.18.22 x 10 days. Patient not taking: No sig reported 01/07/21   [provider]  ciprofloxacin (CIPRO) 500 MG tablet Take 1 tablet (500 mg total) by mouth 2 (two) times daily. Patient not taking: No sig reported 11/28/20   Faustino Congress, NP  fluocinonide cream (LIDEX) 0.05 %  01/14/21   [provider]  furosemide (LASIX) 20 MG tablet Take 1 tablet (20 mg total) by mouth as needed for edema (swelling). Patient not taking: Reported on 02/02/2021 07/16/20   Arnoldo Lenis, MD  metoprolol tartrate (LOPRESSOR) 25 MG tablet Take 0.5 tablets (12.5 mg total) by mouth 2 (two) times daily. Patient not taking: Reported on 02/02/2021 01/13/21   Arnoldo Lenis, MD  multivitamin-iron-minerals-folic acid (CENTRUM) chewable tablet Chew 1 tablet by mouth daily. Patient not taking: Reported on 02/02/2021    [provider]    Inpatient Medications:  Current Facility-Administered Medications:  .  0.9 %  sodium chloride infusion, , Intravenous, Continuous, Zierle-Ghosh, Asia B, DO, Last Rate:  100 mL/hr at 02/04/21 0807, New Bag at 02/04/21 0807 .  acetaminophen (TYLENOL) tablet 650 mg, 650 mg, Oral, Q6H PRN **OR** acetaminophen (TYLENOL) suppository 650 mg, 650 mg, Rectal, Q6H PRN, Zierle-Ghosh, Asia B, DO .  albuterol (VENTOLIN HFA) 108 (90 Base) MCG/ACT inhaler 2 puff, 2 puff, Inhalation, Q6H PRN, Zierle-Ghosh, Asia B, DO .  ALPRAZolam (XANAX) tablet 0.25 mg, 0.25 mg, Oral, BID PRN, Zierle-Ghosh, Asia B, DO, 0.25 mg at 02/03/21 0932 .  cloNIDine (CATAPRES) tablet 0.1 mg, 0.1 mg, Oral, TID, Zierle-Ghosh, Asia B, DO, 0.1 mg at 02/04/21 0809 .  diltiazem (CARDIZEM CD) 24 hr capsule 360 mg, 360 mg, Oral, QHS, Zierle-Ghosh, Asia B, DO, 360 mg at 02/03/21 0927 .  ezetimibe (ZETIA) tablet 10 mg, 10 mg, Oral, Daily, Zierle-Ghosh, Asia B, DO, 10 mg at 02/04/21 0819 .  feeding supplement (ENSURE ENLIVE / ENSURE PLUS) liquid 237 mL, 237 mL, Oral, TID BM, Johnson, Clanford L, MD, 237 mL at 02/04/21 0809 .  guanFACINE (INTUNIV) ER tablet 1 mg, 1 mg, Oral, Daily, Zierle-Ghosh, Asia B, DO, 1 mg at 02/04/21 0810 .  heparin injection 5,000 Units, 5,000 Units, Subcutaneous, Q8H, Zierle-Ghosh, Asia B, DO, 5,000 Units at 02/03/21 2306 .  hydrALAZINE (APRESOLINE) tablet 100 mg, 100 mg, Oral, TID, Zierle-Ghosh, Asia B, DO, 100 mg at 02/04/21 0808 .  irbesartan (AVAPRO) tablet 300 mg, 300 mg, Oral, Daily, Johnson, Clanford L, MD, 300 mg at 02/04/21 0808 .  loteprednol (LOTEMAX) 0.2 % ophthalmic suspension 1 drop, 1 drop, Both Eyes, Daily, Zierle-Ghosh, Asia B, DO .  metoprolol tartrate (LOPRESSOR) tablet 12.5 mg, 12.5 mg, Oral, BID, Zierle-Ghosh, Asia B, DO, 12.5 mg at 02/04/21 0807 .  mometasone-formoterol (DULERA) 200-5 MCG/ACT inhaler 2 puff, 2 puff, Inhalation, BID, Zierle-Ghosh, Asia B, DO, 2 puff at 02/04/21 0814 .  multivitamin with minerals tablet 1 tablet, 1 tablet, Oral, Daily, Wynetta Emery, Clanford L, MD, 1 tablet at 02/04/21 0811 .  Netarsudil Dimesylate 0.02 % SOLN 1 drop, 1 drop, Both Eyes, QPM,  Zierle-Ghosh, Asia B, DO, 1 drop at 02/03/21 1718 .  ondansetron (ZOFRAN) tablet 4 mg, 4 mg, Oral, Q6H PRN **OR** ondansetron (ZOFRAN) injection 4 mg, 4 mg, Intravenous, Q6H PRN, Zierle-Ghosh, Asia B, DO .  pantoprazole (PROTONIX) EC tablet 40 mg, 40 mg, Oral, Daily, Zierle-Ghosh, Asia B, DO, 40 mg at 02/04/21 0807 .  traMADol (ULTRAM) tablet 50 mg, 50 mg, Oral, Q6H PRN, Zierle-Ghosh, Asia B, DO Allergies:  Apple, Aspirin, Avelox [moxifloxacin hcl in nacl], Bee venom, Benadryl [diphenhydramine hcl], Chicken allergy, Codeine, Factive [gemifloxacin mesylate], Factive [gemifloxacin], Hctz [hydrochlorothiazide], Iodinated diagnostic agents, Iodine, Levaquin [levofloxacin in d5w], Levofloxacin, Penicillins, Sulfa antibiotics, and Tequin [gatifloxacin]  Complete Review of Systems: GENERAL: negative for malaise, night sweats HEENT: No changes in hearing or vision, no nose bleeds or other nasal problems. NECK: Negative for lumps, goiter, pain and significant neck swelling RESPIRATORY: Negative for cough, wheezing CARDIOVASCULAR: Negative for chest pain, leg swelling, palpitations, orthopnea GI: SEE HPI MUSCULOSKELETAL: Negative for joint pain or swelling, back pain, and muscle pain. SKIN: Negative for lesions, rash PSYCH: Negative for sleep disturbance, mood disorder and recent psychosocial stressors. HEMATOLOGY Negative for prolonged bleeding, bruising easily, and swollen nodes. ENDOCRINE: Negative for cold or heat intolerance, polyuria, polydipsia and goiter. NEURO: negative for tremor, gait imbalance, syncope and seizures. The remainder of the review of systems is noncontributory.  Physical Exam: BP (!) 187/59 (BP Location: Left Arm)   Pulse 73   Temp 98.1 F (36.7 C)   Resp 19   Ht 5' 4"  (1.626 m)   Wt 51.3 kg   SpO2 95%   BMI 19.41 kg/m  GENERAL: The patient is AO x3, in no acute distress. Elder . HEENT: Head is normocephalic and atraumatic. EOMI are intact. Mouth is well hydrated and  without lesions. NECK: Supple. No masses LUNGS: Clear to auscultation. No presence of rhonchi/wheezing/rales. Adequate chest expansion HEART: RRR, normal s1 and s2. ABDOMEN: Soft, nontender, no guarding, no peritoneal signs, and nondistended. BS +. No masses. EXTREMITIES: Without any cyanosis, clubbing, rash, lesions or edema. NEUROLOGIC: AOx3, no focal motor deficit. SKIN: no jaundice, no rashes  Laboratory Data CBC:     Component Value Date/Time   WBC 3.0 (L) 02/03/2021 0229   RBC 3.68 (L) 02/03/2021 0229   HGB 11.8 (L) 02/03/2021 0229   HCT 33.5 (L) 02/03/2021 0229   PLT 223 02/03/2021 0229   MCV 91.0 02/03/2021 0229   MCH 32.1 02/03/2021 0229   MCHC 35.2 02/03/2021 0229   RDW 11.9 02/03/2021 0229   LYMPHSABS 1.3 02/02/2021 1757   MONOABS 0.7 02/02/2021 1757   EOSABS 0.1 02/02/2021 1757   BASOSABS 0.1 02/02/2021 1757   COAG: No results found for: INR, PROTIME  BMP:  BMP Latest Ref Rng & Units 02/04/2021 02/04/2021 02/04/2021  Glucose 70 - 99 mg/dL - 99 -  BUN 8 - 23 mg/dL - 11 -  Creatinine 0.44 - 1.00 mg/dL - 0.91 -  BUN/Creat Ratio 6 - 22 (calc) - - -  Sodium 135 - 145 mmol/L 124(L) 124(L) 123(L)  Potassium 3.5 - 5.1 mmol/L - 4.4 -  Chloride 98 - 111 mmol/L - 95(L) -  CO2 22 - 32 mmol/L - 22 -  Calcium 8.9 - 10.3 mg/dL - 8.0(L) -    HEPATIC:  Hepatic Function Latest Ref Rng & Units 02/03/2021 02/02/2021 01/17/2021  Total Protein 6.5 - 8.1 g/dL - 7.5 7.5  Albumin 3.5 - 5.0 g/dL 3.5 4.2 4.1  AST 15 - 41 U/L - 22 18  ALT 0 - 44 U/L - 14 14  Alk Phosphatase 38 - 126 U/L - 65 59  Total Bilirubin 0.3 - 1.2 mg/dL - 0.7 0.3  Bilirubin, Direct 0.0 - 0.3 mg/dL - - -    CARDIAC:  Lab Results  Component Value Date   TROPONINI <0.03 07/03/2015     Imaging: I personally reviewed and interpreted the available imaging.  Assessment & Plan: This is  a 80 y.o. female with past medical history of mitral valve prolapse, asthma, GERD complicated by Schatzki's ring, hypertension,  who was admitted to the hospital after presenting severe hyponatremia.  Gastroenterology was consulted for management of anorexia and failure to thrive.  The patient has presented poor oral intake for the last weeks which is likely leading to her hyponatremia.  Patient was very clear to me when she explained that she does not have too much appetite.  Also she is expressing significant symptoms of depression that are likely causing most of her symptoms.  I do not consider an endoscopic evaluation is warranted at this point as the patient is able to swallow her food without any problem and she is not presenting any other concomitant symptoms/red flag signs that would warrant an endoscopic evaluation.  She may benefit from an evaluation with cross-sectional abdominal imaging of her chest, abdomen and pelvis to evaluate for any underlying malignancy.  At this point I will recommend starting low-dose Remeron as this will increase her appetite but will also help with her depression up to some point.  I do believe that she will benefit from an evaluation by psychiatry as her depression may have a big component in her anorexia.  Patient is willing to eat today and I encouraged her to do so; she may also benefit from taking protein supplements on a regular basis.   # Anorexia #Failure to thrive # Depression #Severe hyponatremia - Start Remeron 7.5 mg bedtime - Consider chest/abdomen/pelvis CT with IV contrast - Hyponatremia management by hospitalist and nephrology - Psychiatry evaluation - Protein supplements TID - GI service will sign-off, please call us back if you have any more questions.  Harvel Quale, MD Gastroenterology and Hepatology [facility]

## 2021-02-05 DIAGNOSIS — I169 Hypertensive crisis, unspecified: Secondary | ICD-10-CM | POA: Diagnosis not present

## 2021-02-05 DIAGNOSIS — E878 Other disorders of electrolyte and fluid balance, not elsewhere classified: Secondary | ICD-10-CM | POA: Diagnosis not present

## 2021-02-05 DIAGNOSIS — E871 Hypo-osmolality and hyponatremia: Secondary | ICD-10-CM | POA: Diagnosis not present

## 2021-02-05 DIAGNOSIS — G9341 Metabolic encephalopathy: Secondary | ICD-10-CM | POA: Diagnosis not present

## 2021-02-05 LAB — BASIC METABOLIC PANEL
Anion gap: 7 (ref 5–15)
BUN: 9 mg/dL (ref 8–23)
CO2: 24 mmol/L (ref 22–32)
Calcium: 8.1 mg/dL — ABNORMAL LOW (ref 8.9–10.3)
Chloride: 92 mmol/L — ABNORMAL LOW (ref 98–111)
Creatinine, Ser: 0.82 mg/dL (ref 0.44–1.00)
GFR, Estimated: >60 mL/min (ref >60)
Glucose, Bld: 103 mg/dL — ABNORMAL HIGH (ref 70–99)
Potassium: 3.9 mmol/L (ref 3.5–5.1)
Sodium: 123 mmol/L — ABNORMAL LOW (ref 135–145)

## 2021-02-05 MED ORDER — SENNOSIDES-DOCUSATE SODIUM 8.6-50 MG PO TABS
1.0000 | ORAL_TABLET | Freq: Two times a day (BID) | ORAL | Status: AC
  Administered 2021-02-05 – 2021-02-07 (×6): 1 via ORAL
  Filled 2021-02-05 (×6): qty 1

## 2021-02-05 NOTE — TOC Progression Note (Signed)
Transition of Care [facility]) - Progression Note    Patient Details  Name: Stephanie West MRN: [id number] Date of Birth: 11-Sep-1941  [facility] of [facility]) CM/SW Contact  Natasha Bence, LCSW Phone Number: 02/05/2021, 1:16 PM  Clinical Narrative:    CSW notified of [place] needs. CSW referred patient to [place] with [facility]. TOC to follow.        Expected Discharge Plan and Services                                                 Social Determinants of Health (SDOH) Interventions    Readmission Risk Interventions No flowsheet data found.

## 2021-02-05 NOTE — Progress Notes (Signed)
PROGRESS NOTE   Stephanie West  ZHG:[id number] DOB: 1941/01/30 DOA: 02/02/2021 PCP: Celene Squibb, MD   Chief Complaint  Patient presents with  . Abnormal Lab   Level of care: Telemetry  Brief Admission History:  80 y.o. female, with history of mitral valve prolapse, hypertension, GERD, asthma, and more presents to the ED with a chief complaint of low sodium.  Patient had presented to her PCP for bilateral arm tremors, generalized weakness, fatigue blood work was done that showed a sodium of 117.  Patient usually runs a slightly low between 126 and 132, but this was low even for her.  Unfortunately patient is not able to give much history because she is confused.  Daughter, [name], is at bedside and reports that patient has not been eating for last 3 weeks mostly due to ongoing depression from loss of husband 2 years ago.    Assessment & Plan:   Principal Problem:   Acute hyponatremia Active Problems:   Hypertensive crisis   Hypochloremia   Anxiety   Acute metabolic encephalopathy   Malnutrition of moderate degree   1. Severe hyponatremia - no significant change from yesterday, slowly improving, presumably from poor oral intake, continue IV fluid with normal saline infusion per renal team recs.  Appreciate nephrology consultation and recommendations.  Continue to follow the BMP.  2. Metabolic encephalopathy - RESOLVED.  Mentation improved.   3. Hypertensive urgency - BP difficult to control.  Resumed all home BP meds and BP slowly better controlled but remains suboptimal.  4. Severe depression -since loss of husband, when medically cleared will ask for TTS evaluation. She may need inpatient behavioral health treatment.  5. Anorexia - spoke with GI, trial of remeron QHS started.  CT chest abd pelvis to evaluate for underlying malignancy was negative for any acute findings.  6. GERD - remains on protonix for GI protection.  7. Meningioma - she was scheduled to see neurosurgery outpatient  on Monday 4/18, I asked daughter to reschedule as she likely will still be in hospital.    DVT prophylaxis: SCD  Code Status: full  Family Communication: daughter updated telephone 4/16 Disposition: home with home health Status is: Inpatient  Remains inpatient appropriate because:Persistent severe electrolyte disturbances and Inpatient level of care appropriate due to severity of illness  Dispo: The patient is from: Home              Anticipated d/c is to: TBD              Patient currently is not medically stable to d/c.   Difficult to place patient No  Consultants:   Nephrology   Procedures:   N/a   Antimicrobials:  N/a    Subjective: Pt reports she feels weak today but she is still willing to try eating breakfast, no other specific complaints, has gotten out of bed yesterday with PT.   Objective: Vitals:   02/04/21 2219 02/05/21 0519 02/05/21 0827 02/05/21 0843  BP:  (!) 147/71  (!) 164/64  Pulse: 91 72  76  Resp:  18    Temp:  98 F (36.7 C)    TempSrc:  Oral    SpO2:  94% 95%   Weight:      Height:        Intake/Output Summary (Last 24 hours) at 02/05/2021 1046 Last data filed at 02/05/2021 1028 Gross per 24 hour  Intake 1620 ml  Output 400 ml  Net 1220 ml   [employer]  02/02/21 2312 02/04/21 0545 02/04/21 1206  Weight: 50.9 kg 53.9 kg 51.3 kg    Examination:  General exam: frail, elderly female, Appears calm and comfortable  Respiratory system: Clear to auscultation. Respiratory effort normal. Cardiovascular system: normal S1 & S2 heard. No JVD, murmurs, rubs, gallops or clicks. No pedal edema. Gastrointestinal system: Abdomen is nondistended, soft and nontender. No organomegaly or masses felt. Normal bowel sounds heard. Central nervous system: Alert and oriented. No focal neurological deficits. Extremities: Symmetric 5 x 5 power. Skin: No rashes, lesions or ulcers Psychiatry: Judgement and insight appear poor. Mood & affect depressed.   Data  Reviewed: I have personally reviewed following labs and imaging studies  CBC: Recent Labs  Lab 02/02/21 1757 02/03/21 0229  WBC 4.4 3.0*  NEUTROABS 2.2  --   HGB 13.8 11.8*  HCT 39.0 33.5*  MCV 90.5 91.0  PLT 287 814    Basic Metabolic Panel: Recent Labs  Lab 02/02/21 1757 02/03/21 0229 02/03/21 0953 02/03/21 1113 02/04/21 0129 02/04/21 0403 02/04/21 0935 02/04/21 1606 02/05/21 0554  NA 117* 118* 120*   < > 122* 124*  123* 124* 122* 123*  K 4.1 3.7 3.3*  --   --  4.4  --   --  3.9  CL 84* 88* 88*  --   --  95*  --   --  92*  CO2 22 22 23   --   --  22  --   --  24  GLUCOSE 123* 100* 158*  --   --  99  --   --  103*  BUN 14 11 10   --   --  11  --   --  9  CREATININE 0.98 0.82 0.88  --   --  0.91  --   --  0.82  CALCIUM 9.2 8.1* 8.7*  --   --  8.0*  --   --  8.1*  MG 1.9 1.6*  --   --   --  2.3  --   --   --   PHOS 3.7  --  2.3*  --   --   --   --   --   --    < > = values in this interval not displayed.    GFR: Estimated Creatinine Clearance: 42.8 mL/min (by C-G formula based on SCr of 0.82 mg/dL).  Liver Function Tests: Recent Labs  Lab 02/02/21 1757 02/03/21 0953  AST 22  --   ALT 14  --   ALKPHOS 65  --   BILITOT 0.7  --   PROT 7.5  --   ALBUMIN 4.2 3.5    CBG: No results for input(s): GLUCAP in the last 168 hours.  Recent Results (from the past 240 hour(s))  SARS CORONAVIRUS 2 (TAT 6-24 HRS) Nasopharyngeal Nasopharyngeal Swab     Status: None   Collection Time: 02/02/21  9:14 PM   Specimen: Nasopharyngeal Swab  Result Value Ref Range Status   SARS Coronavirus 2 NEGATIVE NEGATIVE Final    Comment: (NOTE) SARS-CoV-2 target nucleic acids are NOT DETECTED.  The SARS-CoV-2 RNA is generally detectable in upper and lower respiratory specimens during the acute phase of infection. Negative results do not preclude SARS-CoV-2 infection, do not rule out co-infections with other pathogens, and should not be used as the sole basis for treatment or other  patient management decisions. Negative results must be combined with clinical observations, patient history, and epidemiological information. The expected result is Negative.  Fact Sheet for Patients: SugarRoll.be  Fact Sheet for Healthcare Providers: https://www.woods-mathews.com/  This test is not yet approved or cleared by the [place] FDA and  has been authorized for detection and/or diagnosis of SARS-CoV-2 by FDA under an Emergency Use Authorization (EUA). This EUA will remain  in effect (meaning this test can be used) for the duration of the COVID-19 declaration under Se ction 564(b)(1) of the Act, 21 U.S.C. section 360bbb-3(b)(1), unless the authorization is terminated or revoked sooner.  Performed at [facility], [place] [address] [place], [place] [address]      Radiology Studies: CT CHEST ABDOMEN PELVIS WO CONTRAST  Result Date: 02/04/2021 CLINICAL DATA:  Anorexia. Jaundice. Weight loss rule out underlying malignancy. EXAM: CT CHEST, ABDOMEN AND PELVIS WITHOUT CONTRAST TECHNIQUE: Multidetector CT imaging of the chest, abdomen and pelvis was performed following the standard protocol without IV contrast. COMPARISON:  CTA of the abdomen and pelvis on 08/04/2015 FINDINGS: CT CHEST FINDINGS Cardiovascular: Heart size is normal. There is coronary artery calcification. Trace pericardial effusion. Pulmonary artery is UPPER limits normal. There is atherosclerotic calcification of the thoracic aorta, not associated with aneurysm. No displaced intimal calcifications. Mediastinum/Nodes: The visualized portion of the thyroid gland has a normal appearance. Esophagus is unremarkable. No significant mediastinal, hilar, or axillary adenopathy. Moderate hiatal hernia. Lungs/Pleura: Trace bilateral pleural effusions. Lungs are clear. Airways are patent. Musculoskeletal: Mild degenerative changes in the thoracic spine. There is mild anterior wedge  deformity of T6, T7, and T8. Schmorl's nodes are present in the thoracic and lumbar spine. CT ABDOMEN PELVIS FINDINGS Hepatobiliary: Gallbladder is distended without CT evidence for stones. The liver is homogeneous. Pancreas: Unremarkable. No pancreatic ductal dilatation or surrounding inflammatory changes. Spleen: Normal in size without focal abnormality. Adrenals/Urinary Tract: Adrenal glands are normal. RIGHT kidney is unremarkable. RIGHT ureter is normal in appearance. LEFT kidney has decreased in size since previous CT in 2016. The etiology is likely dense atherosclerosis of the LEFT renal artery. No focal renal mass or hydronephrosis. No intrarenal or ureteral calculi. The bladder and visualized portion of the urethra are normal. Stomach/Bowel: Moderate hiatal hernia. Stomach is otherwise normal in appearance. Small bowel loops are normal in appearance. The appendix is well seen and has a normal appearance. Moderate stool burden. Numerous colonic diverticula without acute diverticulitis. Vascular/Lymphatic: There is dense atherosclerotic calcification of the abdominal aorta not associated with aneurysm. Although involved by atherosclerosis, there is vascular opacification of the celiac axis, superior mesenteric artery, and inferior mesenteric artery. No retroperitoneal or mesenteric adenopathy. Reproductive: Small, partially calcified uterus.  No adnexal mass. Other: No ascites.  Anterior abdominal wall is unremarkable. Musculoskeletal: No acute abnormality. IMPRESSION: 1. No evidence for acute abnormality of the chest, abdomen, or pelvis. 2. Coronary artery disease. 3. Moderate hiatal hernia. 4. Trace bilateral pleural effusions. 5. Colonic diverticulosis without evidence for acute diverticulitis. 6. Moderate stool burden. 7. Normal appendix. 8. Small LEFT kidney, likely due to severe atherosclerotic disease of the LEFT renal artery. 9. Small, partially calcified uterus. 10. Mild anterior wedge compression  fractures of T6, T7, and T8. 11. Aortic Atherosclerosis (ICD10-I70.0). Electronically Signed   By: Nolon Nations M.D.   On: 02/04/2021 20:36    Scheduled Meds: . cloNIDine  0.1 mg Oral TID  . diltiazem  360 mg Oral QHS  . ezetimibe  10 mg Oral Daily  . feeding supplement  237 mL Oral TID BM  . guanFACINE  1 mg Oral Daily  . heparin  5,000 Units  Subcutaneous Q8H  . hydrALAZINE  100 mg Oral TID  . irbesartan  300 mg Oral Daily  . loteprednol  1 drop Both Eyes Daily  . metoprolol tartrate  12.5 mg Oral BID  . mirtazapine  7.5 mg Oral QHS  . mometasone-formoterol  2 puff Inhalation BID  . multivitamin with minerals  1 tablet Oral Daily  . Netarsudil Dimesylate  1 drop Both Eyes QPM  . pantoprazole  40 mg Oral Daily  . senna-docusate  1 tablet Oral BID   Continuous Infusions: . sodium chloride 100 mL/hr at 02/04/21 0807     LOS: 3 days   Time spent: 35 mins   Shabana Armentrout Wynetta Emery, MD How to contact the [facility] Attending or Consulting provider [place] or covering provider during after hours [place], for this patient?  1. Check the care team in [facility] and look for a) attending/consulting TRH provider listed and b) the [facility] team listed 2. Log into [email or web address] and use [place]'s universal password to access. If you do not have the password, please contact the hospital operator. 3. Locate the [facility] provider you are looking for under [facility] and page to a number that you can be directly reached. 4. If you still have difficulty reaching the provider, please page the [facility] (Director on Call) for the Hospitalists listed on amion for assistance.  02/05/2021, 10:46 AM

## 2021-02-05 NOTE — Progress Notes (Signed)
Pt. Able to make needs known. Provided PRN (ZOFRAN) injection 4 mg. Effective. Pt resting.

## 2021-02-06 DIAGNOSIS — E878 Other disorders of electrolyte and fluid balance, not elsewhere classified: Secondary | ICD-10-CM | POA: Diagnosis not present

## 2021-02-06 DIAGNOSIS — G9341 Metabolic encephalopathy: Secondary | ICD-10-CM | POA: Diagnosis not present

## 2021-02-06 DIAGNOSIS — E871 Hypo-osmolality and hyponatremia: Secondary | ICD-10-CM | POA: Diagnosis not present

## 2021-02-06 DIAGNOSIS — I169 Hypertensive crisis, unspecified: Secondary | ICD-10-CM | POA: Diagnosis not present

## 2021-02-06 LAB — BASIC METABOLIC PANEL
Anion gap: 8 (ref 5–15)
BUN: 13 mg/dL (ref 8–23)
CO2: 23 mmol/L (ref 22–32)
Calcium: 8.7 mg/dL — ABNORMAL LOW (ref 8.9–10.3)
Chloride: 95 mmol/L — ABNORMAL LOW (ref 98–111)
Creatinine, Ser: 0.94 mg/dL (ref 0.44–1.00)
GFR, Estimated: >60 mL/min (ref >60)
Glucose, Bld: 101 mg/dL — ABNORMAL HIGH (ref 70–99)
Potassium: 4.1 mmol/L (ref 3.5–5.1)
Sodium: 126 mmol/L — ABNORMAL LOW (ref 135–145)

## 2021-02-06 NOTE — Progress Notes (Signed)
PROGRESS NOTE   Stephanie West  QPR:[id number] DOB: [birth year] DOA: 02/02/2021 PCP: Celene Squibb, MD   Chief Complaint  Patient presents with  . Abnormal Lab   Level of care: Telemetry  Brief Admission History:  80 y.o. female, with history of mitral valve prolapse, hypertension, GERD, asthma, and more presents to the ED with a chief complaint of low sodium.  Patient had presented to her PCP for bilateral arm tremors, generalized weakness, fatigue blood work was done that showed a sodium of 117.  Patient usually runs a slightly low between 126 and 132, but this was low even for her.  Unfortunately patient is not able to give much history because she is confused.  Daughter, [name], is at bedside and reports that patient has not been eating for last 3 weeks mostly due to ongoing depression from loss of husband 2 years ago.    Assessment & Plan:   Principal Problem:   Acute hyponatremia Active Problems:   Hypertensive crisis   Hypochloremia   Anxiety   Acute metabolic encephalopathy   Malnutrition of moderate degree   1. Severe hyponatremia - Na up to 126, clinically feeling better, presumably this is from poor oral intake, continue IV fluid with normal saline infusion per renal team recs.  Appreciate nephrology consultation and recommendations.  Continue to follow the BMP.  2. Metabolic encephalopathy - RESOLVED.  Mentation improved.   3. Hypertensive urgency - BP has been difficult to control.  Resumed all home BP meds and BP slowly better controlled but remains suboptimal.  4. Severe depression -since loss of husband, when medically cleared will ask for TTS evaluation. She may need inpatient behavioral health treatment.  5. Anorexia - spoke with GI, trial of remeron QHS started.  CT chest abd pelvis to evaluate for underlying malignancy was negative for any acute findings.  6. GERD - remains on protonix for GI protection.  7. Meningioma - she was scheduled to see neurosurgery  outpatient on Monday 4/18, I asked daughter to reschedule as she likely will still be in hospital.    DVT prophylaxis: SCD  Code Status: full  Family Communication: daughter updated telephone 4/16 Disposition: home with home health Status is: Inpatient  Remains inpatient appropriate because:Persistent severe electrolyte disturbances and Inpatient level of care appropriate due to severity of illness  Dispo: The patient is from: Home              Anticipated d/c is to: TBD              Patient currently is not medically stable to d/c.   Difficult to place patient No  Consultants:   Nephrology   Procedures:   N/a   Antimicrobials:  N/a    Subjective: Pt reports feeling better today.    Objective: Vitals:   02/05/21 2144 02/05/21 2148 02/06/21 0619 02/06/21 0733  BP: (!) 130/57  (!) 168/72   Pulse:  73 63   Resp:   16   Temp:   98.3 F (36.8 C)   TempSrc:   Oral   SpO2:   95% 92%  Weight:      Height:        Intake/Output Summary (Last 24 hours) at 02/06/2021 1220 Last data filed at 02/06/2021 0900 Gross per 24 hour  Intake 480 ml  Output 400 ml  Net 80 ml   Filed Weights   02/02/21 2312 02/04/21 0545 02/04/21 1206  Weight: 50.9 kg 53.9 kg 51.3 kg  Examination:  General exam: frail, elderly female, Appears calm and comfortable  Respiratory system: Clear to auscultation. Respiratory effort normal. Cardiovascular system: normal S1 & S2 heard. No JVD, murmurs, rubs, gallops or clicks. No pedal edema. Gastrointestinal system: Abdomen is nondistended, soft and nontender. No organomegaly or masses felt. Normal bowel sounds heard. Central nervous system: Alert and oriented. No focal neurological deficits. Extremities: Symmetric 5 x 5 power. Skin: No rashes, lesions or ulcers Psychiatry: Judgement and insight appear poor. Mood & affect depressed.   Data Reviewed: I have personally reviewed following labs and imaging studies  CBC: Recent Labs  Lab  02/02/21 1757 02/03/21 0229  WBC 4.4 3.0*  NEUTROABS 2.2  --   HGB 13.8 11.8*  HCT 39.0 33.5*  MCV 90.5 91.0  PLT 287 235    Basic Metabolic Panel: Recent Labs  Lab 02/02/21 1757 02/03/21 0229 02/03/21 0953 02/03/21 1113 02/04/21 0403 02/04/21 0935 02/04/21 1606 02/05/21 0554 02/06/21 0524  NA 117* 118* 120*   < > 124*  123* 124* 122* 123* 126*  K 4.1 3.7 3.3*  --  4.4  --   --  3.9 4.1  CL 84* 88* 88*  --  95*  --   --  92* 95*  CO2 22 22 23   --  22  --   --  24 23  GLUCOSE 123* 100* 158*  --  99  --   --  103* 101*  BUN 14 11 10   --  11  --   --  9 13  CREATININE 0.98 0.82 0.88  --  0.91  --   --  0.82 0.94  CALCIUM 9.2 8.1* 8.7*  --  8.0*  --   --  8.1* 8.7*  MG 1.9 1.6*  --   --  2.3  --   --   --   --   PHOS 3.7  --  2.3*  --   --   --   --   --   --    < > = values in this interval not displayed.    GFR: Estimated Creatinine Clearance: 37.4 mL/min (by C-G formula based on SCr of 0.94 mg/dL).  Liver Function Tests: Recent Labs  Lab 02/02/21 1757 02/03/21 0953  AST 22  --   ALT 14  --   ALKPHOS 65  --   BILITOT 0.7  --   PROT 7.5  --   ALBUMIN 4.2 3.5    CBG: No results for input(s): GLUCAP in the last 168 hours.  Recent Results (from the past 240 hour(s))  SARS CORONAVIRUS 2 (TAT 6-24 HRS) Nasopharyngeal Nasopharyngeal Swab     Status: None   Collection Time: 02/02/21  9:14 PM   Specimen: Nasopharyngeal Swab  Result Value Ref Range Status   SARS Coronavirus 2 NEGATIVE NEGATIVE Final    Comment: (NOTE) SARS-CoV-2 target nucleic acids are NOT DETECTED.  The SARS-CoV-2 RNA is generally detectable in upper and lower respiratory specimens during the acute phase of infection. Negative results do not preclude SARS-CoV-2 infection, do not rule out co-infections with other pathogens, and should not be used as the sole basis for treatment or other patient management decisions. Negative results must be combined with clinical observations, patient  history, and epidemiological information. The expected result is Negative.  Fact Sheet for Patients: SugarRoll.be  Fact Sheet for Healthcare Providers: https://www.woods-mathews.com/  This test is not yet approved or cleared by the [place] and  has been authorized  for detection and/or diagnosis of SARS-CoV-2 by FDA under an Emergency Use Authorization (EUA). This EUA will remain  in effect (meaning this test can be used) for the duration of the COVID-19 declaration under Se ction 564(b)(1) of the Act, 21 U.S.C. section 360bbb-3(b)(1), unless the authorization is terminated or revoked sooner.  Performed at [facility], [place] [address], [place], [place] [address]      Radiology Studies: CT CHEST ABDOMEN PELVIS WO CONTRAST  Result Date: 02/04/2021 CLINICAL DATA:  Anorexia. Jaundice. Weight loss rule out underlying malignancy. EXAM: CT CHEST, ABDOMEN AND PELVIS WITHOUT CONTRAST TECHNIQUE: Multidetector CT imaging of the chest, abdomen and pelvis was performed following the standard protocol without IV contrast. COMPARISON:  CTA of the abdomen and pelvis on 08/04/2015 FINDINGS: CT CHEST FINDINGS Cardiovascular: Heart size is normal. There is coronary artery calcification. Trace pericardial effusion. Pulmonary artery is UPPER limits normal. There is atherosclerotic calcification of the thoracic aorta, not associated with aneurysm. No displaced intimal calcifications. Mediastinum/Nodes: The visualized portion of the thyroid gland has a normal appearance. Esophagus is unremarkable. No significant mediastinal, hilar, or axillary adenopathy. Moderate hiatal hernia. Lungs/Pleura: Trace bilateral pleural effusions. Lungs are clear. Airways are patent. Musculoskeletal: Mild degenerative changes in the thoracic spine. There is mild anterior wedge deformity of T6, T7, and T8. Schmorl's nodes are present in the thoracic and lumbar spine. CT ABDOMEN  PELVIS FINDINGS Hepatobiliary: Gallbladder is distended without CT evidence for stones. The liver is homogeneous. Pancreas: Unremarkable. No pancreatic ductal dilatation or surrounding inflammatory changes. Spleen: Normal in size without focal abnormality. Adrenals/Urinary Tract: Adrenal glands are normal. RIGHT kidney is unremarkable. RIGHT ureter is normal in appearance. LEFT kidney has decreased in size since previous CT in 2016. The etiology is likely dense atherosclerosis of the LEFT renal artery. No focal renal mass or hydronephrosis. No intrarenal or ureteral calculi. The bladder and visualized portion of the urethra are normal. Stomach/Bowel: Moderate hiatal hernia. Stomach is otherwise normal in appearance. Small bowel loops are normal in appearance. The appendix is well seen and has a normal appearance. Moderate stool burden. Numerous colonic diverticula without acute diverticulitis. Vascular/Lymphatic: There is dense atherosclerotic calcification of the abdominal aorta not associated with aneurysm. Although involved by atherosclerosis, there is vascular opacification of the celiac axis, superior mesenteric artery, and inferior mesenteric artery. No retroperitoneal or mesenteric adenopathy. Reproductive: Small, partially calcified uterus.  No adnexal mass. Other: No ascites.  Anterior abdominal wall is unremarkable. Musculoskeletal: No acute abnormality. IMPRESSION: 1. No evidence for acute abnormality of the chest, abdomen, or pelvis. 2. Coronary artery disease. 3. Moderate hiatal hernia. 4. Trace bilateral pleural effusions. 5. Colonic diverticulosis without evidence for acute diverticulitis. 6. Moderate stool burden. 7. Normal appendix. 8. Small LEFT kidney, likely due to severe atherosclerotic disease of the LEFT renal artery. 9. Small, partially calcified uterus. 10. Mild anterior wedge compression fractures of T6, T7, and T8. 11. Aortic Atherosclerosis (ICD10-I70.0). Electronically Signed   By:  Nolon Nations M.D.   On: 02/04/2021 20:36    Scheduled Meds: . cloNIDine  0.1 mg Oral TID  . diltiazem  360 mg Oral QHS  . ezetimibe  10 mg Oral Daily  . feeding supplement  237 mL Oral TID BM  . guanFACINE  1 mg Oral Daily  . heparin  5,000 Units Subcutaneous Q8H  . hydrALAZINE  100 mg Oral TID  . irbesartan  300 mg Oral Daily  . loteprednol  1 drop Both Eyes Daily  . metoprolol tartrate  12.5 mg Oral BID  . mirtazapine  7.5 mg Oral QHS  . mometasone-formoterol  2 puff Inhalation BID  . multivitamin with minerals  1 tablet Oral Daily  . Netarsudil Dimesylate  1 drop Both Eyes QPM  . pantoprazole  40 mg Oral Daily  . senna-docusate  1 tablet Oral BID   Continuous Infusions: . sodium chloride 100 mL/hr at 02/06/21 0505     LOS: 4 days   Time spent: 35 mins   Elika Godar Wynetta Emery, MD How to contact the [facility] Attending or Consulting provider [place] or covering provider during after hours [place], for this patient?  1. Check the care team in [facility] and look for a) attending/consulting TRH provider listed and b) the [facility] team listed 2. Log into [email or web address] and use [place]'s universal password to access. If you do not have the password, please contact the hospital operator. 3. Locate the [facility] provider you are looking for under [facility] and page to a number that you can be directly reached. 4. If you still have difficulty reaching the provider, please page the [facility] (Director on Call) for the Hospitalists listed on amion for assistance.  02/06/2021, 12:20 PM

## 2021-02-07 ENCOUNTER — Encounter (HOSPITAL_COMMUNITY): Payer: Self-pay | Admitting: Family Medicine

## 2021-02-07 DIAGNOSIS — Z515 Encounter for palliative care: Secondary | ICD-10-CM | POA: Diagnosis not present

## 2021-02-07 DIAGNOSIS — G9341 Metabolic encephalopathy: Secondary | ICD-10-CM | POA: Diagnosis not present

## 2021-02-07 DIAGNOSIS — Z7189 Other specified counseling: Secondary | ICD-10-CM

## 2021-02-07 DIAGNOSIS — R630 Anorexia: Secondary | ICD-10-CM

## 2021-02-07 DIAGNOSIS — I169 Hypertensive crisis, unspecified: Secondary | ICD-10-CM | POA: Diagnosis not present

## 2021-02-07 DIAGNOSIS — E871 Hypo-osmolality and hyponatremia: Secondary | ICD-10-CM | POA: Diagnosis not present

## 2021-02-07 DIAGNOSIS — E440 Moderate protein-calorie malnutrition: Secondary | ICD-10-CM

## 2021-02-07 LAB — BASIC METABOLIC PANEL
Anion gap: 8 (ref 5–15)
BUN: 8 mg/dL (ref 8–23)
CO2: 24 mmol/L (ref 22–32)
Calcium: 8.4 mg/dL — ABNORMAL LOW (ref 8.9–10.3)
Chloride: 92 mmol/L — ABNORMAL LOW (ref 98–111)
Creatinine, Ser: 0.81 mg/dL (ref 0.44–1.00)
GFR, Estimated: >60 mL/min (ref >60)
Glucose, Bld: 102 mg/dL — ABNORMAL HIGH (ref 70–99)
Potassium: 3.6 mmol/L (ref 3.5–5.1)
Sodium: 124 mmol/L — ABNORMAL LOW (ref 135–145)

## 2021-02-07 MED ORDER — FUROSEMIDE 20 MG PO TABS
20.0000 mg | ORAL_TABLET | Freq: Every day | ORAL | Status: DC
  Administered 2021-02-07: 20 mg via ORAL
  Filled 2021-02-07: qty 1

## 2021-02-07 MED ORDER — METOPROLOL TARTRATE 25 MG PO TABS
25.0000 mg | ORAL_TABLET | Freq: Two times a day (BID) | ORAL | Status: DC
  Administered 2021-02-07 – 2021-02-11 (×8): 25 mg via ORAL
  Filled 2021-02-07 (×9): qty 1

## 2021-02-07 MED ORDER — LOTEPREDNOL ETABONATE 0.5 % OP SUSP
1.0000 [drp] | Freq: Every day | OPHTHALMIC | Status: DC
  Administered 2021-02-07 – 2021-02-10 (×4): 1 [drp] via OPHTHALMIC
  Filled 2021-02-07: qty 5

## 2021-02-07 NOTE — Plan of Care (Signed)

## 2021-02-07 NOTE — Progress Notes (Signed)
Patient ID: [place], female   DOB: 1940-11-13, 80 y.o.   MRN: [id number] S: Says she feels " rough" today-  Is tired and confused.  Only knows where she is because it is written on the white board Sodium peaked at 126-  Down to 124 today    O:BP (!) 170/63 (BP Location: Left Arm)   Pulse 71   Temp 98.2 F (36.8 C) (Oral)   Resp 16   Ht 5\' 4"  (1.626 m)   Wt 52.9 kg   SpO2 92%   BMI 20.02 kg/m   Intake/Output Summary (Last 24 hours) at 02/07/2021 0849 Last data filed at 02/07/2021 0600 Gross per 24 hour  Intake 7101.71 ml  Output 850 ml  Net 6251.71 ml   Intake/Output: I/O last 3 completed shifts: In: 7101.7 [P.O.:480; I.V.:6621.7] Out: 850 [Urine:850]  Intake/Output this shift:  No intake/output data recorded. Weight change:  Gen: NAD CVS: RRR Resp: cta Abd: +BS, soft, NT/ND Ext: mild edema  Recent Labs  Lab 02/02/21 1757 02/03/21 0229 02/03/21 0953 02/03/21 1113 02/04/21 0129 02/04/21 0403 02/04/21 0935 02/04/21 1606 02/05/21 0554 02/06/21 0524 02/07/21 0629  NA 117* 118* 120*   < > 122* 124*  123* 124* 122* 123* 126* 124*  K 4.1 3.7 3.3*  --   --  4.4  --   --  3.9 4.1 3.6  CL 84* 88* 88*  --   --  95*  --   --  92* 95* 92*  CO2 22 22 23   --   --  22  --   --  24 23 24   GLUCOSE 123* 100* 158*  --   --  99  --   --  103* 101* 102*  BUN 14 11 10   --   --  11  --   --  9 13 8   CREATININE 0.98 0.82 0.88  --   --  0.91  --   --  0.82 0.94 0.81  ALBUMIN 4.2  --  3.5  --   --   --   --   --   --   --   --   CALCIUM 9.2 8.1* 8.7*  --   --  8.0*  --   --  8.1* 8.7* 8.4*  PHOS 3.7  --  2.3*  --   --   --   --   --   --   --   --   AST 22  --   --   --   --   --   --   --   --   --   --   ALT 14  --   --   --   --   --   --   --   --   --   --    < > = values in this interval not displayed.   Liver Function Tests: Recent Labs  Lab 02/02/21 1757 02/03/21 0953  AST 22  --   ALT 14  --   ALKPHOS 65  --   BILITOT 0.7  --   PROT 7.5  --   ALBUMIN 4.2 3.5    No results for input(s): LIPASE, AMYLASE in the last 168 hours. No results for input(s): AMMONIA in the last 168 hours. CBC: Recent Labs  Lab 02/02/21 1757 02/03/21 0229  WBC 4.4 3.0*  NEUTROABS 2.2  --   HGB 13.8 11.8*  HCT 39.0 33.5*  MCV 90.5 91.0  PLT 287 223   Cardiac Enzymes: No results for input(s): CKTOTAL, CKMB, CKMBINDEX, TROPONINI in the last 168 hours. CBG: No results for input(s): GLUCAP in the last 168 hours.  Iron Studies: No results for input(s): IRON, TIBC, TRANSFERRIN, FERRITIN in the last 72 hours. Studies/Results: No results found. . cloNIDine  0.1 mg Oral TID  . diltiazem  360 mg Oral QHS  . ezetimibe  10 mg Oral Daily  . feeding supplement  237 mL Oral TID BM  . guanFACINE  1 mg Oral Daily  . heparin  5,000 Units Subcutaneous Q8H  . hydrALAZINE  100 mg Oral TID  . irbesartan  300 mg Oral Daily  . loteprednol  1 drop Both Eyes Daily  . metoprolol tartrate  12.5 mg Oral BID  . mirtazapine  7.5 mg Oral QHS  . mometasone-formoterol  2 puff Inhalation BID  . multivitamin with minerals  1 tablet Oral Daily  . Netarsudil Dimesylate  1 drop Both Eyes QPM  . pantoprazole  40 mg Oral Daily  . senna-docusate  1 tablet Oral BID    BMET    Component Value Date/Time   NA 124 (L) 02/07/2021 0629   K 3.6 02/07/2021 0629   CL 92 (L) 02/07/2021 0629   CO2 24 02/07/2021 0629   GLUCOSE 102 (H) 02/07/2021 0629   BUN 8 02/07/2021 0629   CREATININE 0.81 02/07/2021 0629   CREATININE 1.13 (H) 06/30/2020 0925   CALCIUM 8.4 (L) 02/07/2021 0629   GFRNONAA >60 02/07/2021 0629   GFRNONAA 42 (L) 06/11/2020 1324   GFRAA 49 (L) 06/11/2020 1324   CBC    Component Value Date/Time   WBC 3.0 (L) 02/03/2021 0229   RBC 3.68 (L) 02/03/2021 0229   HGB 11.8 (L) 02/03/2021 0229   HCT 33.5 (L) 02/03/2021 0229   PLT 223 02/03/2021 0229   MCV 91.0 02/03/2021 0229   MCH 32.1 02/03/2021 0229   MCHC 35.2 02/03/2021 0229   RDW 11.9 02/03/2021 0229   LYMPHSABS 1.3  02/02/2021 1757   MONOABS 0.7 02/02/2021 1757   EOSABS 0.1 02/02/2021 1757   BASOSABS 0.1 02/02/2021 1757    Assessment/Plan: 1.  Hyponatremia - presumably due to volume depletion given poor po intake for the past 3 weeks.   Agree with initial assessment as the etiology and treatment plan.  Initially improved but now on the way down. Sodium levels did initally improve with NS.  In any event a urine osm of over 400 in the setting of hyponatremia also indicates SIADH.  I will stop the IVF as she has some edema and start lasix which should help to dilute her urine 2. This will be a recurring issue unless her anorexia and aversion to eating/drinking is addressed.   GI evaluation- has been started on remeron .  1. HTN - not sure of her home regimen.  Currently on clonidine 0.1 mg tid, dilt CD 360 mg daily and hydralazine 100 mg tid, metoprolol 12.5 mg bid, and avapro 300 mg daily.  BP variable ,  Orthostatics do not indicate volume depletion.   2. FTT - Pt has presented to ED 5 times in the last 2 months.  Has ongoing anorexia and hyponatremia.  Will need to see GI for her anorexia but may also benefit from Palliative care consult since she is a full code.  3. Disposition - primary is discussing inpatient behavioral health.  Certainly would not want to send home with sodium trending  down-  This is notoriously difficult and slow to treat   Horatio [phone]

## 2021-02-07 NOTE — Consult Note (Signed)
Consultation Note Date: 02/07/2021   Patient Name: Stephanie [place]  DOB: 1940-11-19  MRN: [id number]  Age / Sex: 80 y.o., female  PCP: Stephanie Squibb, MD Referring Physician: Murlean Iba, MD  Reason for Consultation: Establishing goals of care and Psychosocial/spiritual support  HPI/Patient Profile: 80 y.o. female  with past medical history of mitral valve prolapse, HTN, GERD, asthma, esophageal dilation 2016, admitted on 02/02/2021 with acute hyponatremia,.   Clinical Assessment and Goals of Care:  I have reviewed medical records including EPIC notes, labs and imaging, received report from bedside nursing staff, examined the patient and met at bedside with [place] to discuss diagnosis prognosis, GOC, EOL wishes, disposition and options.   I introduced Palliative Medicine as specialized medical care for people living with serious illness. It focuses on providing relief from the symptoms and stress of a serious illness.   We discussed a brief life review of the patient.  Stephanie [place] husband died about 2 years ago.  She has 4 children.  She is independent with ADLs and IADLs, including driving. As far as functional and nutritional status, she is able to get her own groceries, but has endorsed a poor appetite for the past few months.  She states this is due to depression over her husband's death.  We discussed her current illness and what it means in the larger context of her on-going co-morbidities.  Natural disease trajectory and expectations at EOL were discussed.  I shared that we must eat to live, and she agrees.  Stephanie [place] shares that she does want to live, but endorses that she is depressed.  We talked about inpatient Gero-psych.  She tells me that she is agreeable to seek inpatient treatment if needed to treat her depression.  Advanced directives, concepts specific to code status, artifical feeding  and hydration, and rehospitalization were considered and discussed.  Somewhat limited discussion today.  Stephanie West endorses full scope/full code.  Would benefit from continued discussions with daughter [place] present.  She agrees.  Palliative Care services outpatient were explained and offered, Stephanie [place] would benefit from outpatient palliative services to follow.  Questions and concerns were addressed.  The family was encouraged to call with questions or concerns.   Conference with attending, bedside nursing staff, transition of care team related to patient condition, needs, goals of care.   HCPOA    NEXT OF KIN -Stephanie West names her daughter, [place], as her healthcare surrogate.  Her husband died about 2 years ago.  She has 3 other children, [place] who lives in [place], [place] who lives in [place], and [place] who lives in [place].    SUMMARY OF RECOMMENDATIONS   At this point continue to treat the treatable Outpatient palliative services to follow. Agreeable to inpatient Gero-psych if qualified.   Code Status/Advance Care Planning:  Full code  Symptom Management:   Per hospitalist, no additional needs at this time.  Palliative Prophylaxis:   Frequent Pain Assessment and Turn Reposition  Additional Recommendations (Limitations, Scope, Preferences):  Full  Scope Treatment  Psycho-social/Spiritual:   Desire for further Chaplaincy support:no  Additional Recommendations: Caregiving  Support/Resources  Prognosis:   Unable to determine, based on outcomes.  6 months or more would not be surprising based on current functional status and chronic illness burden.  Discharge Planning: To be determined, anticipating home with home health versus inpatient Geri psych if qualified.      Primary Diagnoses: Present on Admission: . Acute hyponatremia . Hypochloremia . Hypertensive crisis . Anxiety   I have reviewed the medical record, interviewed the patient and family, and  examined the patient. The following aspects are pertinent.  Past Medical History:  Diagnosis Date  . Asthma   . Dysphagia 05/10/2017  . Elevated transaminase level   . GERD (gastroesophageal reflux disease)   . Hypertension   . MVP (mitral valve prolapse)    Social History   Socioeconomic History  . Marital status: Widowed    Spouse name: Not on file  . Number of children: Not on file  . Years of education: Not on file  . Highest education level: Not on file  Occupational History  . Not on file  Tobacco Use  . Smoking status: Never Smoker  . Smokeless tobacco: Never Used  Vaping Use  . Vaping Use: Never used  Substance and Sexual Activity  . Alcohol use: No    Alcohol/week: 0.0 standard drinks  . Drug use: No  . Sexual activity: Not on file  Other Topics Concern  . Not on file  Social History Narrative  . Not on file   Social Determinants of Health   Financial Resource Strain: Not on file  Food Insecurity: Not on file  Transportation Needs: Not on file  Physical Activity: Not on file  Stress: Not on file  Social Connections: Not on file   Family History  Problem Relation Age of Onset  . Hypertension Mother   . Prostate cancer Father    Scheduled Meds: . cloNIDine  0.1 mg Oral TID  . diltiazem  360 mg Oral QHS  . ezetimibe  10 mg Oral Daily  . feeding supplement  237 mL Oral TID BM  . furosemide  20 mg Oral Daily  . guanFACINE  1 mg Oral Daily  . heparin  5,000 Units Subcutaneous Q8H  . hydrALAZINE  100 mg Oral TID  . irbesartan  300 mg Oral Daily  . loteprednol  1 drop Both Eyes Daily  . metoprolol tartrate  25 mg Oral BID  . mirtazapine  7.5 mg Oral QHS  . mometasone-formoterol  2 puff Inhalation BID  . multivitamin with minerals  1 tablet Oral Daily  . Netarsudil Dimesylate  1 drop Both Eyes QPM  . pantoprazole  40 mg Oral Daily  . senna-docusate  1 tablet Oral BID   Continuous Infusions: PRN Meds:.acetaminophen **OR** acetaminophen, albuterol,  ALPRAZolam, ondansetron **OR** ondansetron (ZOFRAN) IV, traMADol Medications Prior to Admission:  Prior to Admission medications   Medication Sig Start Date End Date Taking? Authorizing Provider  albuterol (PROVENTIL HFA;VENTOLIN HFA) 108 (90 BASE) MCG/ACT inhaler Inhale 2 puffs into the lungs every 6 (six) hours as needed for wheezing or shortness of breath.   Yes [provider]  ALPRAZolam (XANAX) 0.25 MG tablet Take 1 tablet by mouth 2 (two) times daily as needed for anxiety. 04/30/19  Yes [provider]  cloNIDine (CATAPRES) 0.1 MG tablet Take 1 tablet (0.1 mg total) by mouth 3 (three) times daily. 06/08/20  Yes Branch, Alphonse Guild, MD  diltiazem (CARDIZEM CD) 360 MG 24 hr capsule Take 360 mg by mouth daily.   Yes [provider]  esomeprazole (NEXIUM) 40 MG capsule Take 40 mg by mouth daily before breakfast.    Yes [provider]  ezetimibe (ZETIA) 10 MG tablet Take 10 mg by mouth daily.   Yes [provider]  Fluticasone-Salmeterol (ADVAIR) 250-50 MCG/DOSE AEPB Inhale 1 puff into the lungs every 12 (twelve) hours.   Yes [provider]  guanFACINE (INTUNIV) 1 MG TB24 ER tablet Take 1 mg by mouth daily. 04/01/20  Yes [provider]  hydrALAZINE (APRESOLINE) 25 MG tablet Take 1.5 tablets (37.5 mg total) by mouth 3 (three) times daily. Patient taking differently: Take 100 mg by mouth 3 (three) times daily. 11/29/20 02/27/21 Yes Branch, Alphonse Guild, MD  loteprednol (LOTEMAX) 0.2 % SUSP Place 1 drop into both eyes daily.    Yes [provider]  meclizine (ANTIVERT) 25 MG tablet Take 1 tablet (25 mg total) by mouth 3 (three) times daily as needed for dizziness. Patient taking differently: Take 25 mg by mouth daily as needed for dizziness. 01/17/21  Yes Jacqlyn Larsen, PA-C  Netarsudil Dimesylate 0.02 % SOLN Place 1 drop into both eyes every evening.   Yes [provider]  olmesartan (BENICAR) 40 MG tablet Take 1 tablet by  mouth daily. 06/18/19  Yes [provider]  potassium chloride (KLOR-CON) 10 MEQ tablet Take 10 mEq by mouth 3 (three) times daily. 01/14/21  Yes [provider]  ciprofloxacin (CIPRO) 250 MG tablet Take 250 mg by mouth 2 (two) times daily. Starting 3.18.22 x 10 days. Patient not taking: No sig reported 01/07/21   [provider]  ciprofloxacin (CIPRO) 500 MG tablet Take 1 tablet (500 mg total) by mouth 2 (two) times daily. Patient not taking: No sig reported 11/28/20   Faustino Congress, NP  fluocinonide cream (LIDEX) 0.05 %  01/14/21   [provider]  furosemide (LASIX) 20 MG tablet Take 1 tablet (20 mg total) by mouth as needed for edema (swelling). Patient not taking: Reported on 02/02/2021 07/16/20   Arnoldo Lenis, MD  metoprolol tartrate (LOPRESSOR) 25 MG tablet Take 0.5 tablets (12.5 mg total) by mouth 2 (two) times daily. Patient not taking: Reported on 02/02/2021 01/13/21   Arnoldo Lenis, MD  multivitamin-iron-minerals-folic acid (CENTRUM) chewable tablet Chew 1 tablet by mouth daily. Patient not taking: Reported on 02/02/2021    [provider]   Allergies  Allergen Reactions  . Apple Other (See Comments)    Wheezing and Asthma Symptoms   . Aspirin Other (See Comments)    Wheezing, Asthma Symptoms   . Avelox [Moxifloxacin Hcl In Nacl] Hives    Pt can take cipro  . Bee Venom Other (See Comments)    Wheezing, Asthma Symptoms  . Benadryl [Diphenhydramine Hcl] Other (See Comments)    Wheezing, Asthma Symptoms   . Chicken Allergy Other (See Comments)    Wheezing and Asthma Symptoms   . Codeine Other (See Comments)    Makes patient feel like she's going to pass out.   Haze Boyden [Gemifloxacin Mesylate] Hives  . Factive [Gemifloxacin] Hives  . Hctz [Hydrochlorothiazide]     hyponatremia  . Iodinated Diagnostic Agents Other (See Comments)    Pt was told by allergist to avoid  Dye due to other allergies  . Iodine   . Levaquin  [Levofloxacin In D5w] Hives    Pt can take cipro  . Levofloxacin  Hives  . Penicillins Other (See Comments)    Asthma Symptoms .Did it involve swelling of the face/tongue/throat, SOB, or low BP? No Did it involve sudden or severe rash/hives, skin peeling, or any reaction on the inside of your mouth or nose? Yes Did you need to seek medical attention at a hospital or doctor's office? Unknown When did it last happen?Unkown If all above answers are "NO", may proceed with cephalosporin use.   . Sulfa Antibiotics Nausea Only  . Tequin [Gatifloxacin] Hives   Review of Systems  Unable to perform ROS: Age    Physical Exam Vitals and nursing note reviewed.  Constitutional:      General: She is not in acute distress.    Appearance: Normal appearance.  HENT:     Head: Atraumatic.     Mouth/Throat:     Mouth: Mucous membranes are moist.  Cardiovascular:     Rate and Rhythm: Normal rate.  Pulmonary:     Effort: Pulmonary effort is normal. No respiratory distress.  Skin:    General: Skin is warm and dry.  Neurological:     Mental Status: She is alert and oriented to person, place, and time.  Psychiatric:        Mood and Affect: Mood normal.        Behavior: Behavior normal.     Vital Signs: BP (!) 187/63 (BP Location: Left Arm)   Pulse 77   Temp 98.2 F (36.8 C) (Oral)   Resp 18   Ht 5' 4"  (1.626 m)   Wt 52.9 kg   SpO2 91%   BMI 20.02 kg/m  Pain Scale: 0-10   Pain Score: 0-No pain   SpO2: SpO2: 91 % O2 Device:SpO2: 91 % O2 Flow Rate: .   IO: Intake/output summary:   Intake/Output Summary (Last 24 hours) at 02/07/2021 1219 Last data filed at 02/07/2021 0900 Gross per 24 hour  Intake 7101.71 ml  Output 850 ml  Net 6251.71 ml    LBM: Last BM Date: 02/05/21 Baseline Weight: Weight: 50.9 kg Most recent weight: Weight: 52.9 kg     Palliative Assessment/Data:   Flowsheet Rows   Flowsheet Row Most Recent Value  Intake Tab   Referral Department Hospitalist   Unit at Time of Referral Med/Surg Unit  Palliative Care Primary Diagnosis Other (Comment)  Date Notified 02/04/21  Palliative Care Type [place] Palliative care  Reason for referral Clarify Goals of Care  Date of Admission 02/02/21  Date first seen by Palliative Care 02/07/21  # of days Palliative referral response time 3 Day(s)  # of days IP prior to Palliative referral 2  Clinical Assessment   Palliative Performance Scale Score 60%  Pain Max last 24 hours Not able to report  Pain Min Last 24 hours Not able to report  Dyspnea Max Last 24 Hours Not able to report  Dyspnea Min Last 24 hours Not able to report  Psychosocial & Spiritual Assessment   Palliative Care Outcomes       Time In: 0910 Time Out: 1000 Time Total: 50 minutes  Greater than 50%  of this time was spent counseling and coordinating care related to the above assessment and plan.  Signed by: Drue Novel, NP   Please contact Palliative Medicine Team phone at [phone] for questions and concerns.  For individual provider: See Shea Evans

## 2021-02-07 NOTE — Progress Notes (Signed)
PROGRESS NOTE   Stephanie West  DTO:[id number] DOB: 1941-04-30 DOA: 02/02/2021 PCP: Stephanie Squibb, MD   Chief Complaint  Patient presents with  . Abnormal Lab   Level of care: Med-Surg  Brief Admission History:  80 y.o. female, with history of mitral valve prolapse, hypertension, GERD, asthma, and more presents to the ED with a chief complaint of low sodium.  Patient had presented to her PCP for bilateral arm tremors, generalized weakness, fatigue blood work was done that showed a sodium of 117.  Patient usually runs a slightly low between 126 and 132, but this was low even for her.  Unfortunately patient is not able to give much history because she is confused.  Daughter, Stephanie West, is at bedside and reports that patient has not been eating for last 3 weeks mostly due to ongoing depression from loss of husband 2 years ago.    Assessment & Plan:   Principal Problem:   Acute hyponatremia Active Problems:   Hypertensive crisis   Hypochloremia   Anxiety   Acute metabolic encephalopathy   Malnutrition of moderate degree  1. Severe hyponatremia - Na peaked at 126, but now down to 124.  Pt is clinically feeling better.  This hyponatremia is presumably from longterm poor oral intake over several weeks, initially treated with IV fluid with normal saline infusion but today 4/18 IV fluids stopped and lasix started.  Appreciate nephrology consultation and recommendations.  Continue to follow the BMP.  2. Metabolic encephalopathy - RESOLVED.  Mentation improved.   3. Hypertensive urgency - BP has been difficult to control.  Resumed all home BP meds and BP slowly better controlled but remains suboptimal.  4. Severe depression -since loss of husband, when medically cleared will ask for TTS evaluation. If sodium stable to improved tomorrow will try to get TTS eval completed.  She may need inpatient behavioral health treatment.  5. Anorexia - spoke with GI, trial of remeron QHS started.  CT chest abd pelvis  to evaluate for underlying malignancy was negative for any acute findings.  6. GERD - remains on protonix for GI protection.  7. Meningioma - she was scheduled to see neurosurgery outpatient on Monday 4/18, I asked daughter to reschedule as she likely will still be in hospital.    DVT prophylaxis: SCD  Code Status: full  Family Communication: daughter updated telephone 4/16 Disposition: home with home health Status is: Inpatient  Remains inpatient appropriate because:Persistent severe electrolyte disturbances and Inpatient level of care appropriate due to severity of illness  Dispo: The patient is from: Home              Anticipated d/c is to: TBD              Patient currently is not medically stable to d/c.   Difficult to place patient No  Consultants:   Nephrology   Procedures:   N/a   Antimicrobials:  N/a    Subjective: Pt reports eating and drinking better but depression persists.  Denies suicidal ideation.   Objective: Vitals:   02/07/21 0500 02/07/21 0737 02/07/21 1000 02/07/21 1339  BP:   (!) 187/63 (!) 168/68  Pulse:   77 72  Resp:   18 18  Temp:   98.2 F (36.8 C) 98.6 F (37 C)  TempSrc:   Oral Oral  SpO2:  92% 91% 92%  Weight: 52.9 kg     Height:        Intake/Output Summary (Last 24 hours) at  02/07/2021 1549 Last data filed at 02/07/2021 1300 Gross per 24 hour  Intake 7341.71 ml  Output 850 ml  Net 6491.71 ml   Filed Weights   02/04/21 0545 02/04/21 1206 02/07/21 0500  Weight: 53.9 kg 51.3 kg 52.9 kg    Examination:  General exam: frail, elderly female, Appears calm and comfortable  Respiratory system: Clear to auscultation. Respiratory effort normal. Cardiovascular system: normal S1 & S2 heard. No JVD, murmurs, rubs, gallops or clicks. No pedal edema. Gastrointestinal system: Abdomen is nondistended, soft and nontender. No organomegaly or masses felt. Normal bowel sounds heard. Central nervous system: Alert and oriented. No focal  neurological deficits. Extremities: Symmetric 5 x 5 power. Skin: No rashes, lesions or ulcers Psychiatry: Judgement and insight appear poor. Mood & affect depressed.   Data Reviewed: I have personally reviewed following labs and imaging studies  CBC: Recent Labs  Lab 02/02/21 1757 02/03/21 0229  WBC 4.4 3.0*  NEUTROABS 2.2  --   HGB 13.8 11.8*  HCT 39.0 33.5*  MCV 90.5 91.0  PLT 287 793    Basic Metabolic Panel: Recent Labs  Lab 02/02/21 1757 02/03/21 0229 02/03/21 0953 02/03/21 1113 02/04/21 0403 02/04/21 0935 02/04/21 1606 02/05/21 0554 02/06/21 0524 02/07/21 0629  NA 117* 118* 120*   < > 124*  123* 124* 122* 123* 126* 124*  K 4.1 3.7 3.3*  --  4.4  --   --  3.9 4.1 3.6  CL 84* 88* 88*  --  95*  --   --  92* 95* 92*  CO2 22 22 23   --  22  --   --  24 23 24   GLUCOSE 123* 100* 158*  --  99  --   --  103* 101* 102*  BUN 14 11 10   --  11  --   --  9 13 8   CREATININE 0.98 0.82 0.88  --  0.91  --   --  0.82 0.94 0.81  CALCIUM 9.2 8.1* 8.7*  --  8.0*  --   --  8.1* 8.7* 8.4*  MG 1.9 1.6*  --   --  2.3  --   --   --   --   --   PHOS 3.7  --  2.3*  --   --   --   --   --   --   --    < > = values in this interval not displayed.    GFR: Estimated Creatinine Clearance: 44.7 mL/min (by C-G formula based on SCr of 0.81 mg/dL).  Liver Function Tests: Recent Labs  Lab 02/02/21 1757 02/03/21 0953  AST 22  --   ALT 14  --   ALKPHOS 65  --   BILITOT 0.7  --   PROT 7.5  --   ALBUMIN 4.2 3.5    CBG: No results for input(s): GLUCAP in the last 168 hours.  Recent Results (from the past 240 hour(s))  SARS CORONAVIRUS 2 (TAT 6-24 HRS) Nasopharyngeal Nasopharyngeal Swab     Status: None   Collection Time: 02/02/21  9:14 PM   Specimen: Nasopharyngeal Swab  Result Value Ref Range Status   SARS Coronavirus 2 NEGATIVE NEGATIVE Final    Comment: (NOTE) SARS-CoV-2 target nucleic acids are NOT DETECTED.  The SARS-CoV-2 RNA is generally detectable in upper and  lower respiratory specimens during the acute phase of infection. Negative results do not preclude SARS-CoV-2 infection, do not rule out co-infections with other pathogens, and should not  be used as the sole basis for treatment or other patient management decisions. Negative results must be combined with clinical observations, patient history, and epidemiological information. The expected result is Negative.  Fact Sheet for Patients: SugarRoll.be  Fact Sheet for Healthcare Providers: https://www.woods-mathews.com/  This test is not yet approved or cleared by the [place] FDA and  has been authorized for detection and/or diagnosis of SARS-CoV-2 by FDA under an Emergency Use Authorization (EUA). This EUA will remain  in effect (meaning this test can be used) for the duration of the COVID-19 declaration under Se ction 564(b)(1) of the Act, 21 U.S.C. section 360bbb-3(b)(1), unless the authorization is terminated or revoked sooner.  Performed at [facility], [place] [address], [place], [place] [address]      Radiology Studies: No results found.  Scheduled Meds: . cloNIDine  0.1 mg Oral TID  . diltiazem  360 mg Oral QHS  . ezetimibe  10 mg Oral Daily  . feeding supplement  237 mL Oral TID BM  . furosemide  20 mg Oral Daily  . guanFACINE  1 mg Oral Daily  . heparin  5,000 Units Subcutaneous Q8H  . hydrALAZINE  100 mg Oral TID  . irbesartan  300 mg Oral Daily  . loteprednol  1 drop Both Eyes Daily  . metoprolol tartrate  25 mg Oral BID  . mirtazapine  7.5 mg Oral QHS  . mometasone-formoterol  2 puff Inhalation BID  . multivitamin with minerals  1 tablet Oral Daily  . Netarsudil Dimesylate  1 drop Both Eyes QPM  . pantoprazole  40 mg Oral Daily  . senna-docusate  1 tablet Oral BID   Continuous Infusions:    LOS: 5 days   Time spent: 35 mins   Mackinzee Roszak Wynetta Emery, MD How to contact the [facility] Attending or Consulting provider [place] or covering provider during after hours Noonday, for this patient?  1. Check the care team in [facility] and look for a) attending/consulting [facility] provider listed and b) the [facility] team listed 2. Log into [email or web address] and use [place]'s universal password to access. If you do not have the password, please contact the hospital operator. 3. Locate the [facility] provider you are looking for under [facility] and page to a number that you can be directly reached. 4. If you still have difficulty reaching the provider, please page the [facility] (Director on Call) for the Hospitalists listed on amion for assistance.  02/07/2021, 3:49 PM

## 2021-02-07 NOTE — Progress Notes (Signed)
Physical Therapy Treatment Patient Details Name: Stephanie West MRN: [id number] DOB: 1941-01-07 Today's Date: 02/07/2021    History of Present Illness Stephanie West is an 80 y.o. female with a PMH significant for HTN, mitral valve prolapse, GERD, and asthma who was sent from her PCP's office due to low sodium level noticed on labs.  Repeat labs in ED showed Na of 117 and she was admitted for further treatment.  She apparently was very confused yesterday and her daughter had to give information.  She reports that the patient has not been eating or drinking much at all for the past 3 weeks.  She had been to the ED on 01/17/21 with dizziness and started on meclizine.  Her sodium was 128 at that time.  She has a history of low sodium levels as is seen by the trend below.     She is feeling better today and denies any N/V/D but just "can't eat, I'm not hungry".  No SOB, DOE, lower extremity edema.  She does not know what happened or why she is in the hospital.    PT Comments    Patient completes bed mobility slowly but without assist. Patient requires min A/G to transfers to standing with RW and ambulates with RW without loss of balance. She ambulates with slow cadence with c/o slight dizziness and bilateral LE weakness. Patient limited by fatigue with ambulation. Patient completes exercises seated EOB and also completes supine exercises. She is educated on completing while in hospital throughout the day to improve/maintain strength. Patient will benefit from continued physical therapy in hospital and recommended venue below to increase strength, balance, endurance for safe ADLs and gait.    Follow Up Recommendations  Home health PT;Supervision - Intermittent;Supervision for mobility/OOB     Equipment Recommendations  3in1 (PT);Rolling walker with 5" wheels    Recommendations for Other Services       Precautions / Restrictions Precautions Precautions: Fall Restrictions Weight Bearing  Restrictions: No    Mobility  Bed Mobility Overal bed mobility: Modified Independent             General bed mobility comments: slightly slow, labored    Transfers Overall transfer level: Needs assistance Equipment used: Rolling walker (2 wheeled) Transfers: Sit to/from [facility] Sit to Stand: Min assist;Min guard Stand pivot transfers: Min assist;Min guard       General transfer comment: requires min assist and use of RW to transfer to standing  Ambulation/Gait Ambulation/Gait assistance: Min guard Gait Distance (Feet): 120 Feet Assistive device: Rolling walker (2 wheeled) Gait Pattern/deviations: Step-through pattern;Decreased step length - right;Decreased step length - left;Decreased stride length Gait velocity: decreased   General Gait Details: slow, labored cadence with RW without loss of balance, c/o slight dizziness and LE weakness throughout   Stairs             Wheelchair Mobility    Modified Rankin (Stroke Patients Only)       Balance Overall balance assessment: Needs assistance Sitting-balance support: Feet supported;No upper extremity supported Sitting balance-Leahy Scale: Good Sitting balance - Comments: seated EOB   Standing balance support: During functional activity;No upper extremity supported Standing balance-Leahy Scale: Fair Standing balance comment: good/fair with RW                            Cognition Arousal/Alertness: Awake/alert Behavior During Therapy: WFL for tasks assessed/performed Overall Cognitive Status: Within Functional Limits for tasks assessed  Exercises General Exercises - Lower Extremity Long Arc Quad: AROM;Both;10 reps;Seated Heel Slides: AROM;Both;10 reps;Supine Straight Leg Raises: AROM;Both;10 reps;Supine Hip Flexion/Marching: AROM;Both;10 reps;Seated Toe Raises: AROM;Both;10 reps;Seated Heel Raises: AROM;Both;10  reps;Seated    General Comments        Pertinent Vitals/Pain Pain Assessment: No/denies pain    Home Living                      Prior Function            PT Goals (current goals can now be found in the care plan section) Acute Rehab PT Goals Patient Stated Goal: Return to prior level of function. PT Goal Formulation: With patient Time For Goal Achievement: 02/17/21 Potential to Achieve Goals: Fair Progress towards PT goals: Progressing toward goals    Frequency    Min 3X/week      PT Plan Current plan remains appropriate    Co-evaluation              AM-PAC PT "6 Clicks" Mobility   Outcome Measure  Help needed turning from your back to your side while in a flat bed without using bedrails?: None Help needed moving from lying on your back to sitting on the side of a flat bed without using bedrails?: A Little Help needed moving to and from a bed to a chair (including a wheelchair)?: A Little Help needed standing up from a chair using your arms (e.g., wheelchair or bedside chair)?: A Little Help needed to walk in hospital room?: A Little Help needed climbing 3-5 steps with a railing? : A Little 6 Click Score: 19    End of Session Equipment Utilized During Treatment: Gait belt Activity Tolerance: Patient limited by fatigue Patient left: in bed;with call bell/phone within reach;with bed alarm set Nurse Communication: Mobility status PT Visit Diagnosis: Unsteadiness on feet (R26.81);Other abnormalities of gait and mobility (R26.89);Muscle weakness (generalized) (M62.81)     Time: 8676-7209 PT Time Calculation (min) (ACUTE ONLY): 15 min  Charges:  $Therapeutic Activity: 8-22 mins                     9:46 AM, 02/07/21 Mearl Latin PT, DPT Physical Therapist at [facility]

## 2021-02-07 NOTE — Progress Notes (Signed)
Alert and oriented x 4. Needs 1 person assist with adls. Call bell within reach. IV fluids infused and tolerated well. Compliant with care. Continue plan of care.

## 2021-02-08 DIAGNOSIS — E871 Hypo-osmolality and hyponatremia: Secondary | ICD-10-CM | POA: Diagnosis not present

## 2021-02-08 DIAGNOSIS — G9341 Metabolic encephalopathy: Secondary | ICD-10-CM | POA: Diagnosis not present

## 2021-02-08 DIAGNOSIS — I169 Hypertensive crisis, unspecified: Secondary | ICD-10-CM | POA: Diagnosis not present

## 2021-02-08 DIAGNOSIS — R630 Anorexia: Secondary | ICD-10-CM | POA: Diagnosis not present

## 2021-02-08 LAB — BASIC METABOLIC PANEL
Anion gap: 9 (ref 5–15)
BUN: 10 mg/dL (ref 8–23)
CO2: 27 mmol/L (ref 22–32)
Calcium: 8.5 mg/dL — ABNORMAL LOW (ref 8.9–10.3)
Chloride: 88 mmol/L — ABNORMAL LOW (ref 98–111)
Creatinine, Ser: 0.91 mg/dL (ref 0.44–1.00)
GFR, Estimated: >60 mL/min (ref >60)
Glucose, Bld: 104 mg/dL — ABNORMAL HIGH (ref 70–99)
Potassium: 3.8 mmol/L (ref 3.5–5.1)
Sodium: 124 mmol/L — ABNORMAL LOW (ref 135–145)

## 2021-02-08 MED ORDER — FUROSEMIDE 20 MG PO TABS
20.0000 mg | ORAL_TABLET | Freq: Two times a day (BID) | ORAL | Status: DC
  Administered 2021-02-08 – 2021-02-09 (×2): 20 mg via ORAL
  Filled 2021-02-08 (×2): qty 1

## 2021-02-08 NOTE — Progress Notes (Signed)
Patient ID: RAHAF CARBONELL, female   DOB: 01-05-1941, 80 y.o.   MRN: [id number]  S: Sitting on the side of the bed today -  Looks much brighter-  Sodium stable at 124.   Noted discussion with palliative care-  Wants full scope of care   O:BP (!) 151/64 (BP Location: Right Arm)   Pulse 73   Temp 97.9 F (36.6 C) (Oral)   Resp 18   Ht 5\' 4"  (1.626 m)   Wt 52.2 kg   SpO2 95%   BMI 19.75 kg/m   Intake/Output Summary (Last 24 hours) at 02/08/2021 0817 Last data filed at 02/08/2021 0524 Gross per 24 hour  Intake 960 ml  Output 800 ml  Net 160 ml   Intake/Output: I/O last 3 completed shifts: In: 7581.7 [P.O.:960; I.V.:6621.7] Out: 1650 [Urine:1650]  Intake/Output this shift:  No intake/output data recorded. Weight change: -0.7 kg Gen: NAD CVS: RRR Resp: cta Abd: +BS, soft, NT/ND Ext: mild edema  Recent Labs  Lab 02/02/21 1757 02/03/21 0229 02/03/21 0953 02/03/21 1113 02/04/21 0403 02/04/21 0935 02/04/21 1606 02/05/21 0554 02/06/21 0524 02/07/21 0629 02/08/21 0511  NA 117* 118* 120*   < > 124*  123* 124* 122* 123* 126* 124* 124*  K 4.1 3.7 3.3*  --  4.4  --   --  3.9 4.1 3.6 3.8  CL 84* 88* 88*  --  95*  --   --  92* 95* 92* 88*  CO2 22 22 23   --  22  --   --  24 23 24 27   GLUCOSE 123* 100* 158*  --  99  --   --  103* 101* 102* 104*  BUN 14 11 10   --  11  --   --  9 13 8 10   CREATININE 0.98 0.82 0.88  --  0.91  --   --  0.82 0.94 0.81 0.91  ALBUMIN 4.2  --  3.5  --   --   --   --   --   --   --   --   CALCIUM 9.2 8.1* 8.7*  --  8.0*  --   --  8.1* 8.7* 8.4* 8.5*  PHOS 3.7  --  2.3*  --   --   --   --   --   --   --   --   AST 22  --   --   --   --   --   --   --   --   --   --   ALT 14  --   --   --   --   --   --   --   --   --   --    < > = values in this interval not displayed.   Liver Function Tests: Recent Labs  Lab 02/02/21 1757 02/03/21 0953  AST 22  --   ALT 14  --   ALKPHOS 65  --   BILITOT 0.7  --   PROT 7.5  --   ALBUMIN 4.2 3.5   No  results for input(s): LIPASE, AMYLASE in the last 168 hours. No results for input(s): AMMONIA in the last 168 hours. CBC: Recent Labs  Lab 02/02/21 1757 02/03/21 0229  WBC 4.4 3.0*  NEUTROABS 2.2  --   HGB 13.8 11.8*  HCT 39.0 33.5*  MCV 90.5 91.0  PLT 287 223   Cardiac Enzymes: No results for input(s): CKTOTAL,  CKMB, CKMBINDEX, TROPONINI in the last 168 hours. CBG: No results for input(s): GLUCAP in the last 168 hours.  Iron Studies: No results for input(s): IRON, TIBC, TRANSFERRIN, FERRITIN in the last 72 hours. Studies/Results: No results found. . cloNIDine  0.1 mg Oral TID  . diltiazem  360 mg Oral QHS  . ezetimibe  10 mg Oral Daily  . feeding supplement  237 mL Oral TID BM  . furosemide  20 mg Oral Daily  . guanFACINE  1 mg Oral Daily  . heparin  5,000 Units Subcutaneous Q8H  . hydrALAZINE  100 mg Oral TID  . irbesartan  300 mg Oral Daily  . loteprednol  1 drop Both Eyes Daily  . metoprolol tartrate  25 mg Oral BID  . mirtazapine  7.5 mg Oral QHS  . mometasone-formoterol  2 puff Inhalation BID  . multivitamin with minerals  1 tablet Oral Daily  . Netarsudil Dimesylate  1 drop Both Eyes QPM  . pantoprazole  40 mg Oral Daily    BMET    Component Value Date/Time   NA 124 (L) 02/08/2021 0511   K 3.8 02/08/2021 0511   CL 88 (L) 02/08/2021 0511   CO2 27 02/08/2021 0511   GLUCOSE 104 (H) 02/08/2021 0511   BUN 10 02/08/2021 0511   CREATININE 0.91 02/08/2021 0511   CREATININE 1.13 (H) 06/30/2020 0925   CALCIUM 8.5 (L) 02/08/2021 0511   GFRNONAA >60 02/08/2021 0511   GFRNONAA 42 (L) 06/11/2020 1324   GFRAA 49 (L) 06/11/2020 1324   CBC    Component Value Date/Time   WBC 3.0 (L) 02/03/2021 0229   RBC 3.68 (L) 02/03/2021 0229   HGB 11.8 (L) 02/03/2021 0229   HCT 33.5 (L) 02/03/2021 0229   PLT 223 02/03/2021 0229   MCV 91.0 02/03/2021 0229   MCH 32.1 02/03/2021 0229   MCHC 35.2 02/03/2021 0229   RDW 11.9 02/03/2021 0229   LYMPHSABS 1.3 02/02/2021 1757    MONOABS 0.7 02/02/2021 1757   EOSABS 0.1 02/02/2021 1757   BASOSABS 0.1 02/02/2021 1757    Assessment/Plan: 1.  Hyponatremia - presumably due to volume depletion given poor po intake for the past 3 weeks.   Agree with initial assessment as the etiology and treatment plan.  Initially improved but then stalled and went back down. . Sodium levels did initally improve with NS.  TSH and cortisol WNL.  a urine osm of over 400 in the setting of hyponatremia indicates SIADH- no current offending meds.  Have stopped the IVF as she had some edema and start lasix which should help to dilute her urine- will inc to 20 PO BID today.  This issue is notoriously slow to correct 2. This will be a recurring issue unless her anorexia and aversion to eating/drinking is addressed.   GI evaluation- has been started on remeron .  1. HTN - not sure of her home regimen.  Currently on clonidine 0.1 mg tid, dilt CD 360 mg daily and hydralazine 100 mg tid, metoprolol 12.5 mg bid, and avapro 300 mg daily.  BP variable ,  Amazingly not low with that regimen. Orthostatics do not indicate volume depletion.   2. FTT - Pt has presented to ED 5 times in the last 2 months.  Has ongoing anorexia and hyponatremia.  Will need to see GI for her anorexia but may also benefit from Palliative care consult since she is a full code-  Has started.  3. Disposition - primary is discussing  inpatient behavioral health possibly but does seem much brighter today.  Certainly would not want to send home with sodium trending down-  This is notoriously difficult and slow to treat but would want to see uptrend prior to discharge-  Hopefully will see soon    [place] [phone]

## 2021-02-08 NOTE — Progress Notes (Signed)
Alert and oriented x 4. Denies pain or discomfort. Call bell within reach, bed in low position.

## 2021-02-08 NOTE — Progress Notes (Signed)
PROGRESS NOTE   Stephanie West  RCV:[id number] DOB: 27-Aug-1941 DOA: 02/02/2021 PCP: Celene Squibb, MD   Chief Complaint  Patient presents with  . Abnormal Lab   Level of care: Med-Surg  Brief Admission History:  80 y.o. female, with history of mitral valve prolapse, hypertension, GERD, asthma, and more presents to the ED with a chief complaint of low sodium.  Patient had presented to her PCP for bilateral arm tremors, generalized weakness, fatigue blood work was done that showed a sodium of 117.  Patient usually runs a slightly low between 126 and 132, but this was low even for her.  Unfortunately patient is not able to give much history because she is confused.  Daughter, [name], is at bedside and reports that patient has not been eating for last 3 weeks mostly due to ongoing depression from loss of husband 2 years ago.    Assessment & Plan:   Principal Problem:   Acute hyponatremia Active Problems:   Hypertensive crisis   Hypochloremia   Anxiety   Acute metabolic encephalopathy   Malnutrition of moderate degree   Anorexia  1. Severe hyponatremia - Na peaked at 126, but now down to 124.  Pt is clinically feeling better.  This hyponatremia is presumably from longterm poor oral intake over several weeks, initially treated with IV fluid with normal saline infusion but on 4/18 IV fluids stopped and lasix started.  Now on lasix 20 mg BID.  Appreciate nephrology consultation and recommendations.  Continue to follow the BMP.  2. Metabolic encephalopathy - RESOLVED.  Mentation improved.   3. Hypertensive urgency - BP has been difficult to control.  Resumed all home BP meds and BP slowly better controlled but remains suboptimal.  4. Severe depression -since loss of husband, when medically cleared will ask for TTS evaluation. TTS eval requested.  She may need inpatient gero-psych.  Pt is medically stable and mentally able to participate in TTS evaluation today 4/19.  5. Anorexia - spoke with GI,  trial of remeron 7.5 mg QHS started.  CT chest abd pelvis to evaluate for underlying malignancy was negative for any acute findings.  6. GERD - remains on protonix for GI protection.  7. Meningioma - she was scheduled to see neurosurgery outpatient on Monday 4/18, I asked daughter to reschedule as she likely will still be in hospital.    DVT prophylaxis: SCD  Code Status: full  Family Communication: daughter updated telephone Disposition: home with home health Status is: Inpatient  Remains inpatient appropriate because:Persistent severe electrolyte disturbances and Inpatient level of care appropriate due to severity of illness  Dispo: The patient is from: Home              Anticipated d/c is to: TBD              Patient currently is not medically stable to d/c.   Difficult to place patient No  Consultants:   Nephrology   Procedures:   N/a   Antimicrobials:  N/a    Subjective: Pt says depression seems a little better, she is eating and drinking more and ambulating in room.    Objective: Vitals:   02/07/21 2050 02/08/21 0523 02/08/21 0725 02/08/21 0921  BP:  (!) 151/64  (!) 178/58  Pulse:  73  71  Resp:  18  18  Temp:  97.9 F (36.6 C)  97.7 F (36.5 C)  TempSrc:  Oral  Oral  SpO2: 94% 94% 95% 98%  Weight:  52.2 kg    Height:        Intake/Output Summary (Last 24 hours) at 02/08/2021 1230 Last data filed at 02/08/2021 0900 Gross per 24 hour  Intake 960 ml  Output 800 ml  Net 160 ml   Filed Weights   02/04/21 1206 02/07/21 0500 02/08/21 0523  Weight: 51.3 kg 52.9 kg 52.2 kg    Examination:  General exam: frail, elderly female, Appears calm and comfortable  Respiratory system: Clear to auscultation. Respiratory effort normal. Cardiovascular system: normal S1 & S2 heard. No JVD, murmurs, rubs, gallops or clicks. No pedal edema. Gastrointestinal system: Abdomen is nondistended, soft and nontender. No organomegaly or masses felt. Normal bowel sounds  heard. Central nervous system: Alert and oriented. No focal neurological deficits. Extremities: Symmetric 5 x 5 power. Skin: No rashes, lesions or ulcers Psychiatry: Judgement and insight appear good. Mood & affect depressed.   Data Reviewed: I have personally reviewed following labs and imaging studies  CBC: Recent Labs  Lab 02/02/21 1757 02/03/21 0229  WBC 4.4 3.0*  NEUTROABS 2.2  --   HGB 13.8 11.8*  HCT 39.0 33.5*  MCV 90.5 91.0  PLT 287 920    Basic Metabolic Panel: Recent Labs  Lab 02/02/21 1757 02/03/21 0229 02/03/21 0953 02/03/21 1113 02/04/21 0403 02/04/21 0935 02/04/21 1606 02/05/21 0554 02/06/21 0524 02/07/21 0629 02/08/21 0511  NA 117* 118* 120*   < > 124*  123*   < > 122* 123* 126* 124* 124*  K 4.1 3.7 3.3*  --  4.4  --   --  3.9 4.1 3.6 3.8  CL 84* 88* 88*  --  95*  --   --  92* 95* 92* 88*  CO2 22 22 23   --  22  --   --  24 23 24 27   GLUCOSE 123* 100* 158*  --  99  --   --  103* 101* 102* 104*  BUN 14 11 10   --  11  --   --  9 13 8 10   CREATININE 0.98 0.82 0.88  --  0.91  --   --  0.82 0.94 0.81 0.91  CALCIUM 9.2 8.1* 8.7*  --  8.0*  --   --  8.1* 8.7* 8.4* 8.5*  MG 1.9 1.6*  --   --  2.3  --   --   --   --   --   --   PHOS 3.7  --  2.3*  --   --   --   --   --   --   --   --    < > = values in this interval not displayed.    GFR: Estimated Creatinine Clearance: 39.3 mL/min (by C-G formula based on SCr of 0.91 mg/dL).  Liver Function Tests: Recent Labs  Lab 02/02/21 1757 02/03/21 0953  AST 22  --   ALT 14  --   ALKPHOS 65  --   BILITOT 0.7  --   PROT 7.5  --   ALBUMIN 4.2 3.5    CBG: No results for input(s): GLUCAP in the last 168 hours.  Recent Results (from the past 240 hour(s))  SARS CORONAVIRUS 2 (TAT 6-24 HRS) Nasopharyngeal Nasopharyngeal Swab     Status: None   Collection Time: 02/02/21  9:14 PM   Specimen: Nasopharyngeal Swab  Result Value Ref Range Status   SARS Coronavirus 2 NEGATIVE NEGATIVE Final    Comment:  (NOTE) SARS-CoV-2 target nucleic acids are NOT  DETECTED.  The SARS-CoV-2 RNA is generally detectable in upper and lower respiratory specimens during the acute phase of infection. Negative results do not preclude SARS-CoV-2 infection, do not rule out co-infections with other pathogens, and should not be used as the sole basis for treatment or other patient management decisions. Negative results must be combined with clinical observations, patient history, and epidemiological information. The expected result is Negative.  Fact Sheet for Patients: SugarRoll.be  Fact Sheet for Healthcare Providers: https://www.woods-mathews.com/  This test is not yet approved or cleared by the [place] FDA and  has been authorized for detection and/or diagnosis of SARS-CoV-2 by FDA under an Emergency Use Authorization (EUA). This EUA will remain  in effect (meaning this test can be used) for the duration of the COVID-19 declaration under Se ction 564(b)(1) of the Act, 21 U.S.C. section 360bbb-3(b)(1), unless the authorization is terminated or revoked sooner.  Performed at [facility], [place] [address], [place], [place] [address]      Radiology Studies: No results found.  Scheduled Meds: . cloNIDine  0.1 mg Oral TID  . diltiazem  360 mg Oral QHS  . ezetimibe  10 mg Oral Daily  . feeding supplement  237 mL Oral TID BM  . furosemide  20 mg Oral BID  . guanFACINE  1 mg Oral Daily  . heparin  5,000 Units Subcutaneous Q8H  . hydrALAZINE  100 mg Oral TID  . irbesartan  300 mg Oral Daily  . loteprednol  1 drop Both Eyes Daily  . metoprolol tartrate  25 mg Oral BID  . mirtazapine  7.5 mg Oral QHS  . mometasone-formoterol  2 puff Inhalation BID  . multivitamin with minerals  1 tablet Oral Daily  . Netarsudil Dimesylate  1 drop Both Eyes QPM  . pantoprazole  40 mg Oral Daily   Continuous Infusions:    LOS: 6 days   Time spent: 35 mins    Alicia Ackert Wynetta Emery, MD How to contact the [facility] Attending or Consulting provider [place] or covering provider during after hours [place], for this patient?  1. Check the care team in [facility] and look for a) attending/consulting [facility] provider listed and b) the [facility] team listed 2. Log into [email or web address] and use [place]'s universal password to access. If you do not have the password, please contact the hospital operator. 3. Locate the [facility] provider you are looking for under [facility] and page to a number that you can be directly reached. 4. If you still have difficulty reaching the provider, please page the [facility] (Director on Call) for the Hospitalists listed on amion for assistance.  02/08/2021, 12:30 PM

## 2021-02-08 NOTE — Progress Notes (Addendum)
Physical Therapy Treatment Patient Details Name: Stephanie West MRN: [id number] DOB: August 05, 1941 Today's Date: 02/08/2021    History of Present Illness Stephanie West is an 80 y.o. female sent from her PCP's office due to low sodium level noticed on labs; repeat laps in ED noted Na of 117. Pt recently in ED on 01/17/21 for dizziness. PMH: HTN, mitral valve prolapse, GERD, and asthma.    PT Comments    Pt progressed to ambulate without RW this session, single minor LOB initially and pt able to recover without physical assistance, then able to complete turns and direction changes without LOB. Pt cued on BLE strengthening exercises, able to perform STS reps with hands pushing on thighs to power up and no physical assist or cues. Pt pleasant throughout session, reports has RW at home if still feeling "shaky" when getting home. Pt's daughter enters room at EOS, pt tolerates remaining up in chair with all needs in reach; RN notified of session and pt up in chair without chair alarm and daughter in room.    Follow Up Recommendations  Home health PT;Supervision - Intermittent;Supervision for mobility/OOB     Equipment Recommendations  None recommended by PT    Recommendations for Other Services       Precautions / Restrictions Precautions Precautions: Fall Restrictions Weight Bearing Restrictions: No    Mobility  Bed Mobility Overal bed mobility: Modified Independent  General bed mobility comments: slightly increased time with light use of bedrail    Transfers Overall transfer level: Needs assistance Equipment used: None Transfers: Sit to/from Stand;Stand Pivot Transfers Sit to Stand: Supervision Stand pivot transfers: Supervision    General transfer comment: BUE assisting to power up with hands on thighs, good steadiness upon rising, able to transfer to chair at bedside without AD  Ambulation/Gait Ambulation/Gait assistance: Min guard;Supervision Gait Distance (Feet): 150  Feet Assistive device: Rolling walker (2 wheeled);None Gait Pattern/deviations: Step-through pattern;Decreased stride length;Trunk flexed Gait velocity: slightly decreased   General Gait Details: initial ambulation using RW lightly so progressed to no AD, pt with mild LOB once and able to recover independently, pt reaching towards handrail occasionally while ambulating without additional LOB, able to complete turns and direction changes with slightly increased time   Stairs             Wheelchair Mobility    Modified Rankin (Stroke Patients Only)       Balance Overall balance assessment: Needs assistance Sitting-balance support: Feet supported Sitting balance-Leahy Scale: Good Sitting balance - Comments: seated EOB   Standing balance support: During functional activity;No upper extremity supported Standing balance-Leahy Scale: Fair Standing balance comment: ambulates without RW       Cognition Arousal/Alertness: Awake/alert Behavior During Therapy: WFL for tasks assessed/performed Overall Cognitive Status: Within Functional Limits for tasks assessed       Exercises General Exercises - Lower Extremity Long Arc Quad: Seated;AROM;Strengthening;Both;20 reps Toe Raises: Standing;AROM;Strengthening;Both;10 reps Other Exercises Other Exercises: STS, 10 reps with hands pushing from thighs    General Comments        Pertinent Vitals/Pain Pain Assessment: No/denies pain    Home Living                      Prior Function            PT Goals (current goals can now be found in the care plan section) Acute Rehab PT Goals Patient Stated Goal: Return to prior level of function. PT Goal Formulation: With  patient Time For Goal Achievement: 02/17/21 Potential to Achieve Goals: Fair Progress towards PT goals: Progressing toward goals    Frequency    Min 3X/week      PT Plan Current plan remains appropriate;Equipment recommendations need to be updated     Co-evaluation              AM-PAC PT "6 Clicks" Mobility   Outcome Measure  Help needed turning from your back to your side while in a flat bed without using bedrails?: None Help needed moving from lying on your back to sitting on the side of a flat bed without using bedrails?: None Help needed moving to and from a bed to a chair (including a wheelchair)?: A Little Help needed standing up from a chair using your arms (e.g., wheelchair or bedside chair)?: A Little Help needed to walk in hospital room?: A Little Help needed climbing 3-5 steps with a railing? : A Little 6 Click Score: 20    End of Session   Activity Tolerance: Patient tolerated treatment well Patient left: in chair;with call bell/phone within reach;with family/visitor present Nurse Communication: Mobility status PT Visit Diagnosis: Unsteadiness on feet (R26.81);Other abnormalities of gait and mobility (R26.89);Muscle weakness (generalized) (M62.81)     Time: 9211-9417 PT Time Calculation (min) (ACUTE ONLY): 20 min  Charges:  $Gait Training: 8-22 mins                      Tori Bradd Merlos PT, DPT 02/08/21, 12:13 PM
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
## 2021-02-09 DIAGNOSIS — E871 Hypo-osmolality and hyponatremia: Secondary | ICD-10-CM | POA: Diagnosis not present

## 2021-02-09 LAB — HEPATIC FUNCTION PANEL
ALT: 15 U/L (ref 0–44)
AST: 24 U/L (ref 15–41)
Albumin: 3 g/dL — ABNORMAL LOW (ref 3.5–5.0)
Alkaline Phosphatase: 52 U/L (ref 38–126)
Bilirubin, Direct: 0.1 mg/dL (ref 0.0–0.2)
Indirect Bilirubin: 0.5 mg/dL (ref 0.3–0.9)
Total Bilirubin: 0.6 mg/dL (ref 0.3–1.2)
Total Protein: 5.9 g/dL — ABNORMAL LOW (ref 6.5–8.1)

## 2021-02-09 LAB — RENAL FUNCTION PANEL
Albumin: 3.1 g/dL — ABNORMAL LOW (ref 3.5–5.0)
Anion gap: 11 (ref 5–15)
BUN: 12 mg/dL (ref 8–23)
CO2: 28 mmol/L (ref 22–32)
Calcium: 8.7 mg/dL — ABNORMAL LOW (ref 8.9–10.3)
Chloride: 86 mmol/L — ABNORMAL LOW (ref 98–111)
Creatinine, Ser: 1.04 mg/dL — ABNORMAL HIGH (ref 0.44–1.00)
GFR, Estimated: 54 mL/min — ABNORMAL LOW (ref >60)
Glucose, Bld: 133 mg/dL — ABNORMAL HIGH (ref 70–99)
Phosphorus: 3.9 mg/dL (ref 2.5–4.6)
Potassium: 3.3 mmol/L — ABNORMAL LOW (ref 3.5–5.1)
Sodium: 125 mmol/L — ABNORMAL LOW (ref 135–145)

## 2021-02-09 LAB — SODIUM: Sodium: 126 mmol/L — ABNORMAL LOW (ref 135–145)

## 2021-02-09 MED ORDER — TOLVAPTAN 15 MG PO TABS
7.5000 mg | ORAL_TABLET | Freq: Once | ORAL | Status: AC
  Administered 2021-02-09: 7.5 mg via ORAL
  Filled 2021-02-09: qty 1

## 2021-02-09 MED ORDER — MEGESTROL ACETATE 400 MG/10ML PO SUSP
400.0000 mg | Freq: Every day | ORAL | Status: DC
  Administered 2021-02-09 – 2021-02-11 (×3): 400 mg via ORAL
  Filled 2021-02-09 (×3): qty 10

## 2021-02-09 MED ORDER — CLONIDINE HCL 0.2 MG/24HR TD PTWK
0.3000 mg | MEDICATED_PATCH | TRANSDERMAL | Status: DC
  Administered 2021-02-09: 0.3 mg via TRANSDERMAL
  Filled 2021-02-09: qty 1

## 2021-02-09 MED ORDER — POTASSIUM CHLORIDE CRYS ER 20 MEQ PO TBCR
40.0000 meq | EXTENDED_RELEASE_TABLET | ORAL | Status: AC
  Administered 2021-02-09 (×2): 40 meq via ORAL
  Filled 2021-02-09 (×2): qty 2

## 2021-02-09 NOTE — TOC Progression Note (Signed)
Transition of Care [facility]) - Progression Note    Patient Details  Name: Stephanie West MRN: [id number] Date of Birth: Dec 30, 1940  [facility] CM/SW Contact  Natasha Bence, LCSW Phone Number: 02/09/2021, 4:35 PM  Clinical Narrative:    MD notified CSW that patient's mentation has improved and requested patient to be reevaluated by [facility]. Debbrah Alar NP agreeable to reaccess patient. CSW referred patient to [facility] pending psych cleared. [facility] agreeable to [facility] patient once psych cleared. TOC to follow.     Barriers to Discharge: Continued Medical Work up  Expected Discharge Plan and Services                                                 Social Determinants of Health (SDOH) Interventions    Readmission Risk Interventions No flowsheet data found.

## 2021-02-09 NOTE — Progress Notes (Addendum)
Patient ID: Stephanie West, female   DOB: Mar 14, 1941, 80 y.o.   MRN: [id number]  S:  strict ins/outs not available.  Charted as 8 urine voids over 4/19.   Noted previous discussion with palliative care-  Wants full scope of care.  Has most recently been on lasix 20 mg BID. Last dose 9am this morning.  States that lasix was a new medication for her and she has never had any trouble with swelling.  Per primary team she hadn't been eating earlier.   Review of systems: denies shortness of breath or chest pain States nausea earlier this week but now resolved   O:BP (!) 147/67 (BP Location: Left Arm)   Pulse 81   Temp 98.5 F (36.9 C)   Resp 19   Ht 5\' 4"  (1.626 m)   Wt 48.9 kg   SpO2 92%   BMI 18.50 kg/m   Intake/Output Summary (Last 24 hours) at 02/09/2021 0924 Last data filed at 02/08/2021 1800 Gross per 24 hour  Intake 120 ml  Output --  Net 120 ml   Intake/Output: I/O last 3 completed shifts: In: 600 [P.O.:600] Out: 800 [Urine:800]  Intake/Output this shift:  No intake/output data recorded. Weight change: -3.302 kg   General adult female in bed in no acute distress HEENT normocephalic atraumatic extraocular movements intact sclera anicteric Neck supple trachea midline Lungs clear to auscultation bilaterally normal work of breathing at rest  Heart S1S2 no rubs or gallops appreciated Abdomen soft nontender nondistended Extremities no edema  Psych normal mood and affect Neuro - alert and oriented x 3 provides hx and follows commands  Recent Labs  Lab 02/02/21 1757 02/03/21 0229 02/03/21 0953 02/03/21 1113 02/04/21 0403 02/04/21 0935 02/04/21 1606 02/05/21 0554 02/06/21 0524 02/07/21 0629 02/08/21 0511 02/09/21 0440  NA 117*   < > 120*   < > 124*  123* 124* 122* 123* 126* 124* 124* 125*  K 4.1   < > 3.3*  --  4.4  --   --  3.9 4.1 3.6 3.8 3.3*  CL 84*   < > 88*  --  95*  --   --  92* 95* 92* 88* 86*  CO2 22   < > 23  --  22  --   --  24 23 24 27 28   GLUCOSE  123*   < > 158*  --  99  --   --  103* 101* 102* 104* 133*  BUN 14   < > 10  --  11  --   --  9 13 8 10 12   CREATININE 0.98   < > 0.88  --  0.91  --   --  0.82 0.94 0.81 0.91 1.04*  ALBUMIN 4.2  --  3.5  --   --   --   --   --   --   --   --  3.1*  CALCIUM 9.2   < > 8.7*  --  8.0*  --   --  8.1* 8.7* 8.4* 8.5* 8.7*  PHOS 3.7  --  2.3*  --   --   --   --   --   --   --   --  3.9  AST 22  --   --   --   --   --   --   --   --   --   --   --   ALT 14  --   --   --   --   --   --   --   --   --   --   --    < > =  values in this interval not displayed.   Liver Function Tests: Recent Labs  Lab 02/02/21 1757 02/03/21 0953 02/09/21 0440  AST 22  --   --   ALT 14  --   --   ALKPHOS 65  --   --   BILITOT 0.7  --   --   PROT 7.5  --   --   ALBUMIN 4.2 3.5 3.1*   No results for input(s): LIPASE, AMYLASE in the last 168 hours. No results for input(s): AMMONIA in the last 168 hours. CBC: Recent Labs  Lab 02/02/21 1757 02/03/21 0229  WBC 4.4 3.0*  NEUTROABS 2.2  --   HGB 13.8 11.8*  HCT 39.0 33.5*  MCV 90.5 91.0  PLT 287 223   Cardiac Enzymes: No results for input(s): CKTOTAL, CKMB, CKMBINDEX, TROPONINI in the last 168 hours. CBG: No results for input(s): GLUCAP in the last 168 hours.  Studies/Results: No results found. . cloNIDine  0.1 mg Oral TID  . diltiazem  360 mg Oral QHS  . ezetimibe  10 mg Oral Daily  . feeding supplement  237 mL Oral TID BM  . furosemide  20 mg Oral BID  . guanFACINE  1 mg Oral Daily  . heparin  5,000 Units Subcutaneous Q8H  . hydrALAZINE  100 mg Oral TID  . irbesartan  300 mg Oral Daily  . loteprednol  1 drop Both Eyes Daily  . metoprolol tartrate  25 mg Oral BID  . mirtazapine  7.5 mg Oral QHS  . mometasone-formoterol  2 puff Inhalation BID  . multivitamin with minerals  1 tablet Oral Daily  . Netarsudil Dimesylate  1 drop Both Eyes QPM  . pantoprazole  40 mg Oral Daily  . potassium chloride  40 mEq Oral Q3H    BMET    Component Value  Date/Time   NA 125 (L) 02/09/2021 0440   K 3.3 (L) 02/09/2021 0440   CL 86 (L) 02/09/2021 0440   CO2 28 02/09/2021 0440   GLUCOSE 133 (H) 02/09/2021 0440   BUN 12 02/09/2021 0440   CREATININE 1.04 (H) 02/09/2021 0440   CREATININE 1.13 (H) 06/30/2020 0925   CALCIUM 8.7 (L) 02/09/2021 0440   GFRNONAA 54 (L) 02/09/2021 0440   GFRNONAA 42 (L) 06/11/2020 1324   GFRAA 49 (L) 06/11/2020 1324   CBC    Component Value Date/Time   WBC 3.0 (L) 02/03/2021 0229   RBC 3.68 (L) 02/03/2021 0229   HGB 11.8 (L) 02/03/2021 0229   HCT 33.5 (L) 02/03/2021 0229   PLT 223 02/03/2021 0229   MCV 91.0 02/03/2021 0229   MCH 32.1 02/03/2021 0229   MCHC 35.2 02/03/2021 0229   RDW 11.9 02/03/2021 0229   LYMPHSABS 1.3 02/02/2021 1757   MONOABS 0.7 02/02/2021 1757   EOSABS 0.1 02/02/2021 1757   BASOSABS 0.1 02/02/2021 1757    Assessment/Plan: 1.  Hyponatremia - per charting initially presumably due to volume depletion given poor po intake for the past 3 weeks.  Sodium levels did initally improve with NS.  TSH and cortisol WNL.  a urine osm of over 400 in the setting of hyponatremia indicates SIADH- no current offending meds.  Was started on lasix here. 1. Stop lasix for now  2. tolvaptan 7.5 mg PO once now.  Check sodium every 8 hours.  No free water restriction.  3. Note that this will be a recurring issue unless her anorexia and aversion to eating/drinking is addressed.   GI evaluation- has  been started on remeron .  1. HTN - Orthostatics do not indicate volume depletion.    2. FTT - Pt has presented to ED 5 times in the last 2 months.  Has ongoing anorexia and hyponatremia.  Will need to see GI for her anorexia appreciate palliative care input as well   3. Disposition - continue inpatient monitoring.  Certainly would not want to send home with sodium trending down-  This is notoriously difficult and slow to treat but would want to see uptrend prior to discharge-  Hopefully will see soon    Claudia Desanctis, MD [facility] 02/09/2021 9:24 AM  Discussed with patient to drink to thirst today.  She has been doing better eating she states and has been brighter per charting.   Claudia Desanctis, MD 02/09/2021 9:52 AM

## 2021-02-09 NOTE — Progress Notes (Signed)
PROGRESS NOTE   Stephanie West  UXL:[id number] DOB: 1941-01-02 DOA: 02/02/2021 PCP: Celene Squibb, MD   Chief Complaint  Patient presents with  . Abnormal Lab   Level of care: Med-Surg  Brief Admission History:  80 y.o. female, with history of mitral valve prolapse, hypertension, GERD, asthma, and more presents to the ED with a chief complaint of low sodium.  Patient had presented to her PCP for bilateral arm tremors, generalized weakness, fatigue blood work was done that showed a sodium of 117.  Patient usually runs a slightly low between 126 and 132, but this was low even for her.  Unfortunately patient is not able to give much history because she is confused.  Daughter, [name], is at bedside and reports that patient has not been eating for last 3 weeks mostly due to ongoing depression from loss of husband 2 years ago.    Assessment & Plan:   Principal Problem:   Acute hyponatremia Active Problems:   Hypertensive crisis   Hypochloremia   Anxiety   Acute metabolic encephalopathy   Malnutrition of moderate degree   Anorexia  1. Severe hyponatremia -sodium was 117 on 02/02/2021, Na peaked at 126 initially after IV fluids, but now down to 125.  Pt is clinically feeling better.  This hyponatremia is presumably from longterm poor oral intake over several weeks, initially treated with IV fluid with normal saline infusion but on 4/18 IV fluids stopped and lasix started.  -Discussed with Dr. Royce Macadamia for nephrology service, she recommended stopping Lasix and trying tolvaptan on 02/09/21 -Liberalize fluid intake and recheck BMP in a.m.  2. Metabolic encephalopathy - RESOLVED.  Mentation improved. -It was most likely due to #1 above  3. Hypertensive urgency - Bp much improved, patient is on clonidine 0.1 mg 3 times daily, will switch to clonidine patch -Continue Cardizem 360 daily, along with hydralazine 100 mg 3 times daily and AV pro 300 mg daily, also on metoprolol 25 mg twice  daily  4. severe depression -since loss of husband couple of years ago,  -TTS recommended inpatient geri-psych.  -- Patient is neither suicidal nor homicidal -Patient and family request that she did not go to [place] psych inpatient unit they will rather do outpatient behavioral therapy counseling -Awaiting reevaluation by TTS team  5)Anorexia -related to depression --Remeron may worsen hyponatremia and may increase his sleepiness -Use Megace instead for appetite stimulation CT chest abd pelvis to evaluate for underlying malignancy was negative for any acute findings.   6)GERD - remains on protonix for GI protection.   7)Meningioma - she was scheduled to see neurosurgery outpatient on Monday 4/18,  daughter to reschedule     DVT prophylaxis: SCD  Code Status: full  Family Communication: daughters Jackelyn Poling and [name]) updated at bedside disposition: home with home health Status is: Inpatient  Remains inpatient appropriate because:Persistent severe electrolyte disturbances and Inpatient level of care appropriate due to severity of illness  Dispo: The patient is from: Home              Anticipated d/c is to: Home with HH Vs inp geri-psych              Patient currently is not medically stable to d/c.   Difficult to place patient No  Consultants:   Nephrology /TTS  Procedures:   N/a   Antimicrobials:  N/a    Subjective: -Patient's daughters Jackelyn Poling and [name] at bedside -Oral intake is fair -More awake more interactive.  Objective: Vitals:   02/09/21 0323 02/09/21 0600 02/09/21 0721 02/09/21 1348  BP: (!) 147/67   121/61  Pulse: 81   64  Resp: 19   15  Temp: 98.5 F (36.9 C)   98.8 F (37.1 C)  TempSrc:    Oral  SpO2: 93%  92% 96%  Weight:  48.9 kg    Height:        Intake/Output Summary (Last 24 hours) at 02/09/2021 1504 Last data filed at 02/09/2021 1300 Gross per 24 hour  Intake 540 ml  Output --  Net 540 ml   Filed Weights   02/07/21 0500 02/08/21 0523  02/09/21 0600  Weight: 52.9 kg 52.2 kg 48.9 kg    Examination:  . Physical Exam  Gen:- Awake Alert,  In no apparent distress  HEENT:- [place].AT, No sclera icterus Neck-Supple Neck,No JVD,.  Lungs-  CTAB , good air movement CV- S1, S2 normal Abd-  +ve B.Sounds, Abd Soft, No tenderness,    Extremity/Skin:- No  edema,    Psych-affect is flat, oriented x3, denies SI/HI Neuro-generalized weakness ,no new focal deficits, no tremors   Data Reviewed: I have personally reviewed following labs and imaging studies  CBC: Recent Labs  Lab 02/02/21 1757 02/03/21 0229  WBC 4.4 3.0*  NEUTROABS 2.2  --   HGB 13.8 11.8*  HCT 39.0 33.5*  MCV 90.5 91.0  PLT 287 740    Basic Metabolic Panel: Recent Labs  Lab 02/02/21 1757 02/03/21 0229 02/03/21 0953 02/03/21 1113 02/04/21 0403 02/04/21 0935 02/05/21 0554 02/06/21 0524 02/07/21 0629 02/08/21 0511 02/09/21 0440  NA 117* 118* 120*   < > 124*  123*   < > 123* 126* 124* 124* 125*  K 4.1 3.7 3.3*  --  4.4  --  3.9 4.1 3.6 3.8 3.3*  CL 84* 88* 88*  --  95*  --  92* 95* 92* 88* 86*  CO2 22 22 23   --  22  --  24 23 24 27 28   GLUCOSE 123* 100* 158*  --  99  --  103* 101* 102* 104* 133*  BUN 14 11 10   --  11  --  9 13 8 10 12   CREATININE 0.98 0.82 0.88  --  0.91  --  0.82 0.94 0.81 0.91 1.04*  CALCIUM 9.2 8.1* 8.7*  --  8.0*  --  8.1* 8.7* 8.4* 8.5* 8.7*  MG 1.9 1.6*  --   --  2.3  --   --   --   --   --   --   PHOS 3.7  --  2.3*  --   --   --   --   --   --   --  3.9   < > = values in this interval not displayed.    GFR: Estimated Creatinine Clearance: 32.2 mL/min (A) (by C-G formula based on SCr of 1.04 mg/dL (H)).  Liver Function Tests: Recent Labs  Lab 02/02/21 1757 02/03/21 0953 02/09/21 0440  AST 22  --  24  ALT 14  --  15  ALKPHOS 65  --  52  BILITOT 0.7  --  0.6  PROT 7.5  --  5.9*  ALBUMIN 4.2 3.5 3.0*  3.1*    CBG: No results for input(s): GLUCAP in the last 168 hours.  Recent Results (from the past 240  hour(s))  SARS CORONAVIRUS 2 (TAT 6-24 HRS) Nasopharyngeal Nasopharyngeal Swab     Status: None   Collection  Time: 02/02/21  9:14 PM   Specimen: Nasopharyngeal Swab  Result Value Ref Range Status   SARS Coronavirus 2 NEGATIVE NEGATIVE Final    Comment: (NOTE) SARS-CoV-2 target nucleic acids are NOT DETECTED.  The SARS-CoV-2 RNA is generally detectable in upper and lower respiratory specimens during the acute phase of infection. Negative results do not preclude SARS-CoV-2 infection, do not rule out co-infections with other pathogens, and should not be used as the sole basis for treatment or other patient management decisions. Negative results must be combined with clinical observations, patient history, and epidemiological information. The expected result is Negative.  Fact Sheet for Patients: SugarRoll.be  Fact Sheet for Healthcare Providers: https://www.woods-mathews.com/  This test is not yet approved or cleared by the [place] FDA and  has been authorized for detection and/or diagnosis of SARS-CoV-2 by FDA under an Emergency Use Authorization (EUA). This EUA will remain  in effect (meaning this test can be used) for the duration of the COVID-19 declaration under Se ction 564(b)(1) of the Act, 21 U.S.C. section 360bbb-3(b)(1), unless the authorization is terminated or revoked sooner.  Performed at [facility], [place] [address] [place], [place] [address]      Radiology Studies: No results found.  Scheduled Meds: . cloNIDine  0.1 mg Oral TID  . diltiazem  360 mg Oral QHS  . ezetimibe  10 mg Oral Daily  . feeding supplement  237 mL Oral TID BM  . guanFACINE  1 mg Oral Daily  . heparin  5,000 Units Subcutaneous Q8H  . hydrALAZINE  100 mg Oral TID  . irbesartan  300 mg Oral Daily  . loteprednol  1 drop Both Eyes Daily  . metoprolol tartrate  25 mg Oral BID  . mirtazapine  7.5 mg Oral QHS  . mometasone-formoterol  2  puff Inhalation BID  . multivitamin with minerals  1 tablet Oral Daily  . Netarsudil Dimesylate  1 drop Both Eyes QPM  . pantoprazole  40 mg Oral Daily   Continuous Infusions:    LOS: 7 days   Roxan Hockey, MD How to contact the [facility] Attending or Consulting provider [place] or covering provider during after hours [place], for this patient?  1. Check the care team in [facility] and look for a) attending/consulting TRH provider listed and b) the [facility] team listed 2. Log into [email or web address] and use [place]'s universal password to access. If you do not have the password, please contact the hospital operator. 3. Locate the [facility] provider you are looking for under [facility] and page to a number that you can be directly reached. 4. If you still have difficulty reaching the provider, please page the [facility] (Director on Call) for the Hospitalists listed on amion for assistance.  02/09/2021, 3:04 PM

## 2021-02-09 NOTE — Plan of Care (Signed)

## 2021-02-09 NOTE — Progress Notes (Signed)
Physical Therapy Treatment Patient Details Name: Stephanie West MRN: [id number] DOB: 03/01/1941 Today's Date: 02/09/2021    History of Present Illness Stephanie West is an 80 y.o. female sent from her PCP's office due to low sodium level noticed on labs; repeat laps in ED noted Na of 117. Pt recently in ED on 01/17/21 for dizziness. PMH: HTN, mitral valve prolapse, GERD, and asthma.    PT Comments    Patient seated EOB at beginning of session. Patient completes seated exercises demonstrating good balance. Patient transfers to standing without AD or assist with minimal unsteadiness upon standing and c/o slight dizziness. Patient able to ambulate without AD today without loss of balance. Patient is limited by fatigue with ambulation today and ends session seated in chair. Patient will benefit from continued physical therapy in hospital and recommended venue below to increase strength, balance, endurance for safe ADLs and gait.    Follow Up Recommendations  Home health PT;Supervision - Intermittent;Supervision for mobility/OOB     Equipment Recommendations  None recommended by PT    Recommendations for Other Services       Precautions / Restrictions Precautions Precautions: Fall Restrictions Weight Bearing Restrictions: No    Mobility  Bed Mobility Overal bed mobility: Modified Independent             General bed mobility comments: seated EOB at beginning of session    Transfers Overall transfer level: Needs assistance Equipment used: None Transfers: Sit to/from [employer] Transfers Sit to Stand: Supervision Stand pivot transfers: Supervision       General transfer comment: BUE assisting to power up with hands on thighs, slight unsteadiness upon intial standing with c/o slight dizziness  Ambulation/Gait Ambulation/Gait assistance: Min guard;Supervision Gait Distance (Feet): 150 Feet Assistive device: None Gait Pattern/deviations: Step-through  pattern;Decreased stride length;Trunk flexed Gait velocity: slightly decreased   General Gait Details: Patient ambulates without AD without loss of balance   Stairs             Wheelchair Mobility    Modified Rankin (Stroke Patients Only)       Balance Overall balance assessment: Needs assistance Sitting-balance support: Feet supported Sitting balance-Leahy Scale: Normal Sitting balance - Comments: seated EOB   Standing balance support: During functional activity;No upper extremity supported Standing balance-Leahy Scale: Fair Standing balance comment: ambulates without RW                            Cognition Arousal/Alertness: Awake/alert Behavior During Therapy: WFL for tasks assessed/performed Overall Cognitive Status: Within Functional Limits for tasks assessed                                        Exercises General Exercises - Lower Extremity Long Arc Quad: Seated;AROM;Strengthening;Both;20 reps Hip Flexion/Marching: AROM;Both;Seated;20 reps    General Comments        Pertinent Vitals/Pain Pain Assessment: No/denies pain    Home Living                      Prior Function            PT Goals (current goals can now be found in the care plan section) Acute Rehab PT Goals Patient Stated Goal: Return to prior level of function. PT Goal Formulation: With patient Time For Goal Achievement: 02/17/21 Potential to Achieve Goals: Fair  Progress towards PT goals: Progressing toward goals    Frequency    Min 3X/week      PT Plan Current plan remains appropriate    Co-evaluation              AM-PAC PT "6 Clicks" Mobility   Outcome Measure  Help needed turning from your back to your side while in a flat bed without using bedrails?: None Help needed moving from lying on your back to sitting on the side of a flat bed without using bedrails?: None Help needed moving to and from a bed to a chair (including a  wheelchair)?: A Little Help needed standing up from a chair using your arms (e.g., wheelchair or bedside chair)?: A Little Help needed to walk in hospital room?: A Little Help needed climbing 3-5 steps with a railing? : A Little 6 Click Score: 20    End of Session Equipment Utilized During Treatment: Gait belt Activity Tolerance: Patient tolerated treatment well Patient left: in chair;with call bell/phone within reach Nurse Communication: Mobility status PT Visit Diagnosis: Unsteadiness on feet (R26.81);Other abnormalities of gait and mobility (R26.89);Muscle weakness (generalized) (M62.81)     Time: 4193-7902 PT Time Calculation (min) (ACUTE ONLY): 10 min  Charges:  $Therapeutic Activity: 8-22 mins                     4:07 PM, 02/09/21 Mearl Latin PT, DPT Physical Therapist at [facility]

## 2021-02-09 NOTE — Progress Notes (Signed)
Message doctor about medication ordered at this time and her vital signs. Her blood pressure is 109/78 and pulse 64. She has hydralazine 100mg , clonidine 0.1mg , diltiazem 24 hr 360mg , and metoprolol 25 mg. MD stated to hold medication at this time.

## 2021-02-09 NOTE — Progress Notes (Signed)
Pt. meets criteria for inpatient treatment per Earleen Newport, NP. Referred out to the following hospitals:  Destination  Service Provider Address Phone Fax  [facility]  [address] [place] [address] [phone]  [facility], [place] [address] [phone]  [facility]  [place], [place] [address] [place]  [facility]  [address], [place] [address] [phone] [facility]  [address] [place] [address] [phone] [facility]  [address] [place] [address] [phone]  [facility]  [address] [place] [address] [phone]  [facility]  [address] [place] [address] [phone] [place]  [address], [place] [address] [phone]  [facility]  [address], [place] [address] [phone] [place]  [address] [place] [address] [phone] [facility]  [address] [place] [address] [phone]  [facility]  [place], [place] [address] [place]  [facility]  [address], [place] [address] [phone] [facility]  [address], [place] [address] [phone] [facility]  [address], [place] [address] [phone]      Disposition CSW will continue to follow for placement    Signed:  Durenda Hurt, MSW, [place], LCASA 02/09/2021 9:44 AM

## 2021-02-09 NOTE — Care Management Important Message (Signed)
Important Message  Patient Details  Name: Stephanie West MRN: [id number] Date of Birth: May 15, 1941   Medicare Important Message Given:  Yes     Tommy Medal 02/09/2021, 12:38 PM

## 2021-02-09 NOTE — Progress Notes (Addendum)
ADDENDUM  Patient declined by [facility]. Admissions representative reports the attending physician is unable to support potential medical needs. CSW will continue to pursue alternative placement.   Signed:  Durenda Hurt, MSW, [place], [place] 02/09/2021 11:43 AM  Patient under review at [place]. Additional documentation sent for admission consideration. CSW will continue to follow disposition and update ED staff when details become available.   Signed:  Durenda Hurt, MSW, [place], LCASA 02/09/2021 10:37 AM

## 2021-02-09 NOTE — Progress Notes (Signed)
Alert and oriented x 4. Denies pain. C/o sick stomach and poor appetite. Needs supervision with adls. Medication taken without problems. Had a televisit with Geropsychiatry. Looks withdrawn but brightens with approach.po intake encouraged but poor intake persists. Will continue to monitor.

## 2021-02-10 DIAGNOSIS — E871 Hypo-osmolality and hyponatremia: Secondary | ICD-10-CM | POA: Diagnosis not present

## 2021-02-10 LAB — RENAL FUNCTION PANEL
Albumin: 3 g/dL — ABNORMAL LOW (ref 3.5–5.0)
Anion gap: 8 (ref 5–15)
BUN: 21 mg/dL (ref 8–23)
CO2: 26 mmol/L (ref 22–32)
Calcium: 8.6 mg/dL — ABNORMAL LOW (ref 8.9–10.3)
Chloride: 94 mmol/L — ABNORMAL LOW (ref 98–111)
Creatinine, Ser: 1.64 mg/dL — ABNORMAL HIGH (ref 0.44–1.00)
GFR, Estimated: 31 mL/min — ABNORMAL LOW (ref >60)
Glucose, Bld: 122 mg/dL — ABNORMAL HIGH (ref 70–99)
Phosphorus: 4.2 mg/dL (ref 2.5–4.6)
Potassium: 4.5 mmol/L (ref 3.5–5.1)
Sodium: 128 mmol/L — ABNORMAL LOW (ref 135–145)

## 2021-02-10 LAB — SODIUM: Sodium: 127 mmol/L — ABNORMAL LOW (ref 135–145)

## 2021-02-10 MED ORDER — CLONIDINE HCL 0.2 MG/24HR TD PTWK
0.2000 mg | MEDICATED_PATCH | TRANSDERMAL | Status: DC
  Administered 2021-02-10: 0.2 mg via TRANSDERMAL
  Filled 2021-02-10: qty 1

## 2021-02-10 MED ORDER — CLONIDINE HCL 0.2 MG/24HR TD PTWK: 0.2000 mg | MEDICATED_PATCH | TRANSDERMAL | Status: DC

## 2021-02-10 MED ORDER — POLYETHYLENE GLYCOL 3350 17 G PO PACK
17.0000 g | PACK | Freq: Every day | ORAL | Status: DC
  Administered 2021-02-10 – 2021-02-11 (×2): 17 g via ORAL
  Filled 2021-02-10 (×2): qty 1

## 2021-02-10 MED ORDER — SODIUM CHLORIDE 0.9 % IV SOLN: INTRAVENOUS | Status: AC

## 2021-02-10 MED ORDER — SENNOSIDES-DOCUSATE SODIUM 8.6-50 MG PO TABS
2.0000 | ORAL_TABLET | Freq: Every day | ORAL | Status: DC
  Administered 2021-02-10: 2 via ORAL
  Filled 2021-02-10: qty 2

## 2021-02-10 NOTE — Plan of Care (Signed)

## 2021-02-10 NOTE — Progress Notes (Signed)
PROGRESS NOTE   Stephanie West  WGY:[id number] DOB: 19-Jan-1941 DOA: 02/02/2021 PCP: Celene Squibb, MD   Chief Complaint  Patient presents with  . Abnormal Lab   Level of care: Med-Surg  Brief Admission History:  80 y.o. female, with history of mitral valve prolapse, hypertension, GERD, asthma, and more presents to the ED with a chief complaint of low sodium.  Patient had presented to her PCP for bilateral arm tremors, generalized weakness, fatigue blood work was done that showed a sodium of 117.  Patient usually runs a slightly low between 126 and 132, but this was low even for her.  Unfortunately patient is not able to give much history because she is confused.  Daughter, [name], is at bedside and reports that patient has not been eating for last 3 weeks mostly due to ongoing depression from loss of husband 2 years ago.    Assessment & Plan:   Principal Problem:   Acute hyponatremia Active Problems:   Hypertensive crisis   Hypochloremia   Anxiety   Acute metabolic encephalopathy   Malnutrition of moderate degree   Anorexia  1)Severe HypoNatremia - sodium was 117 on 02/02/2021,  Na is up to 128 -- Pt is clinically feeling better.  This hyponatremia is presumably from longterm poor oral intake over several weeks, initially treated with IV fluid with normal saline infusion but on 02/07/21-- IV fluids stopped and lasix started.  -Discussed with Dr. Royce Macadamia for nephrology service, she recommended stopping Lasix and trying tolvaptan on 02/09/21 -Liberalize fluid intake  -follow serum Na level  2)Metabolic encephalopathy - RESOLVED.  Mentation improved. -It was most likely due to #1 above  3)Hypertensive urgency - Bp much improved,  Change clonidine 0.2 mg patch -Continue Cardizem 360 daily, along with hydralazine 100 mg 3 times daily, continue metoprolol 25 mg twice daily -Stop AVapro due to AKI  4)severe depression -since loss of husband couple of years ago,  -TTS reevaluated patient  and no longer recommends inpatient geri-psych.  -- Patient is neither suicidal nor homicidal -Patient and family request that she did not go to [place] psych inpatient unit they will rather do outpatient behavioral therapy counseling -Awaiting reevaluation by TTS team  5)Anorexia -related to depression --Remeron may worsen hyponatremia and may increase his sleepiness -Use Megace instead for appetite stimulation CT chest abd pelvis to evaluate for underlying malignancy was negative for any acute findings.   6)GERD - remains on protonix for GI protection.   7)Meningioma - she was scheduled to see neurosurgery outpatient on Monday 4/18,  daughter to reschedule    8)AKI----acute kidney injury--creatinine up to 1.6 from a baseline around 1 -hold Avapro, reduce clonidine to avoid hypotension -Hydrate IV and orally  renally adjust medications, avoid nephrotoxic agents / dehydration  / hypotension  DVT prophylaxis: SCD  Code Status: full  Family Communication: daughters Jackelyn Poling and Pocola) updated at bedside disposition: home with home health Status is: Inpatient  Remains inpatient appropriate because:Persistent severe electrolyte disturbances and Inpatient level of care appropriate due to severity of illness -- Continue IV fluids due to hyponatremia and AKI Dispo: The patient is from: Home              Anticipated d/c is to: Home              Patient currently is not medically stable to d/c.   Difficult to place patient No  Consultants:   Nephrology /TTS  Procedures:   N/a   Antimicrobials:  N/a  Subjective: -Oral intake is not worse, no BM, daughter at [facility] at bedside -No vomiting -Not voiding a whole lot Objective: Vitals:   02/09/21 2010 02/09/21 2104 02/10/21 0359 02/10/21 0718  BP:  109/67 113/62   Pulse:  64 96   Resp:  18 20   Temp:  97.9 F (36.6 C) 98.1 F (36.7 C)   TempSrc:      SpO2: 95% 94% 93% 94%  Weight:      Height:        Intake/Output Summary  (Last 24 hours) at 02/10/2021 1532 Last data filed at 02/09/2021 1800 Gross per 24 hour  Intake 180 ml  Output --  Net 180 ml   Filed Weights   02/07/21 0500 02/08/21 0523 02/09/21 0600  Weight: 52.9 kg 52.2 kg 48.9 kg    Examination:  Physical Exam  Gen:- Awake Alert,  In no apparent distress  HEENT:- [place].AT, No sclera icterus Neck-Supple Neck,No JVD,.  Lungs-  CTAB , good air movement CV- S1, S2 normal Abd-  +ve B.Sounds, Abd Soft, No tenderness,    Extremity/Skin:- No  edema,    Psych-affect is flat, oriented x3, denies SI/HI Neuro-generalized weakness ,no new focal deficits, no tremors   Data Reviewed: I have personally reviewed following labs and imaging studies  CBC: No results for input(s): WBC, NEUTROABS, HGB, HCT, MCV, PLT in the last 168 hours.  Basic Metabolic Panel: Recent Labs  Lab 02/04/21 0403 02/04/21 0935 02/06/21 0524 02/07/21 0629 02/08/21 0511 02/09/21 0440 02/09/21 1734 02/10/21 0301  NA 124*  123*   < > 126* 124* 124* 125* 126* 128*  127*  K 4.4   < > 4.1 3.6 3.8 3.3*  --  4.5  CL 95*   < > 95* 92* 88* 86*  --  94*  CO2 22   < > 23 24 27 28   --  26  GLUCOSE 99   < > 101* 102* 104* 133*  --  122*  BUN 11   < > 13 8 10 12   --  21  CREATININE 0.91   < > 0.94 0.81 0.91 1.04*  --  1.64*  CALCIUM 8.0*   < > 8.7* 8.4* 8.5* 8.7*  --  8.6*  MG 2.3  --   --   --   --   --   --   --   PHOS  --   --   --   --   --  3.9  --  4.2   < > = values in this interval not displayed.    GFR: Estimated Creatinine Clearance: 20.4 mL/min (A) (by C-G formula based on SCr of 1.64 mg/dL (H)).  Liver Function Tests: Recent Labs  Lab 02/09/21 0440 02/10/21 0301  AST 24  --   ALT 15  --   ALKPHOS 52  --   BILITOT 0.6  --   PROT 5.9*  --   ALBUMIN 3.0*  3.1* 3.0*    CBG: No results for input(s): GLUCAP in the last 168 hours.  Recent Results (from the past 240 hour(s))  SARS CORONAVIRUS 2 (TAT 6-24 HRS) Nasopharyngeal Nasopharyngeal Swab     Status:  None   Collection Time: 02/02/21  9:14 PM   Specimen: Nasopharyngeal Swab  Result Value Ref Range Status   SARS Coronavirus 2 NEGATIVE NEGATIVE Final    Comment: (NOTE) SARS-CoV-2 target nucleic acids are NOT DETECTED.  The SARS-CoV-2 RNA is generally detectable in upper and lower respiratory  specimens during the acute phase of infection. Negative results do not preclude SARS-CoV-2 infection, do not rule out co-infections with other pathogens, and should not be used as the sole basis for treatment or other patient management decisions. Negative results must be combined with clinical observations, patient history, and epidemiological information. The expected result is Negative.  Fact Sheet for Patients: SugarRoll.be  Fact Sheet for Healthcare Providers: https://www.woods-mathews.com/  This test is not yet approved or cleared by the [place] FDA and  has been authorized for detection and/or diagnosis of SARS-CoV-2 by FDA under an Emergency Use Authorization (EUA). This EUA will remain  in effect (meaning this test can be used) for the duration of the COVID-19 declaration under Se ction 564(b)(1) of the Act, 21 U.S.C. section 360bbb-3(b)(1), unless the authorization is terminated or revoked sooner.  Performed at [facility], [place] [address], [place], [place] [address]      Radiology Studies: No results found.  Scheduled Meds: . cloNIDine  0.2 mg Transdermal Weekly  . diltiazem  360 mg Oral QHS  . ezetimibe  10 mg Oral Daily  . feeding supplement  237 mL Oral TID BM  . guanFACINE  1 mg Oral Daily  . heparin  5,000 Units Subcutaneous Q8H  . hydrALAZINE  100 mg Oral TID  . loteprednol  1 drop Both Eyes Daily  . megestrol  400 mg Oral Daily  . metoprolol tartrate  25 mg Oral BID  . mometasone-formoterol  2 puff Inhalation BID  . multivitamin with minerals  1 tablet Oral Daily  . Netarsudil Dimesylate  1 drop Both Eyes  QPM  . pantoprazole  40 mg Oral Daily  . polyethylene glycol  17 g Oral Daily  . senna-docusate  2 tablet Oral QHS   Continuous Infusions: . sodium chloride 75 mL/hr at 02/10/21 1043    LOS: 8 days   Roxan Hockey, MD How to contact the [facility] Attending or Consulting provider [place] or covering provider during after hours [place], for this patient?  1. Check the care team in [facility] and look for a) attending/consulting TRH provider listed and b) the [facility] team listed 2. Log into [email or web address] and use [place]'s universal password to access. If you do not have the password, please contact the hospital operator. 3. Locate the [facility] provider you are looking for under [facility] and page to a number that you can be directly reached. 4. If you still have difficulty reaching the provider, please page the [facility] (Director on Call) for the Hospitalists listed on amion for assistance.  02/10/2021, 3:32 PM

## 2021-02-10 NOTE — Progress Notes (Signed)
Physical Therapy Treatment Patient Details Name: Stephanie West MRN: [id number] DOB: [birth year] Today's Date: 02/10/2021    History of Present Illness Stephanie West is an 80 y.o. female sent from her PCP's office due to low sodium level noticed on labs; repeat laps in ED noted Na of 117. Pt recently in ED on 01/17/21 for dizziness. PMH: HTN, mitral valve prolapse, GERD, and asthma.    PT Comments    Pt friendly and willing to participate with therapy today.  Pt reports she felt unsteady upon standing and reports of dizziness through session.  Used RW for safety with gait, good cadence and no LOB episodes during gait.  Reports she has 1 step to get into home and unsure how she will do upon return home so added stair training this session.  Pt able to demonstrate safe mechanics with step to pattern, slow and controlled, did require both handrails for safety.  EOS pt left in chair with call bell within reach.  No reports of pain through session.  Did have some dizziness upon return to room though stated intensity has reduced.      Follow Up Recommendations  Home health PT;Supervision - Intermittent;Supervision for mobility/OOB     Equipment Recommendations  None recommended by PT    Recommendations for Other Services       Precautions / Restrictions Precautions Precautions: Fall    Mobility  Bed Mobility Overal bed mobility: Independent                  Transfers Overall transfer level: Modified independent Equipment used: None Transfers: Sit to/from Stand Sit to Stand: Supervision         General transfer comment: BUE A to assist with STS; reports of dizziness scale 2-3/10 upon standing wiht slight unsteadiness, pt requested to use RW with gait due to unsteadiness  Ambulation/Gait Ambulation/Gait assistance: Min guard;Supervision Gait Distance (Feet): 300 Feet Assistive device: Rolling walker (2 wheeled) Gait Pattern/deviations: Step-through pattern;Decreased  stride length;Trunk flexed Gait velocity: slightly decreased   General Gait Details: Increased distance with no LOB or unsteadiness   Stairs Stairs: Yes Stairs assistance: Supervision;Min guard Stair Management: Two rails Number of Stairs: 4 General stair comments: 2 sets with step to pattern   Wheelchair Mobility    Modified Rankin (Stroke Patients Only)       Balance                                            Cognition   Behavior During Therapy: WFL for tasks assessed/performed Overall Cognitive Status: Within Functional Limits for tasks assessed                                        Exercises General Exercises - Lower Extremity Ankle Circles/Pumps: AROM;Both;20 reps Long Arc Quad: Seated;AROM;Strengthening;Both;20 reps Hip Flexion/Marching: AROM;Both;Seated;20 reps Toe Raises: AROM;Strengthening;Both;10 reps;Seated Heel Raises: AROM;Both;10 reps;Seated    General Comments        Pertinent Vitals/Pain Pain Assessment: No/denies pain    Home Living                      Prior Function            PT Goals (current goals can now be found in the  care plan section)      Frequency    Min 3X/week      PT Plan Current plan remains appropriate    Co-evaluation              AM-PAC PT "6 Clicks" Mobility   Outcome Measure  Help needed turning from your back to your side while in a flat bed without using bedrails?: None Help needed moving from lying on your back to sitting on the side of a flat bed without using bedrails?: None Help needed moving to and from a bed to a chair (including a wheelchair)?: None Help needed standing up from a chair using your arms (e.g., wheelchair or bedside chair)?: A Little Help needed to walk in hospital room?: A Little Help needed climbing 3-5 steps with a railing? : A Little 6 Click Score: 21    End of Session Equipment Utilized During Treatment: Gait belt Activity  Tolerance: Patient tolerated treatment well Patient left: in chair;with call bell/phone within reach Nurse Communication: Mobility status PT Visit Diagnosis: Unsteadiness on feet (R26.81);Other abnormalities of gait and mobility (R26.89);Muscle weakness (generalized) (M62.81)     Time: 8335-8251 PT Time Calculation (min) (ACUTE ONLY): 20 min  Charges:  $Therapeutic Activity: 8-22 mins                    Ihor Austin, LPTA/CLT; CBIS [phone]  Aldona Lento 02/10/2021, 4:21 PM

## 2021-02-10 NOTE — Consult Note (Signed)
Telepsych Consultation   Reason for Consult:  Psychiatric assessment  Referring Physician:  Roxan Hockey MD  Location of Patient: [facility] Location of Provider: [facility]  Patient Identification: [name] MRN:  [id number] Principal Diagnosis: Acute hyponatremia Diagnosis:  Principal Problem:   Acute hyponatremia Active Problems:   Hypertensive crisis   Hypochloremia   Anxiety   Acute metabolic encephalopathy   Malnutrition of moderate degree   Anorexia   Total Time spent with patient: 20 minutes  Subjective:    Stephanie West is a 80 y.o. female patient admitted with low sodium and confusion and seen via tele health by this provider, consulted with Dr. Mauro Kaufmann and chart reviewed on 02/10/21.  On evaluation Stephanie West reports she does not remember the events that led up to her hospitalization. However, she is feeling "much clearer" at this time.   HPI:    During evaluation [name] is laying in the bed in no acute distress.  She is alert, oriented x 4, pleasant, calm and cooperative. She reports her mood as good but states she does get depressed at times. Affect is congruent. She does not appear to be responding to internal/external stimuli or delusional thoughts.  Patient denies suicidal/self-harm/homicidal ideation, psychosis, and paranoia. Patient states that she is hopeful and the "wants to live"  Patient answered question appropriately.   Reports she has a supportive family that looks after her and has a close relationship with them .  Reports that she misses her husband that past over a year ago. This is the only reason she gets down at times.  States she has agreed to go to grief counseling.   Past Psychiatric History: denies  Risk to Self:  no Risk to Others:  no Prior Inpatient Therapy:  no Prior Outpatient Therapy:  no  Past Medical History:  Past Medical History:  Diagnosis Date  . Asthma   . Dysphagia 05/10/2017  .  Elevated transaminase level   . GERD (gastroesophageal reflux disease)   . Hypertension   . MVP (mitral valve prolapse)     Past Surgical History:  Procedure Laterality Date  . COLONOSCOPY    . ESOPHAGEAL DILATION  07/30/2015   Procedure: ESOPHAGEAL DILATION;  Surgeon: Rogene Houston, MD;  Location: [facility];  Service: Endoscopy;;  . ESOPHAGOGASTRODUODENOSCOPY N/A 04/15/2015   Procedure: ESOPHAGOGASTRODUODENOSCOPY (EGD);  Surgeon: Rogene Houston, MD;  Location: [facility];  Service: Endoscopy;  Laterality: N/A;  125 - moved to 1:00, Ann to notify pt  . ESOPHAGOGASTRODUODENOSCOPY N/A 07/30/2015   Procedure: ESOPHAGOGASTRODUODENOSCOPY (EGD);  Surgeon: Rogene Houston, MD;  Location: [facility];  Service: Endoscopy;  Laterality: N/A;  1040  . ESOPHAGOGASTRODUODENOSCOPY (EGD) WITH ESOPHAGEAL DILATION N/A 03/07/2013   Procedure: ESOPHAGOGASTRODUODENOSCOPY (EGD) WITH ESOPHAGEAL DILATION;  Surgeon: Rogene Houston, MD;  Location: [facility];  Service: Endoscopy;  Laterality: N/A;  830  . EYE SURGERY    . LAPAROSCOPIC TUBAL LIGATION  1984  . UPPER GASTROINTESTINAL ENDOSCOPY     Family History:  Family History  Problem Relation Age of Onset  . Hypertension Mother   . Prostate cancer Father    Family Psychiatric  History: unkown Social History:  Social History   Substance and Sexual Activity  Alcohol Use No  . Alcohol/week: 0.0 standard drinks     Social History   Substance and Sexual Activity  Drug Use No    Social History   Socioeconomic History  .  Marital status: Widowed    Spouse name: Not on file  . Number of children: Not on file  . Years of education: Not on file  . Highest education level: Not on file  Occupational History  . Not on file  Tobacco Use  . Smoking status: Never Smoker  . Smokeless tobacco: Never Used  Vaping Use  . Vaping Use: Never used  Substance and Sexual Activity  . Alcohol use: No    Alcohol/week: 0.0 standard drinks  . Drug use:  No  . Sexual activity: Not on file  Other Topics Concern  . Not on file  Social History Narrative  . Not on file   Social Determinants of Health   Financial Resource Strain: Not on file  Food Insecurity: Not on file  Transportation Needs: Not on file  Physical Activity: Not on file  Stress: Not on file  Social Connections: Not on file   Additional Social History:    Allergies:   Allergies  Allergen Reactions  . Apple Other (See Comments)    Wheezing and Asthma Symptoms   . Aspirin Other (See Comments)    Wheezing, Asthma Symptoms   . Avelox [Moxifloxacin Hcl In Nacl] Hives    Pt can take cipro  . Bee Venom Other (See Comments)    Wheezing, Asthma Symptoms  . Benadryl [Diphenhydramine Hcl] Other (See Comments)    Wheezing, Asthma Symptoms   . Chicken Allergy Other (See Comments)    Wheezing and Asthma Symptoms   . Codeine Other (See Comments)    Makes patient feel like she's going to pass out.   Haze Boyden [Gemifloxacin Mesylate] Hives  . Factive [Gemifloxacin] Hives  . Hctz [Hydrochlorothiazide]     hyponatremia  . Iodinated Diagnostic Agents Other (See Comments)    Pt was told by allergist to avoid  Dye due to other allergies  . Iodine   . Levaquin [Levofloxacin In D5w] Hives    Pt can take cipro  . Levofloxacin Hives  . Penicillins Other (See Comments)    Asthma Symptoms .Did it involve swelling of the face/tongue/throat, SOB, or low BP? No Did it involve sudden or severe rash/hives, skin peeling, or any reaction on the inside of your mouth or nose? Yes Did you need to seek medical attention at a hospital or doctor's office? Unknown When did it last happen?Unkown If all above answers are "NO", may proceed with cephalosporin use.   . Sulfa Antibiotics Nausea Only  . Tequin [Gatifloxacin] Hives    Labs:  Results for orders placed or performed during the hospital encounter of 02/02/21 (from the past 48 hour(s))  Renal function panel     Status: Abnormal    Collection Time: 02/09/21  4:40 AM  Result Value Ref Range   Sodium 125 (L) 135 - 145 mmol/L   Potassium 3.3 (L) 3.5 - 5.1 mmol/L   Chloride 86 (L) 98 - 111 mmol/L   CO2 28 22 - 32 mmol/L   Glucose, Bld 133 (H) 70 - 99 mg/dL    Comment: Glucose reference range applies only to samples taken after fasting for at least 8 hours.   BUN 12 8 - 23 mg/dL   Creatinine, Ser 1.04 (H) 0.44 - 1.00 mg/dL   Calcium 8.7 (L) 8.9 - 10.3 mg/dL   Phosphorus 3.9 2.5 - 4.6 mg/dL   Albumin 3.1 (L) 3.5 - 5.0 g/dL   GFR, Estimated 54 (L) >60 mL/min    Comment: (NOTE) Calculated using the CKD-EPI  Creatinine Equation (2021)    Anion gap 11 5 - 15    Comment: Performed at [facility], [address], [place], [place] [address]  Hepatic function panel (Baseline)     Status: Abnormal   Collection Time: 02/09/21  4:40 AM  Result Value Ref Range   Total Protein 5.9 (L) 6.5 - 8.1 g/dL   Albumin 3.0 (L) 3.5 - 5.0 g/dL   AST 24 15 - 41 U/L   ALT 15 0 - 44 U/L   Alkaline Phosphatase 52 38 - 126 U/L   Total Bilirubin 0.6 0.3 - 1.2 mg/dL   Bilirubin, Direct 0.1 0.0 - 0.2 mg/dL   Indirect Bilirubin 0.5 0.3 - 0.9 mg/dL    Comment: Performed at [facility], [address] [place], [place] [address]  Sodium     Status: Abnormal   Collection Time: 02/09/21  5:34 PM  Result Value Ref Range   Sodium 126 (L) 135 - 145 mmol/L    Comment: Performed at [facility], [address] [place], [place] [address]  Sodium     Status: Abnormal   Collection Time: 02/10/21  3:01 AM  Result Value Ref Range   Sodium 127 (L) 135 - 145 mmol/L    Comment: Performed at [facility], [address] [place] [address] [place], [place] [address]  Renal function panel     Status: Abnormal   Collection Time: 02/10/21  3:01 AM  Result Value Ref Range   Sodium 128 (L) 135 - 145 mmol/L   Potassium 4.5 3.5 - 5.1 mmol/L    Comment: DELTA CHECK NOTED   Chloride 94 (L) 98 - 111 mmol/L   CO2 26 22 - 32 mmol/L   Glucose, Bld 122 (H) 70 - 99 mg/dL    Comment:  Glucose reference range applies only to samples taken after fasting for at least 8 hours.   BUN 21 8 - 23 mg/dL   Creatinine, Ser 1.64 (H) 0.44 - 1.00 mg/dL   Calcium 8.6 (L) 8.9 - 10.3 mg/dL   Phosphorus 4.2 2.5 - 4.6 mg/dL   Albumin 3.0 (L) 3.5 - 5.0 g/dL   GFR, Estimated 31 (L) >60 mL/min    Comment: (NOTE) Calculated using the CKD-EPI Creatinine Equation (2021)    Anion gap 8 5 - 15    Comment: Performed at [facility], [address], [place], [place] [address]    Medications:  Current Facility-Administered Medications  Medication Dose Route Frequency Provider Last Rate Last Admin  . 0.9 %  sodium chloride infusion   Intravenous Continuous Claudia Desanctis, MD      . acetaminophen (TYLENOL) tablet 650 mg  650 mg Oral Q6H PRN Zierle-Ghosh, Asia B, DO   650 mg at 02/05/21 0525   Or  . acetaminophen (TYLENOL) suppository 650 mg  650 mg Rectal Q6H PRN Zierle-Ghosh, Asia B, DO      . albuterol (VENTOLIN HFA) 108 (90 Base) MCG/ACT inhaler 2 puff  2 puff Inhalation Q6H PRN Zierle-Ghosh, Asia B, DO      . ALPRAZolam (XANAX) tablet 0.25 mg  0.25 mg Oral BID PRN Zierle-Ghosh, Asia B, DO   0.25 mg at 02/03/21 0932  . [START ON 02/16/2021] cloNIDine (CATAPRES - Dosed in mg/24 hr) patch 0.2 mg  0.2 mg Transdermal Weekly Emokpae, Courage, MD      . diltiazem (CARDIZEM CD) 24 hr capsule 360 mg  360 mg Oral QHS Zierle-Ghosh, Asia B, DO   360 mg at 02/08/21 2135  .  ezetimibe (ZETIA) tablet 10 mg  10 mg Oral Daily Zierle-Ghosh, Asia B, DO   10 mg at 02/09/21 0856  . feeding supplement (ENSURE ENLIVE / ENSURE PLUS) liquid 237 mL  237 mL Oral TID BM Johnson, Clanford L, MD   237 mL at 02/09/21 2000  . guanFACINE (INTUNIV) ER tablet 1 mg  1 mg Oral Daily Zierle-Ghosh, Asia B, DO   1 mg at 02/09/21 0858  . heparin injection 5,000 Units  5,000 Units Subcutaneous Q8H Zierle-Ghosh, Asia B, DO   5,000 Units at 02/10/21 0507  . hydrALAZINE (APRESOLINE) tablet 100 mg  100 mg Oral TID Zierle-Ghosh, Asia B, DO    100 mg at 02/09/21 1726  . loteprednol (LOTEMAX) 0.5 % ophthalmic suspension 1 drop  1 drop Both Eyes Daily Wynetta Emery, Clanford L, MD   1 drop at 02/09/21 2221  . megestrol (MEGACE) 400 MG/10ML suspension 400 mg  400 mg Oral Daily Emokpae, Courage, MD   400 mg at 02/09/21 1750  . metoprolol tartrate (LOPRESSOR) tablet 25 mg  25 mg Oral BID Wynetta Emery, Clanford L, MD   25 mg at 02/09/21 0857  . mometasone-formoterol (DULERA) 200-5 MCG/ACT inhaler 2 puff  2 puff Inhalation BID Zierle-Ghosh, Asia B, DO   2 puff at 02/10/21 0718  . multivitamin with minerals tablet 1 tablet  1 tablet Oral Daily Wynetta Emery, Clanford L, MD   1 tablet at 02/09/21 0857  . Netarsudil Dimesylate 0.02 % SOLN 1 drop  1 drop Both Eyes QPM Zierle-Ghosh, Asia B, DO   1 drop at 02/09/21 2000  . ondansetron (ZOFRAN) tablet 4 mg  4 mg Oral Q6H PRN Zierle-Ghosh, Asia B, DO       Or  . ondansetron (ZOFRAN) injection 4 mg  4 mg Intravenous Q6H PRN Zierle-Ghosh, Asia B, DO   4 mg at 02/04/21 2223  . pantoprazole (PROTONIX) EC tablet 40 mg  40 mg Oral Daily Zierle-Ghosh, Asia B, DO   40 mg at 02/09/21 0858  . traMADol (ULTRAM) tablet 50 mg  50 mg Oral Q6H PRN Zierle-Ghosh, [place] B, DO        Musculoskeletal: Strength & Muscle Tone: decreased Gait & Station: unsteady Patient leans: N/A  Psychiatric Specialty Exam: Physical Exam Vitals reviewed.  Constitutional:      Appearance: Normal appearance.  HENT:     Head: Normocephalic.     Right Ear: Tympanic membrane normal.     Left Ear: Tympanic membrane normal.     Nose: Nose normal.     Mouth/Throat:     Pharynx: Oropharynx is clear.  Eyes:     Conjunctiva/sclera: Conjunctivae normal.  Cardiovascular:     Rate and Rhythm: Normal rate.  Pulmonary:     Effort: Pulmonary effort is normal. No respiratory distress.  Abdominal:     Tenderness: There is no guarding.  Musculoskeletal:        General: Normal range of motion.     Cervical back: Normal range of motion.  Skin:     General: Skin is dry.  Neurological:     Mental Status: She is alert and oriented to person, place, and time.  Psychiatric:        Attention and Perception: Attention normal.        Mood and Affect: Mood is depressed.        Speech: Speech normal.        Behavior: Behavior is not agitated, slowed, aggressive, withdrawn, hyperactive or combative. Behavior is cooperative.  Thought Content: Thought content normal. Thought content is not paranoid or delusional. Thought content does not include homicidal or suicidal ideation. Thought content does not include homicidal or suicidal plan.        Cognition and Memory: Cognition normal.        Judgment: Judgment normal.     Review of Systems  Constitutional: Negative.   HENT: Negative.   Eyes: Negative.   Respiratory: Negative.   Cardiovascular: Negative.   Gastrointestinal: Negative.   Endocrine: Negative.   Genitourinary: Negative.   Musculoskeletal: Negative.   Skin: Negative.   Allergic/Immunologic: Negative.   Neurological: Negative.   Hematological: Negative.   Psychiatric/Behavioral: Negative.     Blood pressure 113/62, pulse 96, temperature 98.1 F (36.7 C), resp. rate 20, height 5\' 4"  (1.626 m), weight 48.9 kg, SpO2 94 %.Body mass index is 18.5 kg/m.  General Appearance: Fairly Groomed  Eye Contact:  Good  Speech:  Clear and Coherent and Normal Rate  Volume:  Normal  Mood:  Depressed  Affect:  Appropriate and Congruent  Thought Process:  Coherent and Goal Directed  Orientation:  Full (Time, Place, and Person)  Thought Content:  Logical  Suicidal Thoughts:  No  Homicidal Thoughts:  No  Memory:  Immediate;   Fair Recent;   Fair Remote;   Fair  Judgement:  Good  Insight:  Good  Psychomotor Activity:  Normal  Concentration:  Concentration: Good and Attention Span: Good  Recall:  [place] of Knowledge:  Good  Language:  Good  Akathisia:  No  Handed:  Right  AIMS (if indicated):     Assets:  Communication  Skills Desire for Improvement Financial Resources/Insurance Housing Leisure Time Physical Health Resilience Social Support Transportation  ADL's:  Impaired  Cognition:  WNL  Sleep:   denies any sleep difficulties      Treatment Plan Summary: No medication recommendations at this time. Patient states she will follow up with out patient behavioral health services for grief counseling.  Disposition: No evidence of imminent risk to self or others at present.   Patient does not meet criteria for psychiatric inpatient admission. Supportive therapy provided about ongoing stressors. Discussed crisis plan, support from social network, calling 911, coming to the Emergency Department, and calling Suicide Hotline.  Patient is psychiatrically cleared.       This service was provided via telemedicine using a 2-way, interactive audio and video technology.  Names of all persons participating in this telemedicine service and their role in this encounter. Name: Hermenia Fiscal Role: patient   Name: Thomes Lolling  Role: NP  Name:  Role:   Name:  Role:   Notified  Attending Physician Courage Nikolski of disposition   Revonda Humphrey, NP 02/10/2021 10:00 AM

## 2021-02-10 NOTE — Progress Notes (Signed)
Patient ID: Stephanie West, female   DOB: 04-17-1941, 80 y.o.   MRN: [id number]  S:  strict ins/outs not available.  Charted as 4 urine voids over 4/20.  Got tolvaptan yesterday 7.5 mg on 4/20.  She feels ok this morning.  States was dizzy before coming in the hospital.  No hx of smoking.  States that she ate her food this morning "because it was brought to me".  States she lives at home with her cat and doesn't get out as much as she used to.  Review of systems: denies shortness of breath or chest pain States nausea earlier this week but now resolved Had breakfast    O:BP 113/62 (BP Location: Left Arm)   Pulse 96   Temp 98.1 F (36.7 C)   Resp 20   Ht 5\' 4"  (1.626 m)   Wt 48.9 kg   SpO2 94%   BMI 18.50 kg/m   Intake/Output Summary (Last 24 hours) at 02/10/2021 0900 Last data filed at 02/09/2021 1800 Gross per 24 hour  Intake 360 ml  Output --  Net 360 ml   Intake/Output: I/O last 3 completed shifts: In: 600 [P.O.:600] Out: -   Intake/Output this shift:  No intake/output data recorded. Weight change:    General adult female in bed in no acute distress  HEENT normocephalic atraumatic extraocular movements intact sclera anicteric Neck supple trachea midline Lungs clear to auscultation bilaterally normal work of breathing at rest  Heart S1S2 no rubs or gallops appreciated Abdomen soft nontender nondistended Extremities no edema  Psych normal mood and affect Neuro - alert and oriented x 3 provides hx and follows commands  Recent Labs  Lab 02/03/21 0953 02/03/21 1113 02/04/21 0403 02/04/21 0935 02/05/21 0554 02/06/21 0524 02/07/21 0629 02/08/21 0511 02/09/21 0440 02/09/21 1734 02/10/21 0301  NA 120*   < > 124*  123*   < > 123* 126* 124* 124* 125* 126* 128*  127*  K 3.3*  --  4.4  --  3.9 4.1 3.6 3.8 3.3*  --  4.5  CL 88*  --  95*  --  92* 95* 92* 88* 86*  --  94*  CO2 23  --  22  --  24 23 24 27 28   --  26  GLUCOSE 158*  --  99  --  103* 101* 102* 104* 133*   --  122*  BUN 10  --  11  --  9 13 8 10 12   --  21  CREATININE 0.88  --  0.91  --  0.82 0.94 0.81 0.91 1.04*  --  1.64*  ALBUMIN 3.5  --   --   --   --   --   --   --  3.0*  3.1*  --  3.0*  CALCIUM 8.7*  --  8.0*  --  8.1* 8.7* 8.4* 8.5* 8.7*  --  8.6*  PHOS 2.3*  --   --   --   --   --   --   --  3.9  --  4.2  AST  --   --   --   --   --   --   --   --  24  --   --   ALT  --   --   --   --   --   --   --   --  15  --   --    < > = values  in this interval not displayed.   Liver Function Tests: Recent Labs  Lab 02/03/21 0953 02/09/21 0440 02/10/21 0301  AST  --  24  --   ALT  --  15  --   ALKPHOS  --  52  --   BILITOT  --  0.6  --   PROT  --  5.9*  --   ALBUMIN 3.5 3.0*  3.1* 3.0*   No results for input(s): LIPASE, AMYLASE in the last 168 hours. No results for input(s): AMMONIA in the last 168 hours. CBC: No results for input(s): WBC, NEUTROABS, HGB, HCT, MCV, PLT in the last 168 hours. Cardiac Enzymes: No results for input(s): CKTOTAL, CKMB, CKMBINDEX, TROPONINI in the last 168 hours. CBG: No results for input(s): GLUCAP in the last 168 hours.  Studies/Results: No results found. . cloNIDine  0.3 mg Transdermal Weekly  . cloNIDine  0.1 mg Oral TID  . diltiazem  360 mg Oral QHS  . ezetimibe  10 mg Oral Daily  . feeding supplement  237 mL Oral TID BM  . guanFACINE  1 mg Oral Daily  . heparin  5,000 Units Subcutaneous Q8H  . hydrALAZINE  100 mg Oral TID  . irbesartan  300 mg Oral Daily  . loteprednol  1 drop Both Eyes Daily  . megestrol  400 mg Oral Daily  . metoprolol tartrate  25 mg Oral BID  . mometasone-formoterol  2 puff Inhalation BID  . multivitamin with minerals  1 tablet Oral Daily  . Netarsudil Dimesylate  1 drop Both Eyes QPM  . pantoprazole  40 mg Oral Daily    BMET    Component Value Date/Time   NA 127 (L) 02/10/2021 0301   NA 128 (L) 02/10/2021 0301   K 4.5 02/10/2021 0301   CL 94 (L) 02/10/2021 0301   CO2 26 02/10/2021 0301   GLUCOSE 122 (H)  02/10/2021 0301   BUN 21 02/10/2021 0301   CREATININE 1.64 (H) 02/10/2021 0301   CREATININE 1.13 (H) 06/30/2020 0925   CALCIUM 8.6 (L) 02/10/2021 0301   GFRNONAA 31 (L) 02/10/2021 0301   GFRNONAA 42 (L) 06/11/2020 1324   GFRAA 49 (L) 06/11/2020 1324   CBC    Component Value Date/Time   WBC 3.0 (L) 02/03/2021 0229   RBC 3.68 (L) 02/03/2021 0229   HGB 11.8 (L) 02/03/2021 0229   HCT 33.5 (L) 02/03/2021 0229   PLT 223 02/03/2021 0229   MCV 91.0 02/03/2021 0229   MCH 32.1 02/03/2021 0229   MCHC 35.2 02/03/2021 0229   RDW 11.9 02/03/2021 0229   LYMPHSABS 1.3 02/02/2021 1757   MONOABS 0.7 02/02/2021 1757   EOSABS 0.1 02/02/2021 1757   BASOSABS 0.1 02/02/2021 1757    Assessment/Plan: 1.  Hyponatremia - per charting initially presumably due to volume depletion given poor po intake for the past 3 weeks.  Sodium levels did initally improve with NS.  TSH and cortisol WNL.  a urine osm of over 400 in the setting of hyponatremia indicates SIADH- no current offending meds.  Was started on lasix here recently which has been discontinued. 1. S/p tolvaptan 7.5 mg PO once on 4/20 am.  2. Gentle NS as concurrent AKI now felt pre-renal 3. Note that this will be a recurring issue unless her anorexia and aversion to eating/drinking is addressed.   GI evaluation- has been started on remeron  4. I wonder if a retirement community or other living facility would be an option for her for  more of a sense of community.  Per primary team one of her daughters is planning to move in with her temporarily and they do not want her to go to a facility  1. AKI - may be pre-renal.  Note her pressures are also lower than her norm.  Have stopped lasix.  NS at 80ml/hr. Would avoid hypotension.  Note avapro was stopped.  Would reduce dose of catapres  2. HTN - lower than her norm.  Discussed with primary team and clonidine patch was reduced.  They have also held avapro  3. FTT - Pt has presented to ED 5 times in the last  2 months.  Has ongoing anorexia and hyponatremia.  Will need to see GI for her anorexia appreciate palliative care input as well   4. Disposition - continue inpatient monitoring.  Certainly would not want to send home with cr trending up.  Thankful for slight increase in sodium   Claudia Desanctis, MD [facility] 02/10/2021 9:19 AM

## 2021-02-11 DIAGNOSIS — E871 Hypo-osmolality and hyponatremia: Secondary | ICD-10-CM | POA: Diagnosis not present

## 2021-02-11 LAB — RENAL FUNCTION PANEL
Albumin: 2.9 g/dL — ABNORMAL LOW (ref 3.5–5.0)
Anion gap: 8 (ref 5–15)
BUN: 22 mg/dL (ref 8–23)
CO2: 26 mmol/L (ref 22–32)
Calcium: 8.7 mg/dL — ABNORMAL LOW (ref 8.9–10.3)
Chloride: 100 mmol/L (ref 98–111)
Creatinine, Ser: 1.3 mg/dL — ABNORMAL HIGH (ref 0.44–1.00)
GFR, Estimated: 41 mL/min — ABNORMAL LOW (ref >60)
Glucose, Bld: 88 mg/dL (ref 70–99)
Phosphorus: 3.5 mg/dL (ref 2.5–4.6)
Potassium: 4.1 mmol/L (ref 3.5–5.1)
Sodium: 134 mmol/L — ABNORMAL LOW (ref 135–145)

## 2021-02-11 MED ORDER — CLONIDINE 0.2 MG/24HR TD PTWK
0.2000 mg | MEDICATED_PATCH | TRANSDERMAL | 12 refills | Status: DC
Start: 2021-02-17 — End: 2024-02-20

## 2021-02-11 MED ORDER — ONDANSETRON HCL 4 MG PO TABS: 4.0000 mg | ORAL_TABLET | Freq: Four times a day (QID) | ORAL | 0 refills | Status: DC | PRN

## 2021-02-11 MED ORDER — METOPROLOL TARTRATE 25 MG PO TABS
25.0000 mg | ORAL_TABLET | Freq: Two times a day (BID) | ORAL | 3 refills | Status: DC
Start: 2021-02-11 — End: 2021-10-20

## 2021-02-11 MED ORDER — ENSURE ENLIVE PO LIQD: 237.0000 mL | Freq: Three times a day (TID) | ORAL | 12 refills | Status: DC

## 2021-02-11 MED ORDER — MEGESTROL ACETATE 400 MG/10ML PO SUSP: 400.0000 mg | Freq: Every day | ORAL | 0 refills | Status: DC

## 2021-02-11 MED ORDER — HYDRALAZINE HCL 50 MG PO TABS: 50.0000 mg | ORAL_TABLET | Freq: Three times a day (TID) | ORAL | 2 refills | Status: DC

## 2021-02-11 MED ORDER — SENNOSIDES-DOCUSATE SODIUM 8.6-50 MG PO TABS: 2.0000 | ORAL_TABLET | Freq: Every day | ORAL | 2 refills | Status: DC

## 2021-02-11 MED ORDER — ACETAMINOPHEN 325 MG PO TABS: 650.0000 mg | ORAL_TABLET | Freq: Four times a day (QID) | ORAL | 0 refills | Status: DC | PRN

## 2021-02-11 NOTE — Discharge Instructions (Signed)
1)Stop Clonidine Tablets and use Clonidine patch once weekly (place a new one every Thursday) 2)Avoid ibuprofen/Advil/Aleve/Motrin/Goody Powders/Naproxen/BC powders/Meloxicam/Diclofenac/Indomethacin and other Nonsteroidal anti-inflammatory medications as these will make you more likely to bleed and can cause stomach ulcers, can also cause Kidney problems.  3) repeat CBC and BMP blood test within a week with PCP advised 4) Ensure or boost nutritional supplements 2-3 times daily advised 5) outpatient follow-up with behavioral health counselor/psychologist/psychiatric team strongly advised

## 2021-02-11 NOTE — Progress Notes (Signed)
Patient ID: Stephanie West, female   DOB: April 10, 1941, 80 y.o.   MRN: [id number]  S:  got fluids yesterday - has been on NS at 75 ml/hr.  She states that she had an appetite for the first time this morning.  Feels better today.   Review of systems: denies shortness of breath or chest pain   Denies n/v Appetite is better   O:BP 134/66 (BP Location: Left Arm)   Pulse 66   Temp 98.7 F (37.1 C)   Resp 20   Ht 5\' 4"  (1.626 m)   Wt 48.9 kg   SpO2 94%   BMI 18.50 kg/m   Intake/Output Summary (Last 24 hours) at 02/11/2021 1013 Last data filed at 02/10/2021 1717 Gross per 24 hour  Intake 385.57 ml  Output 500 ml  Net -114.43 ml   Intake/Output: I/O last 3 completed shifts: In: 385.6 [I.V.:385.6] Out: 900 [Urine:900]  Intake/Output this shift:  No intake/output data recorded. Weight change:    General adult female in bed in no acute distress HEENT normocephalic atraumatic extraocular movements intact sclera anicteric Neck supple trachea midline Lungs clear to auscultation bilaterally normal work of breathing at rest  Heart S1S2 no rubs or gallops appreciated Abdomen soft nontender nondistended Extremities no edema  Psych normal mood and affect Neuro - alert and oriented x 3 provides hx and follows commands  Recent Labs  Lab 02/05/21 0554 02/06/21 0524 02/07/21 0629 02/08/21 0511 02/09/21 0440 02/09/21 1734 02/10/21 0301 02/11/21 0405  NA 123* 126* 124* 124* 125* 126* 128*  127* 134*  K 3.9 4.1 3.6 3.8 3.3*  --  4.5 4.1  CL 92* 95* 92* 88* 86*  --  94* 100  CO2 24 23 24 27 28   --  26 26  GLUCOSE 103* 101* 102* 104* 133*  --  122* 88  BUN 9 13 8 10 12   --  21 22  CREATININE 0.82 0.94 0.81 0.91 1.04*  --  1.64* 1.30*  ALBUMIN  --   --   --   --  3.0*  3.1*  --  3.0* 2.9*  CALCIUM 8.1* 8.7* 8.4* 8.5* 8.7*  --  8.6* 8.7*  PHOS  --   --   --   --  3.9  --  4.2 3.5  AST  --   --   --   --  24  --   --   --   ALT  --   --   --   --  15  --   --   --    Liver Function  Tests: Recent Labs  Lab 02/09/21 0440 02/10/21 0301 02/11/21 0405  AST 24  --   --   ALT 15  --   --   ALKPHOS 52  --   --   BILITOT 0.6  --   --   PROT 5.9*  --   --   ALBUMIN 3.0*  3.1* 3.0* 2.9*   Studies/Results: No results found. . cloNIDine  0.2 mg Transdermal Weekly  . diltiazem  360 mg Oral QHS  . ezetimibe  10 mg Oral Daily  . feeding supplement  237 mL Oral TID BM  . guanFACINE  1 mg Oral Daily  . heparin  5,000 Units Subcutaneous Q8H  . hydrALAZINE  100 mg Oral TID  . loteprednol  1 drop Both Eyes Daily  . megestrol  400 mg Oral Daily  . metoprolol tartrate  25 mg Oral BID  .  mometasone-formoterol  2 puff Inhalation BID  . multivitamin with minerals  1 tablet Oral Daily  . Netarsudil Dimesylate  1 drop Both Eyes QPM  . pantoprazole  40 mg Oral Daily  . polyethylene glycol  17 g Oral Daily  . senna-docusate  2 tablet Oral QHS    BMET    Component Value Date/Time   NA 134 (L) 02/11/2021 0405   K 4.1 02/11/2021 0405   CL 100 02/11/2021 0405   CO2 26 02/11/2021 0405   GLUCOSE 88 02/11/2021 0405   BUN 22 02/11/2021 0405   CREATININE 1.30 (H) 02/11/2021 0405   CREATININE 1.13 (H) 06/30/2020 0925   CALCIUM 8.7 (L) 02/11/2021 0405   GFRNONAA 41 (L) 02/11/2021 0405   GFRNONAA 42 (L) 06/11/2020 1324   GFRAA 49 (L) 06/11/2020 1324   CBC    Component Value Date/Time   WBC 3.0 (L) 02/03/2021 0229   RBC 3.68 (L) 02/03/2021 0229   HGB 11.8 (L) 02/03/2021 0229   HCT 33.5 (L) 02/03/2021 0229   PLT 223 02/03/2021 0229   MCV 91.0 02/03/2021 0229   MCH 32.1 02/03/2021 0229   MCHC 35.2 02/03/2021 0229   RDW 11.9 02/03/2021 0229   LYMPHSABS 1.3 02/02/2021 1757   MONOABS 0.7 02/02/2021 1757   EOSABS 0.1 02/02/2021 1757   BASOSABS 0.1 02/02/2021 1757    Assessment/Plan: 1.  Hyponatremia - per charting initially presumably due to volume depletion given poor po intake for the past 3 weeks.  Sodium levels did initally improve with NS.  TSH and cortisol WNL.  a  urine osm of over 400 in the setting of hyponatremia indicates SIADH- no current offending meds.  Was started on lasix here recently which has been discontinued. 1. S/p tolvaptan 7.5 mg PO once on 4/20 am.  2. S/p Gentle NS on 4/21 as concurrent AKI now felt pre-renal 3. Note that this will be a recurring issue unless her anorexia and aversion to eating/drinking is addressed. Seems improved and has been on megace.  GI has seen 4. I wonder if a retirement community or other living facility would be an option for her for more of a sense of community.  Per primary team one of her daughters is planning to move in with her temporarily and they do not want her to go to a facility  1. AKI - may be pre-renal.  Note her pressures are also lower than her norm.  Note avapro was stopped and dose of catapres was reduced.  improving  2. HTN - stable now.  Had been lower then her norm.  Clonidine patch was reduced 2/2 same  They have also held avapro given her mild AKI which is improving  3. FTT - Pt has presented to ED 5 times in the last 2 months.  Has ongoing anorexia and hyponatremia.  Will need to see GI for her anorexia appreciate palliative care input as well   Disposition - stable for discharge from a nephrology standpoint.  We will sign off.  She should follow- up with her PCP and can be referred back to nephrology if needed   Claudia Desanctis, MD [facility] 02/11/2021 10:28 AM

## 2021-02-11 NOTE — Care Management Important Message (Signed)
Important Message  Patient Details  Name: Stephanie West MRN: [id number] Date of Birth: 1940-12-11   Medicare Important Message Given:  Yes     Tommy Medal 02/11/2021, 11:07 AM

## 2021-02-11 NOTE — Discharge Summary (Signed)
Stephanie West, is a 80 y.o. female  DOB 1941-05-29  MRN [id number].  Admission date:  02/02/2021  Admitting Physician  Rolla Plate, DO  Discharge Date:  02/11/2021   Primary MD  Celene Squibb, MD  Recommendations for primary care physician for things to follow:   1)Stop Clonidine Tablets and use Clonidine patch once weekly (place a new one every Thursday) 2)Avoid ibuprofen/Advil/Aleve/Motrin/Goody Powders/Naproxen/BC powders/Meloxicam/Diclofenac/Indomethacin and other Nonsteroidal anti-inflammatory medications as these will make you more likely to bleed and can cause stomach ulcers, can also cause Kidney problems.  3) repeat CBC and BMP blood test within a week with PCP advised 4) Ensure or boost nutritional supplements 2-3 times daily advised 5) outpatient follow-up with behavioral health counselor/psychologist/psychiatric team strongly advised   Admission Diagnosis  Acute hyponatremia [E87.1]   Discharge Diagnosis  Acute hyponatremia [E87.1]    Principal Problem:   Acute hyponatremia Active Problems:   Hypertensive crisis   Hypochloremia   Anxiety   Acute metabolic encephalopathy   Malnutrition of moderate degree   Anorexia      Past Medical History:  Diagnosis Date  . Asthma   . Dysphagia 05/10/2017  . Elevated transaminase level   . GERD (gastroesophageal reflux disease)   . Hypertension   . MVP (mitral valve prolapse)     Past Surgical History:  Procedure Laterality Date  . COLONOSCOPY    . ESOPHAGEAL DILATION  07/30/2015   Procedure: ESOPHAGEAL DILATION;  Surgeon: Rogene Houston, MD;  [facility]: [facility];  Service: Endoscopy;;  . ESOPHAGOGASTRODUODENOSCOPY N/A 04/15/2015   Procedure: ESOPHAGOGASTRODUODENOSCOPY (EGD);  Surgeon: Rogene Houston, MD;  Location: [facility];  Service: Endoscopy;  Laterality: N/A;  125 - moved to 1:00, Ann to notify pt  .  ESOPHAGOGASTRODUODENOSCOPY N/A 07/30/2015   Procedure: ESOPHAGOGASTRODUODENOSCOPY (EGD);  Surgeon: Rogene Houston, MD;  [facility]: [facility];  Service: Endoscopy;  Laterality: N/A;  1040  . ESOPHAGOGASTRODUODENOSCOPY (EGD) WITH ESOPHAGEAL DILATION N/A 03/07/2013   Procedure: ESOPHAGOGASTRODUODENOSCOPY (EGD) WITH ESOPHAGEAL DILATION;  Surgeon: Rogene Houston, MD;  [facility]: [facility];  Service: Endoscopy;  Laterality: N/A;  830  . EYE SURGERY    . LAPAROSCOPIC TUBAL LIGATION  1984  . UPPER GASTROINTESTINAL ENDOSCOPY         HPI  from the history and physical done on the day of admission:     Stephanie West  is a 80 y.o. female, with history of mitral valve prolapse, hypertension, GERD, asthma, and more presents to the ED with a chief complaint of low sodium.  Patient had presented to her PCP for bilateral arm tremors, generalized weakness, fatigue blood work was done that showed a sodium of 117.  Patient usually runs a slightly low between 126 and 132, but this was low even for her.  Unfortunately patient is not able to give much history because she is confused.  Daughter, [name], is at bedside and reports that patient has not been eating for last 20 days.  She reports that  patient was in the ER on the 28th complaining of dizziness and confusion, and she was found to be dehydrated.  At that time her sodium was 128.  Since then family has been trying to make her eat and drink more.  Daughter reports that patient has glasses of water all over the house but she will only take a sip from each 1.  She is lucky if she eats half a piece of cheese toast or half a peanut butter and jelly for a day.  She is seen her PCP for dizziness and was started on meclizine twice daily x7 days.  With patient's increasing confusion she is not sure if she is taking her medications or not, so family was planning to start helping her with that this evening.  Patient does live alone.  She is normally able to ambulate on her  own, but since this generalized weakness has been progressing daughter his get the walker out for her again.  Of note patient lost her husband 2 years ago and has been depressed.  She does not talk to her PCP about this.  She has not been started on any antidepressant medications or any other new medications aside from the meclizine.  During my exam, patient does know her name, where she is, the year, the president.  She does not know why she is here, when trying to give history she reports that she called daughter, and daughter reports that that is not accurate.  She tries to guess at her complaints, but is clear that she is unsure, daughter reports that she has not had the complaints she is guessing at.  During her time the ER her blood pressure was very high at 220/89.  This is likely secondary to medication regimen nonadherence given her confusion, and the fact that she is unsure about when she last took her medications.  Patient does not smoke, does not drink alcohol.  Patient is vaccinated for COVID, which daughter confirms.  Patient remains full code.  In the ED Temp 98, respiratory 20, heart rate 96, blood pressure 220/89 No leukocytosis with a white blood cell count of 4.4, hemoglobin 13.8 Chemistry panel reveals hyponatremia 117, hyperglycemia at 123 Chest x-ray shows no cardiopulmonary acute disease EKG shows a heart rate of 101, sinus tach, QTC 442 ER gave patient's home medications diltiazem 60 mg p.o. and clonidine 0.2 mg which improved blood pressure to systolic 0000000 Normal saline was started on 100 mL/h Admission requested for further management of acute hyponatremia     Hospital Course:     Brief Admission History:  80 y.o.female,with history of mitral valve prolapse, hypertension, GERD, asthma, and more presents to the ED with a chief complaint of low sodium. Patient had presented to her PCP for bilateral arm tremors, generalized weakness, fatigue blood work was done  that showed a sodium of 117. Patient usually runs a slightly low between 126 and 132, but this was low even for her. Unfortunately patient is not able to give much history because she is confused. Daughter, [name], is at bedside and reports that patient has not been eating for last 3 weeks mostly due to ongoing depression from loss of husband 2 years ago.   Assessment & Plan:   Principal Problem:   Acute hyponatremia Active Problems:   Hypertensive crisis   Hypochloremia   Anxiety   Acute metabolic encephalopathy   Malnutrition of moderate degree   Anorexia  1)Severe HypoNatremia - sodium was 117 on 02/02/2021,  Na is up to 134 -- Pt is clinically feeling better.  This hyponatremia is presumably from longterm poor oral intake over several weeks, initially treated with IV fluid with normal saline infusion but on 02/07/21-- IV fluids stopped and lasix started.  -Discussed with Dr. Royce Macadamia for nephrology service, she recommended stopping Lasix and trying tolvaptan on 02/09/21 -Liberalize fluid intake  -Repeat BMP with PCP within a week  2)Metabolic encephalopathy - RESOLVED.  Mentation improved. -It was most likely due to #1 above  3)Hypertensive urgency - Bp much improved,  Change clonidine 0.2 mg patch -Continue Cardizem 360 daily, along with hydralazine 50 mg 3 times daily, continue metoprolol 25 mg twice daily  4)severe depression -since loss of husband couple of years ago,  -TTS reevaluated patient and no longer recommends inpatient geri-psych.  -- Patient is neither suicidal nor homicidal -Patient and family request that she did not go to [place] psych inpatient unit they will rather do outpatient behavioral therapy counseling   5)Anorexia -related to depression --Remeron may worsen hyponatremia and may increase his sleepiness -Use Megace instead for appetite stimulation CT chest abd pelvis to evaluate for underlying malignancy was negative for any acute findings.   6)GERD  -continue PPI  7)Meningioma - she was scheduled to see neurosurgery outpatient on Monday 4/18,  daughter to reschedule   neurosurgery appointment  8)AKI----acute kidney injury--creatinine normalized  avoid hypotension   Code Status: full  Family Communication: daughters Jackelyn Poling and [name]) updated at bedside disposition: home with home health   Consultants:   Nephrology /TTS   Discharge Condition: stable  Follow UP   [place] [place] Follow up.   Specialty: [place] Why:   RN/PT/SW - will call to schedule your first home visit.       Celene Squibb, MD. Schedule an appointment as soon as possible for a visit.   Specialty: Internal Medicine Why: Repeat CBC and BMP test within a week Contact information: [address] [place]  [address] [phone]        Arnoldo Lenis, MD .   Specialty: Cardiology Contact information: [place] [address] [phone]                Diet and Activity recommendation:  As advised  Discharge Instructions     Discharge Instructions    Call MD for:  difficulty breathing, headache or visual disturbances   Complete by: As directed    Call MD for:  persistant dizziness or light-headedness   Complete by: As directed    Call MD for:  persistant nausea and vomiting   Complete by: As directed    Call MD for:  temperature >100.4   Complete by: As directed    Diet - low sodium heart healthy   Complete by: As directed    Discharge instructions   Complete by: As directed    1)Stop Clonidine Tablets and use Clonidine patch once weekly (place a new one every Thursday) 2)Avoid ibuprofen/Advil/Aleve/Motrin/Goody Powders/Naproxen/BC powders/Meloxicam/Diclofenac/Indomethacin and other Nonsteroidal anti-inflammatory medications as these will make you more likely to bleed and can cause stomach ulcers, can also cause Kidney problems.  3) repeat CBC  and BMP blood test within a week with PCP advised 4) Ensure or boost nutritional supplements 2-3 times daily advised 5) outpatient follow-up with behavioral health counselor/psychologist/psychiatric team strongly advised   Increase activity slowly   Complete by: As directed  Discharge Medications     Allergies as of 02/11/2021      Reactions   Apple Other (See Comments)   Wheezing and Asthma Symptoms    Aspirin Other (See Comments)   Wheezing, Asthma Symptoms    Avelox [moxifloxacin Hcl In Nacl] Hives   Pt can take cipro   Bee Venom Other (See Comments)   Wheezing, Asthma Symptoms   Benadryl [diphenhydramine Hcl] Other (See Comments)   Wheezing, Asthma Symptoms    Chicken Allergy Other (See Comments)   Wheezing and Asthma Symptoms    Codeine Other (See Comments)   Makes patient feel like she's going to pass out.    Factive [gemifloxacin Mesylate] Hives   Factive [gemifloxacin] Hives   Hctz [hydrochlorothiazide]    hyponatremia   Iodinated Diagnostic Agents Other (See Comments)   Pt was told by allergist to avoid  Dye due to other allergies   Iodine    Levaquin [levofloxacin In D5w] Hives   Pt can take cipro   Levofloxacin Hives   Penicillins Other (See Comments)   Asthma Symptoms .Did it involve swelling of the face/tongue/throat, SOB, or low BP? No Did it involve sudden or severe rash/hives, skin peeling, or any reaction on the inside of your mouth or nose? Yes Did you need to seek medical attention at a hospital or doctor's office? Unknown When did it last happen?Unkown If all above answers are "NO", may proceed with cephalosporin use.   Sulfa Antibiotics Nausea Only   Tequin [gatifloxacin] Hives      Medication List    STOP taking these medications   ciprofloxacin 250 MG tablet Commonly known as: CIPRO   ciprofloxacin 500 MG tablet Commonly known as: CIPRO   cloNIDine 0.1 MG tablet Commonly known as: CATAPRES   fluocinonide cream 0.05  % Commonly known as: LIDEX   potassium chloride 10 MEQ tablet Commonly known as: KLOR-CON     TAKE these medications   acetaminophen 325 MG tablet Commonly known as: TYLENOL Take 2 tablets (650 mg total) by mouth every 6 (six) hours as needed for mild pain (or Fever >/= 101).   albuterol 108 (90 Base) MCG/ACT inhaler Commonly known as: VENTOLIN HFA Inhale 2 puffs into the lungs every 6 (six) hours as needed for wheezing or shortness of breath.   ALPRAZolam 0.25 MG tablet Commonly known as: XANAX Take 1 tablet by mouth 2 (two) times daily as needed for anxiety.   cloNIDine 0.2 mg/24hr patch Commonly known as: CATAPRES - Dosed in mg/24 hr Place 1 patch (0.2 mg total) onto the skin once a week. Every Thursday Start taking on: February 17, 2021   diltiazem 360 MG 24 hr capsule Commonly known as: CARDIZEM CD Take 360 mg by mouth daily.   esomeprazole 40 MG capsule Commonly known as: NEXIUM Take 40 mg by mouth daily before breakfast.   ezetimibe 10 MG tablet Commonly known as: ZETIA Take 10 mg by mouth daily.   feeding supplement Liqd Take 237 mLs by mouth 3 (three) times daily between meals.   Fluticasone-Salmeterol 250-50 MCG/DOSE Aepb Commonly known as: ADVAIR Inhale 1 puff into the lungs every 12 (twelve) hours.   furosemide 20 MG tablet Commonly known as: LASIX Take 1 tablet (20 mg total) by mouth as needed for edema (swelling).   guanFACINE 1 MG Tb24 ER tablet Commonly known as: INTUNIV Take 1 mg by mouth daily.   hydrALAZINE 50 MG tablet Commonly known as: APRESOLINE Take 1 tablet (50 mg total) by  mouth 3 (three) times daily. For BP What changed:   medication strength  how much to take  additional instructions   loteprednol 0.2 % Susp Commonly known as: LOTEMAX Place 1 drop into both eyes daily.   meclizine 25 MG tablet Commonly known as: ANTIVERT Take 1 tablet (25 mg total) by mouth 3 (three) times daily as needed for dizziness. What changed: when  to take this   megestrol 400 MG/10ML suspension Commonly known as: MEGACE Take 10 mLs (400 mg total) by mouth daily. For appetite stimulation   metoprolol tartrate 25 MG tablet Commonly known as: LOPRESSOR Take 1 tablet (25 mg total) by mouth 2 (two) times daily. What changed: how much to take   multivitamin-iron-minerals-folic acid chewable tablet Chew 1 tablet by mouth daily.   Netarsudil Dimesylate 0.02 % Soln Place 1 drop into both eyes every evening.   olmesartan 40 MG tablet Commonly known as: BENICAR Take 1 tablet by mouth daily.   ondansetron 4 MG tablet Commonly known as: ZOFRAN Take 1 tablet (4 mg total) by mouth every 6 (six) hours as needed for nausea.   senna-docusate 8.6-50 MG tablet Commonly known as: Senokot-S Take 2 tablets by mouth at bedtime.            Durable Medical Equipment  (From admission, onward)         Start     Ordered   02/04/21 1641  For home use only DME 3 n 1  Once        02/04/21 1640   02/04/21 1641  For home use only DME Walker rolling  Once       Question Answer Comment  Walker: With 5 Inch Wheels   Patient needs a walker to treat with the following condition Gait instability      02/04/21 1640          Major procedures and Radiology Reports - PLEASE review detailed and final reports for all details, in brief -    CT Angio Head W or Wo Contrast  Result Date: 01/18/2021 CLINICAL DATA:  Vertigo EXAM: CT ANGIOGRAPHY HEAD AND NECK TECHNIQUE: Multidetector CT imaging of the head and neck was performed using the standard protocol during bolus administration of intravenous contrast. Multiplanar CT image reconstructions and MIPs were obtained to evaluate the vascular anatomy. Carotid stenosis measurements (when applicable) are obtained utilizing NASCET criteria, using the distal internal carotid diameter as the denominator. CONTRAST:  47mL OMNIPAQUE IOHEXOL 350 MG/ML SOLN COMPARISON:  Brain MRI/MRA 01/17/2021 FINDINGS: CT HEAD  FINDINGS Brain: 4.8 cm anterior left convexity meningioma. Mild mass effect on the underlying brain without vasogenic edema. No hemorrhage or extra-axial collection. The size and configuration of the ventricles and extra-axial CSF spaces are normal. There is no acute or chronic infarction. The brain parenchyma is normal. Skull: The visualized skull base, calvarium and extracranial soft tissues are normal. Sinuses/Orbits: No fluid levels or advanced mucosal thickening of the visualized paranasal sinuses. No mastoid or middle ear effusion. The orbits are normal. CTA NECK FINDINGS SKELETON: There is no bony spinal canal stenosis. No lytic or blastic lesion. OTHER NECK: Normal pharynx, larynx and major salivary glands. No cervical lymphadenopathy. Unremarkable thyroid gland. UPPER CHEST: No pneumothorax or pleural effusion. No nodules or masses. AORTIC ARCH: There is calcific atherosclerosis of the aortic arch. There is no aneurysm, dissection or hemodynamically significant stenosis of the visualized portion of the aorta. Conventional 3 vessel aortic branching pattern. The visualized proximal subclavian arteries are widely patent.  RIGHT CAROTID SYSTEM: No dissection, occlusion or aneurysm. Mild atherosclerotic calcification at the carotid bifurcation without hemodynamically significant stenosis. LEFT CAROTID SYSTEM: No dissection, occlusion or aneurysm. There is mixed density atherosclerosis extending into the proximal ICA, resulting in less than 50% stenosis. VERTEBRAL ARTERIES: Left dominant configuration. Both origins are clearly patent. There is no dissection, occlusion or flow-limiting stenosis to the skull base (V1-V3 segments). CTA HEAD FINDINGS POSTERIOR CIRCULATION: --Vertebral arteries: Normal V4 segments. --Inferior cerebellar arteries: Normal. --Basilar artery: Normal. --Superior cerebellar arteries: Normal. --Posterior cerebral arteries (PCA): Normal. ANTERIOR CIRCULATION: --Intracranial internal carotid  arteries: Normal. --Anterior cerebral arteries (ACA): Normal. Both A1 segments are present. Patent anterior communicating artery (a-comm). --Middle cerebral arteries (MCA): Normal. VENOUS SINUSES: As permitted by contrast timing, patent. ANATOMIC VARIANTS: None Review of the MIP images confirms the above findings. IMPRESSION: 1. No aneurysm, emergent large vessel occlusion or high-grade stenosis of the intracranial arteries. 2. 4.8 cm anterior left convexity meningioma. 3. Bilateral carotid bifurcation atherosclerosis without hemodynamically significant stenosis by NASCET criteria. Aortic Atherosclerosis (ICD10-I70.0). Electronically Signed   By: Ulyses Jarred M.D.   On: 01/18/2021 01:24   CT Angio Neck W and/or Wo Contrast  Result Date: 01/18/2021 CLINICAL DATA:  Vertigo EXAM: CT ANGIOGRAPHY HEAD AND NECK TECHNIQUE: Multidetector CT imaging of the head and neck was performed using the standard protocol during bolus administration of intravenous contrast. Multiplanar CT image reconstructions and MIPs were obtained to evaluate the vascular anatomy. Carotid stenosis measurements (when applicable) are obtained utilizing NASCET criteria, using the distal internal carotid diameter as the denominator. CONTRAST:  61mL OMNIPAQUE IOHEXOL 350 MG/ML SOLN COMPARISON:  Brain MRI/MRA 01/17/2021 FINDINGS: CT HEAD FINDINGS Brain: 4.8 cm anterior left convexity meningioma. Mild mass effect on the underlying brain without vasogenic edema. No hemorrhage or extra-axial collection. The size and configuration of the ventricles and extra-axial CSF spaces are normal. There is no acute or chronic infarction. The brain parenchyma is normal. Skull: The visualized skull base, calvarium and extracranial soft tissues are normal. Sinuses/Orbits: No fluid levels or advanced mucosal thickening of the visualized paranasal sinuses. No mastoid or middle ear effusion. The orbits are normal. CTA NECK FINDINGS SKELETON: There is no bony spinal canal  stenosis. No lytic or blastic lesion. OTHER NECK: Normal pharynx, larynx and major salivary glands. No cervical lymphadenopathy. Unremarkable thyroid gland. UPPER CHEST: No pneumothorax or pleural effusion. No nodules or masses. AORTIC ARCH: There is calcific atherosclerosis of the aortic arch. There is no aneurysm, dissection or hemodynamically significant stenosis of the visualized portion of the aorta. Conventional 3 vessel aortic branching pattern. The visualized proximal subclavian arteries are widely patent. RIGHT CAROTID SYSTEM: No dissection, occlusion or aneurysm. Mild atherosclerotic calcification at the carotid bifurcation without hemodynamically significant stenosis. LEFT CAROTID SYSTEM: No dissection, occlusion or aneurysm. There is mixed density atherosclerosis extending into the proximal ICA, resulting in less than 50% stenosis. VERTEBRAL ARTERIES: Left dominant configuration. Both origins are clearly patent. There is no dissection, occlusion or flow-limiting stenosis to the skull base (V1-V3 segments). CTA HEAD FINDINGS POSTERIOR CIRCULATION: --Vertebral arteries: Normal V4 segments. --Inferior cerebellar arteries: Normal. --Basilar artery: Normal. --Superior cerebellar arteries: Normal. --Posterior cerebral arteries (PCA): Normal. ANTERIOR CIRCULATION: --Intracranial internal carotid arteries: Normal. --Anterior cerebral arteries (ACA): Normal. Both A1 segments are present. Patent anterior communicating artery (a-comm). --Middle cerebral arteries (MCA): Normal. VENOUS SINUSES: As permitted by contrast timing, patent. ANATOMIC VARIANTS: None Review of the MIP images confirms the above findings. IMPRESSION: 1. No aneurysm, emergent large vessel occlusion or high-grade  stenosis of the intracranial arteries. 2. 4.8 cm anterior left convexity meningioma. 3. Bilateral carotid bifurcation atherosclerosis without hemodynamically significant stenosis by NASCET criteria. Aortic Atherosclerosis (ICD10-I70.0).  Electronically Signed   By: Deatra Robinson M.D.   On: 01/18/2021 01:24   MR ANGIO HEAD WO CONTRAST  Result Date: 01/17/2021 CLINICAL DATA:  Dizziness.  Altered mental status and weakness. EXAM: MRI HEAD WITHOUT AND WITH CONTRAST MRA HEAD WITHOUT CONTRAST TECHNIQUE: Multiplanar, multiecho pulse sequences of the brain and surrounding structures were obtained without and with intravenous contrast. Angiographic images of the Circle of Willis were obtained using MRA technique without intravenous contrast. CONTRAST:  11mL GADAVIST GADOBUTROL 1 MMOL/ML IV SOLN COMPARISON:  MRI 11/01/2011.  CT head 11/08/2013. FINDINGS: MRI HEAD FINDINGS Brain: Significantly increased size of a large extra-axial, dural based, homogeneously mass along the left frontal convexity, measuring up to 3.8 by 4.2 x 3.3 cm (AP by transverse by craniocaudal). Smashed demonstrates mild homogeneous restricted diffusion, compatible cellularity. Resulting mass effect on the adjacent frontal lobe with slight rightward midline shift. No substantial surrounding edema. No acute infarct. No hydrocephalus. Patchy white matter hypoattenuation, most likely related to chronic microvascular ischemic disease. No acute hemorrhage. Vascular: Major arterial flow voids are maintained at the skull base. Skull and upper cervical spine: Normal marrow signal. Sinuses/Orbits: Evidence of prior endoscopic sinus surgery. Pansinus mucosal thickening with air-fluid levels in bilateral sphenoid sinuses. Other: No mastoid effusions. MRA HEAD FINDINGS Anterior circulation: Bilateral ICAs are patent. Bilateral M1 MCAs and proximal M2 MCAs are patent without evidence of proximal hemodynamically significant stenosis. Hypoplastic or absent right A1 ACA with the left A1 ACA supplying bilateral A2 ACAs. No evidence of hemodynamically significant proximal stenosis of the A2 ACAs evaluation distally is limited. Possible superiorly directed aneurysm of the right proximal cavernous ICA  versus tortuous vessel. Posterior circulation: Visualized intradural vertebral arteries and basilar artery are patent without evidence of hemodynamically significant stenosis. Bilateral PCAs are patent without evidence of hemodynamically significant stenosis. Right fetal type PCA. Possible small aneurysm in the region of the right P1/P2 PCA (see series 14, image 111). IMPRESSION: MRI: 1. Marked interval increase in size of a 4.2 cm left frontal convexity meningioma, as detailed above. Resulting mass effect on the adjacent frontal lobe with slight rightward midline shift. No substantial surrounding edema. 2. Otherwise, no evidence of acute abnormality.  No acute infarct. 3. Pansinus mucosal thickening with air-fluid levels in bilateral sphenoid sinuses. MRA: 1. Within limitation of noncontrast technique, no evidence of emergent large vessel occlusion or proximal hemodynamically significant stenosis. Limited evaluation of the distal vasculature. 2. Possible small aneurysms arising from the right P1/P2 PCA and right proximal cavernous ICA (versus tortuous vessels). Recommend follow-up CTA to further characterize. Findings and recommendations discussed with Ala Dach, [place] via telephone at 6:33 p.m. Electronically Signed   By: Feliberto Harts MD   On: 01/17/2021 19:16   MR BRAIN W WO CONTRAST  Result Date: 01/17/2021 CLINICAL DATA:  Dizziness.  Altered mental status and weakness. EXAM: MRI HEAD WITHOUT AND WITH CONTRAST MRA HEAD WITHOUT CONTRAST TECHNIQUE: Multiplanar, multiecho pulse sequences of the brain and surrounding structures were obtained without and with intravenous contrast. Angiographic images of the Circle of Willis were obtained using MRA technique without intravenous contrast. CONTRAST:  60mL GADAVIST GADOBUTROL 1 MMOL/ML IV SOLN COMPARISON:  MRI 11/01/2011.  CT head 11/08/2013. FINDINGS: MRI HEAD FINDINGS Brain: Significantly increased size of a large extra-axial, dural based, homogeneously mass along the  left frontal convexity, measuring up to  3.8 by 4.2 x 3.3 cm (AP by transverse by craniocaudal). Smashed demonstrates mild homogeneous restricted diffusion, compatible cellularity. Resulting mass effect on the adjacent frontal lobe with slight rightward midline shift. No substantial surrounding edema. No acute infarct. No hydrocephalus. Patchy white matter hypoattenuation, most likely related to chronic microvascular ischemic disease. No acute hemorrhage. Vascular: Major arterial flow voids are maintained at the skull base. Skull and upper cervical spine: Normal marrow signal. Sinuses/Orbits: Evidence of prior endoscopic sinus surgery. Pansinus mucosal thickening with air-fluid levels in bilateral sphenoid sinuses. Other: No mastoid effusions. MRA HEAD FINDINGS Anterior circulation: Bilateral ICAs are patent. Bilateral M1 MCAs and proximal M2 MCAs are patent without evidence of proximal hemodynamically significant stenosis. Hypoplastic or absent right A1 ACA with the left A1 ACA supplying bilateral A2 ACAs. No evidence of hemodynamically significant proximal stenosis of the A2 ACAs evaluation distally is limited. Possible superiorly directed aneurysm of the right proximal cavernous ICA versus tortuous vessel. Posterior circulation: Visualized intradural vertebral arteries and basilar artery are patent without evidence of hemodynamically significant stenosis. Bilateral PCAs are patent without evidence of hemodynamically significant stenosis. Right fetal type PCA. Possible small aneurysm in the region of the right P1/P2 PCA (see series 14, image 111). IMPRESSION: MRI: 1. Marked interval increase in size of a 4.2 cm left frontal convexity meningioma, as detailed above. Resulting mass effect on the adjacent frontal lobe with slight rightward midline shift. No substantial surrounding edema. 2. Otherwise, no evidence of acute abnormality.  No acute infarct. 3. Pansinus mucosal thickening with air-fluid levels in bilateral  sphenoid sinuses. MRA: 1. Within limitation of noncontrast technique, no evidence of emergent large vessel occlusion or proximal hemodynamically significant stenosis. Limited evaluation of the distal vasculature. 2. Possible small aneurysms arising from the right P1/P2 PCA and right proximal cavernous ICA (versus tortuous vessels). Recommend follow-up CTA to further characterize. Findings and recommendations discussed with Marijean Bravo, [place] via telephone at 6:33 p.m. Electronically Signed   By: Margaretha Sheffield MD   On: 01/17/2021 19:16   DG Chest Port 1 View  Result Date: 02/02/2021 CLINICAL DATA:  Shortness of breath. EXAM: PORTABLE CHEST 1 VIEW COMPARISON:  January 17, 2021. FINDINGS: The heart size and mediastinal contours are within normal limits. Stable right basilar scarring is noted. No acute pulmonary disease is noted. No pneumothorax or pleural effusion is noted. The visualized skeletal structures are unremarkable. IMPRESSION: No acute cardiopulmonary abnormality seen. Aortic Atherosclerosis (ICD10-I70.0). Electronically Signed   By: Marijo Conception M.D.   On: 02/02/2021 18:01   DG Chest Port 1 View  Result Date: 01/17/2021 CLINICAL DATA:  Weakness.  Dizziness EXAM: PORTABLE CHEST 1 VIEW COMPARISON:  Chest x-ray 11/28/2020, CT angiography 03/14/2011 FINDINGS: The heart size and mediastinal contours are unchanged. Aortic arch calcification. Biapical pleural/pulmonary scarring. 6 mm right mid lung zone nodularity. No focal consolidation. No pulmonary edema. No pleural effusion. No pneumothorax. No acute osseous abnormality. Large hiatal hernia. IMPRESSION: 1. 6 mm right mid lung zone nodularity. 2. Otherwise no acute cardiopulmonary abnormality. Electronically Signed   By: Iven Finn M.D.   On: 01/17/2021 18:58   CT CHEST ABDOMEN PELVIS WO CONTRAST  Result Date: 02/04/2021 CLINICAL DATA:  Anorexia. Jaundice. Weight loss rule out underlying malignancy. EXAM: CT CHEST, ABDOMEN AND PELVIS WITHOUT  CONTRAST TECHNIQUE: Multidetector CT imaging of the chest, abdomen and pelvis was performed following the standard protocol without IV contrast. COMPARISON:  CTA of the abdomen and pelvis on 08/04/2015 FINDINGS: CT CHEST FINDINGS Cardiovascular: Heart size is  normal. There is coronary artery calcification. Trace pericardial effusion. Pulmonary artery is UPPER limits normal. There is atherosclerotic calcification of the thoracic aorta, not associated with aneurysm. No displaced intimal calcifications. Mediastinum/Nodes: The visualized portion of the thyroid gland has a normal appearance. Esophagus is unremarkable. No significant mediastinal, hilar, or axillary adenopathy. Moderate hiatal hernia. Lungs/Pleura: Trace bilateral pleural effusions. Lungs are clear. Airways are patent. Musculoskeletal: Mild degenerative changes in the thoracic spine. There is mild anterior wedge deformity of T6, T7, and T8. Schmorl's nodes are present in the thoracic and lumbar spine. CT ABDOMEN PELVIS FINDINGS Hepatobiliary: Gallbladder is distended without CT evidence for stones. The liver is homogeneous. Pancreas: Unremarkable. No pancreatic ductal dilatation or surrounding inflammatory changes. Spleen: Normal in size without focal abnormality. Adrenals/Urinary Tract: Adrenal glands are normal. RIGHT kidney is unremarkable. RIGHT ureter is normal in appearance. LEFT kidney has decreased in size since previous CT in 2016. The etiology is likely dense atherosclerosis of the LEFT renal artery. No focal renal mass or hydronephrosis. No intrarenal or ureteral calculi. The bladder and visualized portion of the urethra are normal. Stomach/Bowel: Moderate hiatal hernia. Stomach is otherwise normal in appearance. Small bowel loops are normal in appearance. The appendix is well seen and has a normal appearance. Moderate stool burden. Numerous colonic diverticula without acute diverticulitis. Vascular/Lymphatic: There is dense atherosclerotic  calcification of the abdominal aorta not associated with aneurysm. Although involved by atherosclerosis, there is vascular opacification of the celiac axis, superior mesenteric artery, and inferior mesenteric artery. No retroperitoneal or mesenteric adenopathy. Reproductive: Small, partially calcified uterus.  No adnexal mass. Other: No ascites.  Anterior abdominal wall is unremarkable. Musculoskeletal: No acute abnormality. IMPRESSION: 1. No evidence for acute abnormality of the chest, abdomen, or pelvis. 2. Coronary artery disease. 3. Moderate hiatal hernia. 4. Trace bilateral pleural effusions. 5. Colonic diverticulosis without evidence for acute diverticulitis. 6. Moderate stool burden. 7. Normal appendix. 8. Small LEFT kidney, likely due to severe atherosclerotic disease of the LEFT renal artery. 9. Small, partially calcified uterus. 10. Mild anterior wedge compression fractures of T6, T7, and T8. 11. Aortic Atherosclerosis (ICD10-I70.0). Electronically Signed   By: Norva PavlovElizabeth  Brown M.D.   On: 02/04/2021 20:36    Micro Results   Recent Results (from the past 240 hour(s))  SARS CORONAVIRUS 2 (TAT 6-24 HRS) Nasopharyngeal Nasopharyngeal Swab     Status: None   Collection Time: 02/02/21  9:14 PM   Specimen: Nasopharyngeal Swab  Result Value Ref Range Status   SARS Coronavirus 2 NEGATIVE NEGATIVE Final    Comment: (NOTE) SARS-CoV-2 target nucleic acids are NOT DETECTED.  The SARS-CoV-2 RNA is generally detectable in upper and lower respiratory specimens during the acute phase of infection. Negative results do not preclude SARS-CoV-2 infection, do not rule out co-infections with other pathogens, and should not be used as the sole basis for treatment or other patient management decisions. Negative results must be combined with clinical observations, patient history, and epidemiological information. The expected result is Negative.  Fact Sheet for  Patients: HairSlick.nohttps://www.fda.gov/media/138098/download  Fact Sheet for Healthcare Providers: quierodirigir.comhttps://www.fda.gov/media/138095/download  This test is not yet approved or cleared by the [place] FDA and  has been authorized for detection and/or diagnosis of SARS-CoV-2 by FDA under an Emergency Use Authorization (EUA). This EUA will remain  in effect (meaning this test can be used) for the duration of the COVID-19 declaration under Se ction 564(b)(1) of the Act, 21 U.S.C. section 360bbb-3(b)(1), unless the authorization is terminated or revoked sooner.  Performed at [facility], [place] [address] [place], [place] [address]     Today   Subjective    Dorothee Stem today has no new complaints        Patient has been seen and examined prior to discharge   Objective   Blood pressure 134/66, pulse 66, temperature 98.7 F (37.1 C), resp. rate 20, height 5\' 4"  (1.626 m), weight 48.9 kg, SpO2 94 %.   Intake/Output Summary (Last 24 hours) at 02/11/2021 1117 Last data filed at 02/10/2021 1717 Gross per 24 hour  Intake 385.57 ml  Output 500 ml  Net -114.43 ml    Exam Gen:- Awake Alert, no acute distress  HEENT:- [place].AT, No sclera icterus Neck-Supple Neck,No JVD,.  Lungs-  CTAB , good air movement bilaterally  CV- S1, S2 normal, regular Abd-  +ve B.Sounds, Abd Soft, No tenderness,    Extremity/Skin:- No  edema,   good pulses Psych-affect is appropriate, oriented x3 Neuro-no new focal deficits, no tremors    Data Review   CBC w Diff:  Lab Results  Component Value Date   WBC 3.0 (L) 02/03/2021   HGB 11.8 (L) 02/03/2021   HCT 33.5 (L) 02/03/2021   PLT 223 02/03/2021   LYMPHOPCT 29 02/02/2021   MONOPCT 15 02/02/2021   EOSPCT 3 02/02/2021   BASOPCT 1 02/02/2021    CMP:  Lab Results  Component Value Date   NA 134 (L) 02/11/2021   K 4.1 02/11/2021   CL 100 02/11/2021   CO2 26 02/11/2021   BUN 22 02/11/2021   CREATININE 1.30 (H) 02/11/2021   CREATININE 1.13  (H) 06/30/2020   PROT 5.9 (L) 02/09/2021   ALBUMIN 2.9 (L) 02/11/2021   BILITOT 0.6 02/09/2021   ALKPHOS 52 02/09/2021   AST 24 02/09/2021   ALT 15 02/09/2021  .   Total Discharge time is about 33 minutes  Roxan Hockey M.D on 02/11/2021 at 11:17 AM  Go to [email or web address] -  for contact info  [facility] - Office  [phone]

## 2021-02-11 NOTE — TOC Transition Note (Signed)
Transition of Care [facility]) - CM/SW Discharge Note   Patient Details  Name: Stephanie West MRN: [id number] Date of Birth: [birth year]  [facility] CM/SW Contact:  Boneta Lucks, RN Phone Number: 02/11/2021, 11:29 AM   Clinical Narrative:   Patient discharging home. Updated Vaughan Basta with [place], MD placed orders.     Final next level of care: Home/Self Care Barriers to Discharge: Barriers Resolved   Patient Goals and CMS Choice Patient states their goals for this hospitalization and ongoing recovery are:: to go home CMS Medicare.gov Compare Post Acute Care list provided to:: Patient Choice offered to / list presented to : Patient  Discharge Placement        Patient and family notified of of transfer: 02/11/21  Discharge Plan and Services       HH Arranged: RN,PT,Social Work   Date [place]: 02/11/21 Time Welcome: 1129 Representative spoke with at [place]: Romualdo Bolk  Readmission Risk Interventions Readmission Risk Prevention Plan 02/11/2021  Transportation Screening Complete  PCP or Specialist Appt within 5-7 Days Complete  Home Care Screening Complete  Medication Review (RN CM) Complete  Some recent data might be hidden

## 2021-02-11 NOTE — Progress Notes (Signed)
Physical Therapy Treatment Patient Details Name: Stephanie West MRN: [id number] DOB: [birth year] Today's Date: 02/11/2021    History of Present Illness Stephanie West is an 80 y.o. female sent from her PCP's office due to low sodium level noticed on labs; repeat laps in ED noted Na of 117. Pt recently in ED on 01/17/21 for dizziness. PMH: HTN, mitral valve prolapse, GERD, and asthma.    PT Comments    Patient completes bed mobility without assist today. She transitions to seated EOB and demonstrates good sitting tolerance and sitting balance while completing seated exercises. Patient transfers to standing without AD with minimal to no unsteadiness upon standing. Patient unsteady for first several steps which then improves and she is able to ambulate increased distance without AD. Patient ends session seated in chair and is eager to return home. Patient will benefit from continued physical therapy in hospital and recommended venue below to increase strength, balance, endurance for safe ADLs and gait.    Follow Up Recommendations  Home health PT;Supervision - Intermittent;Supervision for mobility/OOB     Equipment Recommendations       Recommendations for Other Services       Precautions / Restrictions Precautions Precautions: Fall Restrictions Weight Bearing Restrictions: No    Mobility  Bed Mobility Overal bed mobility: Independent                  Transfers Overall transfer level: Modified independent Equipment used: None Transfers: Sit to/from Stand Sit to Stand: Supervision Stand pivot transfers: Supervision       General transfer comment: transfer to standing without AD, minimal to no unsteadiness upon standing  Ambulation/Gait Ambulation/Gait assistance: Min guard;Supervision Gait Distance (Feet): 300 Feet Assistive device: None Gait Pattern/deviations: Step-through pattern;Decreased stride length;Trunk flexed Gait velocity: slightly decreased    General Gait Details: Increased distance with no LOB or unsteadiness   Stairs     Stair Management: Two rails       Wheelchair Mobility    Modified Rankin (Stroke Patients Only)       Balance Overall balance assessment: Needs assistance Sitting-balance support: Feet supported Sitting balance-Leahy Scale: Normal Sitting balance - Comments: seated EOB   Standing balance support: During functional activity;No upper extremity supported Standing balance-Leahy Scale: Fair Standing balance comment: ambulates without RW                            Cognition Arousal/Alertness: Awake/alert Behavior During Therapy: WFL for tasks assessed/performed Overall Cognitive Status: Within Functional Limits for tasks assessed                                        Exercises General Exercises - Lower Extremity Ankle Circles/Pumps: AROM;Both;20 reps Long Arc Quad: Seated;AROM;Strengthening;Both;20 reps Hip Flexion/Marching: AROM;Both;Seated;20 reps Toe Raises: AROM;Strengthening;Both;10 reps;Seated Heel Raises: AROM;Both;10 reps;Seated    General Comments        Pertinent Vitals/Pain Pain Assessment: No/denies pain    Home Living                      Prior Function            PT Goals (current goals can now be found in the care plan section) Acute Rehab PT Goals Patient Stated Goal: Return to prior level of function. PT Goal Formulation: With patient Time For Goal Achievement: 02/17/21  Potential to Achieve Goals: Fair Progress towards PT goals: Progressing toward goals    Frequency    Min 3X/week      PT Plan Current plan remains appropriate    Co-evaluation              AM-PAC PT "6 Clicks" Mobility   Outcome Measure  Help needed turning from your back to your side while in a flat bed without using bedrails?: None Help needed moving from lying on your back to sitting on the side of a flat bed without using  bedrails?: None Help needed moving to and from a bed to a chair (including a wheelchair)?: None Help needed standing up from a chair using your arms (e.g., wheelchair or bedside chair)?: A Little Help needed to walk in hospital room?: A Little Help needed climbing 3-5 steps with a railing? : A Little 6 Click Score: 21    End of Session Equipment Utilized During Treatment: Gait belt Activity Tolerance: Patient tolerated treatment well Patient left: in chair;with call bell/phone within reach Nurse Communication: Mobility status PT Visit Diagnosis: Unsteadiness on feet (R26.81);Other abnormalities of gait and mobility (R26.89);Muscle weakness (generalized) (M62.81)     Time: 0973-5329 PT Time Calculation (min) (ACUTE ONLY): 12 min  Charges:  $Therapeutic Activity: 8-22 mins                     10:36 AM, 02/11/21 Mearl Latin PT, DPT Physical Therapist at [facility]

## 2021-02-12 DIAGNOSIS — F419 Anxiety disorder, unspecified: Secondary | ICD-10-CM | POA: Diagnosis not present

## 2021-02-12 DIAGNOSIS — N179 Acute kidney failure, unspecified: Secondary | ICD-10-CM | POA: Diagnosis not present

## 2021-02-12 DIAGNOSIS — I169 Hypertensive crisis, unspecified: Secondary | ICD-10-CM | POA: Diagnosis not present

## 2021-02-12 DIAGNOSIS — R1310 Dysphagia, unspecified: Secondary | ICD-10-CM | POA: Diagnosis not present

## 2021-02-12 DIAGNOSIS — R630 Anorexia: Secondary | ICD-10-CM | POA: Diagnosis not present

## 2021-02-12 DIAGNOSIS — I341 Nonrheumatic mitral (valve) prolapse: Secondary | ICD-10-CM | POA: Diagnosis not present

## 2021-02-12 DIAGNOSIS — E43 Unspecified severe protein-calorie malnutrition: Secondary | ICD-10-CM | POA: Diagnosis not present

## 2021-02-12 DIAGNOSIS — I10 Essential (primary) hypertension: Secondary | ICD-10-CM | POA: Diagnosis not present

## 2021-02-12 DIAGNOSIS — E871 Hypo-osmolality and hyponatremia: Secondary | ICD-10-CM | POA: Diagnosis not present

## 2021-02-12 DIAGNOSIS — J45909 Unspecified asthma, uncomplicated: Secondary | ICD-10-CM | POA: Diagnosis not present

## 2021-02-12 DIAGNOSIS — K219 Gastro-esophageal reflux disease without esophagitis: Secondary | ICD-10-CM | POA: Diagnosis not present

## 2021-02-12 DIAGNOSIS — D329 Benign neoplasm of meninges, unspecified: Secondary | ICD-10-CM | POA: Diagnosis not present

## 2021-02-12 DIAGNOSIS — F32A Depression, unspecified: Secondary | ICD-10-CM | POA: Diagnosis not present

## 2021-02-14 DIAGNOSIS — N179 Acute kidney failure, unspecified: Secondary | ICD-10-CM | POA: Diagnosis not present

## 2021-02-14 DIAGNOSIS — I10 Essential (primary) hypertension: Secondary | ICD-10-CM | POA: Diagnosis not present

## 2021-02-14 DIAGNOSIS — I169 Hypertensive crisis, unspecified: Secondary | ICD-10-CM | POA: Diagnosis not present

## 2021-02-14 DIAGNOSIS — E43 Unspecified severe protein-calorie malnutrition: Secondary | ICD-10-CM | POA: Diagnosis not present

## 2021-02-14 DIAGNOSIS — R630 Anorexia: Secondary | ICD-10-CM | POA: Diagnosis not present

## 2021-02-14 DIAGNOSIS — E871 Hypo-osmolality and hyponatremia: Secondary | ICD-10-CM | POA: Diagnosis not present

## 2021-02-17 DIAGNOSIS — R630 Anorexia: Secondary | ICD-10-CM | POA: Diagnosis not present

## 2021-02-17 DIAGNOSIS — I10 Essential (primary) hypertension: Secondary | ICD-10-CM | POA: Diagnosis not present

## 2021-02-17 DIAGNOSIS — E871 Hypo-osmolality and hyponatremia: Secondary | ICD-10-CM | POA: Diagnosis not present

## 2021-02-17 DIAGNOSIS — E43 Unspecified severe protein-calorie malnutrition: Secondary | ICD-10-CM | POA: Diagnosis not present

## 2021-02-17 DIAGNOSIS — N179 Acute kidney failure, unspecified: Secondary | ICD-10-CM | POA: Diagnosis not present

## 2021-02-17 DIAGNOSIS — I169 Hypertensive crisis, unspecified: Secondary | ICD-10-CM | POA: Diagnosis not present

## 2021-02-19 DIAGNOSIS — E871 Hypo-osmolality and hyponatremia: Secondary | ICD-10-CM | POA: Diagnosis not present

## 2021-02-19 DIAGNOSIS — I10 Essential (primary) hypertension: Secondary | ICD-10-CM | POA: Diagnosis not present

## 2021-02-19 DIAGNOSIS — R630 Anorexia: Secondary | ICD-10-CM | POA: Diagnosis not present

## 2021-02-19 DIAGNOSIS — E43 Unspecified severe protein-calorie malnutrition: Secondary | ICD-10-CM | POA: Diagnosis not present

## 2021-02-19 DIAGNOSIS — N179 Acute kidney failure, unspecified: Secondary | ICD-10-CM | POA: Diagnosis not present

## 2021-02-19 DIAGNOSIS — I169 Hypertensive crisis, unspecified: Secondary | ICD-10-CM | POA: Diagnosis not present

## 2021-02-21 DIAGNOSIS — E785 Hyperlipidemia, unspecified: Secondary | ICD-10-CM | POA: Diagnosis not present

## 2021-02-21 DIAGNOSIS — G9389 Other specified disorders of brain: Secondary | ICD-10-CM | POA: Diagnosis not present

## 2021-02-21 DIAGNOSIS — R000 Tachycardia, unspecified: Secondary | ICD-10-CM | POA: Diagnosis not present

## 2021-02-21 DIAGNOSIS — R7303 Prediabetes: Secondary | ICD-10-CM | POA: Diagnosis not present

## 2021-02-21 DIAGNOSIS — H20813 Fuchs' heterochromic cyclitis, bilateral: Secondary | ICD-10-CM | POA: Diagnosis not present

## 2021-02-21 DIAGNOSIS — R42 Dizziness and giddiness: Secondary | ICD-10-CM | POA: Diagnosis not present

## 2021-02-21 DIAGNOSIS — R630 Anorexia: Secondary | ICD-10-CM | POA: Diagnosis not present

## 2021-02-21 DIAGNOSIS — E876 Hypokalemia: Secondary | ICD-10-CM | POA: Diagnosis not present

## 2021-02-21 DIAGNOSIS — N179 Acute kidney failure, unspecified: Secondary | ICD-10-CM | POA: Diagnosis not present

## 2021-02-21 DIAGNOSIS — I169 Hypertensive crisis, unspecified: Secondary | ICD-10-CM | POA: Diagnosis not present

## 2021-02-21 DIAGNOSIS — E871 Hypo-osmolality and hyponatremia: Secondary | ICD-10-CM | POA: Diagnosis not present

## 2021-02-21 DIAGNOSIS — E43 Unspecified severe protein-calorie malnutrition: Secondary | ICD-10-CM | POA: Diagnosis not present

## 2021-02-21 DIAGNOSIS — I10 Essential (primary) hypertension: Secondary | ICD-10-CM | POA: Diagnosis not present

## 2021-02-21 DIAGNOSIS — E870 Hyperosmolality and hypernatremia: Secondary | ICD-10-CM | POA: Diagnosis not present

## 2021-02-21 DIAGNOSIS — K219 Gastro-esophageal reflux disease without esophagitis: Secondary | ICD-10-CM | POA: Diagnosis not present

## 2021-02-21 DIAGNOSIS — E875 Hyperkalemia: Secondary | ICD-10-CM | POA: Diagnosis not present

## 2021-02-21 DIAGNOSIS — R2240 Localized swelling, mass and lump, unspecified lower limb: Secondary | ICD-10-CM | POA: Diagnosis not present

## 2021-02-21 DIAGNOSIS — N1832 Chronic kidney disease, stage 3b: Secondary | ICD-10-CM | POA: Diagnosis not present

## 2021-03-02 DIAGNOSIS — N179 Acute kidney failure, unspecified: Secondary | ICD-10-CM | POA: Diagnosis not present

## 2021-03-02 DIAGNOSIS — R630 Anorexia: Secondary | ICD-10-CM | POA: Diagnosis not present

## 2021-03-02 DIAGNOSIS — E43 Unspecified severe protein-calorie malnutrition: Secondary | ICD-10-CM | POA: Diagnosis not present

## 2021-03-02 DIAGNOSIS — I10 Essential (primary) hypertension: Secondary | ICD-10-CM | POA: Diagnosis not present

## 2021-03-02 DIAGNOSIS — I169 Hypertensive crisis, unspecified: Secondary | ICD-10-CM | POA: Diagnosis not present

## 2021-03-02 DIAGNOSIS — E871 Hypo-osmolality and hyponatremia: Secondary | ICD-10-CM | POA: Diagnosis not present

## 2021-03-04 DIAGNOSIS — I10 Essential (primary) hypertension: Secondary | ICD-10-CM | POA: Diagnosis not present

## 2021-03-04 DIAGNOSIS — N1832 Chronic kidney disease, stage 3b: Secondary | ICD-10-CM | POA: Diagnosis not present

## 2021-03-04 DIAGNOSIS — E782 Mixed hyperlipidemia: Secondary | ICD-10-CM | POA: Diagnosis not present

## 2021-03-04 DIAGNOSIS — R0609 Other forms of dyspnea: Secondary | ICD-10-CM | POA: Diagnosis not present

## 2021-03-07 DIAGNOSIS — I10 Essential (primary) hypertension: Secondary | ICD-10-CM | POA: Diagnosis not present

## 2021-03-07 DIAGNOSIS — N179 Acute kidney failure, unspecified: Secondary | ICD-10-CM | POA: Diagnosis not present

## 2021-03-07 DIAGNOSIS — R630 Anorexia: Secondary | ICD-10-CM | POA: Diagnosis not present

## 2021-03-07 DIAGNOSIS — E43 Unspecified severe protein-calorie malnutrition: Secondary | ICD-10-CM | POA: Diagnosis not present

## 2021-03-07 DIAGNOSIS — E871 Hypo-osmolality and hyponatremia: Secondary | ICD-10-CM | POA: Diagnosis not present

## 2021-03-07 DIAGNOSIS — I169 Hypertensive crisis, unspecified: Secondary | ICD-10-CM | POA: Diagnosis not present

## 2021-03-10 DIAGNOSIS — J029 Acute pharyngitis, unspecified: Secondary | ICD-10-CM | POA: Diagnosis not present

## 2021-03-14 DIAGNOSIS — E871 Hypo-osmolality and hyponatremia: Secondary | ICD-10-CM | POA: Diagnosis not present

## 2021-03-14 DIAGNOSIS — R630 Anorexia: Secondary | ICD-10-CM | POA: Diagnosis not present

## 2021-03-14 DIAGNOSIS — K219 Gastro-esophageal reflux disease without esophagitis: Secondary | ICD-10-CM | POA: Diagnosis not present

## 2021-03-14 DIAGNOSIS — N179 Acute kidney failure, unspecified: Secondary | ICD-10-CM | POA: Diagnosis not present

## 2021-03-14 DIAGNOSIS — R1310 Dysphagia, unspecified: Secondary | ICD-10-CM | POA: Diagnosis not present

## 2021-03-14 DIAGNOSIS — I169 Hypertensive crisis, unspecified: Secondary | ICD-10-CM | POA: Diagnosis not present

## 2021-03-14 DIAGNOSIS — F419 Anxiety disorder, unspecified: Secondary | ICD-10-CM | POA: Diagnosis not present

## 2021-03-14 DIAGNOSIS — E43 Unspecified severe protein-calorie malnutrition: Secondary | ICD-10-CM | POA: Diagnosis not present

## 2021-03-14 DIAGNOSIS — F32A Depression, unspecified: Secondary | ICD-10-CM | POA: Diagnosis not present

## 2021-03-14 DIAGNOSIS — I341 Nonrheumatic mitral (valve) prolapse: Secondary | ICD-10-CM | POA: Diagnosis not present

## 2021-03-14 DIAGNOSIS — D329 Benign neoplasm of meninges, unspecified: Secondary | ICD-10-CM | POA: Diagnosis not present

## 2021-03-14 DIAGNOSIS — J45909 Unspecified asthma, uncomplicated: Secondary | ICD-10-CM | POA: Diagnosis not present

## 2021-03-14 DIAGNOSIS — I10 Essential (primary) hypertension: Secondary | ICD-10-CM | POA: Diagnosis not present

## 2021-03-15 DIAGNOSIS — N179 Acute kidney failure, unspecified: Secondary | ICD-10-CM | POA: Diagnosis not present

## 2021-03-15 DIAGNOSIS — E43 Unspecified severe protein-calorie malnutrition: Secondary | ICD-10-CM | POA: Diagnosis not present

## 2021-03-15 DIAGNOSIS — I169 Hypertensive crisis, unspecified: Secondary | ICD-10-CM | POA: Diagnosis not present

## 2021-03-15 DIAGNOSIS — E871 Hypo-osmolality and hyponatremia: Secondary | ICD-10-CM | POA: Diagnosis not present

## 2021-03-15 DIAGNOSIS — I10 Essential (primary) hypertension: Secondary | ICD-10-CM | POA: Diagnosis not present

## 2021-03-15 DIAGNOSIS — R630 Anorexia: Secondary | ICD-10-CM | POA: Diagnosis not present

## 2021-03-24 DIAGNOSIS — I10 Essential (primary) hypertension: Secondary | ICD-10-CM | POA: Diagnosis not present

## 2021-03-24 DIAGNOSIS — E43 Unspecified severe protein-calorie malnutrition: Secondary | ICD-10-CM | POA: Diagnosis not present

## 2021-03-24 DIAGNOSIS — E871 Hypo-osmolality and hyponatremia: Secondary | ICD-10-CM | POA: Diagnosis not present

## 2021-03-24 DIAGNOSIS — N179 Acute kidney failure, unspecified: Secondary | ICD-10-CM | POA: Diagnosis not present

## 2021-03-24 DIAGNOSIS — R630 Anorexia: Secondary | ICD-10-CM | POA: Diagnosis not present

## 2021-03-24 DIAGNOSIS — I169 Hypertensive crisis, unspecified: Secondary | ICD-10-CM | POA: Diagnosis not present

## 2021-04-01 DIAGNOSIS — E871 Hypo-osmolality and hyponatremia: Secondary | ICD-10-CM | POA: Diagnosis not present

## 2021-04-01 DIAGNOSIS — E43 Unspecified severe protein-calorie malnutrition: Secondary | ICD-10-CM | POA: Diagnosis not present

## 2021-04-01 DIAGNOSIS — I10 Essential (primary) hypertension: Secondary | ICD-10-CM | POA: Diagnosis not present

## 2021-04-01 DIAGNOSIS — R630 Anorexia: Secondary | ICD-10-CM | POA: Diagnosis not present

## 2021-04-01 DIAGNOSIS — N179 Acute kidney failure, unspecified: Secondary | ICD-10-CM | POA: Diagnosis not present

## 2021-04-01 DIAGNOSIS — I169 Hypertensive crisis, unspecified: Secondary | ICD-10-CM | POA: Diagnosis not present

## 2021-04-08 DIAGNOSIS — E871 Hypo-osmolality and hyponatremia: Secondary | ICD-10-CM | POA: Diagnosis not present

## 2021-04-08 DIAGNOSIS — E43 Unspecified severe protein-calorie malnutrition: Secondary | ICD-10-CM | POA: Diagnosis not present

## 2021-04-08 DIAGNOSIS — I10 Essential (primary) hypertension: Secondary | ICD-10-CM | POA: Diagnosis not present

## 2021-04-08 DIAGNOSIS — N179 Acute kidney failure, unspecified: Secondary | ICD-10-CM | POA: Diagnosis not present

## 2021-04-08 DIAGNOSIS — I169 Hypertensive crisis, unspecified: Secondary | ICD-10-CM | POA: Diagnosis not present

## 2021-04-08 DIAGNOSIS — R630 Anorexia: Secondary | ICD-10-CM | POA: Diagnosis not present

## 2021-05-05 DIAGNOSIS — R7303 Prediabetes: Secondary | ICD-10-CM | POA: Diagnosis not present

## 2021-05-05 DIAGNOSIS — Z1329 Encounter for screening for other suspected endocrine disorder: Secondary | ICD-10-CM | POA: Diagnosis not present

## 2021-05-05 DIAGNOSIS — I10 Essential (primary) hypertension: Secondary | ICD-10-CM | POA: Diagnosis not present

## 2021-05-10 DIAGNOSIS — K219 Gastro-esophageal reflux disease without esophagitis: Secondary | ICD-10-CM | POA: Diagnosis not present

## 2021-05-10 DIAGNOSIS — R7303 Prediabetes: Secondary | ICD-10-CM | POA: Diagnosis not present

## 2021-05-10 DIAGNOSIS — J45909 Unspecified asthma, uncomplicated: Secondary | ICD-10-CM | POA: Diagnosis not present

## 2021-05-10 DIAGNOSIS — N1832 Chronic kidney disease, stage 3b: Secondary | ICD-10-CM | POA: Diagnosis not present

## 2021-05-10 DIAGNOSIS — J0100 Acute maxillary sinusitis, unspecified: Secondary | ICD-10-CM | POA: Diagnosis not present

## 2021-05-10 DIAGNOSIS — R000 Tachycardia, unspecified: Secondary | ICD-10-CM | POA: Diagnosis not present

## 2021-05-10 DIAGNOSIS — E876 Hypokalemia: Secondary | ICD-10-CM | POA: Diagnosis not present

## 2021-05-10 DIAGNOSIS — J019 Acute sinusitis, unspecified: Secondary | ICD-10-CM | POA: Diagnosis not present

## 2021-05-10 DIAGNOSIS — E782 Mixed hyperlipidemia: Secondary | ICD-10-CM | POA: Diagnosis not present

## 2021-05-10 DIAGNOSIS — I10 Essential (primary) hypertension: Secondary | ICD-10-CM | POA: Diagnosis not present

## 2021-05-10 DIAGNOSIS — H20813 Fuchs' heterochromic cyclitis, bilateral: Secondary | ICD-10-CM | POA: Diagnosis not present

## 2021-05-20 ENCOUNTER — Telehealth: Payer: Self-pay

## 2021-05-20 NOTE — Telephone Encounter (Signed)
Error

## 2021-06-13 DIAGNOSIS — H9202 Otalgia, left ear: Secondary | ICD-10-CM | POA: Diagnosis not present

## 2021-06-16 ENCOUNTER — Ambulatory Visit (INDEPENDENT_AMBULATORY_CARE_PROVIDER_SITE_OTHER): Payer: Medicare Other | Admitting: Gastroenterology

## 2021-06-16 ENCOUNTER — Other Ambulatory Visit: Payer: Self-pay

## 2021-06-16 ENCOUNTER — Encounter (INDEPENDENT_AMBULATORY_CARE_PROVIDER_SITE_OTHER): Payer: Self-pay | Admitting: Gastroenterology

## 2021-06-16 VITALS — BP 180/76 | HR 64 | Temp 97.6°F | Ht 63.0 in | Wt 111.3 lb

## 2021-06-16 DIAGNOSIS — K5904 Chronic idiopathic constipation: Secondary | ICD-10-CM | POA: Diagnosis not present

## 2021-06-16 DIAGNOSIS — K5900 Constipation, unspecified: Secondary | ICD-10-CM | POA: Insufficient documentation

## 2021-06-16 DIAGNOSIS — K219 Gastro-esophageal reflux disease without esophagitis: Secondary | ICD-10-CM

## 2021-06-16 MED ORDER — ESOMEPRAZOLE MAGNESIUM 40 MG PO CPDR: 40.0000 mg | DELAYED_RELEASE_CAPSULE | Freq: Every day | ORAL | 3 refills | Status: DC

## 2021-06-16 NOTE — Patient Instructions (Signed)
Continue Nexium 40 mg qday Start taking Miralax 1 capful every day for one week. If bowel movements do not improve, increase to 1 capful every 12 hours. If after two weeks there is no improvement, increase to 1 capful every 8 hours

## 2021-06-16 NOTE — Progress Notes (Signed)
Maylon Peppers, M.D. Gastroenterology & Hepatology [facility] [address] [place], [place] [address]  Primary Care Physician: Celene Squibb, MD [place] [address]  I will communicate my assessment and recommendations to the referring MD via EMR.  Problems: GERD  History of Present Illness: ROBBIN MONTAGNA is a 80 y.o. female with past medical history of GERD, asthma, hypertension, who presents for follow up of GERD.  The patient was last seen on 06/15/2020. At that time, the patient was advised to continue taking Nexium 40 mg every day.  She was also advised to take MiraLAX or Colace for management of constipation.  Patient reports that she has been taking the Nexium 40 mg qday in AM, waits 1.5 hours to have breakfast. States she takes it compliantly and it has completely controlled her heartburn episodes as she does not have any heartburn events.  She reports feeling she has finally achieved control of her symptoms with the current medication and dosage. No odynophagia or dysphagia. Has never tried to go down to a lower PPI dose.  Has a bowel movement every 3 days. She is not taking any medications at the moment. She would like to move them more frequently as she has significant straing.  The patient denies having any nausea, vomiting, fever, chills, hematochezia, melena, hematemesis, abdominal distention, abdominal pain, diarrhea, jaundice, pruritus or weight loss.  Last EGD: 2016 - High-grade Schatzki's ring which was disrupted with combination of scope balloon dilation and focal biopsy. Moderate-sized sliding hiatal hernia. Nonerosive antral gastritis. Biopsy taken. Last Colonoscopy: believes 5 years ago but no reports available, not interested in pursuing any more colonoscopies unless strictly necessary  Past Medical History: Past Medical History:  Diagnosis Date   Asthma    Dysphagia 05/10/2017    Elevated transaminase level    GERD (gastroesophageal reflux disease)    Hypertension    MVP (mitral valve prolapse)     Past Surgical History: Past Surgical History:  Procedure Laterality Date   COLONOSCOPY     ESOPHAGEAL DILATION  07/30/2015   Procedure: ESOPHAGEAL DILATION;  Surgeon: Rogene Houston, MD;  [facility];  Service: Endoscopy;;   ESOPHAGOGASTRODUODENOSCOPY N/A 04/15/2015   Procedure: ESOPHAGOGASTRODUODENOSCOPY (EGD);  Surgeon: Rogene Houston, MD;  [facility];  Service: Endoscopy;  Laterality: N/A;  125 - moved to 1:00, Ann to notify pt   ESOPHAGOGASTRODUODENOSCOPY N/A 07/30/2015   Procedure: ESOPHAGOGASTRODUODENOSCOPY (EGD);  Surgeon: Rogene Houston, MD;  [facility];  Service: Endoscopy;  Laterality: N/A;  1040   ESOPHAGOGASTRODUODENOSCOPY (EGD) WITH ESOPHAGEAL DILATION N/A 03/07/2013   Procedure: ESOPHAGOGASTRODUODENOSCOPY (EGD) WITH ESOPHAGEAL DILATION;  Surgeon: Rogene Houston, MD;  [facility];  Service: Endoscopy;  Laterality: N/A;  830   EYE SURGERY     LAPAROSCOPIC TUBAL LIGATION  1984   UPPER GASTROINTESTINAL ENDOSCOPY      Family History: Family History  Problem Relation Age of Onset   Hypertension Mother    Prostate cancer Father     Social History: Social History   Tobacco Use  Smoking Status Never  Smokeless Tobacco Never   Social History   Substance and Sexual Activity  Alcohol Use No   Alcohol/week: 0.0 standard drinks   Social History   Substance and Sexual Activity  Drug Use No    Allergies: Allergies  Allergen Reactions   Apple Other (See Comments)    Wheezing and Asthma  Symptoms    Aspirin Other (See Comments)    Wheezing, Asthma Symptoms    Avelox [Moxifloxacin Hcl In Nacl] Hives    Pt can take cipro   Bee Venom Other (See Comments)    Wheezing, Asthma Symptoms   Benadryl [Diphenhydramine Hcl] Other (See Comments)    Wheezing, Asthma Symptoms    Chicken Allergy Other (See  Comments)    Wheezing and Asthma Symptoms    Codeine Other (See Comments)    Makes patient feel like she's going to pass out.    Factive [Gemifloxacin Mesylate] Hives   Factive [Gemifloxacin] Hives   Hctz [Hydrochlorothiazide]     hyponatremia   Iodinated Diagnostic Agents Other (See Comments)    Pt was told by allergist to avoid  Dye due to other allergies   Iodine    Levaquin [Levofloxacin In D5w] Hives    Pt can take cipro   Levofloxacin Hives   Penicillins Other (See Comments)    Asthma Symptoms .Did it involve swelling of the face/tongue/throat, SOB, or low BP? No Did it involve sudden or severe rash/hives, skin peeling, or any reaction on the inside of your mouth or nose? Yes Did you need to seek medical attention at a hospital or doctor's office? Unknown When did it last happen?      Unkown If all above answers are "NO", may proceed with cephalosporin use.    Sulfa Antibiotics Nausea Only   Tequin [Gatifloxacin] Hives    Medications: Current Outpatient Medications  Medication Sig Dispense Refill   acetaminophen (TYLENOL) 325 MG tablet Take 2 tablets (650 mg total) by mouth every 6 (six) hours as needed for mild pain (or Fever >/= 101). 12 tablet 0   albuterol (PROVENTIL HFA;VENTOLIN HFA) 108 (90 BASE) MCG/ACT inhaler Inhale 2 puffs into the lungs every 6 (six) hours as needed for wheezing or shortness of breath.     ALPRAZolam (XANAX) 0.25 MG tablet Take 1 tablet by mouth 2 (two) times daily as needed for anxiety.     cloNIDine (CATAPRES - DOSED IN MG/24 HR) 0.2 mg/24hr patch Place 1 patch (0.2 mg total) onto the skin once a week. Every Thursday 4 patch 12   diltiazem (CARDIZEM CD) 360 MG 24 hr capsule Take 360 mg by mouth daily.     esomeprazole (NEXIUM) 40 MG capsule Take 40 mg by mouth daily before breakfast.      ezetimibe (ZETIA) 10 MG tablet Take 10 mg by mouth daily.     feeding supplement (ENSURE ENLIVE / ENSURE PLUS) LIQD Take 237 mLs by mouth 3 (three) times daily  between meals. 237 mL 12   guanFACINE (INTUNIV) 1 MG TB24 ER tablet Take 1 mg by mouth daily.     hydrALAZINE (APRESOLINE) 50 MG tablet Take 1 tablet (50 mg total) by mouth 3 (three) times daily. For BP 90 tablet 2   loteprednol (LOTEMAX) 0.2 % SUSP Place 1 drop into both eyes daily.      meclizine (ANTIVERT) 25 MG tablet Take 1 tablet (25 mg total) by mouth 3 (three) times daily as needed for dizziness. (Patient taking differently: Take 25 mg by mouth daily as needed for dizziness.) 30 tablet 0   metoprolol tartrate (LOPRESSOR) 25 MG tablet Take 1 tablet (25 mg total) by mouth 2 (two) times daily. 90 tablet 3   multivitamin-iron-minerals-folic acid (CENTRUM) chewable tablet Chew 1 tablet by mouth daily.     Netarsudil Dimesylate 0.02 % SOLN Place 1 drop into both eyes  every evening.     olmesartan (BENICAR) 40 MG tablet Take 1 tablet by mouth daily.     ondansetron (ZOFRAN) 4 MG tablet Take 1 tablet (4 mg total) by mouth every 6 (six) hours as needed for nausea. 20 tablet 0   senna-docusate (SENOKOT-S) 8.6-50 MG tablet Take 2 tablets by mouth at bedtime. (Patient not taking: Reported on 06/16/2021) 60 tablet 2   No current facility-administered medications for this visit.    Review of Systems: GENERAL: negative for malaise, night sweats HEENT: No changes in hearing or vision, no nose bleeds or other nasal problems. NECK: Negative for lumps, goiter, pain and significant neck swelling RESPIRATORY: Negative for cough, wheezing CARDIOVASCULAR: Negative for chest pain, leg swelling, palpitations, orthopnea GI: SEE HPI MUSCULOSKELETAL: Negative for joint pain or swelling, back pain, and muscle pain. SKIN: Negative for lesions, rash PSYCH: Negative for sleep disturbance, mood disorder and recent psychosocial stressors. HEMATOLOGY Negative for prolonged bleeding, bruising easily, and swollen nodes. ENDOCRINE: Negative for cold or heat intolerance, polyuria, polydipsia and goiter. NEURO: negative  for tremor, gait imbalance, syncope and seizures. The remainder of the review of systems is noncontributory.   Physical Exam: BP (!) 180/76 (BP Location: Left Arm, Patient Position: Sitting, Cuff Size: Normal)   Pulse 64   Temp 97.6 F (36.4 C) (Oral)   Ht '5\' 3"'$  (1.6 m)   Wt 111 lb 4.8 oz (50.5 kg)   BMI 19.72 kg/m  GENERAL: The patient is AO x3, in no acute distress. HEENT: Head is normocephalic and atraumatic. EOMI are intact. Mouth is well hydrated and without lesions. NECK: Supple. No masses LUNGS: Clear to auscultation. No presence of rhonchi/wheezing/rales. Adequate chest expansion HEART: RRR, normal s1 and s2. ABDOMEN: Soft, nontender, no guarding, no peritoneal signs, and nondistended. BS +. No masses. EXTREMITIES: Without any cyanosis, clubbing, rash, lesions or edema. NEUROLOGIC: AOx3, no focal motor deficit. SKIN: no jaundice, no rashes  Imaging/Labs: as above  I personally reviewed and interpreted the available labs, imaging and endoscopic files.  Impression and Plan: BEREA NOGUERAS is a 80 y.o. female with past medical history of GERD, asthma, hypertension, who presents for follow up of GERD.  The patient has presented adequate control of her GERD while taking her current dose of PPI.  I inquired about the possibility of decreasing the dose to 20 mg every day but she would like to continue on the current dose as she is fearful her symptoms may recur with a lower dose.  I find this reasonable, she will be continued on the same dosage for now.  Regarding her constipation, she has presented recurrent episodes of constipation but is currently not on any medication.  I will start her on MiraLAX every day and she can titrate up as needed.  Patient understood and agreed.  - Continue Nexium 40 mg qday - Start taking Miralax 1 capful every day for one week. If bowel movements do not improve, increase to 1 capful every 12 hours. If after two weeks there is no improvement,  increase to 1 capful every 8 hours  All questions were answered.      Harvel Quale, MD Gastroenterology and Hepatology [facility]

## 2021-06-28 DIAGNOSIS — H9202 Otalgia, left ear: Secondary | ICD-10-CM | POA: Diagnosis not present

## 2021-07-25 DIAGNOSIS — H9202 Otalgia, left ear: Secondary | ICD-10-CM | POA: Diagnosis not present

## 2021-08-10 DIAGNOSIS — H9202 Otalgia, left ear: Secondary | ICD-10-CM | POA: Diagnosis not present

## 2021-08-10 DIAGNOSIS — I10 Essential (primary) hypertension: Secondary | ICD-10-CM | POA: Diagnosis not present

## 2021-08-10 DIAGNOSIS — R7303 Prediabetes: Secondary | ICD-10-CM | POA: Diagnosis not present

## 2021-08-17 ENCOUNTER — Other Ambulatory Visit: Payer: Self-pay | Admitting: Cardiology

## 2021-08-17 ENCOUNTER — Telehealth: Payer: Self-pay

## 2021-08-17 DIAGNOSIS — E782 Mixed hyperlipidemia: Secondary | ICD-10-CM | POA: Diagnosis not present

## 2021-08-17 DIAGNOSIS — N1832 Chronic kidney disease, stage 3b: Secondary | ICD-10-CM | POA: Diagnosis not present

## 2021-08-17 DIAGNOSIS — E876 Hypokalemia: Secondary | ICD-10-CM | POA: Diagnosis not present

## 2021-08-17 DIAGNOSIS — J45909 Unspecified asthma, uncomplicated: Secondary | ICD-10-CM | POA: Diagnosis not present

## 2021-08-17 DIAGNOSIS — H20813 Fuchs' heterochromic cyclitis, bilateral: Secondary | ICD-10-CM | POA: Diagnosis not present

## 2021-08-17 DIAGNOSIS — I10 Essential (primary) hypertension: Secondary | ICD-10-CM | POA: Diagnosis not present

## 2021-08-17 DIAGNOSIS — R7303 Prediabetes: Secondary | ICD-10-CM | POA: Diagnosis not present

## 2021-08-17 DIAGNOSIS — H9202 Otalgia, left ear: Secondary | ICD-10-CM | POA: Diagnosis not present

## 2021-08-17 DIAGNOSIS — K219 Gastro-esophageal reflux disease without esophagitis: Secondary | ICD-10-CM | POA: Diagnosis not present

## 2021-08-17 DIAGNOSIS — J0100 Acute maxillary sinusitis, unspecified: Secondary | ICD-10-CM | POA: Diagnosis not present

## 2021-08-17 DIAGNOSIS — R000 Tachycardia, unspecified: Secondary | ICD-10-CM | POA: Diagnosis not present

## 2021-08-17 DIAGNOSIS — J019 Acute sinusitis, unspecified: Secondary | ICD-10-CM | POA: Diagnosis not present

## 2021-08-17 MED ORDER — HYDRALAZINE HCL 50 MG PO TABS: 50.0000 mg | ORAL_TABLET | Freq: Three times a day (TID) | ORAL | 2 refills | Status: DC

## 2021-08-17 NOTE — Telephone Encounter (Signed)
Pharmacy sent refill request for d/c Hydralazine 25 mg tablets TID. Refused medication. Sending in correct dosage per patient chart- Hydralazine 50 mg tablets TID

## 2021-09-06 DIAGNOSIS — H9202 Otalgia, left ear: Secondary | ICD-10-CM | POA: Diagnosis not present

## 2021-09-09 ENCOUNTER — Telehealth: Payer: Self-pay | Admitting: Cardiology

## 2021-09-09 DIAGNOSIS — Z23 Encounter for immunization: Secondary | ICD-10-CM | POA: Diagnosis not present

## 2021-09-09 NOTE — Telephone Encounter (Signed)
Pt c/o medication issue:  1. Name of Medication: hydrALAZINE (APRESOLINE) 50 MG tablet  2. How are you currently taking this medication (dosage and times per day)? Take 1 tablet (25 mg total) by mouth 3 (three) times daily. For BP  3. Are you having a reaction (difficulty breathing--STAT)? no  4. What is your medication issue? Pt is needing clarification on how to take this medication since dosage increase.. pt is only 35 ibs, daughter is concerned that mom may be taking too much medication.

## 2021-09-09 NOTE — Telephone Encounter (Signed)
Need more information on how home BP readings are looking. She has actually been prescribed higher dose of hydralazine 50mg  TID since at least April of this year which is the dose currently listed on her med list. Most recent BP from GI in August showed very elevated BP of 180/76, readings typically have been very elevated in the office ranging 386-854 systolic. Hydralazine is not dosed based on weight, appropriate dosing will be determined based on her home BP reading control. Med can be dosed up to 100mg  TID if needed.

## 2021-09-09 NOTE — Telephone Encounter (Signed)
I will  forward to Pharm-D

## 2021-09-12 NOTE — Telephone Encounter (Signed)
I spoke with daughter,Cheryl. She states patient does not check her BP at home, has a neighbor that comes comes over sometimes. They recently went to the pcp and had BP of 130/80. They plan to stay on hydralazine 25 mg TID until f/u visit on 09/26/21

## 2021-09-26 ENCOUNTER — Other Ambulatory Visit: Payer: Self-pay

## 2021-09-26 ENCOUNTER — Encounter: Payer: Self-pay | Admitting: Cardiology

## 2021-09-26 ENCOUNTER — Ambulatory Visit (INDEPENDENT_AMBULATORY_CARE_PROVIDER_SITE_OTHER): Payer: Medicare Other | Admitting: Cardiology

## 2021-09-26 VITALS — BP 140/42 | HR 74 | Ht 64.0 in | Wt 114.6 lb

## 2021-09-26 DIAGNOSIS — I10 Essential (primary) hypertension: Secondary | ICD-10-CM | POA: Diagnosis not present

## 2021-09-26 DIAGNOSIS — R002 Palpitations: Secondary | ICD-10-CM

## 2021-09-26 MED ORDER — HYDRALAZINE HCL 50 MG PO TABS
50.0000 mg | ORAL_TABLET | Freq: Three times a day (TID) | ORAL | 3 refills | Status: DC
Start: 2021-09-26 — End: 2021-10-03

## 2021-09-26 MED ORDER — HYDRALAZINE HCL 50 MG PO TABS: 50.0000 mg | ORAL_TABLET | Freq: Three times a day (TID) | ORAL | 3 refills | Status: DC

## 2021-09-26 NOTE — Progress Notes (Signed)
Clinical Summary Ms. Tramel is a 80 y.o.female seen today for follow up of the following medical problems.    1. HTN - white coat HTN, bp's in clinic not reflective of her true bp's.  - difficult to manage bp due to side effects - hyponatremia on HCTZ. Prior episode, recently retreid on HCTZ with recurrent hyponatremia. Resolved with stopping - LE edema on norvasc 10, lowered to 5 and had done well but recentlty with LE edema - has historically been on clonidine and guanfacine, both alpha 2 agonists. SIgnifiacnt fatigue last visit, we weaned clonicine to 0.1mg  tid with improved symptoms.  - has been on dilt 360 and olmesartan without side effects.    Prior visit - home bp's 118-150/58-78 - fatigue is improving with weanign of clonidine - significant LE edema on norvasc 5 and with stopping HCTZ recently for hyponatremia.        - at 01/2021 admission was to change to clonidine patch 0.2mg , cardizem 360mg  daily, and hydral 50mg  tid.   - home bps 140s-160s/40s-80     2. Palpitations - 7 day event monitor showed SR and sinus tach - palpitations in early AM around 4 to 5 AM, corresponds on monitor to mild sinus tach - stakes her long acting dilt at night time.      - no recent symptoms    3.Hyponatremia  - admit 01/2021, thought secondary to poor oral intake.  - has had prior issues with low sodiums in the past, at one point secondary to diuretics.  Past Medical History:  Diagnosis Date   Asthma    Dysphagia 05/10/2017   Elevated transaminase level    GERD (gastroesophageal reflux disease)    Hypertension    MVP (mitral valve prolapse)      Allergies  Allergen Reactions   Apple Other (See Comments)    Wheezing and Asthma Symptoms    Aspirin Other (See Comments)    Wheezing, Asthma Symptoms    Avelox [Moxifloxacin Hcl In Nacl] Hives    Pt can take cipro   Bee Venom Other (See Comments)    Wheezing, Asthma Symptoms   Benadryl [Diphenhydramine Hcl] Other (See  Comments)    Wheezing, Asthma Symptoms    Chicken Allergy Other (See Comments)    Wheezing and Asthma Symptoms    Codeine Other (See Comments)    Makes patient feel like she's going to pass out.    Factive [Gemifloxacin Mesylate] Hives   Factive [Gemifloxacin] Hives   Hctz [Hydrochlorothiazide]     hyponatremia   Iodinated Diagnostic Agents Other (See Comments)    Pt was told by allergist to avoid  Dye due to other allergies   Iodine    Levaquin [Levofloxacin In D5w] Hives    Pt can take cipro   Levofloxacin Hives   Penicillins Other (See Comments)    Asthma Symptoms .Did it involve swelling of the face/tongue/throat, SOB, or low BP? No Did it involve sudden or severe rash/hives, skin peeling, or any reaction on the inside of your mouth or nose? Yes Did you need to seek medical attention at a hospital or doctor's office? Unknown When did it last happen?      Unkown If all above answers are "NO", may proceed with cephalosporin use.    Sulfa Antibiotics Nausea Only   Tequin [Gatifloxacin] Hives     Current Outpatient Medications  Medication Sig Dispense Refill   acetaminophen (TYLENOL) 325 MG tablet Take 2 tablets (650 mg total)  by mouth every 6 (six) hours as needed for mild pain (or Fever >/= 101). 12 tablet 0   albuterol (PROVENTIL HFA;VENTOLIN HFA) 108 (90 BASE) MCG/ACT inhaler Inhale 2 puffs into the lungs every 6 (six) hours as needed for wheezing or shortness of breath.     ALPRAZolam (XANAX) 0.25 MG tablet Take 1 tablet by mouth 2 (two) times daily as needed for anxiety.     cloNIDine (CATAPRES - DOSED IN MG/24 HR) 0.2 mg/24hr patch Place 1 patch (0.2 mg total) onto the skin once a week. Every Thursday 4 patch 12   diltiazem (CARDIZEM CD) 360 MG 24 hr capsule Take 360 mg by mouth daily.     esomeprazole (NEXIUM) 40 MG capsule Take 1 capsule (40 mg total) by mouth daily before breakfast. 90 capsule 3   ezetimibe (ZETIA) 10 MG tablet Take 10 mg by mouth daily.     feeding  supplement (ENSURE ENLIVE / ENSURE PLUS) LIQD Take 237 mLs by mouth 3 (three) times daily between meals. 237 mL 12   guanFACINE (INTUNIV) 1 MG TB24 ER tablet Take 1 mg by mouth daily.     hydrALAZINE (APRESOLINE) 50 MG tablet Take 1 tablet (50 mg total) by mouth 3 (three) times daily. For BP 90 tablet 2   loteprednol (LOTEMAX) 0.2 % SUSP Place 1 drop into both eyes daily.      meclizine (ANTIVERT) 25 MG tablet Take 1 tablet (25 mg total) by mouth 3 (three) times daily as needed for dizziness. (Patient taking differently: Take 25 mg by mouth daily as needed for dizziness.) 30 tablet 0   metoprolol tartrate (LOPRESSOR) 25 MG tablet Take 1 tablet (25 mg total) by mouth 2 (two) times daily. 90 tablet 3   multivitamin-iron-minerals-folic acid (CENTRUM) chewable tablet Chew 1 tablet by mouth daily.     Netarsudil Dimesylate 0.02 % SOLN Place 1 drop into both eyes every evening.     olmesartan (BENICAR) 40 MG tablet Take 1 tablet by mouth daily.     ondansetron (ZOFRAN) 4 MG tablet Take 1 tablet (4 mg total) by mouth every 6 (six) hours as needed for nausea. 20 tablet 0   senna-docusate (SENOKOT-S) 8.6-50 MG tablet Take 2 tablets by mouth at bedtime. (Patient not taking: Reported on 06/16/2021) 60 tablet 2   No current facility-administered medications for this visit.     Past Surgical History:  Procedure Laterality Date   COLONOSCOPY     ESOPHAGEAL DILATION  07/30/2015   Procedure: ESOPHAGEAL DILATION;  Surgeon: Rogene Houston, MD;  [facility];  Service: Endoscopy;;   ESOPHAGOGASTRODUODENOSCOPY N/A 04/15/2015   Procedure: ESOPHAGOGASTRODUODENOSCOPY (EGD);  Surgeon: Rogene Houston, MD;  [facility];  Service: Endoscopy;  Laterality: N/A;  125 - moved to 1:00, Ann to notify pt   ESOPHAGOGASTRODUODENOSCOPY N/A 07/30/2015   Procedure: ESOPHAGOGASTRODUODENOSCOPY (EGD);  Surgeon: Rogene Houston, MD;  [facility];  Service: Endoscopy;  Laterality: N/A;  1040    ESOPHAGOGASTRODUODENOSCOPY (EGD) WITH ESOPHAGEAL DILATION N/A 03/07/2013   Procedure: ESOPHAGOGASTRODUODENOSCOPY (EGD) WITH ESOPHAGEAL DILATION;  Surgeon: Rogene Houston, MD;  [facility];  Service: Endoscopy;  Laterality: N/A;  830   EYE SURGERY     LAPAROSCOPIC TUBAL LIGATION  1984   UPPER GASTROINTESTINAL ENDOSCOPY       Allergies  Allergen Reactions   Apple Other (See Comments)    Wheezing and Asthma Symptoms    Aspirin Other (See Comments)    Wheezing,  Asthma Symptoms    Avelox [Moxifloxacin Hcl In Nacl] Hives    Pt can take cipro   Bee Venom Other (See Comments)    Wheezing, Asthma Symptoms   Benadryl [Diphenhydramine Hcl] Other (See Comments)    Wheezing, Asthma Symptoms    Chicken Allergy Other (See Comments)    Wheezing and Asthma Symptoms    Codeine Other (See Comments)    Makes patient feel like she's going to pass out.    Factive [Gemifloxacin Mesylate] Hives   Factive [Gemifloxacin] Hives   Hctz [Hydrochlorothiazide]     hyponatremia   Iodinated Diagnostic Agents Other (See Comments)    Pt was told by allergist to avoid  Dye due to other allergies   Iodine    Levaquin [Levofloxacin In D5w] Hives    Pt can take cipro   Levofloxacin Hives   Penicillins Other (See Comments)    Asthma Symptoms .Did it involve swelling of the face/tongue/throat, SOB, or low BP? No Did it involve sudden or severe rash/hives, skin peeling, or any reaction on the inside of your mouth or nose? Yes Did you need to seek medical attention at a hospital or doctor's office? Unknown When did it last happen?      Unkown If all above answers are "NO", may proceed with cephalosporin use.    Sulfa Antibiotics Nausea Only   Tequin [Gatifloxacin] Hives      Family History  Problem Relation Age of Onset   Hypertension Mother    Prostate cancer Father      Social History Ms. Otani reports that she has never smoked. She has never used smokeless tobacco. Ms. Acocella  reports no history of alcohol use.   Review of Systems CONSTITUTIONAL: No weight loss, fever, chills, weakness or fatigue.  HEENT: Eyes: No visual loss, blurred vision, double vision or yellow sclerae.No hearing loss, sneezing, congestion, runny nose or sore throat.  SKIN: No rash or itching.  CARDIOVASCULAR: per hpi RESPIRATORY: No shortness of breath, cough or sputum.  GASTROINTESTINAL: No anorexia, nausea, vomiting or diarrhea. No abdominal pain or blood.  GENITOURINARY: No burning on urination, no polyuria NEUROLOGICAL: No headache, dizziness, syncope, paralysis, ataxia, numbness or tingling in the extremities. No change in bowel or bladder control.  MUSCULOSKELETAL: No muscle, back pain, joint pain or stiffness.  LYMPHATICS: No enlarged nodes. No history of splenectomy.  PSYCHIATRIC: No history of depression or anxiety.  ENDOCRINOLOGIC: No reports of sweating, cold or heat intolerance. No polyuria or polydipsia.  Marland Kitchen   Physical Examination Today's Vitals   09/26/21 1544  BP: (!) 140/42  Pulse: 74  SpO2: 98%  Weight: 114 lb 9.6 oz (52 kg)  Height: 5\' 4"  (1.626 m)   Body mass index is 19.67 kg/m.  Gen: resting comfortably, no acute distress HEENT: no scleral icterus, pupils equal round and reactive, no palptable cervical adenopathy,  CV: RRR, no m/r/g, no jvd Resp: Clear to auscultation bilaterally GI: abdomen is soft, non-tender, non-distended, normal bowel sounds, no hepatosplenomegaly MSK: extremities are warm, no edema.  Skin: warm, no rash Neuro:  no focal deficits Psych: appropriate affect   Diagnostic Studies  05/2020 event monitor 7 day event monitor Min HR 49, Avg HR 65, Max HR 119 Reported symptoms correlate with sinus rhythm and sinus tachycardia No significant arrhythmias   Assessment and Plan  1. HTN - complex history as reproted above, multi drug regimen with prior side effects - history of white coat HTN, office numbers not reflective of overall  control - manural repeat toady 150/65 - home numbers remain above goal, will increase hydralazine to 50mg  tid         2. Palpitations -doing well, continue current meds     Arnoldo Lenis, M.D.,

## 2021-09-26 NOTE — Patient Instructions (Signed)
Medication Instructions:  Your physician has recommended you make the following change in your medication:  INCREASE Hydralazine to 50 mg three times daily  *If you need a refill on your cardiac medications before your next appointment, please call your pharmacy*   Lab Work: None If you have labs (blood work) drawn today and your tests are completely normal, you will receive your results only by: [place] (if you have MyChart) OR A paper copy in the mail If you have any lab test that is abnormal or we need to change your treatment, we will call you to review the results.   Testing/Procedures: None   Follow-Up: At [facility], you and your health needs are our priority.  As part of our continuing mission to provide you with exceptional heart care, we have created designated Provider Care Teams.  These Care Teams include your primary Cardiologist (physician) and Advanced Practice Providers (APPs -  Physician Assistants and Nurse Practitioners) who all work together to provide you with the care you need, when you need it.  We recommend signing up for the patient portal called "MyChart".  Sign up information is provided on this After Visit Summary.  MyChart is used to connect with patients for Virtual Visits (Telemedicine).  Patients are able to view lab/test results, encounter notes, upcoming appointments, etc.  Non-urgent messages can be sent to your provider as well.   To learn more about what you can do with MyChart, go to [email or web address].    Your next appointment:   6 month(s)  The format for your next appointment:   In Person  Provider:   Carlyle Dolly, MD    Other Instructions Call us in one week with an update to your blood pressure log.

## 2021-09-26 NOTE — Addendum Note (Signed)
Addended by: Berlinda Last on: 09/26/2021 04:29 PM   Modules accepted: Orders

## 2021-10-03 ENCOUNTER — Telehealth: Payer: Self-pay

## 2021-10-03 DIAGNOSIS — I10 Essential (primary) hypertension: Secondary | ICD-10-CM

## 2021-10-03 MED ORDER — HYDRALAZINE HCL 25 MG PO TABS: 37.5000 mg | ORAL_TABLET | Freq: Three times a day (TID) | ORAL | 3 refills | Status: DC

## 2021-10-03 NOTE — Telephone Encounter (Signed)
Can we try lowering hydralazine to 37.5mg  tid. Can we refer her to HTN clinic, given the complexity of her bp's we need additonal expertise to try to control it   Zandra Abts MD

## 2021-10-03 NOTE — Telephone Encounter (Signed)
Pt and daughter walked into office today to speak with Dr. Harl Bowie.   Pt stated that since starting Hydralazine 50 mg TID, she has been experiencing headaches with dizziness. Pt also stated that the blood vessels in her eyes had popped. Pt stated she went to see ophthalmologist about this and was told this is a bp problem. Pt denies SOB. Pt took bp at home last night: 162/70. Rechecked pt's bp while she was here; 162/72.   Please advise.

## 2021-10-03 NOTE — Telephone Encounter (Signed)
Pt notified and verbalized understanding. Referral entered for HTN Clinic

## 2021-10-20 ENCOUNTER — Telehealth: Payer: Self-pay

## 2021-10-20 ENCOUNTER — Encounter: Payer: Self-pay | Admitting: Pharmacist Clinician (PhC)/ Clinical Pharmacy Specialist

## 2021-10-20 ENCOUNTER — Ambulatory Visit (INDEPENDENT_AMBULATORY_CARE_PROVIDER_SITE_OTHER): Payer: Medicare Other | Admitting: Pharmacist Clinician (PhC)/ Clinical Pharmacy Specialist

## 2021-10-20 ENCOUNTER — Other Ambulatory Visit: Payer: Self-pay

## 2021-10-20 DIAGNOSIS — I10 Essential (primary) hypertension: Secondary | ICD-10-CM | POA: Insufficient documentation

## 2021-10-20 MED ORDER — CARVEDILOL 6.25 MG PO TABS: 6.2500 mg | ORAL_TABLET | Freq: Two times a day (BID) | ORAL | 3 refills | Status: DC

## 2021-10-20 NOTE — Assessment & Plan Note (Signed)
Patient with probably white coat hypertension superimposed on essential hypertension.  She is currently on 6 medications, still not at goal.  Would recommend that her goal be < 140/90 as she is a somewhat frail elderly lady.  She cannot tolerate amlodipine or diuretics and is on all other available classes at highest tolerated doses.  Asked that she continue with neighbor RN BP checks, and write down those readings.  Hopefully they are lower when she is in her own home and chatting with the neighbor.  Her pressure did drop significantly by the end of her visit with me today, so we will continue with her current medications and see her back in 3 months for follow up

## 2021-10-20 NOTE — Progress Notes (Signed)
10/20/2021 Stephanie West 1941/05/06 [id number]   HPI:  Stephanie West is a 80 y.o. female patient of Dr Carlyle Dolly,  who presents today for hypertension clinic evaluation.  In addition to hypertension, her medical history is significant for palpitations and prior hyponatremia (thought to be secondary to poor oral intake)/  When she saw Dr. Harl Bowie earlier this month her pressure was recorded at 140/42.  He noted she has some measure of white coat hypertension.  Patient states today that she has been hypertensive for around 20 years.  She states that the idea of checking her BP makes it go up, regardless of who is checking it.  She no longer has a home cuff, but her neighbor comes over 2-3 times each week and checks it for her.  Does not recall any readings.     Blood Pressure Goal:  140/80 (due to age, significant number of medications already on)  Current Medications: clonidine 0.2 mg patch, diltiazem 360 mg, guanfacine 1 mg qd, hydralazine 25 mg tid, metoprolol 25 mg bid, olmesartan 40 mg qd  Family Hx: mother had hx of htn, died at 25; father died prostate cancer at 18; no siblings; no biological children  Social Hx: no, no, 1 7.5 mg oz Coke daily  Diet: mostly sandwiches for lunch 1/2 lunch meat mostly, some PB&J , potato chips; toast w/cheese in morning; dinner sandwich 1/2 or whole ; rare  frutis or vegetables  Exercise: none  Home BP readings:   Intolerances: amlodipine - edema, hctz - hyponatremia  Labs: 4/22:  Na 134, K 4.1, Glu 88, BUN 22, SCr 1.3, GFR 41   Wt Readings from Last 3 Encounters:  10/20/21 111 lb 3.2 oz (50.4 kg)  09/26/21 114 lb 9.6 oz (52 kg)  06/16/21 111 lb 4.8 oz (50.5 kg)   BP Readings from Last 3 Encounters:  10/20/21 (!) 156/72  09/26/21 (!) 140/42  06/16/21 (!) 180/76   Pulse Readings from Last 3 Encounters:  10/20/21 65  09/26/21 74  06/16/21 64    Current Outpatient Medications  Medication Sig Dispense Refill    acetaminophen (TYLENOL) 325 MG tablet Take 2 tablets (650 mg total) by mouth every 6 (six) hours as needed for mild pain (or Fever >/= 101). 12 tablet 0   carvedilol (COREG) 6.25 MG tablet Take 1 tablet (6.25 mg total) by mouth 2 (two) times daily. 180 tablet 3   cloNIDine (CATAPRES - DOSED IN MG/24 HR) 0.2 mg/24hr patch Place 1 patch (0.2 mg total) onto the skin once a week. Every Thursday 4 patch 12   diltiazem (CARDIZEM CD) 360 MG 24 hr capsule Take 360 mg by mouth daily.     esomeprazole (NEXIUM) 40 MG capsule Take 1 capsule (40 mg total) by mouth daily before breakfast. 90 capsule 3   ezetimibe (ZETIA) 10 MG tablet Take 10 mg by mouth daily.     feeding supplement (ENSURE ENLIVE / ENSURE PLUS) LIQD Take 237 mLs by mouth 3 (three) times daily between meals. 237 mL 12   guanFACINE (INTUNIV) 1 MG TB24 ER tablet Take 1 mg by mouth daily.     hydrALAZINE (APRESOLINE) 25 MG tablet Take 1.5 tablets (37.5 mg total) by mouth 3 (three) times daily. 270 tablet 3   levocetirizine (XYZAL) 5 MG tablet Take 5 mg by mouth at bedtime.     loteprednol (LOTEMAX) 0.2 % SUSP Place 1 drop into both eyes daily.      multivitamin-iron-minerals-folic acid (  CENTRUM) chewable tablet Chew 1 tablet by mouth daily.     Netarsudil Dimesylate 0.02 % SOLN Place 1 drop into both eyes every evening.     olmesartan (BENICAR) 40 MG tablet Take 1 tablet by mouth daily.     ondansetron (ZOFRAN) 4 MG tablet Take 1 tablet (4 mg total) by mouth every 6 (six) hours as needed for nausea. 20 tablet 0   polyethylene glycol powder (GLYCOLAX/MIRALAX) 17 GM/SCOOP powder Take 1 Container by mouth as needed for mild constipation.     potassium citrate (UROCIT-K) 10 MEQ (1080 MG) SR tablet Take 10 mEq by mouth 3 (three) times daily with meals.     albuterol (PROVENTIL HFA;VENTOLIN HFA) 108 (90 BASE) MCG/ACT inhaler Inhale 2 puffs into the lungs every 6 (six) hours as needed for wheezing or shortness of breath. (Patient not taking: Reported on  10/20/2021)     ALPRAZolam (XANAX) 0.25 MG tablet Take 1 tablet by mouth 2 (two) times daily as needed for anxiety. (Patient not taking: Reported on 10/20/2021)     meclizine (ANTIVERT) 25 MG tablet Take 1 tablet (25 mg total) by mouth 3 (three) times daily as needed for dizziness. (Patient not taking: Reported on 10/20/2021) 30 tablet 0   No current facility-administered medications for this visit.    Allergies  Allergen Reactions   Apple Other (See Comments)    Wheezing and Asthma Symptoms    Aspirin Other (See Comments)    Wheezing, Asthma Symptoms    Avelox [Moxifloxacin Hcl In Nacl] Hives    Pt can take cipro   Bee Venom Other (See Comments)    Wheezing, Asthma Symptoms   Benadryl [Diphenhydramine Hcl] Other (See Comments)    Wheezing, Asthma Symptoms    Chicken Allergy Other (See Comments)    Wheezing and Asthma Symptoms    Codeine Other (See Comments)    Makes patient feel like she's going to pass out.    Factive [Gemifloxacin Mesylate] Hives   Factive [Gemifloxacin] Hives   Hctz [Hydrochlorothiazide]     hyponatremia   Iodinated Contrast Media Other (See Comments)    Pt was told by allergist to avoid  Dye due to other allergies   Iodine    Levaquin [Levofloxacin In D5w] Hives    Pt can take cipro   Levofloxacin Hives   Penicillins Other (See Comments)    Asthma Symptoms .Did it involve swelling of the face/tongue/throat, SOB, or low BP? No Did it involve sudden or severe rash/hives, skin peeling, or any reaction on the inside of your mouth or nose? Yes Did you need to seek medical attention at a hospital or doctor's office? Unknown When did it last happen?      Unkown If all above answers are NO, may proceed with cephalosporin use.    Sulfa Antibiotics Nausea Only   Tequin [Gatifloxacin] Hives    Past Medical History:  Diagnosis Date   Asthma    Dysphagia 05/10/2017   Elevated transaminase level    GERD (gastroesophageal reflux disease)    Hypertension     MVP (mitral valve prolapse)     Blood pressure (!) 156/72, pulse 65, resp. rate 17, height 5\' 4"  (1.626 m), weight 111 lb 3.2 oz (50.4 kg), SpO2 95 %.  Hypertension Patient with probably white coat hypertension superimposed on essential hypertension.  She is currently on 6 medications, still not at goal.  Would recommend that her goal be < 140/90 as she is a somewhat frail elderly lady.  She cannot tolerate amlodipine or diuretics and is on all other available classes at highest tolerated doses.  Asked that she continue with neighbor RN BP checks, and write down those readings.  Hopefully they are lower when she is in her own home and chatting with the neighbor.  Her pressure did drop significantly by the end of her visit with me today, so we will continue with her current medications and see her back in 3 months for follow up   Tommy Medal PharmD CPP [place] [address] [place], [place] [address] [phone]

## 2021-10-20 NOTE — Telephone Encounter (Signed)
Called to r/s since a pharmd came down w/covid

## 2021-10-20 NOTE — Patient Instructions (Addendum)
Return for a a follow up appointment Monday March 27 ay 10 am  When your neighbor nurse checks your BP, have her write down the results  If you have any problems, please reach out to [place] at [phone]  Take your BP meds as follows:  Stop metoprolol.  Start carvedilol 6.25 mg twice daily  Continue with all other medications  Bring all of your meds, your BP cuff and your record of home blood pressures to your next appointment.  Exercise as youre able, try to walk approximately 30 minutes per day.  Keep salt intake to a minimum, especially watch canned and prepared boxed foods.  Eat more fresh fruits and vegetables and fewer canned items.  Avoid eating in fast food restaurants.    HOW TO TAKE YOUR BLOOD PRESSURE: Rest 5 minutes before taking your blood pressure.  Dont smoke or drink caffeinated beverages for at least 30 minutes before. Take your blood pressure before (not after) you eat. Sit comfortably with your back supported and both feet on the floor (dont cross your legs). Elevate your arm to heart level on a table or a desk. Use the proper sized cuff. It should fit smoothly and snugly around your bare upper arm. There should be enough room to slip a fingertip under the cuff. The bottom edge of the cuff should be 1 inch above the crease of the elbow. Ideally, take 3 measurements at one sitting and record the averag

## 2021-11-03 DIAGNOSIS — H40013 Open angle with borderline findings, low risk, bilateral: Secondary | ICD-10-CM | POA: Diagnosis not present

## 2021-11-29 ENCOUNTER — Encounter (HOSPITAL_COMMUNITY): Payer: Self-pay | Admitting: Radiology

## 2021-11-29 ENCOUNTER — Emergency Department (HOSPITAL_COMMUNITY): Payer: Medicare Other

## 2021-11-29 ENCOUNTER — Inpatient Hospital Stay (HOSPITAL_COMMUNITY)
Admission: EM | Admit: 2021-11-29 | Discharge: 2021-12-01 | DRG: 309 | Disposition: A | Discharge status: Home / Self Care | Payer: Medicare Other | Attending: Internal Medicine | Admitting: Internal Medicine

## 2021-11-29 ENCOUNTER — Ambulatory Visit
Admission: EM | Admit: 2021-11-29 | Discharge: 2021-11-29 | Disposition: A | Discharge status: Home / Self Care | Payer: Medicare Other

## 2021-11-29 ENCOUNTER — Other Ambulatory Visit: Payer: Self-pay

## 2021-11-29 DIAGNOSIS — D649 Anemia, unspecified: Secondary | ICD-10-CM | POA: Diagnosis not present

## 2021-11-29 DIAGNOSIS — R1310 Dysphagia, unspecified: Secondary | ICD-10-CM | POA: Diagnosis present

## 2021-11-29 DIAGNOSIS — E875 Hyperkalemia: Secondary | ICD-10-CM | POA: Diagnosis present

## 2021-11-29 DIAGNOSIS — Z882 Allergy status to sulfonamides status: Secondary | ICD-10-CM

## 2021-11-29 DIAGNOSIS — D631 Anemia in chronic kidney disease: Secondary | ICD-10-CM | POA: Diagnosis present

## 2021-11-29 DIAGNOSIS — E871 Hypo-osmolality and hyponatremia: Secondary | ICD-10-CM | POA: Diagnosis present

## 2021-11-29 DIAGNOSIS — R001 Bradycardia, unspecified: Secondary | ICD-10-CM | POA: Diagnosis not present

## 2021-11-29 DIAGNOSIS — I959 Hypotension, unspecified: Secondary | ICD-10-CM | POA: Diagnosis present

## 2021-11-29 DIAGNOSIS — N179 Acute kidney failure, unspecified: Secondary | ICD-10-CM | POA: Diagnosis present

## 2021-11-29 DIAGNOSIS — E785 Hyperlipidemia, unspecified: Secondary | ICD-10-CM | POA: Diagnosis present

## 2021-11-29 DIAGNOSIS — Z8249 Family history of ischemic heart disease and other diseases of the circulatory system: Secondary | ICD-10-CM

## 2021-11-29 DIAGNOSIS — R9431 Abnormal electrocardiogram [ECG] [EKG]: Secondary | ICD-10-CM | POA: Diagnosis not present

## 2021-11-29 DIAGNOSIS — K219 Gastro-esophageal reflux disease without esophagitis: Secondary | ICD-10-CM | POA: Diagnosis present

## 2021-11-29 DIAGNOSIS — I10 Essential (primary) hypertension: Secondary | ICD-10-CM | POA: Diagnosis present

## 2021-11-29 DIAGNOSIS — R42 Dizziness and giddiness: Secondary | ICD-10-CM

## 2021-11-29 DIAGNOSIS — Z880 Allergy status to penicillin: Secondary | ICD-10-CM | POA: Diagnosis not present

## 2021-11-29 DIAGNOSIS — Z79899 Other long term (current) drug therapy: Secondary | ICD-10-CM | POA: Diagnosis not present

## 2021-11-29 DIAGNOSIS — R0602 Shortness of breath: Secondary | ICD-10-CM | POA: Diagnosis not present

## 2021-11-29 DIAGNOSIS — Z881 Allergy status to other antibiotic agents status: Secondary | ICD-10-CM

## 2021-11-29 DIAGNOSIS — K5900 Constipation, unspecified: Secondary | ICD-10-CM | POA: Diagnosis present

## 2021-11-29 DIAGNOSIS — E860 Dehydration: Secondary | ICD-10-CM | POA: Diagnosis present

## 2021-11-29 DIAGNOSIS — N1831 Chronic kidney disease, stage 3a: Secondary | ICD-10-CM | POA: Diagnosis not present

## 2021-11-29 DIAGNOSIS — Z20822 Contact with and (suspected) exposure to covid-19: Secondary | ICD-10-CM | POA: Diagnosis present

## 2021-11-29 DIAGNOSIS — Z91018 Allergy to other foods: Secondary | ICD-10-CM

## 2021-11-29 DIAGNOSIS — Z885 Allergy status to narcotic agent status: Secondary | ICD-10-CM

## 2021-11-29 DIAGNOSIS — Z886 Allergy status to analgesic agent status: Secondary | ICD-10-CM

## 2021-11-29 DIAGNOSIS — Z91041 Radiographic dye allergy status: Secondary | ICD-10-CM

## 2021-11-29 DIAGNOSIS — R55 Syncope and collapse: Secondary | ICD-10-CM | POA: Diagnosis present

## 2021-11-29 DIAGNOSIS — Z91030 Bee allergy status: Secondary | ICD-10-CM

## 2021-11-29 DIAGNOSIS — I129 Hypertensive chronic kidney disease with stage 1 through stage 4 chronic kidney disease, or unspecified chronic kidney disease: Secondary | ICD-10-CM | POA: Diagnosis present

## 2021-11-29 DIAGNOSIS — Z888 Allergy status to other drugs, medicaments and biological substances status: Secondary | ICD-10-CM

## 2021-11-29 DIAGNOSIS — D320 Benign neoplasm of cerebral meninges: Secondary | ICD-10-CM | POA: Diagnosis present

## 2021-11-29 DIAGNOSIS — T447X5A Adverse effect of beta-adrenoreceptor antagonists, initial encounter: Secondary | ICD-10-CM | POA: Diagnosis present

## 2021-11-29 LAB — RESP PANEL BY RT-PCR (FLU A&B, COVID) ARPGX2
Influenza A by PCR: NEGATIVE
Influenza B by PCR: NEGATIVE
SARS Coronavirus 2 by RT PCR: NEGATIVE

## 2021-11-29 LAB — CBC
HCT: 37.5 % (ref 36.0–46.0)
Hemoglobin: 12.3 g/dL (ref 12.0–15.0)
MCH: 32.2 pg (ref 26.0–34.0)
MCHC: 32.8 g/dL (ref 30.0–36.0)
MCV: 98.2 fL (ref 80.0–100.0)
Platelets: 255 10*3/uL (ref 150–400)
RBC: 3.82 MIL/uL — ABNORMAL LOW (ref 3.87–5.11)
RDW: 12.3 % (ref 11.5–15.5)
WBC: 8.8 10*3/uL (ref 4.0–10.5)
nRBC: 0 % (ref 0.0–0.2)

## 2021-11-29 LAB — BASIC METABOLIC PANEL
Anion gap: 8 (ref 5–15)
BUN: 27 mg/dL — ABNORMAL HIGH (ref 8–23)
CO2: 20 mmol/L — ABNORMAL LOW (ref 22–32)
Calcium: 9.1 mg/dL (ref 8.9–10.3)
Chloride: 101 mmol/L (ref 98–111)
Creatinine, Ser: 1.77 mg/dL — ABNORMAL HIGH (ref 0.44–1.00)
GFR, Estimated: 28 mL/min — ABNORMAL LOW (ref >60)
Glucose, Bld: 168 mg/dL — ABNORMAL HIGH (ref 70–99)
Potassium: 5.5 mmol/L — ABNORMAL HIGH (ref 3.5–5.1)
Sodium: 129 mmol/L — ABNORMAL LOW (ref 135–145)

## 2021-11-29 LAB — CBG MONITORING, ED: Glucose-Capillary: 175 mg/dL — ABNORMAL HIGH (ref 70–99)

## 2021-11-29 MED ORDER — ALBUTEROL SULFATE (2.5 MG/3ML) 0.083% IN NEBU
2.5000 mg | INHALATION_SOLUTION | Freq: Once | RESPIRATORY_TRACT | Status: AC
  Administered 2021-11-29: 2.5 mg via RESPIRATORY_TRACT
  Filled 2021-11-29: qty 3

## 2021-11-29 NOTE — ED Triage Notes (Signed)
Pt was seen at [facility] for c/o dizziness and near syncope. Pt says she feels SOB but denies chest pain.   [facility] sent pt [facility] after abnormal EKG.

## 2021-11-29 NOTE — ED Triage Notes (Signed)
Pt presents to the urgent care for dizzy spells that started last week. Pt feels like to room is spending around. Pt does not report any SOB or chest pain at this time.

## 2021-11-29 NOTE — ED Provider Notes (Signed)
[facility]  Provider Note  CSN: [id number] Arrival date & time: 11/29/21 1902  History Chief Complaint  Patient presents with   Near Syncope    Stephanie West is a 81 y.o. female here for evaluation of dizziness with some features of vertigo (room spinning, off balance) and some of near syncope (lightheaded, SOB, feels faint). She was seen at [facility] and sent to the ED for low HR. She was noted to be in the 30s when she was initially placed on cardiac monitory but has improved to upper 50s but the time of my evaluation. She has not had any CP, no headches. No vomiting. She has had dizziness before with low sodium. She has difficult to control HTN currently on several medications including Metorpolol 25 BID and Diltiazem CR 360mg  as well as clonidine patch, guanfacine, hydralazine and olmesartan.    Home Medications Prior to Admission medications   Medication Sig Start Date End Date Taking? Authorizing Provider  acetaminophen (TYLENOL) 325 MG tablet Take 2 tablets (650 mg total) by mouth every 6 (six) hours as needed for mild pain (or Fever >/= 101). 02/11/21  Yes Emokpae, Courage, MD  albuterol (PROVENTIL HFA;VENTOLIN HFA) 108 (90 BASE) MCG/ACT inhaler Inhale 2 puffs into the lungs every 6 (six) hours as needed for wheezing or shortness of breath.   Yes [provider]  carvedilol (COREG) 6.25 MG tablet Take 1 tablet (6.25 mg total) by mouth 2 (two) times daily. 10/20/21  Yes BranchAlphonse Guild, MD  cloNIDine (CATAPRES - DOSED IN MG/24 HR) 0.2 mg/24hr patch Place 1 patch (0.2 mg total) onto the skin once a week. Every Thursday 02/17/21  Yes Roxan Hockey, MD  diltiazem (CARDIZEM CD) 360 MG 24 hr capsule Take 360 mg by mouth daily.   Yes [provider]  esomeprazole (NEXIUM) 40 MG capsule Take 1 capsule (40 mg total) by mouth daily before breakfast. 06/16/21  Yes Montez Morita, Daniel, MD  ezetimibe (ZETIA) 10 MG tablet Take 10 mg by mouth daily.   Yes  [provider]  feeding supplement (ENSURE ENLIVE / ENSURE PLUS) LIQD Take 237 mLs by mouth 3 (three) times daily between meals. 02/11/21  Yes Emokpae, Courage, MD  guanFACINE (INTUNIV) 1 MG TB24 ER tablet Take 1 mg by mouth daily. 04/01/20  Yes [provider]  hydrALAZINE (APRESOLINE) 25 MG tablet Take 1.5 tablets (37.5 mg total) by mouth 3 (three) times daily. Patient taking differently: Take 50 mg by mouth 3 (three) times daily. 10/03/21 01/01/22 Yes Branch, Alphonse Guild, MD  levocetirizine (XYZAL) 5 MG tablet Take 5 mg by mouth at bedtime. 10/14/21  Yes [provider]  loteprednol (LOTEMAX) 0.2 % SUSP Place 1 drop into both eyes daily.    Yes [provider]  Netarsudil Dimesylate 0.02 % SOLN Place 1 drop into both eyes every evening.   Yes [provider]  olmesartan (BENICAR) 40 MG tablet Take 1 tablet by mouth daily. 06/18/19  Yes [provider]  polyethylene glycol powder (GLYCOLAX/MIRALAX) 17 GM/SCOOP powder Take 1 Container by mouth as needed for mild constipation.   Yes [provider]  potassium citrate (UROCIT-K) 10 MEQ (1080 MG) SR tablet Take 10 mEq by mouth 3 (three) times daily with meals.   Yes [provider]  meclizine (ANTIVERT) 25 MG tablet Take 1 tablet (25 mg total) by mouth 3 (three) times daily as needed for dizziness. Patient not taking: Reported on 10/20/2021 01/17/21   Benedetto Goad  N, PA-C  ondansetron (ZOFRAN) 4 MG tablet Take 1 tablet (4 mg total) by mouth every 6 (six) hours as needed for nausea. Patient not taking: Reported on 11/29/2021 02/11/21   Roxan Hockey, MD     Allergies    Apple, Aspirin, Avelox [moxifloxacin hcl in nacl], Bee venom, Benadryl [diphenhydramine hcl], Chicken allergy, Codeine, Factive [gemifloxacin mesylate], Factive [gemifloxacin], Hctz [hydrochlorothiazide], Iodinated contrast media, Iodine, Levaquin [levofloxacin in d5w], Levofloxacin, Penicillins, Sulfa antibiotics, and  Tequin [gatifloxacin]   Review of Systems   Review of Systems Please see HPI for pertinent positives and negatives  Physical Exam BP (!) 143/58    Pulse (!) 58    Resp 17    Ht 5\' 4"  (1.626 m)    Wt 49.9 kg    SpO2 94%    BMI 18.88 kg/m   Physical Exam Vitals and nursing note reviewed.  Constitutional:      Appearance: Normal appearance.  HENT:     Head: Normocephalic and atraumatic.     Nose: Nose normal.     Mouth/Throat:     Mouth: Mucous membranes are moist.  Eyes:     Extraocular Movements: Extraocular movements intact.     Conjunctiva/sclera: Conjunctivae normal.  Cardiovascular:     Rate and Rhythm: Regular rhythm. Bradycardia present.  Pulmonary:     Effort: Pulmonary effort is normal.     Breath sounds: Normal breath sounds.  Abdominal:     General: Abdomen is flat.     Palpations: Abdomen is soft.     Tenderness: There is no abdominal tenderness.  Musculoskeletal:        General: No swelling. Normal range of motion.     Cervical back: Neck supple.  Skin:    General: Skin is warm and dry.  Neurological:     General: No focal deficit present.     Mental Status: She is alert and oriented to person, place, and time.     Cranial Nerves: No cranial nerve deficit.     Sensory: No sensory deficit.     Motor: No weakness.  Psychiatric:        Mood and Affect: Mood normal.    ED Results / Procedures / Treatments   EKG EKG Interpretation  Date/Time:  Tuesday November 29 2021 19:31:55 EST Ventricular Rate:  48 PR Interval:  195 QRS Duration: 99 QT Interval:  463 QTC Calculation: 414 R Axis:   63 Text Interpretation: Sinus bradycardia Since last tracing EARLIER SAME DATE at [facility], HR has improved Confirmed by Calvert Cantor [phone]) on 11/29/2021 8:00:51 PM  Procedures Procedures  Medications Ordered in the ED Medications - No data to display  Initial Impression and Plan  Patient here with nonspecific dizziness, noted to be bradycardic initially, improved  without intervention. Currently upper 14s. She is on HTN meds which may also cause bradycardia. Will check labs, CT head for the dizziness, prior history of meningioma. Continue to monitor.   ED Course   Clinical Course as of 11/29/21 2208  Tue Nov 29, 2021  2028 BMP with mild hyponatremia, CKD is about at baseline. CBC is normal.  [CS]  2044 Meningioma is unchanged from previous, otherwise no significant findings on CT.  [CS]  2148 Reviewed patient's monitor tracings. She had HR in the 30s with occasional sinus pause of 4-5 seconds initially. None since then. Spoke with Dr. Conley Canal, Cardiology, who recommends admission to hospitalist at [facility], hold metoprolol and diltiazem and have Cardiology consult tomorrow.  [CS]  2205 Spoke with Dr. Clearence Ped, Hospitalist, who will evaluate the patient for admission.  [CS]    Clinical Course User Index [CS] Truddie Hidden, MD     MDM Rules/Calculators/A&P Medical Decision Making Problems Addressed: Symptomatic bradycardia: acute illness or injury that poses a threat to life or bodily functions  Amount and/or Complexity of Data Reviewed Labs: ordered. Decision-making details documented in ED Course. Radiology: ordered and independent interpretation performed. Decision-making details documented in ED Course. ECG/medicine tests: ordered and independent interpretation performed. Decision-making details documented in ED Course.  Risk Decision regarding hospitalization.    Final Clinical Impression(s) / ED Diagnoses Final diagnoses:  Symptomatic bradycardia    Rx / DC Orders ED Discharge Orders     None        Truddie Hidden, MD 11/29/21 2208

## 2021-11-30 ENCOUNTER — Encounter (HOSPITAL_COMMUNITY): Payer: Self-pay | Admitting: Family Medicine

## 2021-11-30 ENCOUNTER — Observation Stay (HOSPITAL_COMMUNITY): Payer: Medicare Other

## 2021-11-30 DIAGNOSIS — E871 Hypo-osmolality and hyponatremia: Secondary | ICD-10-CM

## 2021-11-30 DIAGNOSIS — I129 Hypertensive chronic kidney disease with stage 1 through stage 4 chronic kidney disease, or unspecified chronic kidney disease: Secondary | ICD-10-CM | POA: Diagnosis present

## 2021-11-30 DIAGNOSIS — Z882 Allergy status to sulfonamides status: Secondary | ICD-10-CM | POA: Diagnosis not present

## 2021-11-30 DIAGNOSIS — D320 Benign neoplasm of cerebral meninges: Secondary | ICD-10-CM | POA: Diagnosis present

## 2021-11-30 DIAGNOSIS — Z20822 Contact with and (suspected) exposure to covid-19: Secondary | ICD-10-CM | POA: Diagnosis present

## 2021-11-30 DIAGNOSIS — N1831 Chronic kidney disease, stage 3a: Secondary | ICD-10-CM

## 2021-11-30 DIAGNOSIS — D649 Anemia, unspecified: Secondary | ICD-10-CM | POA: Diagnosis not present

## 2021-11-30 DIAGNOSIS — R55 Syncope and collapse: Secondary | ICD-10-CM

## 2021-11-30 DIAGNOSIS — Z79899 Other long term (current) drug therapy: Secondary | ICD-10-CM | POA: Diagnosis not present

## 2021-11-30 DIAGNOSIS — R1310 Dysphagia, unspecified: Secondary | ICD-10-CM | POA: Diagnosis present

## 2021-11-30 DIAGNOSIS — I10 Essential (primary) hypertension: Secondary | ICD-10-CM

## 2021-11-30 DIAGNOSIS — K219 Gastro-esophageal reflux disease without esophagitis: Secondary | ICD-10-CM | POA: Diagnosis present

## 2021-11-30 DIAGNOSIS — Z881 Allergy status to other antibiotic agents status: Secondary | ICD-10-CM | POA: Diagnosis not present

## 2021-11-30 DIAGNOSIS — Z880 Allergy status to penicillin: Secondary | ICD-10-CM | POA: Diagnosis not present

## 2021-11-30 DIAGNOSIS — R001 Bradycardia, unspecified: Secondary | ICD-10-CM | POA: Diagnosis not present

## 2021-11-30 DIAGNOSIS — N179 Acute kidney failure, unspecified: Secondary | ICD-10-CM

## 2021-11-30 DIAGNOSIS — Z91030 Bee allergy status: Secondary | ICD-10-CM | POA: Diagnosis not present

## 2021-11-30 DIAGNOSIS — E785 Hyperlipidemia, unspecified: Secondary | ICD-10-CM | POA: Diagnosis present

## 2021-11-30 DIAGNOSIS — K5900 Constipation, unspecified: Secondary | ICD-10-CM | POA: Diagnosis present

## 2021-11-30 DIAGNOSIS — E875 Hyperkalemia: Secondary | ICD-10-CM

## 2021-11-30 DIAGNOSIS — Z91041 Radiographic dye allergy status: Secondary | ICD-10-CM | POA: Diagnosis not present

## 2021-11-30 DIAGNOSIS — D631 Anemia in chronic kidney disease: Secondary | ICD-10-CM | POA: Diagnosis present

## 2021-11-30 DIAGNOSIS — Z885 Allergy status to narcotic agent status: Secondary | ICD-10-CM | POA: Diagnosis not present

## 2021-11-30 DIAGNOSIS — Z886 Allergy status to analgesic agent status: Secondary | ICD-10-CM | POA: Diagnosis not present

## 2021-11-30 DIAGNOSIS — I959 Hypotension, unspecified: Secondary | ICD-10-CM | POA: Diagnosis present

## 2021-11-30 DIAGNOSIS — E860 Dehydration: Secondary | ICD-10-CM | POA: Diagnosis present

## 2021-11-30 LAB — ECHOCARDIOGRAM COMPLETE
AR max vel: 2.22 cm2
AV Area VTI: 2.41 cm2
AV Area mean vel: 2.27 cm2
AV Mean grad: 3 mmHg
AV Peak grad: 6 mmHg
Ao pk vel: 1.22 m/s
Area-P 1/2: 1.93 cm2
Calc EF: 67.7 %
Height: 64 in
MV VTI: 1.97 cm2
S' Lateral: 2.1 cm
Single Plane A2C EF: 58.8 %
Single Plane A4C EF: 74.3 %
Weight: 1760 oz

## 2021-11-30 LAB — COMPREHENSIVE METABOLIC PANEL
ALT: 11 U/L (ref 0–44)
AST: 16 U/L (ref 15–41)
Albumin: 3.1 g/dL — ABNORMAL LOW (ref 3.5–5.0)
Alkaline Phosphatase: 54 U/L (ref 38–126)
Anion gap: 4 — ABNORMAL LOW (ref 5–15)
BUN: 24 mg/dL — ABNORMAL HIGH (ref 8–23)
CO2: 24 mmol/L (ref 22–32)
Calcium: 8.8 mg/dL — ABNORMAL LOW (ref 8.9–10.3)
Chloride: 104 mmol/L (ref 98–111)
Creatinine, Ser: 1.55 mg/dL — ABNORMAL HIGH (ref 0.44–1.00)
GFR, Estimated: 33 mL/min — ABNORMAL LOW (ref >60)
Glucose, Bld: 116 mg/dL — ABNORMAL HIGH (ref 70–99)
Potassium: 5 mmol/L (ref 3.5–5.1)
Sodium: 132 mmol/L — ABNORMAL LOW (ref 135–145)
Total Bilirubin: 0.7 mg/dL (ref 0.3–1.2)
Total Protein: 5.8 g/dL — ABNORMAL LOW (ref 6.5–8.1)

## 2021-11-30 LAB — CBC
HCT: 34.6 % — ABNORMAL LOW (ref 36.0–46.0)
Hemoglobin: 11.3 g/dL — ABNORMAL LOW (ref 12.0–15.0)
MCH: 31.9 pg (ref 26.0–34.0)
MCHC: 32.7 g/dL (ref 30.0–36.0)
MCV: 97.7 fL (ref 80.0–100.0)
Platelets: 208 10*3/uL (ref 150–400)
RBC: 3.54 MIL/uL — ABNORMAL LOW (ref 3.87–5.11)
RDW: 12.1 % (ref 11.5–15.5)
WBC: 4.7 10*3/uL (ref 4.0–10.5)
nRBC: 0 % (ref 0.0–0.2)

## 2021-11-30 LAB — TSH: TSH: 2.021 u[IU]/mL (ref 0.350–4.500)

## 2021-11-30 LAB — OSMOLALITY: Osmolality: 291 mOsm/kg (ref 275–295)

## 2021-11-30 LAB — MAGNESIUM: Magnesium: 1.8 mg/dL (ref 1.7–2.4)

## 2021-11-30 MED ORDER — HEPARIN SODIUM (PORCINE) 5000 UNIT/ML IJ SOLN: 5000.0000 [IU] | Freq: Three times a day (TID) | INTRAMUSCULAR | Status: DC

## 2021-11-30 MED ORDER — GUANFACINE HCL ER 1 MG PO TB24
1.0000 mg | ORAL_TABLET | Freq: Every day | ORAL | Status: DC
  Administered 2021-11-30 – 2021-12-01 (×2): 1 mg via ORAL
  Filled 2021-11-30 (×3): qty 1

## 2021-11-30 MED ORDER — HYDRALAZINE HCL 25 MG PO TABS
50.0000 mg | ORAL_TABLET | Freq: Three times a day (TID) | ORAL | Status: DC
  Administered 2021-11-30 – 2021-12-01 (×4): 50 mg via ORAL
  Filled 2021-11-30 (×4): qty 2

## 2021-11-30 MED ORDER — SODIUM CHLORIDE 0.9 % IV SOLN: INTRAVENOUS | Status: DC

## 2021-11-30 MED ORDER — POLYETHYLENE GLYCOL 3350 17 G PO PACK
1.0000 | PACK | Freq: Two times a day (BID) | ORAL | Status: DC
  Administered 2021-11-30 – 2021-12-01 (×3): 17 g via ORAL
  Filled 2021-11-30 (×3): qty 1

## 2021-11-30 MED ORDER — LORATADINE 10 MG PO TABS
10.0000 mg | ORAL_TABLET | Freq: Every day | ORAL | Status: DC
  Administered 2021-11-30: 10 mg via ORAL
  Filled 2021-11-30: qty 1

## 2021-11-30 MED ORDER — LEVOCETIRIZINE DIHYDROCHLORIDE 5 MG PO TABS: 5.0000 mg | ORAL_TABLET | Freq: Every day | ORAL | Status: DC

## 2021-11-30 MED ORDER — OXYCODONE HCL 5 MG PO TABS: 5.0000 mg | ORAL_TABLET | ORAL | Status: DC | PRN

## 2021-11-30 MED ORDER — POLYETHYLENE GLYCOL 3350 17 G PO PACK: 1.0000 | PACK | ORAL | Status: DC | PRN

## 2021-11-30 MED ORDER — ALBUTEROL SULFATE (2.5 MG/3ML) 0.083% IN NEBU: 2.5000 mg | INHALATION_SOLUTION | Freq: Four times a day (QID) | RESPIRATORY_TRACT | Status: DC | PRN

## 2021-11-30 MED ORDER — ONDANSETRON HCL 4 MG PO TABS: 4.0000 mg | ORAL_TABLET | Freq: Four times a day (QID) | ORAL | Status: DC | PRN

## 2021-11-30 MED ORDER — ACETAMINOPHEN 325 MG PO TABS: 650.0000 mg | ORAL_TABLET | Freq: Four times a day (QID) | ORAL | Status: DC | PRN

## 2021-11-30 MED ORDER — ACETAMINOPHEN 650 MG RE SUPP: 650.0000 mg | Freq: Four times a day (QID) | RECTAL | Status: DC | PRN

## 2021-11-30 MED ORDER — PANTOPRAZOLE SODIUM 40 MG PO TBEC
40.0000 mg | DELAYED_RELEASE_TABLET | Freq: Every day | ORAL | Status: DC
  Administered 2021-11-30 – 2021-12-01 (×2): 40 mg via ORAL
  Filled 2021-11-30 (×2): qty 1

## 2021-11-30 MED ORDER — CLONIDINE HCL 0.2 MG/24HR TD PTWK
0.2000 mg | MEDICATED_PATCH | TRANSDERMAL | Status: DC
  Administered 2021-12-01: 0.2 mg via TRANSDERMAL
  Filled 2021-11-30 (×2): qty 1

## 2021-11-30 MED ORDER — HEPARIN SODIUM (PORCINE) 5000 UNIT/ML IJ SOLN
5000.0000 [IU] | Freq: Three times a day (TID) | INTRAMUSCULAR | Status: DC
  Administered 2021-11-30 – 2021-12-01 (×4): 5000 [IU] via SUBCUTANEOUS
  Filled 2021-11-30 (×4): qty 1

## 2021-11-30 MED ORDER — ONDANSETRON HCL 4 MG/2ML IJ SOLN
4.0000 mg | Freq: Four times a day (QID) | INTRAMUSCULAR | Status: DC | PRN
  Administered 2021-11-30: 4 mg via INTRAVENOUS
  Filled 2021-11-30: qty 2

## 2021-11-30 MED ORDER — ENSURE ENLIVE PO LIQD
237.0000 mL | Freq: Three times a day (TID) | ORAL | Status: DC
  Administered 2021-11-30 – 2021-12-01 (×2): 237 mL via ORAL
  Filled 2021-11-30 (×5): qty 237

## 2021-11-30 MED ORDER — EZETIMIBE 10 MG PO TABS
10.0000 mg | ORAL_TABLET | Freq: Every day | ORAL | Status: DC
  Administered 2021-11-30 – 2021-12-01 (×2): 10 mg via ORAL
  Filled 2021-11-30 (×2): qty 1

## 2021-11-30 MED ORDER — POLYETHYLENE GLYCOL 3350 17 G PO PACK
17.0000 g | PACK | Freq: Every day | ORAL | Status: DC
  Filled 2021-11-30: qty 1

## 2021-11-30 MED ORDER — ALBUTEROL SULFATE HFA 108 (90 BASE) MCG/ACT IN AERS: 2.0000 | INHALATION_SPRAY | Freq: Four times a day (QID) | RESPIRATORY_TRACT | Status: DC | PRN

## 2021-11-30 NOTE — Assessment & Plan Note (Addendum)
Potassium 5.5 Secondary to AKI and olmesartan and Urocit-K supplement Hold olmesartan and potassium supplement Continue IVF Resolved--4.3 on day of dc

## 2021-11-30 NOTE — Consult Note (Signed)
CARDIOLOGY CONSULT NOTE       Patient ID: Stephanie West MRN: [id number] DOB/AGE: 06-26-1941 81 y.o.  Admit date: 11/29/2021 Referring Physician: Tat Primary Physician: Celene Squibb, MD Primary Cardiologist: Branch Reason for Consultation: Bradycardia   Principal Problem:   Symptomatic bradycardia Active Problems:   GERD (gastroesophageal reflux disease)   Hyponatremia   Hypertension   Hyperkalemia   Acute renal failure superimposed on stage 3a chronic kidney disease (HCC)   HPI:  81 y.o. with history of palpitations, HTN Had been on coreg and monitor with no PAF SR /ST patient stopped lopressor on her own Has had fatigue and dizziness with poor PO intake HR per EMS in 30's no AV block recorded HR improved spontaneously in 50-60s now Continues to have fatigue She has had fatigue with clonidine low Na on HCTZ  K 5.5 on admission with Cr 1.77 BUN 27 dry. TSH normal Hct stable 34.7  COVID negative No chest pain dyspnea frank syncope Lost 4 lbs with some nausea and constipation ECG with junctional bradycardia rate 38 Then sinus bradycardia rate 48  Monitor 07/04/20 Mn HR 48 average 65 max 119 no AV block  ROS All other systems reviewed and negative except as noted above  Past Medical History:  Diagnosis Date   Asthma    Dysphagia 05/10/2017   Elevated transaminase level    GERD (gastroesophageal reflux disease)    Hypertension    MVP (mitral valve prolapse)     Family History  Problem Relation Age of Onset   Hypertension Mother    Prostate cancer Father     Social History   Socioeconomic History   Marital status: Widowed    Spouse name: Not on file   Number of children: Not on file   Years of education: Not on file   Highest education level: Not on file  Occupational History   Not on file  Tobacco Use   Smoking status: Never   Smokeless tobacco: Never  Vaping Use   Vaping Use: Never used  Substance and Sexual Activity   Alcohol use: No    Alcohol/week: 0.0  standard drinks   Drug use: No   Sexual activity: Not on file  Other Topics Concern   Not on file  Social History Narrative   Not on file   Social Determinants of Health   Financial Resource Strain: Not on file  Food Insecurity: Not on file  Transportation Needs: Not on file  Physical Activity: Not on file  Stress: Not on file  Social Connections: Not on file  Intimate Partner Violence: Not on file    Past Surgical History:  Procedure Laterality Date   COLONOSCOPY     ESOPHAGEAL DILATION  07/30/2015   Procedure: ESOPHAGEAL DILATION;  Surgeon: Rogene Houston, MD;  [facility];  Service: Endoscopy;;   ESOPHAGOGASTRODUODENOSCOPY N/A 04/15/2015   Procedure: ESOPHAGOGASTRODUODENOSCOPY (EGD);  Surgeon: Rogene Houston, MD;  [facility];  Service: Endoscopy;  Laterality: N/A;  125 - moved to 1:00, Ann to notify pt   ESOPHAGOGASTRODUODENOSCOPY N/A 07/30/2015   Procedure: ESOPHAGOGASTRODUODENOSCOPY (EGD);  Surgeon: Rogene Houston, MD;  [facility];  Service: Endoscopy;  Laterality: N/A;  1040   ESOPHAGOGASTRODUODENOSCOPY (EGD) WITH ESOPHAGEAL DILATION N/A 03/07/2013   Procedure: ESOPHAGOGASTRODUODENOSCOPY (EGD) WITH ESOPHAGEAL DILATION;  Surgeon: Rogene Houston, MD;  [facility];  Service: Endoscopy;  Laterality: N/A;  830   EYE SURGERY     LAPAROSCOPIC TUBAL  LIGATION  1984   UPPER GASTROINTESTINAL ENDOSCOPY        Current Facility-Administered Medications:    0.9 %  sodium chloride infusion, , Intravenous, Continuous, Zierle-Ghosh, Asia B, DO, Last Rate: 75 mL/hr at 11/30/21 0254, New Bag at 11/30/21 0254   acetaminophen (TYLENOL) tablet 650 mg, 650 mg, Oral, Q6H PRN **OR** acetaminophen (TYLENOL) suppository 650 mg, 650 mg, Rectal, Q6H PRN, Zierle-Ghosh, Asia B, DO   albuterol (PROVENTIL) (2.5 MG/3ML) 0.083% nebulizer solution 2.5 mg, 2.5 mg, Nebulization, Q6H PRN, Zierle-Ghosh, Asia B, DO   [START ON 12/01/2021] cloNIDine (CATAPRES - Dosed  in mg/24 hr) patch 0.2 mg, 0.2 mg, Transdermal, Weekly, Zierle-Ghosh, Asia B, DO   ezetimibe (ZETIA) tablet 10 mg, 10 mg, Oral, Daily, Zierle-Ghosh, Asia B, DO   feeding supplement (ENSURE ENLIVE / ENSURE PLUS) liquid 237 mL, 237 mL, Oral, TID BM, Zierle-Ghosh, Asia B, DO   guanFACINE (INTUNIV) ER tablet 1 mg, 1 mg, Oral, Daily, Zierle-Ghosh, Asia B, DO   heparin injection 5,000 Units, 5,000 Units, Subcutaneous, Q8H, Zierle-Ghosh, Asia B, DO, 5,000 Units at 11/30/21 0602   hydrALAZINE (APRESOLINE) tablet 50 mg, 50 mg, Oral, TID, Zierle-Ghosh, Asia B, DO   loratadine (CLARITIN) tablet 10 mg, 10 mg, Oral, QHS, Zierle-Ghosh, Asia B, DO   ondansetron (ZOFRAN) tablet 4 mg, 4 mg, Oral, Q6H PRN **OR** ondansetron (ZOFRAN) injection 4 mg, 4 mg, Intravenous, Q6H PRN, Zierle-Ghosh, Asia B, DO   oxyCODONE (Oxy IR/ROXICODONE) immediate release tablet 5 mg, 5 mg, Oral, Q4H PRN, Zierle-Ghosh, Asia B, DO   pantoprazole (PROTONIX) EC tablet 40 mg, 40 mg, Oral, Daily, Zierle-Ghosh, Asia B, DO   polyethylene glycol (MIRALAX / GLYCOLAX) packet 17 g, 17 g, Oral, Daily, Zierle-Ghosh, Asia B, DO   polyethylene glycol (MIRALAX / GLYCOLAX) packet 17 g, 1 packet, Oral, BID, Zierle-Ghosh, Asia B, DO  [START ON 12/01/2021] cloNIDine  0.2 mg Transdermal Weekly   ezetimibe  10 mg Oral Daily   feeding supplement  237 mL Oral TID BM   guanFACINE  1 mg Oral Daily   heparin  5,000 Units Subcutaneous Q8H   hydrALAZINE  50 mg Oral TID   loratadine  10 mg Oral QHS   pantoprazole  40 mg Oral Daily   polyethylene glycol  17 g Oral Daily   polyethylene glycol  1 packet Oral BID    sodium chloride 75 mL/hr at 11/30/21 0254    Physical Exam: Blood pressure (!) 189/62, pulse 66, temperature 97.7 F (36.5 C), temperature source Oral, resp. rate 15, height 5\' 4"  (1.626 m), weight 49.9 kg, SpO2 97 %.    Affect appropriate Healthy:  appears stated age 81: normal Neck supple with no adenopathy JVP normal no bruits no  thyromegaly Lungs clear with no wheezing and good diaphragmatic motion Heart:  S1/S2 no murmur, no rub, gallop or click PMI normal Abdomen: benighn, BS positve, no tenderness, no AAA no bruit.  No HSM or HJR Distal pulses intact with no bruits No edema Neuro non-focal Skin warm and dry No muscular weakness   Labs:   Lab Results  Component Value Date   WBC 4.7 11/30/2021   HGB 11.3 (L) 11/30/2021   HCT 34.6 (L) 11/30/2021   MCV 97.7 11/30/2021   PLT 208 11/30/2021    Recent Labs  Lab 11/30/21 0331  NA 132*  K 5.0  CL 104  CO2 24  BUN 24*  CREATININE 1.55*  CALCIUM 8.8*  PROT 5.8*  BILITOT 0.7  ALKPHOS 54  ALT 11  AST 16  GLUCOSE 116*   Lab Results  Component Value Date   TROPONINI <0.03 07/03/2015   No results found for: CHOL No results found for: HDL No results found for: LDLCALC No results found for: TRIG No results found for: CHOLHDL No results found for: LDLDIRECT    Radiology: CT Head Wo Contrast  Result Date: 11/29/2021 CLINICAL DATA:  Hit Dizziness, persistent/recurrent, cardiac or vascular cause suspected. Hypertension. Near syncope. EXAM: CT HEAD WITHOUT CONTRAST TECHNIQUE: Contiguous axial images were obtained from the base of the skull through the vertex without intravenous contrast. RADIATION DOSE REDUCTION: This exam was performed according to the departmental dose-optimization program which includes automated exposure control, adjustment of the mA and/or kV according to patient size and/or use of iterative reconstruction technique. COMPARISON:  01/18/2021.  MRI 01/17/2021. FINDINGS: Brain: Large left frontal meningioma again noted measuring 4.3 cm, unchanged since prior study. Mass effect on the adjacent left frontal lobe. There is atrophy and chronic small vessel disease changes. No hemorrhage, hydrocephalus, acute infarction, or midline shift. Vascular: No hyperdense vessel or unexpected calcification. Skull: No acute calvarial abnormality.  Sinuses/Orbits: No acute findings. Mucosal thickening throughout the paranasal sinuses. Other: None IMPRESSION: Large left frontal meningioma, unchanged since prior study. Atrophy, chronic microvascular disease. No acute intracranial abnormality. Electronically Signed   By: Rolm Baptise M.D.   On: 11/29/2021 20:33    EKG: See HPI   ASSESSMENT AND PLAN:   Bradycardia:  likely symptomatic coreg d/c Check echo Hct/TSH ok hydrate as she is dry. SSS no frank indication for PPM at this point will need another 30 day monitor at d/c If she has more junctional rhythm or AV block can transfer to [facility] for EP consult HTN;  continue home meds except Coreg CRF:  hydrate look dry  Anemia   Stable   Signed: Jenkins Rouge 11/30/2021, 8:43 AM

## 2021-11-30 NOTE — ED Notes (Signed)
Admitting at bedside 

## 2021-11-30 NOTE — Progress Notes (Signed)
°  Transition of Care [facility]) Screening Note   Patient Details  Name: Stephanie West Date of Birth: 04-20-1941   Transition of [facility]) CM/SW Contact:    Ihor Gully, LCSW Phone Number: 11/30/2021, 1:56 PM    Transition of Care Department [facility]) has reviewed patient and no TOC needs have been identified at this time. We will continue to monitor patient advancement through interdisciplinary progression rounds. If new patient transition needs arise, please place a TOC consult.   Jayzen Paver, Clydene Pugh, LCSW

## 2021-11-30 NOTE — H&P (Addendum)
History and Physical    Patient: Stephanie West DOB: [birth year] DOA: 11/29/2021 DOS: the patient was seen and examined on 11/30/2021 PCP: Celene Squibb, MD  Patient coming from: Home  Chief Complaint:  Chief Complaint  Patient presents with   Near Syncope    HPI: Stephanie West is a 81 y.o. female with medical history significant of with history of asthma, dysphagia, GERD, hypertension, mitral valve prolapse, presents to ED with a chief complaint of near syncope.  Patient reports that she has been feeling faint.  Is been happening over the last 2-3 weeks.  She has checked her heart rate during these episodes and sees her heart rate is down to 49 at home.  She reports that it happens when she is sitting she becomes short of breath, she feels palpitations, lasts about 5 minutes.  Will be 2 or 3 hours in between and then it will happen again.  Today she went to the urgent care they did an EKG-which showed sinus bradycardia-and sent her into the ED.  Patient reports no chest pain during these episodes.  She has never had anything like this before.  On a separate note she reports decreased p.o. intake.  She has no appetite at all over 2-3 weeks.  She has lost 4 pounds.  She reports nausea, but she also does not believe is associated with the near syncope spells.  Patient reports no diarrhea, dysuria, fevers.  She does report she has been constipated for a week.  She has not tried anything at home for it.  Patient has no other complaints at this time.  Patient does not smoke, does not drink, does not use illicit drugs.  She is vaccinated for COVID.  Patient is full code.  ED provider reports that patient's heart rate was initially 30 when EMS picked her up.,  By the time she got to the hospital her heart rate was 40, by the time the ER provider got to the room her heart rate was 50.  It resolved spontaneously no medications were given.  ED provider spoke with cardiology fellow who felt  patient was safe to stay here.  He recommended labs and holding diltiazem and Coreg.  Review of Systems: As mentioned in the history of present illness. All other systems reviewed and are negative. Past Medical History:  Diagnosis Date   Asthma    Dysphagia 05/10/2017   Elevated transaminase level    GERD (gastroesophageal reflux disease)    Hypertension    MVP (mitral valve prolapse)    Past Surgical History:  Procedure Laterality Date   COLONOSCOPY     ESOPHAGEAL DILATION  07/30/2015   Procedure: ESOPHAGEAL DILATION;  Surgeon: Rogene Houston, MD;  [facility];  Service: Endoscopy;;   ESOPHAGOGASTRODUODENOSCOPY N/A 04/15/2015   Procedure: ESOPHAGOGASTRODUODENOSCOPY (EGD);  Surgeon: Rogene Houston, MD;  [facility];  Service: Endoscopy;  Laterality: N/A;  125 - moved to 1:00, Ann to notify pt   ESOPHAGOGASTRODUODENOSCOPY N/A 07/30/2015   Procedure: ESOPHAGOGASTRODUODENOSCOPY (EGD);  Surgeon: Rogene Houston, MD;  [facility];  Service: Endoscopy;  Laterality: N/A;  1040   ESOPHAGOGASTRODUODENOSCOPY (EGD) WITH ESOPHAGEAL DILATION N/A 03/07/2013   Procedure: ESOPHAGOGASTRODUODENOSCOPY (EGD) WITH ESOPHAGEAL DILATION;  Surgeon: Rogene Houston, MD;  [facility];  Service: Endoscopy;  Laterality: N/A;  [place]   UPPER GASTROINTESTINAL ENDOSCOPY  Social History:  reports that she has never smoked. She has never used smokeless tobacco. She reports that she does not drink alcohol and does not use drugs.  Allergies  Allergen Reactions   Apple Other (See Comments)    Wheezing and Asthma Symptoms    Aspirin Other (See Comments)    Wheezing, Asthma Symptoms    Avelox [Moxifloxacin Hcl In Nacl] Hives    Pt can take cipro   Bee Venom Other (See Comments)    Wheezing, Asthma Symptoms   Benadryl [Diphenhydramine Hcl] Other (See Comments)    Wheezing, Asthma Symptoms    Chicken Allergy Other (See  Comments)    Wheezing and Asthma Symptoms    Codeine Other (See Comments)    Makes patient feel like she's going to pass out.    Factive [Gemifloxacin Mesylate] Hives   Factive [Gemifloxacin] Hives   Hctz [Hydrochlorothiazide]     hyponatremia   Iodinated Contrast Media Other (See Comments)    Pt was told by allergist to avoid  Dye due to other allergies   Iodine    Levaquin [Levofloxacin In D5w] Hives    Pt can take cipro   Levofloxacin Hives   Penicillins Other (See Comments)    Asthma Symptoms .Did it involve swelling of the face/tongue/throat, SOB, or low BP? No Did it involve sudden or severe rash/hives, skin peeling, or any reaction on the inside of your mouth or nose? Yes Did you need to seek medical attention at a hospital or doctor's office? Unknown When did it last happen?      Unkown If all above answers are NO, may proceed with cephalosporin use.    Sulfa Antibiotics Nausea Only   Tequin [Gatifloxacin] Hives    Family History  Problem Relation Age of Onset   Hypertension Mother    Prostate cancer Father     Prior to Admission medications   Medication Sig Start Date End Date Taking? Authorizing Provider  acetaminophen (TYLENOL) 325 MG tablet Take 2 tablets (650 mg total) by mouth every 6 (six) hours as needed for mild pain (or Fever >/= 101). 02/11/21  Yes Emokpae, Courage, MD  albuterol (PROVENTIL HFA;VENTOLIN HFA) 108 (90 BASE) MCG/ACT inhaler Inhale 2 puffs into the lungs every 6 (six) hours as needed for wheezing or shortness of breath.   Yes [provider]  carvedilol (COREG) 6.25 MG tablet Take 1 tablet (6.25 mg total) by mouth 2 (two) times daily. 10/20/21  Yes BranchAlphonse Guild, MD  cloNIDine (CATAPRES - DOSED IN MG/24 HR) 0.2 mg/24hr patch Place 1 patch (0.2 mg total) onto the skin once a week. Every Thursday 02/17/21  Yes Roxan Hockey, MD  diltiazem (CARDIZEM CD) 360 MG 24 hr capsule Take 360 mg by mouth daily.   Yes [provider]   esomeprazole (NEXIUM) 40 MG capsule Take 1 capsule (40 mg total) by mouth daily before breakfast. 06/16/21  Yes Montez Morita, Daniel, MD  ezetimibe (ZETIA) 10 MG tablet Take 10 mg by mouth daily.   Yes [provider]  feeding supplement (ENSURE ENLIVE / ENSURE PLUS) LIQD Take 237 mLs by mouth 3 (three) times daily between meals. 02/11/21  Yes Emokpae, Courage, MD  guanFACINE (INTUNIV) 1 MG TB24 ER tablet Take 1 mg by mouth daily. 04/01/20  Yes [provider]  hydrALAZINE (APRESOLINE) 25 MG tablet Take 1.5 tablets (37.5 mg total) by mouth 3 (three) times daily. Patient taking differently: Take 50 mg by mouth 3 (three) times daily. 10/03/21  01/01/22 Yes Branch, Alphonse Guild, MD  levocetirizine (XYZAL) 5 MG tablet Take 5 mg by mouth at bedtime. 10/14/21  Yes [provider]  loteprednol (LOTEMAX) 0.2 % SUSP Place 1 drop into both eyes daily.    Yes [provider]  Netarsudil Dimesylate 0.02 % SOLN Place 1 drop into both eyes every evening.   Yes [provider]  olmesartan (BENICAR) 40 MG tablet Take 1 tablet by mouth daily. 06/18/19  Yes [provider]  polyethylene glycol powder (GLYCOLAX/MIRALAX) 17 GM/SCOOP powder Take 1 Container by mouth as needed for mild constipation.   Yes [provider]  potassium citrate (UROCIT-K) 10 MEQ (1080 MG) SR tablet Take 10 mEq by mouth 3 (three) times daily with meals.   Yes [provider]  meclizine (ANTIVERT) 25 MG tablet Take 1 tablet (25 mg total) by mouth 3 (three) times daily as needed for dizziness. Patient not taking: Reported on 10/20/2021 01/17/21   Jacqlyn Larsen, PA-C  ondansetron (ZOFRAN) 4 MG tablet Take 1 tablet (4 mg total) by mouth every 6 (six) hours as needed for nausea. Patient not taking: Reported on 11/29/2021 02/11/21   Roxan Hockey, MD    Physical Exam: Vitals:   11/30/21 0000 11/30/21 0030 11/30/21 0130 11/30/21 0200  BP: 125/64 (!) 122/59 (!) 137/59 (!)  130/58  Pulse: (!) 59 (!) 55 63 (!) 59  Resp: 17 12 15 13   SpO2: 93% 94% 94% 94%  Weight:      Height:       1.  General: Patient lying supine in bed,  no acute distress   2. Psychiatric: Alert and oriented x 3, mood and behavior normal for situation, pleasant and cooperative with exam   3. Neurologic: Speech and language are normal, face is symmetric, moves all 4 extremities voluntarily, at baseline without acute deficits on limited exam   4. HEENMT:  Head is atraumatic, normocephalic, pupils reactive to light, neck is cachectic, trachea is midline, mucous membranes are moist   5. Respiratory : Lungs are clear to auscultation bilaterally without wheezing, rhonchi, rales, no cyanosis, no increase in work of breathing or accessory muscle use   6. Cardiovascular : Heart rate normal at the time of my exam, rhythm is regular, systolic murmur present, rubs or gallops, no peripheral edema, peripheral pulses palpated   7. Gastrointestinal:  Abdomen is soft, nondistended, nontender to palpation bowel sounds active, no masses or organomegaly palpated   8. Skin:  Skin is warm, dry and intact without rashes, acute lesions, or ulcers on limited exam   9.Musculoskeletal:  No acute deformities or trauma, no asymmetry in tone, no peripheral edema, peripheral pulses palpated, no tenderness to palpation in the extremities   Data Reviewed: In the ED Temp 97.5, heart rate 56-61, respiratory rate 14-18, blood pressure 146/68 maintaining oxygen sats on room air No leukocytosis with a white blood cell count of 8.8, hemoglobin 12.3 Chemistry panel reveals hyponatremia 129, hyperkalemia 5.5, elevated creatinine 1.77 up from 1.3 EKG shows sinus bradycardia with QTc 414 Patient did have a CT head without contrast that showed large left frontal meningioma unchanged from prior study    Assessment and Plan: * Symptomatic bradycardia- (present on admission) Patient has been near syncopal  intermittently for 2-3 weeks She is checked her heart rate at home and its been in the 40s, today at urgent care it was in the 30s Likely secondary to beta-blocker, diltiazem-holding both of these medications Spontaneously resolved without medication Cardiology fellow  consulted and recommends patient stay here any plan for inpatient cardiology consult during the day We will continue to monitor and correct electrolytes Monitor on telemetry  Hyperkalemia- (present on admission) Potassium 5.5 Likely secondary to AKI Continue gentle fluids, albuterol also given Recheck of potassium is pending  Acute renal failure (ARF) ([place])- (present on admission) Patient's creatinine normally 1.3, today 1.77 Multifactorial with poor p.o. intake contributing but also with hypotension that is likely associated with her sinus bradycardia when she is feeling near syncopal Continue gentle fluids Recheck in the a.m. Avoid nephrotoxic agents when possible  Hyponatremia- (present on admission) Secondary to poor p.o. intake Encourage p.o. intake Also gentle hydration with NS Continue to monitor  Hypertension- (present on admission) Holding Coreg and diltiazem in the setting of bradycardia Continue hydralazine and clonidine Continue to monitor  GERD (gastroesophageal reflux disease)- (present on admission) Continue Protonix       Advance Care Planning:   Code Status: Full Code full  Consults: Cardiology  Family Communication: No family at bedside  Severity of Illness: The appropriate patient status for this patient is OBSERVATION. Observation status is judged to be reasonable and necessary in order to provide the required intensity of service to ensure the patient's safety. The patient's presenting symptoms, physical exam findings, and initial radiographic and laboratory data in the context of their medical condition is felt to place them at decreased risk for further clinical deterioration.  Furthermore, it is anticipated that the patient will be medically stable for discharge from the hospital within 2 midnights of admission.   Author: Rolla Plate, DO 11/30/2021 2:20 AM  For on call review [email or web address].

## 2021-11-30 NOTE — Progress Notes (Addendum)
Progress Note   Patient: Stephanie West IRS:[id number] DOB: [birth year] DOA: 11/29/2021     0 DOS: the patient was seen and examined on 11/30/2021   Brief hospital course: 81 year old female with a history of dysphagia, GERD, mitral valve prolapse, hypertension, CKD stage III, and hyponatremia presenting with 2 to 3-week history of near syncope and dizziness.  The patient states that she was started on carvedilol about 3 weeks ago.  She states that her dizziness and near syncopal episodes began after that.  It occurs usually when she is sitting down and lasts about 5 minutes.  She has some associated palpitations, but denies any actual frank syncope, chest pain, shortness of breath, vomiting, or focal extremity weakness or headache.  EMS noted the patient to have a heart rate in the 30s when they picked her up.  Heart rate was in the 40s initially in the ED and improved into the mid 50s subsequently. Patient states that she has had poor appetite and decreased oral intake for the better part of the last 6 months.  She states that she has never been a big eater. Initial BMP showed sodium 129, potassium 5.5, serum creatinine 1.77.  WBC 8.8, hemoglobin 12.3, platelet 55,000.  EKG shows sinus bradycardia with nonspecific T wave changes.  Patient was admitted for further evaluation and treatment of symptomatic bradycardia.   Assessment and Plan: * Symptomatic bradycardia- (present on admission) near syncopal intermittently for 2-3 weeks HR in 30s at urgent care when EMS pick her up Recently started on carvedilol Hold diltiazem and carvedilol Cardiology fellow consulted and recommends patient stay here any plan for inpatient cardiology consult during the day Optimize electrolytes TSH 2.201 Echo Check orthostatics  Acute renal failure superimposed on stage 3a chronic kidney disease ([place]) Baseline creatinine 1.0-1.3 Due to volume depletion Hold olmesartan Continue IVF  Hyperkalemia- (present on  admission) Potassium 5.5 Secondary to AKI and olmesartan and Urocit-K supplement Hold olmesartan and potassium supplement Continue IVF  Hypertension- (present on admission) Holding Coreg and diltiazem in the setting of bradycardia Continue hydralazine and clonidine   Hyponatremia- (present on admission) Has been chronic Due to dehydration and poor solute intake Has been recurrent problem Check urine Na Urine osm Urine creatinine Serum osm Continue IV NS  Acute renal failure (ARF) (HCC) Patient's creatinine normally 1.3, today 1.77 Multifactorial with poor p.o. intake contributing but also with hypotension that is likely associated with her sinus bradycardia when she is feeling near syncopal Continue gentle fluids Recheck in the a.m. Avoid nephrotoxic agents when possible  GERD (gastroesophageal reflux disease)- (present on admission) Continue Protonix        Subjective: patient is feeling better this morning.  Denies f/c, cp, sob, vomiting.  Still feels a bit nausea.  No abd pain, dysuria, hematuria, hematochezia, melena  Physical Exam: Vitals:   11/30/21 0600 11/30/21 0630 11/30/21 0700 11/30/21 0714  BP: (!) 126/54 (!) 153/64 (!) 137/58   Pulse: (!) 57 (!) 57 (!) 59   Resp: 12 18 13    Temp:    98.1 F (36.7 C)  TempSrc:    Oral  SpO2: 92% 95% 96%   Weight:      Height:      HEENT-no icterus, no neck mass CV--RRR, no rub Lung-bibasilar rales. Abd-soft/NT+BS Ext-No c/cE  Data Reviewed:   Family Communication: no family at bedside  Disposition: Status is: Observation The patient remains OBS appropriate and will d/c before 2 midnights.        Planned  Discharge Destination: Home     Time spent: 50 minutes  Author: Orson Eva, MD 11/30/2021 7:42 AM  For on call review [email or web address].

## 2021-11-30 NOTE — Progress Notes (Signed)
*  PRELIMINARY RESULTS* Echocardiogram 2D Echocardiogram has been performed.  Stephanie West 11/30/2021, 11:12 AM

## 2021-11-30 NOTE — Assessment & Plan Note (Addendum)
Baseline creatinine 1.0-1.3 Due to volume depletion Presented with serum creatinine 1.77 Hold olmesartan Continue IVF>>improved to 1.40

## 2021-11-30 NOTE — Hospital Course (Addendum)
81 year old female with a history of dysphagia, GERD, mitral valve prolapse, hypertension, CKD stage III, and hyponatremia presenting with 2 to 3-week history of near syncope and dizziness.  The patient states that she was started on carvedilol about 3 weeks ago.  She states that her dizziness and near syncopal episodes began after that.  It occurs usually when she is sitting down and lasts about 5 minutes.  She has some associated palpitations, but denies any actual frank syncope, chest pain, shortness of breath, vomiting, or focal extremity weakness or headache.  EMS noted the patient to have a heart rate in the 30s when they picked her up.  Heart rate was in the 40s initially in the ED and improved into the mid 50s subsequently. Patient states that she has had poor appetite and decreased oral intake for the better part of the last 6 months.  She states that she has never been a big eater. Initial BMP showed sodium 129, potassium 5.5, serum creatinine 1.77.  WBC 8.8, hemoglobin 12.3, platelet 55,000.  EKG shows sinus bradycardia with nonspecific T wave changes.  Patient was admitted for further evaluation and treatment of symptomatic bradycardia.  Coreg and diltiazem were discontinued with improvement of HR and dizziness.  She was started on IVF with improvement of her Na to 133 on day of d/c.  Clinically, pt is improved and PT did not recommend any follow up.  Diltiazem and coreg will not be restarted with amlodipine started in lieu.  She will follow up with cardiology outpt

## 2021-11-30 NOTE — Assessment & Plan Note (Signed)
Continue Protonix °

## 2021-11-30 NOTE — Assessment & Plan Note (Signed)
Patient's creatinine normally 1.3, today 1.77 Multifactorial with poor p.o. intake contributing but also with hypotension that is likely associated with her sinus bradycardia when she is feeling near syncopal Continue gentle fluids Recheck in the a.m. Avoid nephrotoxic agents when possible

## 2021-11-30 NOTE — Evaluation (Signed)
Physical Therapy Evaluation Patient Details Name: DEA BITTING MRN: [id number] DOB: 1941/07/07 Today's Date: 11/30/2021  History of Present Illness  Stephanie West is a 81 y.o. female with medical history significant of with history of asthma, dysphagia, GERD, hypertension, mitral valve prolapse, presents to ED with a chief complaint of near syncope.  Patient reports that she has been feeling faint.  Is been happening over the last 2-3 weeks.  She has checked her heart rate during these episodes and sees her heart rate is down to 49 at home.  She reports that it happens when she is sitting she becomes short of breath, she feels palpitations, lasts about 5 minutes.  Will be 2 or 3 hours in between and then it will happen again.  Today she went to the urgent care they did an EKG-which showed sinus bradycardia-and sent her into the ED.  Patient reports no chest pain during these episodes.  She has never had anything like this before.  On a separate note she reports decreased p.o. intake.  She has no appetite at all over 2-3 weeks.  She has lost 4 pounds.  She reports nausea, but she also does not believe is associated with the near syncope spells.  Patient reports no diarrhea, dysuria, fevers.  She does report she has been constipated for a week.  She has not tried anything at home for it.  Patient has no other complaints at this time.     Patient does not smoke, does not drink, does not use illicit drugs.  She is vaccinated for COVID.  Patient is full code.   Clinical Impression  Patient functioning at baseline for functional mobility and gait demonstrating good return for ambulation in room and hallways without loss of balance without need for AD.  Plan:  Patient discharged from physical therapy to care of nursing for ambulation daily as tolerated for length of stay.         Recommendations for follow up therapy are one component of a multi-disciplinary discharge planning process, led by the  attending physician.  Recommendations may be updated based on patient status, additional functional criteria and insurance authorization.  Follow Up Recommendations No PT follow up    Assistance Recommended at Discharge PRN  Patient can return home with the following  Other (comment) (patient at baseline)    Equipment Recommendations None recommended by PT  Recommendations for Other Services       Functional Status Assessment Patient has not had a recent decline in their functional status     Precautions / Restrictions Precautions Precautions: None Restrictions Weight Bearing Restrictions: No      Mobility  Bed Mobility Overal bed mobility: Independent                  Transfers Overall transfer level: Independent                      Ambulation/Gait Ambulation/Gait assistance: Modified independent (Device/Increase time) Gait Distance (Feet): 100 Feet Assistive device: IV Pole, None Gait Pattern/deviations: WFL(Within Functional Limits) Gait velocity: slightly decreased     General Gait Details: grossly WFL demonstrating good return for ambulation in room and hallways without loss of balance  Stairs            Wheelchair Mobility    Modified Rankin (Stroke Patients Only)       Balance Overall balance assessment: No apparent balance deficits (not formally assessed)  Pertinent Vitals/Pain Pain Assessment Pain Assessment: No/denies pain    Home Living Family/patient expects to be discharged to:: Private residence Living Arrangements: Alone Available Help at Discharge: Family;Available PRN/intermittently Type of Home: House Home Access: Stairs to enter Entrance Stairs-Rails: None Entrance Stairs-Number of Steps: 1 - entry tep   Home Layout: One level Home Equipment: Conservation officer, nature (2 wheels);Shower seat;Grab bars - tub/shower;Hand held shower head      Prior Function  Prior Level of Function : Independent/Modified Independent             Mobility Comments: Hydrographic surveyor, drives, does own shopping ADLs Comments: Independent     Hand Dominance        Extremity/Trunk Assessment   Upper Extremity Assessment Upper Extremity Assessment: Overall WFL for tasks assessed    Lower Extremity Assessment Lower Extremity Assessment: Overall WFL for tasks assessed    Cervical / Trunk Assessment Cervical / Trunk Assessment: Normal  Communication   Communication: No difficulties  Cognition Arousal/Alertness: Awake/alert Behavior During Therapy: WFL for tasks assessed/performed Overall Cognitive Status: Within Functional Limits for tasks assessed                                          General Comments      Exercises     Assessment/Plan    PT Assessment Patient does not need any further PT services  PT Problem List         PT Treatment Interventions      PT Goals (Current goals can be found in the Care Plan section)  Acute Rehab PT Goals Patient Stated Goal: return home PT Goal Formulation: With patient/family Time For Goal Achievement: 11/30/21 Potential to Achieve Goals: Good    Frequency       Co-evaluation               AM-PAC PT "6 Clicks" Mobility  Outcome Measure Help needed turning from your back to your side while in a flat bed without using bedrails?: None Help needed moving from lying on your back to sitting on the side of a flat bed without using bedrails?: None Help needed moving to and from a bed to a chair (including a wheelchair)?: None Help needed standing up from a chair using your arms (e.g., wheelchair or bedside chair)?: None Help needed to walk in hospital room?: None Help needed climbing 3-5 steps with a railing? : None 6 Click Score: 24    End of Session   Activity Tolerance: Patient tolerated treatment well Patient left: in bed;with call bell/phone within reach;with  family/visitor present Nurse Communication: Mobility status PT Visit Diagnosis: Unsteadiness on feet (R26.81);Other abnormalities of gait and mobility (R26.89);Muscle weakness (generalized) (M62.81)    Time: 1761-6073 PT Time Calculation (min) (ACUTE ONLY): 10 min   Charges:   PT Evaluation $PT Eval Low Complexity: 1 Low PT Treatments $Therapeutic Activity: 8-22 mins        3:23 PM, 11/30/21 Lonell Grandchild, MPT Physical Therapist with [facility] 336 [phone] office [phone] mobile phone

## 2021-11-30 NOTE — Assessment & Plan Note (Addendum)
Has been chronic Due to dehydration and poor solute intake Has been recurrent problem FeNa 1.43%, but pt already fluid resuscitated Continue IV NS>>133 on day of d/c

## 2021-11-30 NOTE — Assessment & Plan Note (Addendum)
near syncopal intermittently for 2-3 weeks HR in 30s at urgent care when EMS pick her up Recently started on carvedilol Hold diltiazem and carvedilol>>improved to 60-70s at rest Appreciate cardiology>>d/c diltiazem and coreg altogether Optimize electrolytes TSH 2.201 Echo--EF 60-65%, no WMA, G1DD, normal RV

## 2021-11-30 NOTE — Assessment & Plan Note (Addendum)
Holding Coreg and diltiazem in the setting of bradycardia>>will not restart Start amlodipine Continue hydralazine and clonidine Will not restart olmesartan due to pt's propensity for dehydration with AKI and hyperkalemia

## 2021-12-01 LAB — BASIC METABOLIC PANEL
Anion gap: 5 (ref 5–15)
BUN: 18 mg/dL (ref 8–23)
CO2: 23 mmol/L (ref 22–32)
Calcium: 8.4 mg/dL — ABNORMAL LOW (ref 8.9–10.3)
Chloride: 105 mmol/L (ref 98–111)
Creatinine, Ser: 1.4 mg/dL — ABNORMAL HIGH (ref 0.44–1.00)
GFR, Estimated: 37 mL/min — ABNORMAL LOW (ref >60)
Glucose, Bld: 100 mg/dL — ABNORMAL HIGH (ref 70–99)
Potassium: 4.3 mmol/L (ref 3.5–5.1)
Sodium: 133 mmol/L — ABNORMAL LOW (ref 135–145)

## 2021-12-01 LAB — URINALYSIS, COMPLETE (UACMP) WITH MICROSCOPIC
Bacteria, UA: NONE SEEN
Bilirubin Urine: NEGATIVE
Glucose, UA: NEGATIVE mg/dL
Hgb urine dipstick: NEGATIVE
Ketones, ur: NEGATIVE mg/dL
Leukocytes,Ua: NEGATIVE
Nitrite: NEGATIVE
Protein, ur: NEGATIVE mg/dL
Specific Gravity, Urine: 1.013 (ref 1.005–1.030)
pH: 5 (ref 5.0–8.0)

## 2021-12-01 LAB — SODIUM, URINE, RANDOM: Sodium, Ur: 109 mmol/L

## 2021-12-01 LAB — CREATININE, URINE, RANDOM: Creatinine, Urine: 80.59 mg/dL

## 2021-12-01 LAB — OSMOLALITY, URINE: Osmolality, Ur: 535 mOsm/kg (ref 300–900)

## 2021-12-01 MED ORDER — AMLODIPINE BESYLATE 5 MG PO TABS
5.0000 mg | ORAL_TABLET | Freq: Every day | ORAL | Status: DC
  Administered 2021-12-01: 5 mg via ORAL
  Filled 2021-12-01: qty 1

## 2021-12-01 MED ORDER — HYDRALAZINE HCL 50 MG PO TABS: 50.0000 mg | ORAL_TABLET | Freq: Three times a day (TID) | ORAL | 1 refills | Status: DC

## 2021-12-01 MED ORDER — AMLODIPINE BESYLATE 5 MG PO TABS: 5.0000 mg | ORAL_TABLET | Freq: Every day | ORAL | 1 refills | Status: DC

## 2021-12-01 NOTE — Progress Notes (Addendum)
Progress Note  Patient Name: Stephanie West Date of Encounter: 12/01/2021  [facility] Cardiologist: Carlyle Dolly, MD   Subjective   Reports mild nausea which was occurring prior to admission as well. She thinks this might have been occurring since prior dose adjustment of Coreg. No chest pain or palpitations. Breathing at baseline.   Inpatient Medications    Scheduled Meds:  cloNIDine  0.2 mg Transdermal Weekly   ezetimibe  10 mg Oral Daily   feeding supplement  237 mL Oral TID BM   guanFACINE  1 mg Oral Daily   heparin  5,000 Units Subcutaneous Q8H   hydrALAZINE  50 mg Oral TID   loratadine  10 mg Oral QHS   pantoprazole  40 mg Oral Daily   polyethylene glycol  17 g Oral Daily   polyethylene glycol  1 packet Oral BID   Continuous Infusions:  sodium chloride 75 mL/hr at 12/01/21 0053   PRN Meds: acetaminophen **OR** acetaminophen, albuterol, ondansetron **OR** ondansetron (ZOFRAN) IV, oxyCODONE   Vital Signs    Vitals:   11/30/21 1100 11/30/21 1602 11/30/21 2143 12/01/21 0536  BP: (!) 155/76 (!) 161/64 (!) 157/92 (!) 166/82  Pulse: 70 72 72 78  Resp: 16 16 16 15   Temp: (!) 97.3 F (36.3 C) 98.1 F (36.7 C) 98.3 F (36.8 C) 98.4 F (36.9 C)  TempSrc: Oral Oral    SpO2: 95% 91% 94% 93%  Weight:      Height:        Intake/Output Summary (Last 24 hours) at 12/01/2021 0753 Last data filed at 12/01/2021 0053 Gross per 24 hour  Intake 1595.67 ml  Output --  Net 1595.67 ml   Last 3 Weights 11/29/2021 10/20/2021 09/26/2021  Weight (lbs) 110 lb 111 lb 3.2 oz 114 lb 9.6 oz  Weight (kg) 49.896 kg 50.44 kg 51.982 kg      Telemetry    NSR, HR in 60's to 70's. Occasional PVC's.  - Personally Reviewed  ECG    Sinus bradycardia, HR 48 with no acute ST changes.  - Personally Reviewed  Physical Exam   GEN: Pleasant female appearing in no acute distress.   Neck: No JVD Cardiac: RRR, no murmurs, rubs, or gallops.  Respiratory: Clear to auscultation  bilaterally. GI: Soft, nontender, non-distended  MS: No pitting edema; No deformity. Neuro:  Nonfocal  Psych: Normal affect   Labs    High Sensitivity Troponin:  No results for input(s): TROPONINIHS in the last 720 hours.   Chemistry Recent Labs  Lab 11/29/21 1954 11/30/21 0331 12/01/21 0543  NA 129* 132* 133*  K 5.5* 5.0 4.3  CL 101 104 105  CO2 20* 24 23  GLUCOSE 168* 116* 100*  BUN 27* 24* 18  CREATININE 1.77* 1.55* 1.40*  CALCIUM 9.1 8.8* 8.4*  MG  --  1.8  --   PROT  --  5.8*  --   ALBUMIN  --  3.1*  --   AST  --  16  --   ALT  --  11  --   ALKPHOS  --  54  --   BILITOT  --  0.7  --   GFRNONAA 28* 33* 37*  ANIONGAP 8 4* 5    Lipids No results for input(s): CHOL, TRIG, HDL, LABVLDL, LDLCALC, CHOLHDL in the last 168 hours.  Hematology Recent Labs  Lab 11/29/21 1954 11/30/21 0331  WBC 8.8 4.7  RBC 3.82* 3.54*  HGB 12.3 11.3*  HCT 37.5 34.6*  MCV 98.2 97.7  MCH 32.2 31.9  MCHC 32.8 32.7  RDW 12.3 12.1  PLT 255 208   Thyroid  Recent Labs  Lab 11/30/21 0331  TSH 2.021    BNPNo results for input(s): BNP, PROBNP in the last 168 hours.  DDimer No results for input(s): DDIMER in the last 168 hours.   Radiology    CT Head Wo Contrast  Result Date: 11/29/2021 CLINICAL DATA:  Hit Dizziness, persistent/recurrent, cardiac or vascular cause suspected. Hypertension. Near syncope. EXAM: CT HEAD WITHOUT CONTRAST TECHNIQUE: Contiguous axial images were obtained from the base of the skull through the vertex without intravenous contrast. RADIATION DOSE REDUCTION: This exam was performed according to the departmental dose-optimization program which includes automated exposure control, adjustment of the mA and/or kV according to patient size and/or use of iterative reconstruction technique. COMPARISON:  01/18/2021.  MRI 01/17/2021. FINDINGS: Brain: Large left frontal meningioma again noted measuring 4.3 cm, unchanged since prior study. Mass effect on the adjacent left  frontal lobe. There is atrophy and chronic small vessel disease changes. No hemorrhage, hydrocephalus, acute infarction, or midline shift. Vascular: No hyperdense vessel or unexpected calcification. Skull: No acute calvarial abnormality. Sinuses/Orbits: No acute findings. Mucosal thickening throughout the paranasal sinuses. Other: None IMPRESSION: Large left frontal meningioma, unchanged since prior study. Atrophy, chronic microvascular disease. No acute intracranial abnormality. Electronically Signed   By: Rolm Baptise M.D.   On: 11/29/2021 20:33   Cardiac Studies   Echocardiogram: 11/30/2021 IMPRESSIONS     1. Left ventricular ejection fraction, by estimation, is 60 to 65%. The  left ventricle has normal function. The left ventricle has no regional  wall motion abnormalities. Left ventricular diastolic parameters are  consistent with Grade I diastolic  dysfunction (impaired relaxation).   2. Right ventricular systolic function is normal. The right ventricular  size is normal. There is normal pulmonary artery systolic pressure.   3. The mitral valve is degenerative. No evidence of mitral valve  regurgitation. No evidence of mitral stenosis.   4. The aortic valve is tricuspid. There is mild calcification of the  aortic valve. Aortic valve regurgitation is trivial. Aortic valve  sclerosis/calcification is present, without any evidence of aortic  stenosis.   5. The inferior vena cava is normal in size with greater than 50%  respiratory variability, suggesting right atrial pressure of 3 mmHg.   Patient Profile     81 y.o. female w/ PMH of HTN, palpitations (prior monitor showing sinus tachycardia), GERD and hyponatremia who presented to [facility] ED on 11/29/2021 for evaluation of dizziness with reported HR in the 30's to 40's at home.   Assessment & Plan    1. Sinus Bradycardia - Presented with worsening dizziness and episodes of bradycardia at home with HR in the 30's to 40's. TSH normal  at 2.021. She did have electrolyte abnormalities on admission and these have improved with IV fluids. Echo yesterday showed a preserved EF of 60-65% with no regional WMA and only trivial AI.  - She was on Coreg 6.25mg  BID and Cardizem CD 360mg  daily prior to admission with both being held. HR improved into the 60's to 70's and no evidence of high-grade block. Will review with Dr. Johnsie Cancel in regards to restarting Cardizem CD at a lower dose such as 120mg  daily to avoid rebound tachycardia or arranging for an outpatient monitor to assess rates going forward.   2. HLD - Followed by her PCP. She has been intolerant to statins and remains on Zetia.  3. HTN - Her BP has been elevated, at 166/82 on most recent check. She has been continued on her PTA Clonidine patch and Hydralazine 50mg  TID. If BP remains above goal, could titrate Hydralazine to 75mg  TID. PTA Olmesartan has been held given her AKI.   4. AKI/Hyperkalemia/Hyponatremia - Creatinine was at 1.77 on admission, improved to 1.40 today and K+ has normalized. She does have chronic hyponatremia but Na+ has improved from 129 on admission to 133.  For questions or updates, please contact [place] Please consult [email or web address] for contact info under        Signed, Erma Heritage, PA-C  12/01/2021, 7:53 AM    HR improved d/c beta blocker start norvasc 5 mg for BP has less AV nodal properties than cardizem Outpatient monitor will be arranged Echo with preserved EF and no RWMA  Jenkins Rouge MD [facility]

## 2021-12-01 NOTE — Care Management Important Message (Signed)
Important Message  Patient Details  Name: Stephanie West MRN: [id number] Date of Birth: October 30, 1940   Medicare Important Message Given:  N/A - LOS <3 / Initial given by admissions     Tommy Medal 12/01/2021, 10:45 AM

## 2021-12-01 NOTE — Plan of Care (Signed)

## 2021-12-01 NOTE — Discharge Summary (Signed)
Physician Discharge Summary   Patient: Stephanie West MRN: [id number] DOB: 1941/03/01  Admit date:     11/29/2021  Discharge date: 12/01/21  Discharge Physician: Shanon Brow Azizah Lisle   PCP: Celene Squibb, MD   Recommendations at discharge:    Repeat BMP in one week     Hospital Course: 81 year old female with a history of dysphagia, GERD, mitral valve prolapse, hypertension, CKD stage III, and hyponatremia presenting with 2 to 3-week history of near syncope and dizziness.  The patient states that she was started on carvedilol about 3 weeks ago.  She states that her dizziness and near syncopal episodes began after that.  It occurs usually when she is sitting down and lasts about 5 minutes.  She has some associated palpitations, but denies any actual frank syncope, chest pain, shortness of breath, vomiting, or focal extremity weakness or headache.  EMS noted the patient to have a heart rate in the 30s when they picked her up.  Heart rate was in the 40s initially in the ED and improved into the mid 50s subsequently. Patient states that she has had poor appetite and decreased oral intake for the better part of the last 6 months.  She states that she has never been a big eater. Initial BMP showed sodium 129, potassium 5.5, serum creatinine 1.77.  WBC 8.8, hemoglobin 12.3, platelet 55,000.  EKG shows sinus bradycardia with nonspecific T wave changes.  Patient was admitted for further evaluation and treatment of symptomatic bradycardia.  Coreg and diltiazem were discontinued with improvement of HR and dizziness.  She was started on IVF with improvement of her Na to 133 on day of d/c.  Clinically, pt is improved and PT did not recommend any follow up.  Diltiazem and coreg will not be restarted with amlodipine started in lieu.  She will follow up with cardiology outpt  Assessment and Plan: * Symptomatic bradycardia- (present on admission) near syncopal intermittently for 2-3 weeks HR in 30s at urgent care when  EMS pick her up Recently started on carvedilol Hold diltiazem and carvedilol>>improved to 60-70s at rest Appreciate cardiology>>d/c diltiazem and coreg altogether Optimize electrolytes TSH 2.201 Echo--EF 60-65%, no WMA, G1DD, normal RV  Acute renal failure superimposed on stage 3a chronic kidney disease (HCC) Baseline creatinine 1.0-1.3 Due to volume depletion Presented with serum creatinine 1.77 Hold olmesartan Continue IVF>>improved to 1.40  Hyperkalemia- (present on admission) Potassium 5.5 Secondary to AKI and olmesartan and Urocit-K supplement Hold olmesartan and potassium supplement Continue IVF Resolved--4.3 on day of dc  Hypertension- (present on admission) Holding Coreg and diltiazem in the setting of bradycardia>>will not restart Start amlodipine Continue hydralazine and clonidine Will not restart olmesartan due to pt's propensity for dehydration with AKI and hyperkalemia   Hyponatremia- (present on admission) Has been chronic Due to dehydration and poor solute intake Has been recurrent problem FeNa 1.43%, but pt already fluid resuscitated Continue IV NS>>133 on day of d/c  Acute renal failure (ARF) ([place]) Patient's creatinine normally 1.3, today 1.77 Multifactorial with poor p.o. intake contributing but also with hypotension that is likely associated with her sinus bradycardia when she is feeling near syncopal Continue gentle fluids Recheck in the a.m. Avoid nephrotoxic agents when possible  GERD (gastroesophageal reflux disease)- (present on admission) Continue Protonix           Consultants: cardiology Procedures performed: none  Disposition: Home Diet recommendation:  Cardiac diet  DISCHARGE MEDICATION: Allergies as of 12/01/2021       Reactions  Apple Other (See Comments)   Wheezing and Asthma Symptoms    Aspirin Other (See Comments)   Wheezing, Asthma Symptoms    Avelox [moxifloxacin Hcl In Nacl] Hives   Pt can take cipro   Bee Venom  Other (See Comments)   Wheezing, Asthma Symptoms   Benadryl [diphenhydramine Hcl] Other (See Comments)   Wheezing, Asthma Symptoms    Chicken Allergy Other (See Comments)   Wheezing and Asthma Symptoms    Codeine Other (See Comments)   Makes patient feel like she's going to pass out.    Factive [gemifloxacin Mesylate] Hives   Factive [gemifloxacin] Hives   Hctz [hydrochlorothiazide]    hyponatremia   Iodinated Contrast Media Other (See Comments)   Pt was told by allergist to avoid  Dye due to other allergies   Iodine    Levaquin [levofloxacin In D5w] Hives   Pt can take cipro   Levofloxacin Hives   Penicillins Other (See Comments)   Asthma Symptoms .Did it involve swelling of the face/tongue/throat, SOB, or low BP? No Did it involve sudden or severe rash/hives, skin peeling, or any reaction on the inside of your mouth or nose? Yes Did you need to seek medical attention at a hospital or doctor's office? Unknown When did it last happen?      Unkown If all above answers are NO, may proceed with cephalosporin use.   Sulfa Antibiotics Nausea Only   Tequin [gatifloxacin] Hives        Medication List     STOP taking these medications    carvedilol 6.25 MG tablet Commonly known as: COREG   diltiazem 360 MG 24 hr capsule Commonly known as: CARDIZEM CD   meclizine 25 MG tablet Commonly known as: ANTIVERT   olmesartan 40 MG tablet Commonly known as: BENICAR   ondansetron 4 MG tablet Commonly known as: ZOFRAN       TAKE these medications    acetaminophen 325 MG tablet Commonly known as: TYLENOL Take 2 tablets (650 mg total) by mouth every 6 (six) hours as needed for mild pain (or Fever >/= 101).   albuterol 108 (90 Base) MCG/ACT inhaler Commonly known as: VENTOLIN HFA Inhale 2 puffs into the lungs every 6 (six) hours as needed for wheezing or shortness of breath.   amLODipine 5 MG tablet Commonly known as: NORVASC Take 1 tablet (5 mg total) by mouth daily.    cloNIDine 0.2 mg/24hr patch Commonly known as: CATAPRES - Dosed in mg/24 hr Place 1 patch (0.2 mg total) onto the skin once a week. Every Thursday   esomeprazole 40 MG capsule Commonly known as: NEXIUM Take 1 capsule (40 mg total) by mouth daily before breakfast.   ezetimibe 10 MG tablet Commonly known as: ZETIA Take 10 mg by mouth daily.   feeding supplement Liqd Take 237 mLs by mouth 3 (three) times daily between meals.   guanFACINE 1 MG Tb24 ER tablet Commonly known as: INTUNIV Take 1 mg by mouth daily.   hydrALAZINE 50 MG tablet Commonly known as: APRESOLINE Take 1 tablet (50 mg total) by mouth 3 (three) times daily. What changed:  medication strength how much to take   levocetirizine 5 MG tablet Commonly known as: XYZAL Take 5 mg by mouth at bedtime.   loteprednol 0.2 % Susp Commonly known as: LOTEMAX Place 1 drop into both eyes daily.   Netarsudil Dimesylate 0.02 % Soln Place 1 drop into both eyes every evening.   polyethylene glycol powder 17 GM/SCOOP powder  Commonly known as: GLYCOLAX/MIRALAX Take 1 Container by mouth as needed for mild constipation.   potassium citrate 10 MEQ (1080 MG) SR tablet Commonly known as: UROCIT-K Take 10 mEq by mouth 3 (three) times daily with meals.         Discharge Exam: Filed Weights   11/29/21 1928  Weight: 49.9 kg   CV-RRR Lungs--CTA Abd-soft/NT+BS/ND Ext--no CCE  Condition at discharge: stable  The results of significant diagnostics from this hospitalization (including imaging, microbiology, ancillary and laboratory) are listed below for reference.   Imaging Studies: CT Head Wo Contrast  Result Date: 11/29/2021 CLINICAL DATA:  Hit Dizziness, persistent/recurrent, cardiac or vascular cause suspected. Hypertension. Near syncope. EXAM: CT HEAD WITHOUT CONTRAST TECHNIQUE: Contiguous axial images were obtained from the base of the skull through the vertex without intravenous contrast. RADIATION DOSE REDUCTION:  This exam was performed according to the departmental dose-optimization program which includes automated exposure control, adjustment of the mA and/or kV according to patient size and/or use of iterative reconstruction technique. COMPARISON:  01/18/2021.  MRI 01/17/2021. FINDINGS: Brain: Large left frontal meningioma again noted measuring 4.3 cm, unchanged since prior study. Mass effect on the adjacent left frontal lobe. There is atrophy and chronic small vessel disease changes. No hemorrhage, hydrocephalus, acute infarction, or midline shift. Vascular: No hyperdense vessel or unexpected calcification. Skull: No acute calvarial abnormality. Sinuses/Orbits: No acute findings. Mucosal thickening throughout the paranasal sinuses. Other: None IMPRESSION: Large left frontal meningioma, unchanged since prior study. Atrophy, chronic microvascular disease. No acute intracranial abnormality. Electronically Signed   By: Rolm Baptise M.D.   On: 11/29/2021 20:33   ECHOCARDIOGRAM COMPLETE  Result Date: 11/30/2021    ECHOCARDIOGRAM REPORT   Patient Name:   Stephanie West Date of Exam: 11/30/2021 Medical Rec #:  [id number]        Height:       64.0 in Accession #:    [id number]       Weight:       110.0 lb Date of Birth:  [birth year]        BSA:          1.517 m Patient Age:    77 years         BP:           189/62 mmHg Patient Gender: F                HR:           66 bpm. Exam Location:  [facility] Procedure: 2D Echo, Cardiac Doppler and Color Doppler Indications:    Syncope  History:        Patient has no prior history of Echocardiogram examinations.                 Risk Factors:Hypertension.  Sonographer:    Wenda Low Referring Phys: [phone] Geoffry Bannister IMPRESSIONS  1. Left ventricular ejection fraction, by estimation, is 60 to 65%. The left ventricle has normal function. The left ventricle has no regional wall motion abnormalities. Left ventricular diastolic parameters are consistent with Grade I diastolic dysfunction  (impaired relaxation).  2. Right ventricular systolic function is normal. The right ventricular size is normal. There is normal pulmonary artery systolic pressure.  3. The mitral valve is degenerative. No evidence of mitral valve regurgitation. No evidence of mitral stenosis.  4. The aortic valve is tricuspid. There is mild calcification of the aortic valve. Aortic valve regurgitation is trivial. Aortic valve sclerosis/calcification is present, without any  evidence of aortic stenosis.  5. The inferior vena cava is normal in size with greater than 50% respiratory variability, suggesting right atrial pressure of 3 mmHg. FINDINGS  Left Ventricle: Left ventricular ejection fraction, by estimation, is 60 to 65%. The left ventricle has normal function. The left ventricle has no regional wall motion abnormalities. The left ventricular internal cavity size was normal in size. There is  no left ventricular hypertrophy. Left ventricular diastolic parameters are consistent with Grade I diastolic dysfunction (impaired relaxation). Right Ventricle: The right ventricular size is normal. No increase in right ventricular wall thickness. Right ventricular systolic function is normal. There is normal pulmonary artery systolic pressure. The tricuspid regurgitant velocity is 2.33 m/s, and  with an assumed right atrial pressure of 3 mmHg, the estimated right ventricular systolic pressure is 19.4 mmHg. Left Atrium: Left atrial size was normal in size. Right Atrium: Right atrial size was normal in size. Pericardium: There is no evidence of pericardial effusion. Mitral Valve: The mitral valve is degenerative in appearance. There is mild thickening of the mitral valve leaflet(s). There is mild calcification of the mitral valve leaflet(s). Mild mitral annular calcification. No evidence of mitral valve regurgitation. No evidence of mitral valve stenosis. MV peak gradient, 6.7 mmHg. The mean mitral valve gradient is 2.0 mmHg. Tricuspid Valve:  The tricuspid valve is normal in structure. Tricuspid valve regurgitation is trivial. No evidence of tricuspid stenosis. Aortic Valve: The aortic valve is tricuspid. There is mild calcification of the aortic valve. Aortic valve regurgitation is trivial. Aortic valve sclerosis/calcification is present, without any evidence of aortic stenosis. Aortic valve mean gradient measures 3.0 mmHg. Aortic valve peak gradient measures 6.0 mmHg. Aortic valve area, by VTI measures 2.41 cm. Pulmonic Valve: The pulmonic valve was normal in structure. Pulmonic valve regurgitation is not visualized. No evidence of pulmonic stenosis. Aorta: The aortic root is normal in size and structure. Venous: The inferior vena cava is normal in size with greater than 50% respiratory variability, suggesting right atrial pressure of 3 mmHg. IAS/Shunts: No atrial level shunt detected by color flow Doppler.  LEFT VENTRICLE PLAX 2D LVIDd:         3.40 cm     Diastology LVIDs:         2.10 cm     LV e' medial:    6.31 cm/s LV PW:         0.90 cm     LV E/e' medial:  13.8 LV IVS:        0.90 cm     LV e' lateral:   8.38 cm/s LVOT diam:     2.00 cm     LV E/e' lateral: 10.4 LV SV:         67 LV SV Index:   44 LVOT Area:     3.14 cm  LV Volumes (MOD) LV vol d, MOD A2C: 41.3 ml LV vol d, MOD A4C: 45.9 ml LV vol s, MOD A2C: 17.0 ml LV vol s, MOD A4C: 11.8 ml LV SV MOD A2C:     24.3 ml LV SV MOD A4C:     45.9 ml LV SV MOD BP:      31.0 ml RIGHT VENTRICLE RV Basal diam:  3.60 cm RV Mid diam:    3.00 cm RV S prime:     19.60 cm/s TAPSE (M-mode): 2.9 cm LEFT ATRIUM             Index  RIGHT ATRIUM           Index LA diam:        2.90 cm 1.91 cm/m   RA Area:     15.80 cm LA Vol (A2C):   45.3 ml 29.85 ml/m  RA Volume:   43.90 ml  28.93 ml/m LA Vol (A4C):   44.4 ml 29.26 ml/m LA Biplane Vol: 47.3 ml 31.17 ml/m  AORTIC VALVE                    PULMONIC VALVE AV Area (Vmax):    2.22 cm     PV Vmax:       0.66 m/s AV Area (Vmean):   2.27 cm     PV Peak  grad:  1.8 mmHg AV Area (VTI):     2.41 cm AV Vmax:           122.00 cm/s AV Vmean:          82.200 cm/s AV VTI:            0.278 m AV Peak Grad:      6.0 mmHg AV Mean Grad:      3.0 mmHg LVOT Vmax:         86.20 cm/s LVOT Vmean:        59.400 cm/s LVOT VTI:          0.213 m LVOT/AV VTI ratio: 0.77  AORTA Ao Root diam: 3.00 cm MITRAL VALVE                TRICUSPID VALVE MV Area (PHT): 1.93 cm     TR Peak grad:   21.7 mmHg MV Area VTI:   1.97 cm     TR Vmax:        233.00 cm/s MV Peak grad:  6.7 mmHg MV Mean grad:  2.0 mmHg     SHUNTS MV Vmax:       1.29 m/s     Systemic VTI:  0.21 m MV Vmean:      69.2 cm/s    Systemic Diam: 2.00 cm MV Decel Time: 393 msec MV E velocity: 87.00 cm/s MV A velocity: 119.00 cm/s MV E/A ratio:  0.73 Jenkins Rouge MD Electronically signed by Jenkins Rouge MD Signature Date/Time: 11/30/2021/12:43:09 PM    Final     Microbiology: Results for orders placed or performed during the hospital encounter of 11/29/21  Resp Panel by RT-PCR (Flu A&B, Covid) Nasopharyngeal Swab     Status: None   Collection Time: 11/29/21 10:33 PM   Specimen: Nasopharyngeal Swab; Nasopharyngeal(NP) swabs in vial transport medium  Result Value Ref Range Status   SARS Coronavirus 2 by RT PCR NEGATIVE NEGATIVE Final    Comment: (NOTE) SARS-CoV-2 target nucleic acids are NOT DETECTED.  The SARS-CoV-2 RNA is generally detectable in upper respiratory specimens during the acute phase of infection. The lowest concentration of SARS-CoV-2 viral copies this assay can detect is 138 copies/mL. A negative result does not preclude SARS-Cov-2 infection and should not be used as the sole basis for treatment or other patient management decisions. A negative result may occur with  improper specimen collection/handling, submission of specimen other than nasopharyngeal swab, presence of viral mutation(s) within the areas targeted by this assay, and inadequate number of viral copies(<138 copies/mL). A negative result  must be combined with clinical observations, patient history, and epidemiological information. The expected result is Negative.  Fact Sheet for Patients:  EntrepreneurPulse.com.au  Fact Sheet for  Healthcare Providers:  IncredibleEmployment.be  This test is no t yet approved or cleared by the [place] and  has been authorized for detection and/or diagnosis of SARS-CoV-2 by FDA under an Emergency Use Authorization (EUA). This EUA will remain  in effect (meaning this test can be used) for the duration of the COVID-19 declaration under Section 564(b)(1) of the Act, 21 U.S.C.section 360bbb-3(b)(1), unless the authorization is terminated  or revoked sooner.       Influenza A by PCR NEGATIVE NEGATIVE Final   Influenza B by PCR NEGATIVE NEGATIVE Final    Comment: (NOTE) The Xpert Xpress SARS-CoV-2/FLU/RSV plus assay is intended as an aid in the diagnosis of influenza from Nasopharyngeal swab specimens and should not be used as a sole basis for treatment. Nasal washings and aspirates are unacceptable for Xpert Xpress SARS-CoV-2/FLU/RSV testing.  Fact Sheet for Patients: EntrepreneurPulse.com.au  Fact Sheet for Healthcare Providers: IncredibleEmployment.be  This test is not yet approved or cleared by the [place] FDA and has been authorized for detection and/or diagnosis of SARS-CoV-2 by FDA under an Emergency Use Authorization (EUA). This EUA will remain in effect (meaning this test can be used) for the duration of the COVID-19 declaration under Section 564(b)(1) of the Act, 21 U.S.C. section 360bbb-3(b)(1), unless the authorization is terminated or revoked.  Performed at [facility], [address], [place], [place] [address]     Labs: CBC: Recent Labs  Lab 11/29/21 1954 11/30/21 0331  WBC 8.8 4.7  HGB 12.3 11.3*  HCT 37.5 34.6*  MCV 98.2 97.7  PLT 255 638   Basic Metabolic  Panel: Recent Labs  Lab 11/29/21 1954 11/30/21 0331 12/01/21 0543  NA 129* 132* 133*  K 5.5* 5.0 4.3  CL 101 104 105  CO2 20* 24 23  GLUCOSE 168* 116* 100*  BUN 27* 24* 18  CREATININE 1.77* 1.55* 1.40*  CALCIUM 9.1 8.8* 8.4*  MG  --  1.8  --    Liver Function Tests: Recent Labs  Lab 11/30/21 0331  AST 16  ALT 11  ALKPHOS 54  BILITOT 0.7  PROT 5.8*  ALBUMIN 3.1*   CBG: Recent Labs  Lab 11/29/21 1937  GLUCAP 175*    Discharge time spent: greater than 30 minutes.  Signed: Orson Eva, MD [facility] 12/01/2021

## 2021-12-03 NOTE — ED Provider Notes (Signed)
[facility]    CSN: [id number] Arrival date & time: 11/29/21  1820      History   Chief Complaint No chief complaint on file.   HPI Stephanie West is a 81 y.o. female.   Presenting today with 1 week of dizzy spells, feeling like she is going to pass out, and intermittent SOB. Denies CP, palpitations, nausea/vomiting, headache, vision changes, head injury, mental status changes, fever, chills, recent illness. Not trying anything OTC for sxs. Hx of HTN, asthma, CKD.     Past Medical History:  Diagnosis Date   Asthma    Dysphagia 05/10/2017   Elevated transaminase level    GERD (gastroesophageal reflux disease)    Hypertension    MVP (mitral valve prolapse)     Patient Active Problem List   Diagnosis Date Noted   Hyperkalemia 11/30/2021   Acute renal failure superimposed on stage 3a chronic kidney disease ([place]) 11/30/2021   Symptomatic bradycardia 11/29/2021   Hypertension 10/20/2021   Constipation 06/16/2021   Anorexia    Malnutrition of moderate degree 02/03/2021   Hypochloremia 02/02/2021   Anxiety 66/03/3015   Acute metabolic encephalopathy 10/31/3233   Schatzki's ring 06/15/2020   Hyponatremia 07/02/2019   Salmonella gastroenteritis 02/11/2016   Chronic renal failure, stage 3 (moderate) ([place]) 02/10/2016   Generalized anxiety disorder 02/10/2016   Acute renal failure (ARF) ([place]) 02/08/2016   Nausea without vomiting 02/08/2016   Intractable nausea and vomiting 02/08/2016   GERD (gastroesophageal reflux disease) 02/13/2012   Asthma 02/13/2012   Hypertensive crisis 02/13/2012   Transaminitis 02/13/2012    Past Surgical History:  Procedure Laterality Date   COLONOSCOPY     ESOPHAGEAL DILATION  07/30/2015   Procedure: ESOPHAGEAL DILATION;  Surgeon: Rogene Houston, MD;  [facility];  Service: Endoscopy;;   ESOPHAGOGASTRODUODENOSCOPY N/A 04/15/2015   Procedure: ESOPHAGOGASTRODUODENOSCOPY (EGD);  Surgeon: Rogene Houston, MD;  Location:  [facility];  Service: Endoscopy;  Laterality: N/A;  125 - moved to 1:00, Ann to notify pt   ESOPHAGOGASTRODUODENOSCOPY N/A 07/30/2015   Procedure: ESOPHAGOGASTRODUODENOSCOPY (EGD);  Surgeon: Rogene Houston, MD;  Location: [facility];  Service: Endoscopy;  Laterality: N/A;  1040   ESOPHAGOGASTRODUODENOSCOPY (EGD) WITH ESOPHAGEAL DILATION N/A 03/07/2013   Procedure: ESOPHAGOGASTRODUODENOSCOPY (EGD) WITH ESOPHAGEAL DILATION;  Surgeon: Rogene Houston, MD;  Location: [facility];  Service: Endoscopy;  Laterality: N/A;  [place]     LAPAROSCOPIC TUBAL LIGATION  1984   UPPER GASTROINTESTINAL ENDOSCOPY      OB History   No obstetric history on file.      Home Medications    Prior to Admission medications   Medication Sig Start Date End Date Taking? Authorizing Provider  acetaminophen (TYLENOL) 325 MG tablet Take 2 tablets (650 mg total) by mouth every 6 (six) hours as needed for mild pain (or Fever >/= 101). 02/11/21   Emokpae, Courage, MD  albuterol (PROVENTIL HFA;VENTOLIN HFA) 108 (90 BASE) MCG/ACT inhaler Inhale 2 puffs into the lungs every 6 (six) hours as needed for wheezing or shortness of breath.    [provider]  amLODipine (NORVASC) 5 MG tablet Take 1 tablet (5 mg total) by mouth daily. 12/01/21   Orson Eva, MD  cloNIDine (CATAPRES - DOSED IN MG/24 HR) 0.2 mg/24hr patch Place 1 patch (0.2 mg total) onto the skin once a week. Every Thursday 02/17/21   Roxan Hockey, MD  esomeprazole (NEXIUM) 40 MG capsule Take 1 capsule (40 mg  total) by mouth daily before breakfast. 06/16/21   Montez Morita, Quillian Quince, MD  ezetimibe (ZETIA) 10 MG tablet Take 10 mg by mouth daily.    [provider]  feeding supplement (ENSURE ENLIVE / ENSURE PLUS) LIQD Take 237 mLs by mouth 3 (three) times daily between meals. 02/11/21   Roxan Hockey, MD  guanFACINE (INTUNIV) 1 MG TB24 ER tablet Take 1 mg by mouth daily. 04/01/20   [provider]  hydrALAZINE (APRESOLINE)  50 MG tablet Take 1 tablet (50 mg total) by mouth 3 (three) times daily. 12/01/21   Orson Eva, MD  levocetirizine (XYZAL) 5 MG tablet Take 5 mg by mouth at bedtime. 10/14/21   [provider]  loteprednol (LOTEMAX) 0.2 % SUSP Place 1 drop into both eyes daily.     [provider]  Netarsudil Dimesylate 0.02 % SOLN Place 1 drop into both eyes every evening.    [provider]  polyethylene glycol powder (GLYCOLAX/MIRALAX) 17 GM/SCOOP powder Take 1 Container by mouth as needed for mild constipation.    [provider]  potassium citrate (UROCIT-K) 10 MEQ (1080 MG) SR tablet Take 10 mEq by mouth 3 (three) times daily with meals.    [provider]    Family History Family History  Problem Relation Age of Onset   Hypertension Mother    Prostate cancer Father     Social History Social History   Tobacco Use   Smoking status: Never   Smokeless tobacco: Never  Vaping Use   Vaping Use: Never used  Substance Use Topics   Alcohol use: No    Alcohol/week: 0.0 standard drinks   Drug use: No     Allergies   Apple, Aspirin, Avelox [moxifloxacin hcl in nacl], Bee venom, Benadryl [diphenhydramine hcl], Chicken allergy, Codeine, Factive [gemifloxacin mesylate], Factive [gemifloxacin], Hctz [hydrochlorothiazide], Iodinated contrast media, Iodine, Levaquin [levofloxacin in d5w], Levofloxacin, Penicillins, Sulfa antibiotics, and Tequin [gatifloxacin]   Review of Systems Review of Systems PER HPI  Physical Exam Triage Vital Signs ED Triage Vitals [11/29/21 1830]  Enc Vitals Group     BP (!) 153/69     Pulse Rate 61     Resp 16     Temp (!) 97.5 F (36.4 C)     Temp Source Oral     SpO2 94 %     Weight      Height      Head Circumference      Peak Flow      Pain Score      Pain Loc      Pain Edu?      Excl. in [place]?    No data found.  Updated Vital Signs BP (!) 153/69 (BP Location: Left Arm)    Pulse 61    Temp (!) 97.5 F (36.4 C)  (Oral)    Resp 16    SpO2 94%   Visual Acuity Right Eye Distance:   Left Eye Distance:   Bilateral Distance:    Right Eye Near:   Left Eye Near:    Bilateral Near:      Exam abbreviated as determination was already made to go to ED prior to exam based on sxs, vital signs and EKG findings Physical Exam Vitals and nursing note reviewed.  Constitutional:      Appearance: Normal appearance. She is not ill-appearing.  HENT:     Head: Atraumatic.  Eyes:     Extraocular Movements: Extraocular movements intact.  Conjunctiva/sclera: Conjunctivae normal.  Cardiovascular:     Rate and Rhythm: Normal rate.  Pulmonary:     Effort: Pulmonary effort is normal.     Breath sounds: Normal breath sounds.  Musculoskeletal:        General: Normal range of motion.     Cervical back: Normal range of motion and neck supple.  Skin:    General: Skin is warm and dry.  Neurological:     Mental Status: She is alert. Mental status is at baseline.     Motor: No weakness.     Gait: Gait normal.  Psychiatric:        Mood and Affect: Mood normal.        Thought Content: Thought content normal.        Judgment: Judgment normal.     UC Treatments / Results  Labs (all labs ordered are listed, but only abnormal results are displayed) Labs Reviewed - No data to display  EKG   Radiology No results found.  Procedures Procedures (including critical care time)  Medications Ordered in UC Medications - No data to display  Initial Impression / Assessment and Plan / UC Course  I have reviewed the triage vital signs and the nursing notes.  Pertinent labs & imaging results that were available during my care of the patient were reviewed by me and considered in my medical decision making (see chart for details).     Hypertensive in triage, otherwise vital signs overall reassuring. EKG showing junctional bradycardia at 38 bpm which is new since previous EKG in chart. Given her sxs and abnormal EKG  recommend ED for further evaluation. She declines EMS and her daughter who presents with her is agreeable to private vehicle transport of patient.   Final Clinical Impressions(s) / UC Diagnoses   Final diagnoses:  Dizziness  Pre-syncope  SOB (shortness of breath)  Abnormal EKG   Discharge Instructions   None    ED Prescriptions   None    PDMP not reviewed this encounter.   Volney American, [place] 12/03/21 2322

## 2021-12-04 LAB — URINE CULTURE: Culture: 10000 — AB

## 2021-12-07 ENCOUNTER — Other Ambulatory Visit: Payer: Self-pay

## 2021-12-07 ENCOUNTER — Emergency Department (HOSPITAL_COMMUNITY)
Admission: EM | Admit: 2021-12-07 | Discharge: 2021-12-08 | Disposition: A | Discharge status: Home / Self Care | Payer: Medicare Other | Attending: Emergency Medicine | Admitting: Emergency Medicine

## 2021-12-07 DIAGNOSIS — R791 Abnormal coagulation profile: Secondary | ICD-10-CM | POA: Diagnosis not present

## 2021-12-07 DIAGNOSIS — F419 Anxiety disorder, unspecified: Secondary | ICD-10-CM | POA: Diagnosis not present

## 2021-12-07 DIAGNOSIS — R002 Palpitations: Secondary | ICD-10-CM | POA: Diagnosis not present

## 2021-12-07 DIAGNOSIS — N390 Urinary tract infection, site not specified: Secondary | ICD-10-CM | POA: Diagnosis not present

## 2021-12-07 DIAGNOSIS — R000 Tachycardia, unspecified: Secondary | ICD-10-CM | POA: Diagnosis not present

## 2021-12-07 DIAGNOSIS — I10 Essential (primary) hypertension: Secondary | ICD-10-CM | POA: Diagnosis not present

## 2021-12-07 LAB — URINALYSIS, ROUTINE W REFLEX MICROSCOPIC
Bilirubin Urine: NEGATIVE
Glucose, UA: NEGATIVE mg/dL
Hgb urine dipstick: NEGATIVE
Ketones, ur: NEGATIVE mg/dL
Nitrite: NEGATIVE
Protein, ur: NEGATIVE mg/dL
Specific Gravity, Urine: 1.016 (ref 1.005–1.030)
pH: 5 (ref 5.0–8.0)

## 2021-12-07 LAB — CBC
HCT: 38.8 % (ref 36.0–46.0)
Hemoglobin: 12.6 g/dL (ref 12.0–15.0)
MCH: 31.6 pg (ref 26.0–34.0)
MCHC: 32.5 g/dL (ref 30.0–36.0)
MCV: 97.2 fL (ref 80.0–100.0)
Platelets: 252 10*3/uL (ref 150–400)
RBC: 3.99 MIL/uL (ref 3.87–5.11)
RDW: 12.5 % (ref 11.5–15.5)
WBC: 7.9 10*3/uL (ref 4.0–10.5)
nRBC: 0 % (ref 0.0–0.2)

## 2021-12-07 LAB — BASIC METABOLIC PANEL
Anion gap: 12 (ref 5–15)
BUN: 19 mg/dL (ref 8–23)
CO2: 18 mmol/L — ABNORMAL LOW (ref 22–32)
Calcium: 9.1 mg/dL (ref 8.9–10.3)
Chloride: 104 mmol/L (ref 98–111)
Creatinine, Ser: 1.44 mg/dL — ABNORMAL HIGH (ref 0.44–1.00)
GFR, Estimated: 36 mL/min — ABNORMAL LOW (ref >60)
Glucose, Bld: 138 mg/dL — ABNORMAL HIGH (ref 70–99)
Potassium: 4 mmol/L (ref 3.5–5.1)
Sodium: 134 mmol/L — ABNORMAL LOW (ref 135–145)

## 2021-12-07 LAB — TROPONIN I (HIGH SENSITIVITY)
Troponin I (High Sensitivity): 8 ng/L (ref <18)
Troponin I (High Sensitivity): 12 ng/L (ref <18)

## 2021-12-07 LAB — PROTIME-INR
INR: 0.9 (ref 0.8–1.2)
Prothrombin Time: 12.6 seconds (ref 11.4–15.2)

## 2021-12-07 MED ORDER — CEPHALEXIN 500 MG PO CAPS: 500.0000 mg | ORAL_CAPSULE | Freq: Three times a day (TID) | ORAL | 0 refills | Status: DC

## 2021-12-07 MED ORDER — CEPHALEXIN 500 MG PO CAPS
500.0000 mg | ORAL_CAPSULE | Freq: Once | ORAL | Status: AC
  Administered 2021-12-07: 500 mg via ORAL
  Filled 2021-12-07: qty 1

## 2021-12-07 NOTE — ED Notes (Signed)
Ambulatory to restroom with standby assist. Urine sample provided.

## 2021-12-07 NOTE — ED Notes (Signed)
EDP in room  

## 2021-12-07 NOTE — ED Notes (Addendum)
Gave water to encourage sample; instructed to call when needing to void.

## 2021-12-07 NOTE — ED Triage Notes (Signed)
Pt recently taken off of 2 bp meds after recent hospitalization for bradycardia.

## 2021-12-07 NOTE — ED Provider Notes (Signed)
[facility] EMERGENCY DEPARTMENT Provider Note   CSN: [id number] Arrival date & time: 12/07/21  1903     History  Chief Complaint  Patient presents with   Tachycardia    Stephanie West is a 81 y.o. female.  Patient c/o feeling anxious/nervous today, and states her heart rate was high. Symptoms acute onset this AM, at rest, moderate, persistent. No syncope. No chest pain or discomfort. No sob. Has been inconsistent in taking meds this week, but pt unsure of specifics. Denies fever or chills. No cough or uri symptoms. No abd pain or nvd. No rectal bleeding or melena. No dysuria. No leg pain or swelling.   The history is provided by the patient, a relative and medical records.      Home Medications Prior to Admission medications   Medication Sig Start Date End Date Taking? Authorizing Provider  acetaminophen (TYLENOL) 325 MG tablet Take 2 tablets (650 mg total) by mouth every 6 (six) hours as needed for mild pain (or Fever >/= 101). 02/11/21   Emokpae, Courage, MD  albuterol (PROVENTIL HFA;VENTOLIN HFA) 108 (90 BASE) MCG/ACT inhaler Inhale 2 puffs into the lungs every 6 (six) hours as needed for wheezing or shortness of breath.    [provider]  amLODipine (NORVASC) 5 MG tablet Take 1 tablet (5 mg total) by mouth daily. 12/01/21   Orson Eva, MD  cloNIDine (CATAPRES - DOSED IN MG/24 HR) 0.2 mg/24hr patch Place 1 patch (0.2 mg total) onto the skin once a week. Every Thursday 02/17/21   Roxan Hockey, MD  esomeprazole (NEXIUM) 40 MG capsule Take 1 capsule (40 mg total) by mouth daily before breakfast. 06/16/21   Montez Morita, Quillian Quince, MD  ezetimibe (ZETIA) 10 MG tablet Take 10 mg by mouth daily.    [provider]  feeding supplement (ENSURE ENLIVE / ENSURE PLUS) LIQD Take 237 mLs by mouth 3 (three) times daily between meals. 02/11/21   Roxan Hockey, MD  guanFACINE (INTUNIV) 1 MG TB24 ER tablet Take 1 mg by mouth daily. 04/01/20   [provider]   hydrALAZINE (APRESOLINE) 50 MG tablet Take 1 tablet (50 mg total) by mouth 3 (three) times daily. 12/01/21   Orson Eva, MD  levocetirizine (XYZAL) 5 MG tablet Take 5 mg by mouth at bedtime. 10/14/21   [provider]  loteprednol (LOTEMAX) 0.2 % SUSP Place 1 drop into both eyes daily.     [provider]  Netarsudil Dimesylate 0.02 % SOLN Place 1 drop into both eyes every evening.    [provider]  polyethylene glycol powder (GLYCOLAX/MIRALAX) 17 GM/SCOOP powder Take 1 Container by mouth as needed for mild constipation.    [provider]  potassium citrate (UROCIT-K) 10 MEQ (1080 MG) SR tablet Take 10 mEq by mouth 3 (three) times daily with meals.    [provider]      Allergies    Apple juice, Aspirin, Avelox [moxifloxacin hcl in nacl], Bee venom, Benadryl [diphenhydramine hcl], Chicken allergy, Codeine, Factive [gemifloxacin mesylate], Factive [gemifloxacin], Hctz [hydrochlorothiazide], Iodinated contrast media, Iodine, Levaquin [levofloxacin in d5w], Levofloxacin, Penicillin v, Penicillins, Sulfa antibiotics, and Tequin [gatifloxacin]    Review of Systems   Review of Systems  Constitutional:  Negative for chills and fever.  HENT:  Negative for sore throat.   Eyes:  Negative for redness.  Respiratory:  Negative for cough and shortness of breath.   Cardiovascular:  Negative for chest pain and leg swelling.  Gastrointestinal:  Negative for abdominal  pain, blood in stool, diarrhea and vomiting.  Genitourinary:  Negative for dysuria and flank pain.  Musculoskeletal:  Negative for back pain and neck pain.  Skin:  Negative for rash.  Neurological:  Negative for syncope and headaches.  Hematological:  Does not bruise/bleed easily.  Psychiatric/Behavioral:  Negative for confusion.    Physical Exam Updated Vital Signs BP (!) 187/82    Pulse 96    Temp 98 F (36.7 C) (Oral)    Resp 16    Ht 1.626 m (5\' 4" )    Wt 50 kg    SpO2 94%    BMI 18.92  kg/m  Physical Exam Vitals and nursing note reviewed.  Constitutional:      Appearance: Normal appearance. She is well-developed.  HENT:     Head: Atraumatic.     Nose: Nose normal.     Mouth/Throat:     Mouth: Mucous membranes are moist.  Eyes:     General: No scleral icterus.    Conjunctiva/sclera: Conjunctivae normal.  Neck:     Trachea: No tracheal deviation.     Comments: Trachea midline. Thyroid not grossly enlarged or tender.  Cardiovascular:     Rate and Rhythm: Normal rate and regular rhythm.     Pulses: Normal pulses.     Heart sounds: Normal heart sounds. No murmur heard.   No friction rub. No gallop.  Pulmonary:     Effort: Pulmonary effort is normal. No respiratory distress.     Breath sounds: Normal breath sounds.  Abdominal:     General: Bowel sounds are normal. There is no distension.     Palpations: Abdomen is soft. There is no mass.     Tenderness: There is no abdominal tenderness. There is no guarding.  Genitourinary:    Comments: No cva tenderness.  Musculoskeletal:        General: No swelling or tenderness.     Cervical back: Normal range of motion and neck supple. No rigidity. No muscular tenderness.     Right lower leg: No edema.     Left lower leg: No edema.  Skin:    General: Skin is warm and dry.     Findings: No rash.  Neurological:     Mental Status: She is alert.     Comments: Alert, speech normal. Motor/sens grossly intact bil.   Psychiatric:        Mood and Affect: Mood normal.    ED Results / Procedures / Treatments   Labs (all labs ordered are listed, but only abnormal results are displayed) Results for orders placed or performed during the hospital encounter of 50/56/97  Basic metabolic panel  Result Value Ref Range   Sodium 134 (L) 135 - 145 mmol/L   Potassium 4.0 3.5 - 5.1 mmol/L   Chloride 104 98 - 111 mmol/L   CO2 18 (L) 22 - 32 mmol/L   Glucose, Bld 138 (H) 70 - 99 mg/dL   BUN 19 8 - 23 mg/dL   Creatinine, Ser 1.44 (H)  0.44 - 1.00 mg/dL   Calcium 9.1 8.9 - 10.3 mg/dL   GFR, Estimated 36 (L) >60 mL/min   Anion gap 12 5 - 15  CBC  Result Value Ref Range   WBC 7.9 4.0 - 10.5 K/uL   RBC 3.99 3.87 - 5.11 MIL/uL   Hemoglobin 12.6 12.0 - 15.0 g/dL   HCT 38.8 36.0 - 46.0 %   MCV 97.2 80.0 - 100.0 fL   MCH 31.6 26.0 -  34.0 pg   MCHC 32.5 30.0 - 36.0 g/dL   RDW 12.5 11.5 - 15.5 %   Platelets 252 150 - 400 K/uL   nRBC 0.0 0.0 - 0.2 %  Protime-INR- (order if Patient is taking Coumadin / Warfarin)  Result Value Ref Range   Prothrombin Time 12.6 11.4 - 15.2 seconds   INR 0.9 0.8 - 1.2  Troponin I (High Sensitivity)  Result Value Ref Range   Troponin I (High Sensitivity) 8 <18 ng/L   CT Head Wo Contrast  Result Date: 11/29/2021 CLINICAL DATA:  Hit Dizziness, persistent/recurrent, cardiac or vascular cause suspected. Hypertension. Near syncope. EXAM: CT HEAD WITHOUT CONTRAST TECHNIQUE: Contiguous axial images were obtained from the base of the skull through the vertex without intravenous contrast. RADIATION DOSE REDUCTION: This exam was performed according to the departmental dose-optimization program which includes automated exposure control, adjustment of the mA and/or kV according to patient size and/or use of iterative reconstruction technique. COMPARISON:  01/18/2021.  MRI 01/17/2021. FINDINGS: Brain: Large left frontal meningioma again noted measuring 4.3 cm, unchanged since prior study. Mass effect on the adjacent left frontal lobe. There is atrophy and chronic small vessel disease changes. No hemorrhage, hydrocephalus, acute infarction, or midline shift. Vascular: No hyperdense vessel or unexpected calcification. Skull: No acute calvarial abnormality. Sinuses/Orbits: No acute findings. Mucosal thickening throughout the paranasal sinuses. Other: None IMPRESSION: Large left frontal meningioma, unchanged since prior study. Atrophy, chronic microvascular disease. No acute intracranial abnormality. Electronically  Signed   By: Rolm Baptise M.D.   On: 11/29/2021 20:33   ECHOCARDIOGRAM COMPLETE  Result Date: 11/30/2021    ECHOCARDIOGRAM REPORT   Patient Name:   INNOCENCE SCHLOTZHAUER Date of Exam: 11/30/2021 Medical Rec #:  [id number]        Height:       64.0 in Accession #:    [id number]       Weight:       110.0 lb Date of Birth:  27-Feb-1941        BSA:          1.517 m Patient Age:    49 years         BP:           189/62 mmHg Patient Gender: F                HR:           66 bpm. Exam Location:  [facility] Procedure: 2D Echo, Cardiac Doppler and Color Doppler Indications:    Syncope  History:        Patient has no prior history of Echocardiogram examinations.                 Risk Factors:Hypertension.  Sonographer:    Wenda Low Referring Phys: [phone] DAVID TAT IMPRESSIONS  1. Left ventricular ejection fraction, by estimation, is 60 to 65%. The left ventricle has normal function. The left ventricle has no regional wall motion abnormalities. Left ventricular diastolic parameters are consistent with Grade I diastolic dysfunction (impaired relaxation).  2. Right ventricular systolic function is normal. The right ventricular size is normal. There is normal pulmonary artery systolic pressure.  3. The mitral valve is degenerative. No evidence of mitral valve regurgitation. No evidence of mitral stenosis.  4. The aortic valve is tricuspid. There is mild calcification of the aortic valve. Aortic valve regurgitation is trivial. Aortic valve sclerosis/calcification is present, without any evidence of aortic stenosis.  5. The inferior vena cava  is normal in size with greater than 50% respiratory variability, suggesting right atrial pressure of 3 mmHg. FINDINGS  Left Ventricle: Left ventricular ejection fraction, by estimation, is 60 to 65%. The left ventricle has normal function. The left ventricle has no regional wall motion abnormalities. The left ventricular internal cavity size was normal in size. There is  no left ventricular  hypertrophy. Left ventricular diastolic parameters are consistent with Grade I diastolic dysfunction (impaired relaxation). Right Ventricle: The right ventricular size is normal. No increase in right ventricular wall thickness. Right ventricular systolic function is normal. There is normal pulmonary artery systolic pressure. The tricuspid regurgitant velocity is 2.33 m/s, and  with an assumed right atrial pressure of 3 mmHg, the estimated right ventricular systolic pressure is 25.0 mmHg. Left Atrium: Left atrial size was normal in size. Right Atrium: Right atrial size was normal in size. Pericardium: There is no evidence of pericardial effusion. Mitral Valve: The mitral valve is degenerative in appearance. There is mild thickening of the mitral valve leaflet(s). There is mild calcification of the mitral valve leaflet(s). Mild mitral annular calcification. No evidence of mitral valve regurgitation. No evidence of mitral valve stenosis. MV peak gradient, 6.7 mmHg. The mean mitral valve gradient is 2.0 mmHg. Tricuspid Valve: The tricuspid valve is normal in structure. Tricuspid valve regurgitation is trivial. No evidence of tricuspid stenosis. Aortic Valve: The aortic valve is tricuspid. There is mild calcification of the aortic valve. Aortic valve regurgitation is trivial. Aortic valve sclerosis/calcification is present, without any evidence of aortic stenosis. Aortic valve mean gradient measures 3.0 mmHg. Aortic valve peak gradient measures 6.0 mmHg. Aortic valve area, by VTI measures 2.41 cm. Pulmonic Valve: The pulmonic valve was normal in structure. Pulmonic valve regurgitation is not visualized. No evidence of pulmonic stenosis. Aorta: The aortic root is normal in size and structure. Venous: The inferior vena cava is normal in size with greater than 50% respiratory variability, suggesting right atrial pressure of 3 mmHg. IAS/Shunts: No atrial level shunt detected by color flow Doppler.  LEFT VENTRICLE PLAX 2D  LVIDd:         3.40 cm     Diastology LVIDs:         2.10 cm     LV e' medial:    6.31 cm/s LV PW:         0.90 cm     LV E/e' medial:  13.8 LV IVS:        0.90 cm     LV e' lateral:   8.38 cm/s LVOT diam:     2.00 cm     LV E/e' lateral: 10.4 LV SV:         67 LV SV Index:   44 LVOT Area:     3.14 cm  LV Volumes (MOD) LV vol d, MOD A2C: 41.3 ml LV vol d, MOD A4C: 45.9 ml LV vol s, MOD A2C: 17.0 ml LV vol s, MOD A4C: 11.8 ml LV SV MOD A2C:     24.3 ml LV SV MOD A4C:     45.9 ml LV SV MOD BP:      31.0 ml RIGHT VENTRICLE RV Basal diam:  3.60 cm RV Mid diam:    3.00 cm RV S prime:     19.60 cm/s TAPSE (M-mode): 2.9 cm LEFT ATRIUM             Index        RIGHT ATRIUM  Index LA diam:        2.90 cm 1.91 cm/m   RA Area:     15.80 cm LA Vol (A2C):   45.3 ml 29.85 ml/m  RA Volume:   43.90 ml  28.93 ml/m LA Vol (A4C):   44.4 ml 29.26 ml/m LA Biplane Vol: 47.3 ml 31.17 ml/m  AORTIC VALVE                    PULMONIC VALVE AV Area (Vmax):    2.22 cm     PV Vmax:       0.66 m/s AV Area (Vmean):   2.27 cm     PV Peak grad:  1.8 mmHg AV Area (VTI):     2.41 cm AV Vmax:           122.00 cm/s AV Vmean:          82.200 cm/s AV VTI:            0.278 m AV Peak Grad:      6.0 mmHg AV Mean Grad:      3.0 mmHg LVOT Vmax:         86.20 cm/s LVOT Vmean:        59.400 cm/s LVOT VTI:          0.213 m LVOT/AV VTI ratio: 0.77  AORTA Ao Root diam: 3.00 cm MITRAL VALVE                TRICUSPID VALVE MV Area (PHT): 1.93 cm     TR Peak grad:   21.7 mmHg MV Area VTI:   1.97 cm     TR Vmax:        233.00 cm/s MV Peak grad:  6.7 mmHg MV Mean grad:  2.0 mmHg     SHUNTS MV Vmax:       1.29 m/s     Systemic VTI:  0.21 m MV Vmean:      69.2 cm/s    Systemic Diam: 2.00 cm MV Decel Time: 393 msec MV E velocity: 87.00 cm/s MV A velocity: 119.00 cm/s MV E/A ratio:  0.73 Jenkins Rouge MD Electronically signed by Jenkins Rouge MD Signature Date/Time: 11/30/2021/12:43:09 PM    Final     EKG EKG Interpretation  Date/Time:  Wednesday  December 07 2021 20:02:45 EST Ventricular Rate:  100 PR Interval:  129 QRS Duration: 100 QT Interval:  341 QTC Calculation: 440 R Axis:   70 Text Interpretation: Sinus tachycardia Confirmed by Lajean Saver [phone]) on 12/07/2021 8:04:54 PM  Radiology No results found.  Procedures Procedures    Medications Ordered in ED Medications - No data to display  ED Course/ Medical Decision Making/ A&P                           Medical Decision Making Amount and/or Complexity of Data Reviewed Labs: ordered.  Risk Prescription drug management.   Iv ns. Continuous pulse ox and cardiac monitoring. Stat labs. Ecg.   Reviewed nursing notes and prior charts for additional history. External reports reviewed.   Labs reviewed/interpreted by me - k normal, wbc and hgb normal.   Additional hx from family member.   Cardiac monitor: sinus rhythm, rate 94.   Recheck pt, tolerating po, no pain, no cp, no abd pain. Breathing comfortably, no distress.   Pt currently appears stable for d/c.  Rec close pcp f/u.  Return precautions provided.  Final Clinical Impression(s) / ED Diagnoses Final diagnoses:  None    Rx / DC Orders ED Discharge Orders     None         Lajean Saver, MD 12/11/21 1512

## 2021-12-07 NOTE — Discharge Instructions (Addendum)
It was our pleasure to provide your ER care today - we hope that you feel better.  Drink plenty of fluids/stay well hydrated. Get adequate nutrition. Take meds as prescribed by your doctor.   The lab tests show a possible urine infection - take antibiotic as prescribed.   Follow up with your doctor in the next 1-2 weeks - call office to arrange appointment. Also have blood pressure rechecked then, as it his high this evening.   Return to ER if worse, new symptoms, fevers, new/severe pain, chest pain, trouble breathing, or other concern.

## 2021-12-22 DIAGNOSIS — N179 Acute kidney failure, unspecified: Secondary | ICD-10-CM | POA: Diagnosis not present

## 2021-12-22 DIAGNOSIS — E875 Hyperkalemia: Secondary | ICD-10-CM | POA: Diagnosis not present

## 2021-12-22 DIAGNOSIS — R001 Bradycardia, unspecified: Secondary | ICD-10-CM | POA: Diagnosis not present

## 2021-12-22 DIAGNOSIS — R000 Tachycardia, unspecified: Secondary | ICD-10-CM | POA: Diagnosis not present

## 2021-12-22 DIAGNOSIS — I10 Essential (primary) hypertension: Secondary | ICD-10-CM | POA: Diagnosis not present

## 2021-12-29 ENCOUNTER — Other Ambulatory Visit: Payer: Self-pay | Admitting: Cardiology

## 2022-01-16 ENCOUNTER — Ambulatory Visit: Payer: Medicare Other

## 2022-02-13 DIAGNOSIS — I10 Essential (primary) hypertension: Secondary | ICD-10-CM | POA: Diagnosis not present

## 2022-02-13 DIAGNOSIS — R7303 Prediabetes: Secondary | ICD-10-CM | POA: Diagnosis not present

## 2022-02-15 DIAGNOSIS — H20813 Fuchs' heterochromic cyclitis, bilateral: Secondary | ICD-10-CM | POA: Diagnosis not present

## 2022-02-15 DIAGNOSIS — I10 Essential (primary) hypertension: Secondary | ICD-10-CM | POA: Diagnosis not present

## 2022-02-15 DIAGNOSIS — E782 Mixed hyperlipidemia: Secondary | ICD-10-CM | POA: Diagnosis not present

## 2022-02-15 DIAGNOSIS — H9202 Otalgia, left ear: Secondary | ICD-10-CM | POA: Diagnosis not present

## 2022-02-15 DIAGNOSIS — J019 Acute sinusitis, unspecified: Secondary | ICD-10-CM | POA: Diagnosis not present

## 2022-02-15 DIAGNOSIS — R7303 Prediabetes: Secondary | ICD-10-CM | POA: Diagnosis not present

## 2022-02-15 DIAGNOSIS — E876 Hypokalemia: Secondary | ICD-10-CM | POA: Diagnosis not present

## 2022-02-15 DIAGNOSIS — N1832 Chronic kidney disease, stage 3b: Secondary | ICD-10-CM | POA: Diagnosis not present

## 2022-02-15 DIAGNOSIS — R000 Tachycardia, unspecified: Secondary | ICD-10-CM | POA: Diagnosis not present

## 2022-02-15 DIAGNOSIS — K219 Gastro-esophageal reflux disease without esophagitis: Secondary | ICD-10-CM | POA: Diagnosis not present

## 2022-02-15 DIAGNOSIS — J0100 Acute maxillary sinusitis, unspecified: Secondary | ICD-10-CM | POA: Diagnosis not present

## 2022-02-15 DIAGNOSIS — J45909 Unspecified asthma, uncomplicated: Secondary | ICD-10-CM | POA: Diagnosis not present

## 2022-03-06 DIAGNOSIS — I10 Essential (primary) hypertension: Secondary | ICD-10-CM | POA: Diagnosis not present

## 2022-03-06 DIAGNOSIS — K219 Gastro-esophageal reflux disease without esophagitis: Secondary | ICD-10-CM | POA: Diagnosis not present

## 2022-03-08 ENCOUNTER — Encounter (INDEPENDENT_AMBULATORY_CARE_PROVIDER_SITE_OTHER): Payer: Self-pay | Admitting: *Deleted

## 2022-03-15 DIAGNOSIS — I10 Essential (primary) hypertension: Secondary | ICD-10-CM | POA: Diagnosis not present

## 2022-03-15 DIAGNOSIS — K219 Gastro-esophageal reflux disease without esophagitis: Secondary | ICD-10-CM | POA: Diagnosis not present

## 2022-03-23 ENCOUNTER — Emergency Department (HOSPITAL_COMMUNITY): Payer: Medicare Other

## 2022-03-23 ENCOUNTER — Other Ambulatory Visit: Payer: Self-pay

## 2022-03-23 ENCOUNTER — Ambulatory Visit (INDEPENDENT_AMBULATORY_CARE_PROVIDER_SITE_OTHER): Payer: Medicare Other | Admitting: Gastroenterology

## 2022-03-23 ENCOUNTER — Encounter (HOSPITAL_COMMUNITY): Payer: Self-pay | Admitting: Emergency Medicine

## 2022-03-23 ENCOUNTER — Encounter (INDEPENDENT_AMBULATORY_CARE_PROVIDER_SITE_OTHER): Payer: Self-pay | Admitting: Gastroenterology

## 2022-03-23 ENCOUNTER — Emergency Department (HOSPITAL_COMMUNITY)
Admission: EM | Admit: 2022-03-23 | Discharge: 2022-03-23 | Disposition: A | Discharge status: Home / Self Care | Payer: Medicare Other | Attending: Emergency Medicine | Admitting: Emergency Medicine

## 2022-03-23 VITALS — BP 209/72 | HR 98 | Temp 97.9°F | Ht 64.0 in | Wt 104.2 lb

## 2022-03-23 DIAGNOSIS — I10 Essential (primary) hypertension: Secondary | ICD-10-CM | POA: Diagnosis not present

## 2022-03-23 DIAGNOSIS — K222 Esophageal obstruction: Secondary | ICD-10-CM

## 2022-03-23 DIAGNOSIS — R1310 Dysphagia, unspecified: Secondary | ICD-10-CM | POA: Diagnosis not present

## 2022-03-23 DIAGNOSIS — K219 Gastro-esophageal reflux disease without esophagitis: Secondary | ICD-10-CM | POA: Diagnosis not present

## 2022-03-23 DIAGNOSIS — I6782 Cerebral ischemia: Secondary | ICD-10-CM | POA: Diagnosis not present

## 2022-03-23 DIAGNOSIS — Z79899 Other long term (current) drug therapy: Secondary | ICD-10-CM | POA: Insufficient documentation

## 2022-03-23 DIAGNOSIS — R519 Headache, unspecified: Secondary | ICD-10-CM | POA: Diagnosis present

## 2022-03-23 DIAGNOSIS — D320 Benign neoplasm of cerebral meninges: Secondary | ICD-10-CM | POA: Diagnosis not present

## 2022-03-23 DIAGNOSIS — R220 Localized swelling, mass and lump, head: Secondary | ICD-10-CM | POA: Diagnosis not present

## 2022-03-23 LAB — CBC
HCT: 35.3 % — ABNORMAL LOW (ref 36.0–46.0)
Hemoglobin: 11.9 g/dL — ABNORMAL LOW (ref 12.0–15.0)
MCH: 30.6 pg (ref 26.0–34.0)
MCHC: 33.7 g/dL (ref 30.0–36.0)
MCV: 90.7 fL (ref 80.0–100.0)
Platelets: 249 10*3/uL (ref 150–400)
RBC: 3.89 MIL/uL (ref 3.87–5.11)
RDW: 14.3 % (ref 11.5–15.5)
WBC: 5.1 10*3/uL (ref 4.0–10.5)
nRBC: 0 % (ref 0.0–0.2)

## 2022-03-23 LAB — BASIC METABOLIC PANEL
Anion gap: 7 (ref 5–15)
BUN: 16 mg/dL (ref 8–23)
CO2: 24 mmol/L (ref 22–32)
Calcium: 8.7 mg/dL — ABNORMAL LOW (ref 8.9–10.3)
Chloride: 98 mmol/L (ref 98–111)
Creatinine, Ser: 0.98 mg/dL (ref 0.44–1.00)
GFR, Estimated: 57 mL/min — ABNORMAL LOW (ref >60)
Glucose, Bld: 140 mg/dL — ABNORMAL HIGH (ref 70–99)
Potassium: 3.6 mmol/L (ref 3.5–5.1)
Sodium: 129 mmol/L — ABNORMAL LOW (ref 135–145)

## 2022-03-23 MED ORDER — SODIUM CHLORIDE 0.9 % IV BOLUS
1000.0000 mL | Freq: Once | INTRAVENOUS | Status: AC
  Administered 2022-03-23: 1000 mL via INTRAVENOUS

## 2022-03-23 MED ORDER — OMEPRAZOLE 40 MG PO CPDR: 40.0000 mg | DELAYED_RELEASE_CAPSULE | Freq: Every day | ORAL | 1 refills | Status: DC

## 2022-03-23 MED ORDER — HYDROCHLOROTHIAZIDE 12.5 MG PO TABS: 12.5000 mg | ORAL_TABLET | Freq: Every day | ORAL | 0 refills | Status: DC

## 2022-03-23 NOTE — ED Provider Notes (Signed)
[facility] EMERGENCY DEPARTMENT Provider Note   CSN: [id number] Arrival date & time: 03/23/22  1712     History  Chief Complaint  Patient presents with   Hypertension    Stephanie West is a 81 y.o. female who presents to the emergency department for elevated blood pressure.  Patient with history of hypertension currently on medication, was seen earlier today and noted to have an elevated systolic blood pressure into the 200s.  States that she has had a mild headache, no chest pain, blurry vision or difficulty breathing.  She endorses feeling very nervous about her blood pressure.  For the past several weeks she has felt more weak generally, no syncopal episodes.  She does have a history of hyponatremia.   Hypertension Associated symptoms include headaches. Pertinent negatives include no chest pain and no shortness of breath.      Home Medications Prior to Admission medications   Medication Sig Start Date End Date Taking? Authorizing Provider  acetaminophen (TYLENOL) 325 MG tablet Take 2 tablets (650 mg total) by mouth every 6 (six) hours as needed for mild pain (or Fever >/= 101). 02/11/21  Yes Emokpae, Courage, MD  amLODipine (NORVASC) 5 MG tablet Take 1 tablet (5 mg total) by mouth daily. 12/01/21  Yes Tat, Shanon Brow, MD  cloNIDine (CATAPRES - DOSED IN MG/24 HR) 0.2 mg/24hr patch Place 1 patch (0.2 mg total) onto the skin once a week. Every Thursday 02/17/21  Yes Roxan Hockey, MD  ezetimibe (ZETIA) 10 MG tablet Take 10 mg by mouth daily.   Yes [provider]  feeding supplement (ENSURE ENLIVE / ENSURE PLUS) LIQD Take 237 mLs by mouth 3 (three) times daily between meals. 02/11/21  Yes Emokpae, Courage, MD  guanFACINE (INTUNIV) 1 MG TB24 ER tablet Take 1 mg by mouth daily. 04/01/20  Yes [provider]  hydrALAZINE (APRESOLINE) 50 MG tablet Take 1 tablet (50 mg total) by mouth 3 (three) times daily. 12/01/21  Yes Tat, Shanon Brow, MD  hydrochlorothiazide (HYDRODIURIL) 12.5 MG  tablet Take 1 tablet (12.5 mg total) by mouth daily. 03/23/22  Yes Daleen Bo, MD  levocetirizine (XYZAL) 5 MG tablet Take 5 mg by mouth at bedtime. 10/14/21  Yes [provider]  loteprednol (LOTEMAX) 0.5 % ophthalmic suspension 1 drop daily. 03/17/22  Yes [provider]  olmesartan (BENICAR) 40 MG tablet Take 40 mg by mouth daily.   Yes [provider]  polyethylene glycol powder (GLYCOLAX/MIRALAX) 17 GM/SCOOP powder Take 1 Container by mouth as needed for mild constipation.   Yes [provider]  potassium chloride (KLOR-CON) 10 MEQ tablet Take 10 mEq by mouth 3 (three) times daily. 02/27/22  Yes [provider]  sertraline (ZOLOFT) 25 MG tablet Take 25 mg by mouth daily. 03/15/22  Yes [provider]  sucralfate (CARAFATE) 1 g tablet Take 1 g by mouth 4 (four) times daily -  with meals and at bedtime.   Yes [provider]  loteprednol (LOTEMAX) 0.2 % SUSP Place 1 drop into both eyes daily.     [provider]  omeprazole (PRILOSEC) 40 MG capsule Take 1 capsule (40 mg total) by mouth daily. 03/23/22   Carlan, Deatra Robinson, NP      Allergies    Apple juice, Aspirin, Avelox [moxifloxacin hcl in nacl], Bee venom, Benadryl [diphenhydramine hcl], Chicken allergy, Codeine, Factive [gemifloxacin mesylate], Factive [gemifloxacin], Hctz [hydrochlorothiazide], Iodinated contrast media, Iodine, Levaquin [levofloxacin in d5w], Levofloxacin, Penicillin v, Penicillins, Sulfa antibiotics, and Tequin [gatifloxacin]  Review of Systems   Review of Systems  Respiratory:  Negative for shortness of breath.   Cardiovascular:  Negative for chest pain, palpitations and leg swelling.  Neurological:  Positive for headaches.  Psychiatric/Behavioral:  The patient is nervous/anxious.   All other systems reviewed and are negative.  Physical Exam Updated Vital Signs BP (!) 182/66   Pulse 97   Temp 97.8 F (36.6 C) (Oral)   Resp 16   Ht '5\' 3"'$  (1.6  m)   Wt 47.2 kg   SpO2 93%   BMI 18.42 kg/m  Physical Exam Vitals and nursing note reviewed.  Constitutional:      Appearance: Normal appearance.  HENT:     Head: Normocephalic and atraumatic.  Eyes:     Conjunctiva/sclera: Conjunctivae normal.  Cardiovascular:     Rate and Rhythm: Normal rate and regular rhythm.     Pulses:          Radial pulses are 2+ on the right side and 2+ on the left side.       Posterior tibial pulses are 2+ on the right side and 2+ on the left side.  Pulmonary:     Effort: Pulmonary effort is normal. No respiratory distress.     Breath sounds: Normal breath sounds.  Abdominal:     General: There is no distension.     Palpations: Abdomen is soft.     Tenderness: There is no abdominal tenderness.  Skin:    General: Skin is warm and dry.  Neurological:     General: No focal deficit present.     Mental Status: She is alert and oriented to person, place, and time.     Comments: Neuro: Speech is clear, able to follow commands. CN III-XII intact grossly intact. PERRLA. EOMI. Sensation intact throughout. Str 5/5 all extremities.     ED Results / Procedures / Treatments   Labs (all labs ordered are listed, but only abnormal results are displayed) Labs Reviewed  CBC - Abnormal; Notable for the following components:      Result Value   Hemoglobin 11.9 (*)    HCT 35.3 (*)    All other components within normal limits  BASIC METABOLIC PANEL - Abnormal; Notable for the following components:   Sodium 129 (*)    Glucose, Bld 140 (*)    Calcium 8.7 (*)    GFR, Estimated 57 (*)    All other components within normal limits    EKG EKG Interpretation  Date/Time:  Thursday March 23 2022 17:44:28 EDT Ventricular Rate:  105 PR Interval:  136 QRS Duration: 80 QT Interval:  328 QTC Calculation: 433 R Axis:   67 Text Interpretation: Sinus tachycardia Nonspecific ST abnormality Abnormal ECG When compared with ECG of 07-Dec-2021 20:02, PREVIOUS ECG IS PRESENT  since last tracing no significant change Confirmed by Daleen Bo [phone]) on 03/23/2022 6:04:04 PM  Radiology CT Head Wo Contrast  Result Date: 03/23/2022 CLINICAL DATA:  Provided history: Headache, new or worsening. Additional history provided: Headache onset today, elevated blood pressure. EXAM: CT HEAD WITHOUT CONTRAST TECHNIQUE: Contiguous axial images were obtained from the base of the skull through the vertex without intravenous contrast. RADIATION DOSE REDUCTION: This exam was performed according to the departmental dose-optimization program which includes automated exposure control, adjustment of the mA and/or kV according to patient size and/or use of iterative reconstruction technique. COMPARISON:  Head CT 11/29/2021.  Brain MRI 01/17/2021. FINDINGS: Brain: No age advanced or lobar predominant parenchymal atrophy. Redemonstrated  4.3 x 4.4 cm extra-axial dural-based mass (compatible with a meningioma) overlying the mid-to-posterior left frontal lobe. As before, there is mass effect upon the underlying left frontal lobe without appreciable underlying parenchymal edema. 1-2 mm rightward midline shift measured at the left level of the septum pellucidum, also unchanged. Mild patchy and ill-defined hypoattenuation within the cerebral white matter, nonspecific but compatible with chronic small vessel ischemic disease. There is no acute intracranial hemorrhage. No demarcated cortical infarct. No extra-axial fluid collection. Vascular: No hyperdense vessel. Atherosclerotic calcifications. Skull: No fracture or aggressive osseous lesion. Sinuses/Orbits: No mass or acute finding within the imaged orbits. Postsurgical appearance of the paranasal sinuses and nasal passages. As before, there are foci of polypoid soft tissue within the right ethmoidectomy cavity and within the left nasal passage. Mild mucosal thickening within the bilateral maxillary sinuses with associated chronic reactive osteitis. IMPRESSION: No  evidence of acute infarct or acute intracranial hemorrhage. 4.3 x 4.4 cm meningioma overlying the mid-to-posterior left frontal lobe, unchanged from the prior head CT of 11/29/2021. As before, there is mass effect upon the underlying left frontal lobe without appreciable underlying parenchymal edema. 1-2 mm rightward midline shift, also unchanged. Mild chronic small vessel ischemic changes within the cerebral white matter. Sinonasal disease, as described. Electronically Signed   By: Kellie Simmering D.O.   On: 03/23/2022 19:31    Procedures Procedures    Medications Ordered in ED Medications  sodium chloride 0.9 % bolus 1,000 mL (0 mLs Intravenous Stopped 03/23/22 2151)    ED Course/ Medical Decision Making/ A&P                           Medical Decision Making Amount and/or Complexity of Data Reviewed Labs: ordered. Radiology: ordered.  This patient is a 81 y.o. female  who presents to the ED for concern of elevated blood pressure and headache.   Differential diagnoses prior to evaluation: The emergent differential diagnosis includes, but is not limited to, hypertensive urgency/emergency, primary hypertension, intracranial hemorrhage, stroke. This is not an exhaustive differential.   Past Medical History / Co-morbidities: Hypertension, asthma, GERD, mitral valve prolapse, hyponatremia  Additional history: Chart reviewed. Pertinent results include: She was hospitalized for hyponatremia in April 2022, lowest reported sodium level 117.  She is followed up with Dr. Harl Bowie with cardiology, as well as also going to hypertension clinic.  She states that the hypertension clinic had put her on carvedilol, and that resulted in her having symptomatic bradycardia into the 30s, also requiring admission.  Physical Exam: Physical exam performed. The pertinent findings include: Patient persistently hypertensive.  Complaining of a mild headache.  No neurologic deficits.  Lab Tests/Imaging studies: I  Ordered, and personally interpreted labs/imaging and the pertinent results include: No leukocytosis, hemoglobin stable.  Hyponatremia of 129.  CT scan of the head shows stable meningioma, no acute intracranial abnormality.  I agree with the radiologist interpretation.  Cardiac monitoring: EKG performed and was interpreted by attending physician Dr. Eulis Foster and myself with pertinent findings: Sinus tachycardia with rate of 105.   Medications: I ordered medication including IV fluids for hyponatremia.  I have reviewed the patients home medicines and have made adjustments as needed.   Disposition: After consideration of the diagnostic results and the patients response to treatment, I feel that patient's not requiring admission.  Due to her persistent hypertension, will go ahead and discharge her with a prescription for hydrochlorothiazide, which she has been on in the past.  I have sent a message to patient's cardiologist, requesting a sooner appointment.  Patient also understands that she should contact her cardiologist as well to request the same discussed reasons to return to the emergency department, and the patient is agreeable to the plan.   I discussed this case with my attending physician Dr. Eulis Foster who cosigned this note including patient's presenting symptoms, physical exam, and planned diagnostics and interventions. Attending physician stated agreement with plan or made changes to plan which were implemented.    Final Clinical Impression(s) / ED Diagnoses Final diagnoses:  Hypertension, unspecified type    Rx / DC Orders ED Discharge Orders          Ordered    hydrochlorothiazide (HYDRODIURIL) 12.5 MG tablet  Daily        03/23/22 2153           Portions of this report may have been transcribed using voice recognition software. Every effort was made to ensure accuracy; however, inadvertent computerized transcription errors may be present.    Estill Cotta 03/23/22  2201    Daleen Bo, MD 03/24/22 [phone]

## 2022-03-23 NOTE — Patient Instructions (Signed)
Please stop nexium. I have sent omeprazole '40mg'$  to your pharmacy Please take this 30 minutes prior to breakfast Avoid greasy, spicy, fried, citrus foods, and be mindful that caffeine, carbonated drinks, chocolate and alcohol can increase reflux symptoms Stay upright 2-3 hours after eating, prior to lying down and avoid eating late in the evenings. Finish the carafate that was provided to you by your pcp If symptoms worsen or you develop any new GI symptoms, please make me aware as we may need to further discuss proceeding with endoscopic evalation of your symptoms  Follow up 1 month

## 2022-03-23 NOTE — ED Notes (Signed)
Patient transported to CT 

## 2022-03-23 NOTE — Discharge Instructions (Addendum)
You were seen the emergency department today for elevated blood pressure.  As we discussed your lab work and image of your head CT looked reassuring.  Your sodium was slightly low at 129, and we gave you some fluids to hopefully replace this.  We wrote a prescription for a medicine called hydrochlorothiazide that you have previously been on.  It appears that you probably need another blood pressure medicine.  This may end up causing problems with the sodium.  We would like you to discuss treatment with this medication or another type of blood pressure medicine, with your doctor tomorrow.  He may prefer to have you see a cardiologist for evaluation and treatment of the blood pressure as well.  Today the CAT scan did not show stroke and there are no other worrisome findings today.

## 2022-03-23 NOTE — ED Triage Notes (Signed)
Pt presents with elevated B/P in  200s, sent from Dr. Nevada Crane office for evaluation, currently on BP meds.

## 2022-03-23 NOTE — Progress Notes (Signed)
Referring Provider: Celene Squibb, MD Primary Care Physician:  Celene Squibb, MD Primary GI Physician: new  Chief Complaint  Patient presents with   Gastroesophageal Reflux    Having a sore throat and trouble swallowing, heartburn and reflux.     HPI:   Stephanie West is a 81 y.o. female with past medical history of GERD, asthma, hypertension,  Patient presenting today for sore throat, dysphagia and GERD.  History: Last seen 06/16/21, at that time she reported she had been taking nexium 1.5 hours prior to breakfast and felt that symptoms were well controlled. She denied dysphagia or odynophagia at that time. Having a BM every 3 days though with significant straining. Denied any other symptoms   Present: Still maintained on Nexium '40mg'$  daily, Patient saw pcp recently with c/o feeling that food was getting stuck with swallowing and more burning in her throat to her stomach, despite taking esomeprazole daily. She was started on carafate 1g QID by PCP.   States that about 3 weeks ago she began having burning in her throat, down to her stomach as well as soreness in her throat. She states that her tongue was burning as well. PCP started her on carafate and symptoms seemed to have improved significantly. Throat is no longer sore, but still feels mildly irritated. She is continued on this. She is no longer experiencing dysphagia, however she does endorse continued burning in her esophagus and throat though much less severe. She does take hydralazine, amlodipine, potassium, cetirizine, guaifenasin, QHS, however she does wait usually a couple of hours prior to lying down for bed. She denies any recent changes in her medications. States she was previously on omeprazole with good result but switched to nexium years ago when omeprazole stopped working. She does report approx 6 pounds of weight loss since December, she is noting that she has to force herself to eat as appetite does not feel that great.  She denies any post prandial abdominal pain or bloating. Denies rectal bleeding or melena. Does note ongoing constipation, sometimes uses a rectal suppository to help her go, typically will have a BM every other day. Denies significant straining to defecate.   Last EGD: 2016 - High-grade Schatzki's ring which was disrupted with combination of scope balloon dilation and focal biopsy. Moderate-sized sliding hiatal hernia. Nonerosive antral gastritis. Biopsy taken. Last Colonoscopy: believes 5 years ago but no reports available, not interested in pursuing any more colonoscopies unless strictly necessary    Past Medical History:  Diagnosis Date   Asthma    Dysphagia 05/10/2017   Elevated transaminase level    GERD (gastroesophageal reflux disease)    Hypertension    MVP (mitral valve prolapse)     Past Surgical History:  Procedure Laterality Date   COLONOSCOPY     ESOPHAGEAL DILATION  07/30/2015   Procedure: ESOPHAGEAL DILATION;  Surgeon: Rogene Houston, MD;  [facility];  Service: Endoscopy;;   ESOPHAGOGASTRODUODENOSCOPY N/A 04/15/2015   Procedure: ESOPHAGOGASTRODUODENOSCOPY (EGD);  Surgeon: Rogene Houston, MD;  [facility];  Service: Endoscopy;  Laterality: N/A;  125 - moved to 1:00, Ann to notify pt   ESOPHAGOGASTRODUODENOSCOPY N/A 07/30/2015   Procedure: ESOPHAGOGASTRODUODENOSCOPY (EGD);  Surgeon: Rogene Houston, MD;  [facility];  Service: Endoscopy;  Laterality: N/A;  1040   ESOPHAGOGASTRODUODENOSCOPY (EGD) WITH ESOPHAGEAL DILATION N/A 03/07/2013   Procedure: ESOPHAGOGASTRODUODENOSCOPY (EGD) WITH ESOPHAGEAL DILATION;  Surgeon: Rogene Houston, MD;  [facility];  Service: Endoscopy;  Laterality: N/A;  830   EYE SURGERY     LAPAROSCOPIC TUBAL LIGATION  1984   UPPER GASTROINTESTINAL ENDOSCOPY      Current Outpatient Medications  Medication Sig Dispense Refill   acetaminophen (TYLENOL) 325 MG tablet Take 2 tablets (650 mg total) by mouth  every 6 (six) hours as needed for mild pain (or Fever >/= 101). 12 tablet 0   amLODipine (NORVASC) 5 MG tablet Take 1 tablet (5 mg total) by mouth daily. 30 tablet 1   cloNIDine (CATAPRES - DOSED IN MG/24 HR) 0.2 mg/24hr patch Place 1 patch (0.2 mg total) onto the skin once a week. Every Thursday 4 patch 12   esomeprazole (NEXIUM) 40 MG capsule Take 1 capsule (40 mg total) by mouth daily before breakfast. 90 capsule 3   ezetimibe (ZETIA) 10 MG tablet Take 10 mg by mouth daily.     feeding supplement (ENSURE ENLIVE / ENSURE PLUS) LIQD Take 237 mLs by mouth 3 (three) times daily between meals. 237 mL 12   guanFACINE (INTUNIV) 1 MG TB24 ER tablet Take 1 mg by mouth daily.     hydrALAZINE (APRESOLINE) 50 MG tablet Take 1 tablet (50 mg total) by mouth 3 (three) times daily. 90 tablet 1   levocetirizine (XYZAL) 5 MG tablet Take 5 mg by mouth at bedtime.     loteprednol (LOTEMAX) 0.2 % SUSP Place 1 drop into both eyes daily.      Netarsudil Dimesylate 0.02 % SOLN Place 1 drop into both eyes every evening.     olmesartan (BENICAR) 40 MG tablet Take 40 mg by mouth daily.     polyethylene glycol powder (GLYCOLAX/MIRALAX) 17 GM/SCOOP powder Take 1 Container by mouth as needed for mild constipation.     potassium citrate (UROCIT-K) 10 MEQ (1080 MG) SR tablet Take 10 mEq by mouth 3 (three) times daily with meals.     sucralfate (CARAFATE) 1 g tablet Take 1 g by mouth 4 (four) times daily -  with meals and at bedtime.     No current facility-administered medications for this visit.    Allergies as of 03/23/2022 - Review Complete 03/23/2022  Allergen Reaction Noted   Apple juice Other (See Comments) 07/03/2015   Aspirin Other (See Comments) 01/10/2007   Avelox [moxifloxacin hcl in nacl] Hives 02/19/2013   Bee venom Other (See Comments) 10/25/2013   Benadryl [diphenhydramine hcl] Other (See Comments) 03/14/2011   Chicken allergy Other (See Comments) 10/25/2013   Codeine Other (See Comments) 10/25/2013    Factive [gemifloxacin mesylate] Hives 03/14/2011   Factive [gemifloxacin] Hives 02/19/2013   Hctz [hydrochlorothiazide]  07/16/2020   Iodinated contrast media Other (See Comments) 06/17/2015   Iodine  02/07/2016   Levaquin [levofloxacin in d5w] Hives 02/19/2013   Levofloxacin Hives 03/14/2011   Penicillin v  01/10/2007   Penicillins Other (See Comments) 03/14/2011   Sulfa antibiotics Nausea Only 01/10/2007   Tequin [gatifloxacin] Hives 02/19/2013    Family History  Problem Relation Age of Onset   Hypertension Mother    Prostate cancer Father     Social History   Socioeconomic History   Marital status: Widowed    Spouse name: Not on file   Number of children: Not on file   Years of education: Not on file   Highest education level: Not on file  Occupational History   Not on file  Tobacco Use   Smoking status: Never    Passive exposure: Never   Smokeless tobacco: Never  Vaping Use   Vaping Use: Never used  Substance and Sexual Activity   Alcohol use: No    Alcohol/week: 0.0 standard drinks   Drug use: No   Sexual activity: Not on file  Other Topics Concern   Not on file  Social History Narrative   Not on file   Social Determinants of Health   Financial Resource Strain: Not on file  Food Insecurity: Not on file  Transportation Needs: Not on file  Physical Activity: Not on file  Stress: Not on file  Social Connections: Not on file   Review of systems General: negative for malaise, night sweats, fever, chills  Neck: Negative for lumps, goiter, pain and significant neck swelling Resp: Negative for cough, wheezing, dyspnea at rest CV: Negative for chest pain, leg swelling, palpitations, orthopnea GI: denies melena, hematochezia, nausea, vomiting, diarrhea, constipation, dysphagia, odyonophagia, early satiety +mild amount of weight loss +burning to throat and esophagus  MSK: Negative for joint pain or swelling, back pain, and muscle pain. Derm: Negative for itching  or rash Psych: Denies depression, anxiety, memory loss, confusion. No homicidal or suicidal ideation.  Heme: Negative for prolonged bleeding, bruising easily, and swollen nodes. Endocrine: Negative for cold or heat intolerance, polyuria, polydipsia and goiter. Neuro: negative for tremor, gait imbalance, syncope and seizures. The remainder of the review of systems is noncontributory.  Physical Exam: BP (!) 209/72 (BP Location: Left Arm, Patient Position: Sitting, Cuff Size: Small)   Pulse 98   Temp 97.9 F (36.6 C) (Oral)   Ht '5\' 4"'$  (1.626 m)   Wt 104 lb 3.2 oz (47.3 kg)   BMI 17.89 kg/m  General:   Alert and oriented. No distress noted. Pleasant and cooperative.  Head:  Normocephalic and atraumatic. Eyes:  Conjuctiva clear without scleral icterus. Mouth:  Oral mucosa pink and moist. Good dentition. No lesions. Heart: Normal rate and rhythm, s1 and s2 heart sounds present.  Lungs: Clear lung sounds in all lobes. Respirations equal and unlabored. Abdomen:  +BS, soft, non-tender and non-distended. No rebound or guarding. No HSM or masses noted. Derm: No palmar erythema or jaundice Msk:  Symmetrical without gross deformities. Normal posture. Extremities:  Without edema. Neurologic:  Alert and  oriented x4 Psych:  Alert and cooperative. Normal mood and affect.  Invalid input(s): 6 MONTHS   ASSESSMENT: PERRI ARAGONES is a 81 y.o. female presenting today for GERD symptoms.  Acute onset of burning to throat, esophagus and mouth about 3 weeks ago, no obvious precipitating factors. Started on carafate 1g which she is taking TID and symptoms have improved significantly. She is no longer having dysphagia, continues with mild burning to UGI tract. Denies abdominal pain, bloating, early satiety. I discussed proceeding with EGD for further evaluation of symptoms as we cannot rule out esophagitis vs just worsening of her GERD symptoms, however, at this time patient does not want to proceed with  endoscopic evaluation as she was told previously she would have to go to [facility] for any further endoscopies? We will stop nexium and start her on omeprazole '40mg'$  once daily. She should complete course of carafate 1g that was provided to her by PCP. Discussed importance of reflux precautions to include Avoiding greasy, spicy, fried, citrus foods, and be mindful that caffeine, carbonated drinks, chocolate and alcohol can increase reflux symptoms, she should Stay upright 2-3 hours after eating, prior to lying down and avoid eating late in the evenings.if she develops worsening symptoms, recurrence of dysphagia or any alarm symptoms,  we will need to have further discussion regarding need for endoscopic evaluation.    PLAN:  Stop nexium, start omeprazole '40mg'$  daily 2.  Reflux precautions 3. Continue carafate 1g to complete course given by PCP 4. discuss EGD further if symptoms not improved  All questions were answered, patient verbalized understanding and is in agreement with plan as outlined above.   Follow Up: 1 month  Diani Jillson L. Alver Sorrow, MSN, APRN, AGNP-C Adult-Gerontology Nurse Practitioner [facility]

## 2022-03-23 NOTE — ED Provider Notes (Signed)
  Face-to-face evaluation   History: Patient presents for evaluation of high blood pressure.  This was incidental finding when she saw her GI doctor today.  She briefly went to her PCP office who checked her blood pressure, then sent her here for evaluation. She is on chronic antihypertensive treatment  Physical exam: Alert elderly female.  She is comfortable.  No dysarthria or aphasia.  She is lucid.  MDM: Evaluation for  Chief Complaint  Patient presents with   Hypertension     Patient here with headache and high blood pressure.  She has had difficult to treat hypertension in the past and is currently on multiple agents.  Sodium is slightly low and she was treated with IV fluids.  Discussed difficult situation with patient and daughter.  I wrote a prescription for hydrochlorothiazide.  Plan is to have her run this by her PCP.  She has previously had some difficulty with hydrochlorothiazide in the past however this might be something at least in the short-term to improve her condition.  She does not appear to have hypertensive urgency, or other unstable hemodynamic parameters.  She can be managed as an outpatient and does not require hospitalization.  Medical screening examination/treatment/procedure(s) were conducted as a shared visit with non-physician practitioner(s) and myself.  I personally evaluated the patient during the encounter    Daleen Bo, MD 03/24/22 [phone]

## 2022-03-24 ENCOUNTER — Telehealth: Payer: Self-pay | Admitting: Cardiology

## 2022-03-24 MED ORDER — HYDRALAZINE HCL 50 MG PO TABS: 75.0000 mg | ORAL_TABLET | Freq: Three times a day (TID) | ORAL | 3 refills | Status: DC

## 2022-03-24 NOTE — Telephone Encounter (Signed)
Secure chat by Dr.Branch: "problems with low sodium in the past on HCTZ, would not restart. Would clarify she is taking hydralazine '50mg'$  tid and if so increase to '75mg'$  tid"   I spoke with daughter, [name], and they will stop HCTZ and increase hydralazine to 75 mg TID. Daughter will call back in a week with BP updates.     Dr.hall's office was closed intil 2 pm for lunch,unable to reach

## 2022-03-24 NOTE — Telephone Encounter (Signed)
I have nurse from Dr. Josue Hector office on the phone. Patient was in the ED yesterday for high BP. she was prescribed Hyrdocholorothiazide. They want to know how Dr. Harl Bowie feels about patient going back on hydrochlorothiazide. Daughter is there at the office. They want to know before she goes to pick up the medication.   Please call daughter.

## 2022-04-11 DIAGNOSIS — R519 Headache, unspecified: Secondary | ICD-10-CM | POA: Diagnosis not present

## 2022-04-11 DIAGNOSIS — R42 Dizziness and giddiness: Secondary | ICD-10-CM | POA: Diagnosis not present

## 2022-04-11 DIAGNOSIS — G4710 Hypersomnia, unspecified: Secondary | ICD-10-CM | POA: Diagnosis not present

## 2022-04-11 DIAGNOSIS — F331 Major depressive disorder, recurrent, moderate: Secondary | ICD-10-CM | POA: Diagnosis not present

## 2022-04-11 DIAGNOSIS — R112 Nausea with vomiting, unspecified: Secondary | ICD-10-CM | POA: Diagnosis not present

## 2022-04-11 DIAGNOSIS — G4700 Insomnia, unspecified: Secondary | ICD-10-CM | POA: Diagnosis not present

## 2022-04-11 DIAGNOSIS — R634 Abnormal weight loss: Secondary | ICD-10-CM | POA: Diagnosis not present

## 2022-04-11 DIAGNOSIS — R5383 Other fatigue: Secondary | ICD-10-CM | POA: Diagnosis not present

## 2022-04-11 DIAGNOSIS — R000 Tachycardia, unspecified: Secondary | ICD-10-CM | POA: Diagnosis not present

## 2022-04-11 DIAGNOSIS — G3184 Mild cognitive impairment, so stated: Secondary | ICD-10-CM | POA: Diagnosis not present

## 2022-04-11 DIAGNOSIS — I10 Essential (primary) hypertension: Secondary | ICD-10-CM | POA: Diagnosis not present

## 2022-04-11 DIAGNOSIS — M810 Age-related osteoporosis without current pathological fracture: Secondary | ICD-10-CM | POA: Diagnosis not present

## 2022-04-20 ENCOUNTER — Encounter: Payer: Self-pay | Admitting: Cardiology

## 2022-04-20 ENCOUNTER — Ambulatory Visit (INDEPENDENT_AMBULATORY_CARE_PROVIDER_SITE_OTHER): Payer: Medicare Other | Admitting: Cardiology

## 2022-04-20 ENCOUNTER — Other Ambulatory Visit (INDEPENDENT_AMBULATORY_CARE_PROVIDER_SITE_OTHER): Payer: Self-pay | Admitting: Gastroenterology

## 2022-04-20 ENCOUNTER — Other Ambulatory Visit (INDEPENDENT_AMBULATORY_CARE_PROVIDER_SITE_OTHER): Payer: Self-pay

## 2022-04-20 VITALS — BP 166/70 | HR 102 | Ht 63.0 in | Wt 96.8 lb

## 2022-04-20 DIAGNOSIS — I10 Essential (primary) hypertension: Secondary | ICD-10-CM

## 2022-04-20 NOTE — Patient Instructions (Signed)
Medication Instructions:  Your physician recommends that you continue on your current medications as directed. Please refer to the Current Medication list given to you today.   Labwork: None  Testing/Procedures: None  Follow-Up: Follow up in 6 months with Dr. Branch  Any Other Special Instructions Will Be Listed Below (If Applicable).     If you need a refill on your cardiac medications before your next appointment, please call your pharmacy.  

## 2022-04-20 NOTE — Progress Notes (Signed)
Clinical Summary Ms. Poche is a 81 y.o.female seen today for follow up of the following medical problems.    1. HTN - white coat HTN, bp's in clinic not reflective of her true bp's.  - difficult to manage bp due to side effects - hyponatremia on HCTZ. Prior episode, recently retreid on HCTZ with recurrent hyponatremia. Resolved with stopping - LE edema on norvasc 10, lowered to 5 and had done well but recentlty with LE edema - has historically been on clonidine and guanfacine, both alpha 2 agonists. SIgnifiacnt fatigue last visit, we weaned clonicine to 0.'1mg'$  tid with improved symptoms.  - has been on dilt 360 and olmesartan without side effects.    Prior visit - home bp's 118-150/58-78 - fatigue is improving with weanign of clonidine - significant LE edema on norvasc 5 and with stopping HCTZ recently for hyponatremia.          -home bp's 140-150/50-80s - history of white coat HTN - no recent leg edema.       2. Palpitations - 7 day event monitor showed SR and sinus tach - palpitations in early AM around 4 to 5 AM, corresponds on monitor to mild sinus tach - stakes her long acting dilt at night time.      - denies any recent issues       3.Hyponatremia  - admit 01/2021, thought secondary to poor oral intake.  - has had prior issues with low sodiums in the past, at one point secondary to diuretics.  Past Medical History:  Diagnosis Date   Asthma    Dysphagia 05/10/2017   Elevated transaminase level    GERD (gastroesophageal reflux disease)    Hypertension    MVP (mitral valve prolapse)      Allergies  Allergen Reactions   Apple Juice Other (See Comments)    Wheezing and Asthma Symptoms    Aspirin Other (See Comments)    Wheezing, Asthma Symptoms    Avelox [Moxifloxacin Hcl In Nacl] Hives    Pt can take cipro   Bee Venom Other (See Comments)    Wheezing, Asthma Symptoms   Benadryl [Diphenhydramine Hcl] Other (See Comments)    Wheezing, Asthma  Symptoms    Chicken Allergy Other (See Comments)    Wheezing and Asthma Symptoms    Codeine Other (See Comments)    Makes patient feel like she's going to pass out.    Factive [Gemifloxacin Mesylate] Hives   Factive [Gemifloxacin] Hives   Hctz [Hydrochlorothiazide]     hyponatremia   Iodinated Contrast Media Other (See Comments)    Pt was told by allergist to avoid  Dye due to other allergies   Iodine    Levaquin [Levofloxacin In D5w] Hives    Pt can take cipro   Levofloxacin Hives   Penicillin V    Penicillins Other (See Comments)    Asthma Symptoms .Did it involve swelling of the face/tongue/throat, SOB, or low BP? No Did it involve sudden or severe rash/hives, skin peeling, or any reaction on the inside of your mouth or nose? Yes Did you need to seek medical attention at a hospital or doctor's office? Unknown When did it last happen?      Unkown If all above answers are "NO", may proceed with cephalosporin use.    Sulfa Antibiotics Nausea Only   Tequin [Gatifloxacin] Hives     Current Outpatient Medications  Medication Sig Dispense Refill   acetaminophen (TYLENOL) 325 MG tablet Take 2  tablets (650 mg total) by mouth every 6 (six) hours as needed for mild pain (or Fever >/= 101). 12 tablet 0   amLODipine (NORVASC) 5 MG tablet Take 1 tablet (5 mg total) by mouth daily. 30 tablet 1   cloNIDine (CATAPRES - DOSED IN MG/24 HR) 0.2 mg/24hr patch Place 1 patch (0.2 mg total) onto the skin once a week. Every Thursday 4 patch 12   ezetimibe (ZETIA) 10 MG tablet Take 10 mg by mouth daily.     feeding supplement (ENSURE ENLIVE / ENSURE PLUS) LIQD Take 237 mLs by mouth 3 (three) times daily between meals. 237 mL 12   guanFACINE (INTUNIV) 1 MG TB24 ER tablet Take 1 mg by mouth daily.     hydrALAZINE (APRESOLINE) 50 MG tablet Take 1.5 tablets (75 mg total) by mouth 3 (three) times daily. 405 tablet 3   levocetirizine (XYZAL) 5 MG tablet Take 5 mg by mouth at bedtime.     loteprednol  (LOTEMAX) 0.2 % SUSP Place 1 drop into both eyes daily.      loteprednol (LOTEMAX) 0.5 % ophthalmic suspension 1 drop daily.     olmesartan (BENICAR) 40 MG tablet Take 40 mg by mouth daily.     omeprazole (PRILOSEC) 40 MG capsule Take 1 capsule (40 mg total) by mouth daily. 30 capsule 1   polyethylene glycol powder (GLYCOLAX/MIRALAX) 17 GM/SCOOP powder Take 1 Container by mouth as needed for mild constipation.     potassium chloride (KLOR-CON) 10 MEQ tablet Take 10 mEq by mouth 3 (three) times daily.     sertraline (ZOLOFT) 25 MG tablet Take 25 mg by mouth daily.     sucralfate (CARAFATE) 1 g tablet Take 1 g by mouth 4 (four) times daily -  with meals and at bedtime.     No current facility-administered medications for this visit.     Past Surgical History:  Procedure Laterality Date   COLONOSCOPY     ESOPHAGEAL DILATION  07/30/2015   Procedure: ESOPHAGEAL DILATION;  Surgeon: Rogene Houston, MD;  [facility];  Service: Endoscopy;;   ESOPHAGOGASTRODUODENOSCOPY N/A 04/15/2015   Procedure: ESOPHAGOGASTRODUODENOSCOPY (EGD);  Surgeon: Rogene Houston, MD;  [facility];  Service: Endoscopy;  Laterality: N/A;  125 - moved to 1:00, Ann to notify pt   ESOPHAGOGASTRODUODENOSCOPY N/A 07/30/2015   Procedure: ESOPHAGOGASTRODUODENOSCOPY (EGD);  Surgeon: Rogene Houston, MD;  [facility];  Service: Endoscopy;  Laterality: N/A;  1040   ESOPHAGOGASTRODUODENOSCOPY (EGD) WITH ESOPHAGEAL DILATION N/A 03/07/2013   Procedure: ESOPHAGOGASTRODUODENOSCOPY (EGD) WITH ESOPHAGEAL DILATION;  Surgeon: Rogene Houston, MD;  [facility];  Service: Endoscopy;  Laterality: N/A;  830   EYE SURGERY     LAPAROSCOPIC TUBAL LIGATION  1984   UPPER GASTROINTESTINAL ENDOSCOPY       Allergies  Allergen Reactions   Apple Juice Other (See Comments)    Wheezing and Asthma Symptoms    Aspirin Other (See Comments)    Wheezing, Asthma Symptoms    Avelox [Moxifloxacin Hcl In Nacl]  Hives    Pt can take cipro   Bee Venom Other (See Comments)    Wheezing, Asthma Symptoms   Benadryl [Diphenhydramine Hcl] Other (See Comments)    Wheezing, Asthma Symptoms    Chicken Allergy Other (See Comments)    Wheezing and Asthma Symptoms    Codeine Other (See Comments)    Makes patient feel like she's going to pass out.    Factive [Gemifloxacin Mesylate]  Hives   Factive [Gemifloxacin] Hives   Hctz [Hydrochlorothiazide]     hyponatremia   Iodinated Contrast Media Other (See Comments)    Pt was told by allergist to avoid  Dye due to other allergies   Iodine    Levaquin [Levofloxacin In D5w] Hives    Pt can take cipro   Levofloxacin Hives   Penicillin V    Penicillins Other (See Comments)    Asthma Symptoms .Did it involve swelling of the face/tongue/throat, SOB, or low BP? No Did it involve sudden or severe rash/hives, skin peeling, or any reaction on the inside of your mouth or nose? Yes Did you need to seek medical attention at a hospital or doctor's office? Unknown When did it last happen?      Unkown If all above answers are "NO", may proceed with cephalosporin use.    Sulfa Antibiotics Nausea Only   Tequin [Gatifloxacin] Hives      Family History  Problem Relation Age of Onset   Hypertension Mother    Prostate cancer Father      Social History Ms. Tash reports that she has never smoked. She has never been exposed to tobacco smoke. She has never used smokeless tobacco. Ms. Strehl reports no history of alcohol use.   Review of Systems CONSTITUTIONAL: No weight loss, fever, chills, weakness or fatigue.  HEENT: Eyes: No visual loss, blurred vision, double vision or yellow sclerae.No hearing loss, sneezing, congestion, runny nose or sore throat.  SKIN: No rash or itching.  CARDIOVASCULAR: no chest pain, no palpitations.  RESPIRATORY: No shortness of breath, cough or sputum.  GASTROINTESTINAL: No anorexia, nausea, vomiting or diarrhea. No abdominal pain  or blood.  GENITOURINARY: No burning on urination, no polyuria NEUROLOGICAL: No headache, dizziness, syncope, paralysis, ataxia, numbness or tingling in the extremities. No change in bowel or bladder control.  MUSCULOSKELETAL: No muscle, back pain, joint pain or stiffness.  LYMPHATICS: No enlarged nodes. No history of splenectomy.  PSYCHIATRIC: No history of depression or anxiety.  ENDOCRINOLOGIC: No reports of sweating, cold or heat intolerance. No polyuria or polydipsia.  Marland Kitchen   Physical Examination Today's Vitals   04/20/22 0923  BP: (!) 166/70  Pulse: (!) 102  SpO2: 97%  Weight: 96 lb 12.8 oz (43.9 kg)  Height: '5\' 3"'$  (1.6 m)   Body mass index is 17.15 kg/m.  Gen: resting comfortably, no acute distress HEENT: no scleral icterus, pupils equal round and reactive, no palptable cervical adenopathy,  CV: RRR, no mr/g no jvd Resp: Clear to auscultation bilaterally GI: abdomen is soft, non-tender, non-distended, normal bowel sounds, no hepatosplenomegaly MSK: extremities are warm, no edema.  Skin: warm, no rash Neuro:  no focal deficits Psych: appropriate affect   Diagnostic Studies 05/2020 event monitor 7 day event monitor Min HR 49, Avg HR 65, Max HR 119 Reported symptoms correlate with sinus rhythm and sinus tachycardia No significant arrhythmias    Assessment and Plan   1. HTN - complex history as reproted above, multi drug regimen with prior side effects - history of white coat HTN, office numbers not reflective of overall control - elevated here, home numbers 140s-150s/50s-80s. Reasonable for her given advanced age and multiple med side effects. Room to titrate hydral further if needed.      Arnoldo Lenis, M.D.

## 2022-04-27 ENCOUNTER — Encounter (INDEPENDENT_AMBULATORY_CARE_PROVIDER_SITE_OTHER): Payer: Self-pay | Admitting: Gastroenterology

## 2022-04-27 ENCOUNTER — Ambulatory Visit (INDEPENDENT_AMBULATORY_CARE_PROVIDER_SITE_OTHER): Payer: Medicare Other | Admitting: Gastroenterology

## 2022-04-27 VITALS — BP 149/73 | HR 125 | Temp 98.4°F | Ht 63.0 in | Wt 96.8 lb

## 2022-04-27 DIAGNOSIS — R634 Abnormal weight loss: Secondary | ICD-10-CM | POA: Diagnosis not present

## 2022-04-27 DIAGNOSIS — K219 Gastro-esophageal reflux disease without esophagitis: Secondary | ICD-10-CM | POA: Diagnosis not present

## 2022-04-27 NOTE — Progress Notes (Signed)
Referring Provider: Celene Squibb, MD Primary Care Physician:  Celene Squibb, MD Primary GI Physician:   Chief Complaint  Patient presents with   Gastroesophageal Reflux    1 month follow up on GERD. States much better. Seeing pcp for weight loss. Started magace bid in May. States she eats very little but does do 3 meals a day. Takes one meal replacement shake about every other day.    HPI:   Stephanie West is a 81 y.o. female with past medical history of GERD, asthma, HTN.  Patient presenting today for f/u of GERD.  Last seen March 23 2022 At that time, started on omeprazole '40mg'$  daily from nexium, discussed reflux precautions, continued on carafate 1g to complete course given by PCP, discussed EGD if symptosm not improved at follow up visit.   Doing much better today. Denies GERD symptoms, feels that swallowing is improved, she has no issues with dysphagia. Feels that omeprazole really improved her symptoms. Seen by PCP started megance in May due to weight loss. She is down 8 pounds in the last month. eating 2 small meals per day with an egg/toast for breakfast and then states she has a snack type lunch and then has a sandwich or hamburger for dinner. She is doing protein shakes maybe one every other day. Her daughter reports that patient's spouse passed away 3 years ago and she really began not eating then. She states she has seen an improvement in the patient's appetite over past week. Has follow up with PCP for weight check later this month.   No changes in bowel habits, no constipation diarrhea, rectal bleeding or melena. She has no abdominal pain, no nausea or vomiting.   Notably, last imaging CT A/C/P wo contrast 01/2021, with no acute abnormalities.   Last EGD: 2016 - High-grade Schatzki's ring which was disrupted with combination of scope balloon dilation and focal biopsy. Moderate-sized sliding hiatal hernia. Nonerosive antral gastritis. Biopsy taken-negative Last Colonoscopy:  believes 5 years ago but no reports available, not interested in pursuing any more colonoscopies unless strictly necessary  Past Medical History:  Diagnosis Date   Asthma    Dysphagia 05/10/2017   Elevated transaminase level    GERD (gastroesophageal reflux disease)    Hypertension    MVP (mitral valve prolapse)     Past Surgical History:  Procedure Laterality Date   COLONOSCOPY     ESOPHAGEAL DILATION  07/30/2015   Procedure: ESOPHAGEAL DILATION;  Surgeon: Rogene Houston, MD;  Location: [facility];  Service: Endoscopy;;   ESOPHAGOGASTRODUODENOSCOPY N/A 04/15/2015   Procedure: ESOPHAGOGASTRODUODENOSCOPY (EGD);  Surgeon: Rogene Houston, MD;  Location: [facility];  Service: Endoscopy;  Laterality: N/A;  125 - moved to 1:00, Ann to notify pt   ESOPHAGOGASTRODUODENOSCOPY N/A 07/30/2015   Procedure: ESOPHAGOGASTRODUODENOSCOPY (EGD);  Surgeon: Rogene Houston, MD;  Location: [facility];  Service: Endoscopy;  Laterality: N/A;  1040   ESOPHAGOGASTRODUODENOSCOPY (EGD) WITH ESOPHAGEAL DILATION N/A 03/07/2013   Procedure: ESOPHAGOGASTRODUODENOSCOPY (EGD) WITH ESOPHAGEAL DILATION;  Surgeon: Rogene Houston, MD;  Location: [facility];  Service: Endoscopy;  Laterality: N/A;  830   EYE SURGERY     LAPAROSCOPIC TUBAL LIGATION  1984   UPPER GASTROINTESTINAL ENDOSCOPY      Current Outpatient Medications  Medication Sig Dispense Refill   acetaminophen (TYLENOL) 325 MG tablet Take 2 tablets (650 mg total) by mouth every 6 (six) hours as needed for mild pain (or Fever >/= 101). 12  tablet 0   amLODipine (NORVASC) 5 MG tablet Take 1 tablet (5 mg total) by mouth daily. 30 tablet 1   cloNIDine (CATAPRES - DOSED IN MG/24 HR) 0.2 mg/24hr patch Place 1 patch (0.2 mg total) onto the skin once a week. Every Thursday 4 patch 12   donepezil (ARICEPT) 5 MG tablet Take 5 mg by mouth at bedtime.     ezetimibe (ZETIA) 10 MG tablet Take 10 mg by mouth daily.     feeding supplement (ENSURE ENLIVE / ENSURE  PLUS) LIQD Take 237 mLs by mouth 3 (three) times daily between meals. 237 mL 12   guanFACINE (INTUNIV) 1 MG TB24 ER tablet Take 1 mg by mouth daily.     hydrALAZINE (APRESOLINE) 50 MG tablet Take 1.5 tablets (75 mg total) by mouth 3 (three) times daily. 405 tablet 3   levocetirizine (XYZAL) 5 MG tablet Take 5 mg by mouth at bedtime.     loteprednol (LOTEMAX) 0.5 % ophthalmic suspension 1 drop daily.     megestrol (MEGACE) 40 MG tablet Take 40 mg by mouth 2 (two) times daily.     olmesartan (BENICAR) 40 MG tablet Take 40 mg by mouth daily.     omeprazole (PRILOSEC) 40 MG capsule TAKE 1 CAPSULE(40 MG) BY MOUTH DAILY 90 capsule 3   polyethylene glycol powder (GLYCOLAX/MIRALAX) 17 GM/SCOOP powder Take 1 Container by mouth as needed for mild constipation.     potassium chloride (KLOR-CON) 10 MEQ tablet Take 10 mEq by mouth 3 (three) times daily.     sertraline (ZOLOFT) 50 MG tablet Take 50 mg by mouth daily.     sucralfate (CARAFATE) 1 g tablet Take 1 g by mouth 4 (four) times daily -  with meals and at bedtime.     No current facility-administered medications for this visit.    Allergies as of 04/27/2022 - Review Complete 04/27/2022  Allergen Reaction Noted   Apple juice Other (See Comments) 07/03/2015   Aspirin Other (See Comments) 01/10/2007   Avelox [moxifloxacin hcl in nacl] Hives 02/19/2013   Bee venom Other (See Comments) 10/25/2013   Benadryl [diphenhydramine hcl] Other (See Comments) 03/14/2011   Chicken allergy Other (See Comments) 10/25/2013   Codeine Other (See Comments) 10/25/2013   Factive [gemifloxacin mesylate] Hives 03/14/2011   Factive [gemifloxacin] Hives 02/19/2013   Hctz [hydrochlorothiazide]  07/16/2020   Iodinated contrast media Other (See Comments) 06/17/2015   Iodine  02/07/2016   Levaquin [levofloxacin in d5w] Hives 02/19/2013   Levofloxacin Hives 03/14/2011   Penicillin v  01/10/2007   Penicillins Other (See Comments) 03/14/2011   Sulfa antibiotics Nausea Only  01/10/2007   Tequin [gatifloxacin] Hives 02/19/2013    Family History  Problem Relation Age of Onset   Hypertension Mother    Prostate cancer Father     Social History   Socioeconomic History   Marital status: Widowed    Spouse name: Not on file   Number of children: Not on file   Years of education: Not on file   Highest education level: Not on file  Occupational History   Not on file  Tobacco Use   Smoking status: Never    Passive exposure: Never   Smokeless tobacco: Never  Vaping Use   Vaping Use: Never used  Substance and Sexual Activity   Alcohol use: No    Alcohol/week: 0.0 standard drinks of alcohol   Drug use: No   Sexual activity: Not on file  Other Topics Concern   Not on  file  Social History Narrative   Not on file   Social Determinants of Health   Financial Resource Strain: Not on file  Food Insecurity: Not on file  Transportation Needs: Not on file  Physical Activity: Not on file  Stress: Not on file  Social Connections: Not on file   Review of systems General: negative for malaise, night sweats, fever, chills, +weight loss Neck: Negative for lumps, goiter, pain and significant neck swelling Resp: Negative for cough, wheezing, dyspnea at rest CV: Negative for chest pain, leg swelling, palpitations, orthopnea GI: denies melena, hematochezia, nausea, vomiting, diarrhea, constipation, dysphagia, odyonophagia, early satiety. + unintentional weight loss.  MSK: Negative for joint pain or swelling, back pain, and muscle pain. Derm: Negative for itching or rash Psych: Denies depression, anxiety, memory loss, confusion. No homicidal or suicidal ideation.  Heme: Negative for prolonged bleeding, bruising easily, and swollen nodes. Endocrine: Negative for cold or heat intolerance, polyuria, polydipsia and goiter. Neuro: negative for tremor, gait imbalance, syncope and seizures. The remainder of the review of systems is noncontributory.  Physical Exam: BP  (!) 149/73 (BP Location: Left Arm, Patient Position: Sitting, Cuff Size: Small)   Pulse (!) 125   Temp 98.4 F (36.9 C) (Oral)   Ht '5\' 3"'$  (1.6 m)   Wt 96 lb 12.8 oz (43.9 kg)   BMI 17.15 kg/m  General:   Alert and oriented. No distress noted. Pleasant and cooperative.  Head:  Normocephalic and atraumatic. Eyes:  Conjuctiva clear without scleral icterus. Mouth:  Oral mucosa pink and moist. Good dentition. No lesions. Heart: Normal rate and rhythm, s1 and s2 heart sounds present.  Lungs: Clear lung sounds in all lobes. Respirations equal and unlabored. Abdomen:  +BS, soft, non-tender and non-distended. No rebound or guarding. No HSM or masses noted. Derm: No palmar erythema or jaundice Msk:  Symmetrical without gross deformities. Normal posture. Extremities:  Without edema. Neurologic:  Alert and  oriented x4 Psych:  Alert and cooperative. Normal mood and affect.  Invalid input(s): "6 MONTHS"   ASSESSMENT: JENNETTA FLOOD is a 81 y.o. female presenting today for follow up of GERD and dysphagia  GERD and dysphagia resolved since starting omeprazole in June. She is doing well and feels that change in PPI has resolved her symptoms. She has no GI complaints today. She continues to lose weight, down 8 pounds since her last OV. Notably with decreased appetite and decline in weight since her spouse passed a few years ago. PCP is monitoring weight and recently started her on Megace which patient's daughter reports she has seen an improvement in her eating since. She has follow up with PCP later this month for weight check. Recent imaging CT chest/abd/pelvis in April 2022 was unremarkable. TSH in feb 2023 2.021.  No other red flag symptoms. Patient denies melena, hematochezia, nausea, vomiting, diarrhea, constipation, dysphagia, odyonophagia, early satiety. She should continue  megace and keep PCP follow up, I encouraged her to increase protein shakes to BID to help with added nutrition and to avoid  further weight loss.   PLAN:  Continue megace  2. Continue omeprazole '40mg'$  daily  3. Keep follow up with PCP on 7/18 4. Liberalize diet 5. Increase protein shakes to BID 6. Continue reflux precautions.   All questions were answered, patient and daughter verbalized understanding and are in agreement with plan as outlined above.    Follow Up: 6 months   Danetta Prom L. Alver Sorrow, MSN, APRN, AGNP-C Adult-Gerontology Nurse Practitioner [facility]

## 2022-04-27 NOTE — Patient Instructions (Signed)
Continue megace  Continue omeprazole '40mg'$  daily  Keep follow up with PCP on 7/18 for weight check Liberalize diet to help with weight maintenance/gain  increase protein shakes to twice a day to help with nutrition  Continue to avoid greasy, spicy, tomato based and citrus foods, stay upright 2-3 hours after eating, before laying down Please let me know if you have any further issues with swallowing  Follow up 6 months

## 2022-04-29 DIAGNOSIS — R634 Abnormal weight loss: Secondary | ICD-10-CM | POA: Insufficient documentation

## 2022-05-08 IMAGING — DX DG CHEST 1V PORT
1 series · 1 of 1 positions shown · non-contrast
Comparison: Chest x-ray 11/28/2020, CT angiography 03/14/2011

CLINICAL DATA: Weakness.  Dizziness

EXAM:
PORTABLE CHEST 1 VIEW

[chest ap]
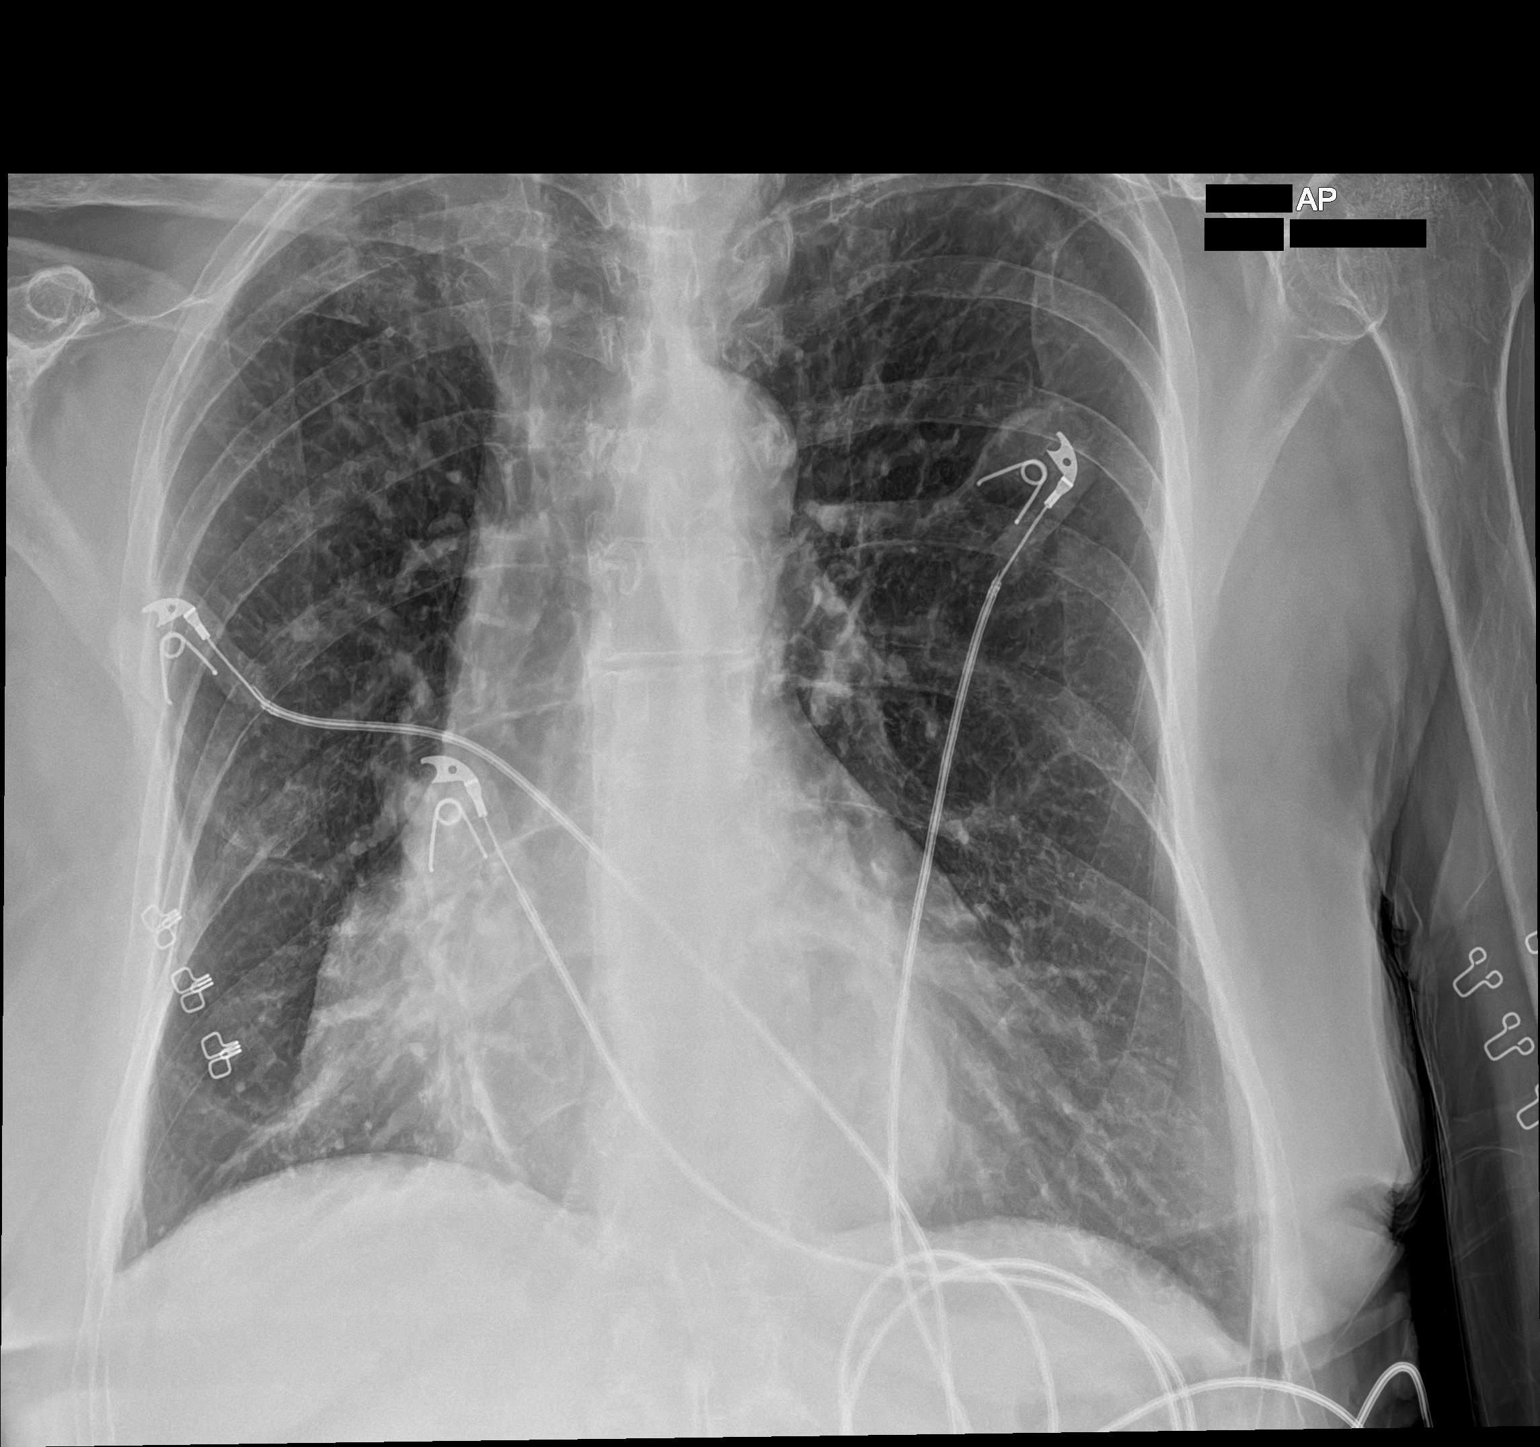

[1 of 1 positions shown; findings below may reference images not displayed]

FINDINGS: The heart size and mediastinal contours are unchanged. Aortic arch
calcification.

Biapical pleural/pulmonary scarring. 6 mm right mid lung zone
nodularity. No focal consolidation. No pulmonary edema. No pleural
effusion. No pneumothorax.

No acute osseous abnormality.

Large hiatal hernia.
IMPRESSION: 1. 6 mm right mid lung zone nodularity.
2. Otherwise no acute cardiopulmonary abnormality.

## 2022-05-09 DIAGNOSIS — R5383 Other fatigue: Secondary | ICD-10-CM | POA: Diagnosis not present

## 2022-05-09 DIAGNOSIS — R519 Headache, unspecified: Secondary | ICD-10-CM | POA: Diagnosis not present

## 2022-05-09 DIAGNOSIS — R000 Tachycardia, unspecified: Secondary | ICD-10-CM | POA: Diagnosis not present

## 2022-05-09 DIAGNOSIS — R112 Nausea with vomiting, unspecified: Secondary | ICD-10-CM | POA: Diagnosis not present

## 2022-05-09 DIAGNOSIS — G4700 Insomnia, unspecified: Secondary | ICD-10-CM | POA: Diagnosis not present

## 2022-05-09 DIAGNOSIS — F331 Major depressive disorder, recurrent, moderate: Secondary | ICD-10-CM | POA: Diagnosis not present

## 2022-05-09 DIAGNOSIS — K5900 Constipation, unspecified: Secondary | ICD-10-CM | POA: Diagnosis not present

## 2022-05-09 DIAGNOSIS — R634 Abnormal weight loss: Secondary | ICD-10-CM | POA: Diagnosis not present

## 2022-05-09 DIAGNOSIS — R42 Dizziness and giddiness: Secondary | ICD-10-CM | POA: Diagnosis not present

## 2022-05-09 DIAGNOSIS — I10 Essential (primary) hypertension: Secondary | ICD-10-CM | POA: Diagnosis not present

## 2022-05-09 DIAGNOSIS — E440 Moderate protein-calorie malnutrition: Secondary | ICD-10-CM | POA: Diagnosis not present

## 2022-05-09 DIAGNOSIS — G3184 Mild cognitive impairment, so stated: Secondary | ICD-10-CM | POA: Diagnosis not present

## 2022-07-29 DIAGNOSIS — J029 Acute pharyngitis, unspecified: Secondary | ICD-10-CM | POA: Diagnosis not present

## 2022-08-11 DIAGNOSIS — R7303 Prediabetes: Secondary | ICD-10-CM | POA: Diagnosis not present

## 2022-08-11 DIAGNOSIS — I10 Essential (primary) hypertension: Secondary | ICD-10-CM | POA: Diagnosis not present

## 2022-08-16 DIAGNOSIS — I10 Essential (primary) hypertension: Secondary | ICD-10-CM | POA: Diagnosis not present

## 2022-08-16 DIAGNOSIS — K219 Gastro-esophageal reflux disease without esophagitis: Secondary | ICD-10-CM | POA: Diagnosis not present

## 2022-08-16 DIAGNOSIS — H20813 Fuchs' heterochromic cyclitis, bilateral: Secondary | ICD-10-CM | POA: Diagnosis not present

## 2022-08-16 DIAGNOSIS — R000 Tachycardia, unspecified: Secondary | ICD-10-CM | POA: Diagnosis not present

## 2022-08-16 DIAGNOSIS — J45909 Unspecified asthma, uncomplicated: Secondary | ICD-10-CM | POA: Diagnosis not present

## 2022-08-16 DIAGNOSIS — E782 Mixed hyperlipidemia: Secondary | ICD-10-CM | POA: Diagnosis not present

## 2022-08-16 DIAGNOSIS — R7303 Prediabetes: Secondary | ICD-10-CM | POA: Diagnosis not present

## 2022-08-16 DIAGNOSIS — Z23 Encounter for immunization: Secondary | ICD-10-CM | POA: Diagnosis not present

## 2022-08-16 DIAGNOSIS — N1832 Chronic kidney disease, stage 3b: Secondary | ICD-10-CM | POA: Diagnosis not present

## 2022-08-16 DIAGNOSIS — E876 Hypokalemia: Secondary | ICD-10-CM | POA: Diagnosis not present

## 2022-08-16 DIAGNOSIS — R001 Bradycardia, unspecified: Secondary | ICD-10-CM | POA: Diagnosis not present

## 2022-08-16 DIAGNOSIS — Z0000 Encounter for general adult medical examination without abnormal findings: Secondary | ICD-10-CM | POA: Diagnosis not present

## 2022-09-04 ENCOUNTER — Other Ambulatory Visit: Payer: Self-pay | Admitting: Cardiology

## 2022-10-19 ENCOUNTER — Ambulatory Visit: Payer: Medicare Other | Admitting: Cardiology

## 2022-10-26 ENCOUNTER — Encounter (INDEPENDENT_AMBULATORY_CARE_PROVIDER_SITE_OTHER): Payer: Self-pay | Admitting: Gastroenterology

## 2022-10-26 ENCOUNTER — Ambulatory Visit (INDEPENDENT_AMBULATORY_CARE_PROVIDER_SITE_OTHER): Payer: Medicare Other | Admitting: Gastroenterology

## 2022-11-06 DIAGNOSIS — H401130 Primary open-angle glaucoma, bilateral, stage unspecified: Secondary | ICD-10-CM | POA: Diagnosis not present

## 2022-11-06 DIAGNOSIS — H401132 Primary open-angle glaucoma, bilateral, moderate stage: Secondary | ICD-10-CM | POA: Diagnosis not present

## 2023-02-13 DIAGNOSIS — R7303 Prediabetes: Secondary | ICD-10-CM | POA: Diagnosis not present

## 2023-02-13 DIAGNOSIS — I10 Essential (primary) hypertension: Secondary | ICD-10-CM | POA: Diagnosis not present

## 2023-02-15 DIAGNOSIS — F331 Major depressive disorder, recurrent, moderate: Secondary | ICD-10-CM | POA: Diagnosis not present

## 2023-02-15 DIAGNOSIS — R000 Tachycardia, unspecified: Secondary | ICD-10-CM | POA: Diagnosis not present

## 2023-02-15 DIAGNOSIS — J45909 Unspecified asthma, uncomplicated: Secondary | ICD-10-CM | POA: Diagnosis not present

## 2023-02-15 DIAGNOSIS — R42 Dizziness and giddiness: Secondary | ICD-10-CM | POA: Diagnosis not present

## 2023-02-15 DIAGNOSIS — G3184 Mild cognitive impairment, so stated: Secondary | ICD-10-CM | POA: Diagnosis not present

## 2023-02-15 DIAGNOSIS — I129 Hypertensive chronic kidney disease with stage 1 through stage 4 chronic kidney disease, or unspecified chronic kidney disease: Secondary | ICD-10-CM | POA: Diagnosis not present

## 2023-02-15 DIAGNOSIS — K219 Gastro-esophageal reflux disease without esophagitis: Secondary | ICD-10-CM | POA: Diagnosis not present

## 2023-02-15 DIAGNOSIS — I10 Essential (primary) hypertension: Secondary | ICD-10-CM | POA: Diagnosis not present

## 2023-02-15 DIAGNOSIS — N1832 Chronic kidney disease, stage 3b: Secondary | ICD-10-CM | POA: Diagnosis not present

## 2023-02-15 DIAGNOSIS — Z0001 Encounter for general adult medical examination with abnormal findings: Secondary | ICD-10-CM | POA: Diagnosis not present

## 2023-02-15 DIAGNOSIS — R809 Proteinuria, unspecified: Secondary | ICD-10-CM | POA: Diagnosis not present

## 2023-02-15 DIAGNOSIS — E782 Mixed hyperlipidemia: Secondary | ICD-10-CM | POA: Diagnosis not present

## 2023-05-12 ENCOUNTER — Other Ambulatory Visit (INDEPENDENT_AMBULATORY_CARE_PROVIDER_SITE_OTHER): Payer: Self-pay | Admitting: Gastroenterology

## 2023-07-11 DIAGNOSIS — I10 Essential (primary) hypertension: Secondary | ICD-10-CM | POA: Diagnosis not present

## 2023-07-11 DIAGNOSIS — R7303 Prediabetes: Secondary | ICD-10-CM | POA: Diagnosis not present

## 2023-07-17 DIAGNOSIS — R001 Bradycardia, unspecified: Secondary | ICD-10-CM | POA: Diagnosis not present

## 2023-07-17 DIAGNOSIS — J45909 Unspecified asthma, uncomplicated: Secondary | ICD-10-CM | POA: Diagnosis not present

## 2023-07-17 DIAGNOSIS — I129 Hypertensive chronic kidney disease with stage 1 through stage 4 chronic kidney disease, or unspecified chronic kidney disease: Secondary | ICD-10-CM | POA: Diagnosis not present

## 2023-07-17 DIAGNOSIS — H20813 Fuchs' heterochromic cyclitis, bilateral: Secondary | ICD-10-CM | POA: Diagnosis not present

## 2023-07-17 DIAGNOSIS — R000 Tachycardia, unspecified: Secondary | ICD-10-CM | POA: Diagnosis not present

## 2023-07-17 DIAGNOSIS — R809 Proteinuria, unspecified: Secondary | ICD-10-CM | POA: Diagnosis not present

## 2023-07-17 DIAGNOSIS — N1832 Chronic kidney disease, stage 3b: Secondary | ICD-10-CM | POA: Diagnosis not present

## 2023-07-17 DIAGNOSIS — K219 Gastro-esophageal reflux disease without esophagitis: Secondary | ICD-10-CM | POA: Diagnosis not present

## 2023-07-17 DIAGNOSIS — R7303 Prediabetes: Secondary | ICD-10-CM | POA: Diagnosis not present

## 2023-07-17 DIAGNOSIS — E782 Mixed hyperlipidemia: Secondary | ICD-10-CM | POA: Diagnosis not present

## 2023-07-17 DIAGNOSIS — I10 Essential (primary) hypertension: Secondary | ICD-10-CM | POA: Diagnosis not present

## 2023-07-17 DIAGNOSIS — E876 Hypokalemia: Secondary | ICD-10-CM | POA: Diagnosis not present

## 2023-08-29 ENCOUNTER — Other Ambulatory Visit: Payer: Self-pay | Admitting: Cardiology

## 2023-10-10 ENCOUNTER — Encounter (HOSPITAL_COMMUNITY): Payer: Self-pay | Admitting: *Deleted

## 2023-10-10 ENCOUNTER — Emergency Department (HOSPITAL_COMMUNITY): Payer: Medicare Other

## 2023-10-10 ENCOUNTER — Other Ambulatory Visit: Payer: Self-pay

## 2023-10-10 ENCOUNTER — Emergency Department (HOSPITAL_COMMUNITY)
Admission: EM | Admit: 2023-10-10 | Discharge: 2023-10-11 | Disposition: A | Discharge status: Home / Self Care | Payer: Medicare Other | Attending: Student | Admitting: Student

## 2023-10-10 DIAGNOSIS — R410 Disorientation, unspecified: Secondary | ICD-10-CM | POA: Diagnosis not present

## 2023-10-10 DIAGNOSIS — I129 Hypertensive chronic kidney disease with stage 1 through stage 4 chronic kidney disease, or unspecified chronic kidney disease: Secondary | ICD-10-CM | POA: Insufficient documentation

## 2023-10-10 DIAGNOSIS — R220 Localized swelling, mass and lump, head: Secondary | ICD-10-CM | POA: Diagnosis not present

## 2023-10-10 DIAGNOSIS — J42 Unspecified chronic bronchitis: Secondary | ICD-10-CM | POA: Diagnosis not present

## 2023-10-10 DIAGNOSIS — R4182 Altered mental status, unspecified: Secondary | ICD-10-CM | POA: Insufficient documentation

## 2023-10-10 DIAGNOSIS — R001 Bradycardia, unspecified: Secondary | ICD-10-CM | POA: Diagnosis not present

## 2023-10-10 DIAGNOSIS — N1831 Chronic kidney disease, stage 3a: Secondary | ICD-10-CM | POA: Diagnosis not present

## 2023-10-10 DIAGNOSIS — K449 Diaphragmatic hernia without obstruction or gangrene: Secondary | ICD-10-CM | POA: Diagnosis not present

## 2023-10-10 DIAGNOSIS — J45909 Unspecified asthma, uncomplicated: Secondary | ICD-10-CM | POA: Diagnosis not present

## 2023-10-10 DIAGNOSIS — D72829 Elevated white blood cell count, unspecified: Secondary | ICD-10-CM | POA: Diagnosis not present

## 2023-10-10 DIAGNOSIS — Z79899 Other long term (current) drug therapy: Secondary | ICD-10-CM | POA: Diagnosis not present

## 2023-10-10 DIAGNOSIS — R944 Abnormal results of kidney function studies: Secondary | ICD-10-CM | POA: Diagnosis not present

## 2023-10-10 DIAGNOSIS — N3000 Acute cystitis without hematuria: Secondary | ICD-10-CM | POA: Diagnosis not present

## 2023-10-10 DIAGNOSIS — R531 Weakness: Secondary | ICD-10-CM | POA: Diagnosis not present

## 2023-10-10 DIAGNOSIS — I700 Atherosclerosis of aorta: Secondary | ICD-10-CM | POA: Diagnosis not present

## 2023-10-10 LAB — URINALYSIS, ROUTINE W REFLEX MICROSCOPIC
Bilirubin Urine: NEGATIVE
Glucose, UA: NEGATIVE mg/dL
Hgb urine dipstick: NEGATIVE
Ketones, ur: NEGATIVE mg/dL
Nitrite: NEGATIVE
Protein, ur: 100 mg/dL — AB
Specific Gravity, Urine: 1.011 (ref 1.005–1.030)
pH: 5 (ref 5.0–8.0)

## 2023-10-10 LAB — CBC WITH DIFFERENTIAL/PLATELET
Abs Immature Granulocytes: 0.01 10*3/uL (ref 0.00–0.07)
Basophils Absolute: 0.1 10*3/uL (ref 0.0–0.1)
Basophils Relative: 1 %
Eosinophils Absolute: 0.3 10*3/uL (ref 0.0–0.5)
Eosinophils Relative: 5 %
HCT: 40 % (ref 36.0–46.0)
Hemoglobin: 12.1 g/dL (ref 12.0–15.0)
Immature Granulocytes: 0 %
Lymphocytes Relative: 30 %
Lymphs Abs: 1.6 10*3/uL (ref 0.7–4.0)
MCH: 29.1 pg (ref 26.0–34.0)
MCHC: 30.3 g/dL (ref 30.0–36.0)
MCV: 96.2 fL (ref 80.0–100.0)
Monocytes Absolute: 0.4 10*3/uL (ref 0.1–1.0)
Monocytes Relative: 8 %
Neutro Abs: 2.9 10*3/uL (ref 1.7–7.7)
Neutrophils Relative %: 56 %
Platelets: 252 10*3/uL (ref 150–400)
RBC: 4.16 MIL/uL (ref 3.87–5.11)
RDW: 15.4 % (ref 11.5–15.5)
WBC: 5.2 10*3/uL (ref 4.0–10.5)
nRBC: 0 % (ref 0.0–0.2)

## 2023-10-10 LAB — RAPID URINE DRUG SCREEN, HOSP PERFORMED
Amphetamines: NOT DETECTED
Barbiturates: NOT DETECTED
Benzodiazepines: NOT DETECTED
Cocaine: NOT DETECTED
Opiates: NOT DETECTED
Tetrahydrocannabinol: NOT DETECTED

## 2023-10-10 LAB — COMPREHENSIVE METABOLIC PANEL
ALT: 12 U/L (ref 0–44)
AST: 25 U/L (ref 15–41)
Albumin: 3.7 g/dL (ref 3.5–5.0)
Alkaline Phosphatase: 65 U/L (ref 38–126)
Anion gap: 9 (ref 5–15)
BUN: 33 mg/dL — ABNORMAL HIGH (ref 8–23)
CO2: 26 mmol/L (ref 22–32)
Calcium: 9.6 mg/dL (ref 8.9–10.3)
Chloride: 103 mmol/L (ref 98–111)
Creatinine, Ser: 2.05 mg/dL — ABNORMAL HIGH (ref 0.44–1.00)
GFR, Estimated: 23 mL/min — ABNORMAL LOW (ref >60)
Glucose, Bld: 114 mg/dL — ABNORMAL HIGH (ref 70–99)
Potassium: 4.5 mmol/L (ref 3.5–5.1)
Sodium: 138 mmol/L (ref 135–145)
Total Bilirubin: 0.4 mg/dL (ref <1.2)
Total Protein: 7.6 g/dL (ref 6.5–8.1)

## 2023-10-10 MED ORDER — LACTATED RINGERS IV BOLUS
1000.0000 mL | Freq: Once | INTRAVENOUS | Status: AC
  Administered 2023-10-10: 1000 mL via INTRAVENOUS

## 2023-10-10 MED ORDER — FOSFOMYCIN TROMETHAMINE 3 G PO PACK
3.0000 g | PACK | Freq: Once | ORAL | Status: AC
  Administered 2023-10-11: 3 g via ORAL

## 2023-10-10 NOTE — ED Notes (Signed)
Stephanie West, [facility] made aware that ED pyxis does not have Monurol stocked- request this medication from upstairs pharmacy

## 2023-10-10 NOTE — ED Triage Notes (Signed)
Pt's family reports over the last 3 weeks pt has started having new confusion and difficulty making her words. Pt reports bilateral leg weakness, worse on her right x a few days. Pt currently A&O in triage, but does report feeling confused at times. Face symmetrical, no arm or leg drift, but does report it's harder for her to keep her right leg elevated compared to her left.

## 2023-10-10 NOTE — ED Provider Triage Note (Signed)
Emergency Medicine Provider Triage Evaluation Note  Stephanie West , a 82 y.o. female  was evaluated in triage.  Pt complains of dizziness and confusion.  She is accompanied by her daughter who states she is complained of dizziness for weeks to months.  Patient notes she has had confusion x 2 weeks.  Daughter states it has been 3 weeks.  States she has has difficulty making her words at times.  Patient denies any pain, headache, visual changes facial numbness or extremity weakness.  No back pain or dysuria symptoms.  No fevers or chills.  Daughter states that she seems to have difficulty lifting her right leg to get in and out of the car but denies any changes in gait.  Does not use walker or cane at baseline  Review of Systems  Positive: Confusion, dizziness Negative: Facial weakness, droop, extremity weakness or numbness, nausea, vomiting, fever, chills, chest pain or abdominal pain.  Physical Exam  BP (!) 159/66 (BP Location: Right Arm)   Pulse 89   Temp 98.1 F (36.7 C) (Oral)   Resp 18   Ht 5\' 3"  (1.6 m)   Wt 54.4 kg   SpO2 96%   BMI 21.26 kg/m  Gen:   Awake, no distress   Resp:  Normal effort  MSK:   Moves extremities without difficulty  Other:  Alert and oriented x 3  Medical Decision Making  Medically screening exam initiated at 5:39 PM.  Appropriate orders placed.  [name] was informed that the remainder of the evaluation will be completed by another provider, this initial triage assessment does not replace that evaluation, and the importance of remaining in the ED until their evaluation is complete.     [name], PA-C 10/10/23 1745

## 2023-10-11 DIAGNOSIS — N3000 Acute cystitis without hematuria: Secondary | ICD-10-CM | POA: Diagnosis not present

## 2023-10-11 NOTE — ED Provider Notes (Signed)
[place] EMERGENCY DEPARTMENT AT [facility] Provider Note  CSN: [id number] Arrival date & time: 10/10/23 1659  Chief Complaint(s) Altered Mental Status  HPI Stephanie West is a 82 y.o. female with PMH asthma, meningioma, MVP, CKD who presents emergency room for evaluation of progressive confusion and altered mental status.  History obtained from patient's daughter who states that over the last 3 weeks she has had worsening memory difficulties and intermittent bilateral leg weakness right greater than left.  States that she has started to forget family members names and how to sign her name.  She states that she will have difficulty getting in and out of the car with moving her right leg.  Denies chest pain, shortness of breath, abdominal pain, vomiting, diarrhea, fever or other systemic symptoms.  Here in the emergency department, patient is alert, not encephalopathic and will answer questions appropriately.     Past Medical History Past Medical History:  Diagnosis Date   Asthma    Dysphagia 05/10/2017   Elevated transaminase level    GERD (gastroesophageal reflux disease)    Hypertension    MVP (mitral valve prolapse)    Patient Active Problem List   Diagnosis Date Noted   Loss of weight 04/29/2022   Hyperkalemia 11/30/2021   Acute renal failure superimposed on stage 3a chronic kidney disease (HCC) 11/30/2021   Symptomatic bradycardia 11/29/2021   Hypertension 10/20/2021   Constipation 06/16/2021   Anorexia    Malnutrition of moderate degree 02/03/2021   Hypochloremia 02/02/2021   Anxiety 02/02/2021   Acute metabolic encephalopathy 02/02/2021   Schatzki's ring 06/15/2020   Hyponatremia 07/02/2019   Dysphagia 05/10/2017   Salmonella gastroenteritis 02/11/2016   Chronic renal failure, stage 3 (moderate) (HCC) 02/10/2016   Generalized anxiety disorder 02/10/2016   Acute renal failure (ARF) (HCC) 02/08/2016   Nausea without vomiting 02/08/2016   Intractable  nausea and vomiting 02/08/2016   Gastroesophageal reflux disease 02/13/2012   Asthma 02/13/2012   Hypertensive crisis 02/13/2012   Transaminitis 02/13/2012   Home Medication(s) Prior to Admission medications   Medication Sig Start Date End Date Taking? Authorizing Provider  sertraline (ZOLOFT) 25 MG tablet Take 25 mg by mouth daily. 08/16/22  Yes [provider]  acetaminophen (TYLENOL) 325 MG tablet Take 2 tablets (650 mg total) by mouth every 6 (six) hours as needed for mild pain (or Fever >/= 101). 02/11/21   Emokpae, Courage, MD  amLODipine (NORVASC) 5 MG tablet Take 1 tablet (5 mg total) by mouth daily. 12/01/21   Catarina Hartshorn, MD  cloNIDine (CATAPRES - DOSED IN MG/24 HR) 0.2 mg/24hr patch Place 1 patch (0.2 mg total) onto the skin once a week. Every Thursday 02/17/21   Shon Hale, MD  donepezil (ARICEPT) 10 MG tablet Take 10 mg by mouth at bedtime. 08/16/22   [provider]  ezetimibe (ZETIA) 10 MG tablet Take 10 mg by mouth daily.    [provider]  feeding supplement (ENSURE ENLIVE / ENSURE PLUS) LIQD Take 237 mLs by mouth 3 (three) times daily between meals. Patient taking differently: Take 237 mLs by mouth every other day. 02/11/21   Shon Hale, MD  guanFACINE (INTUNIV) 1 MG TB24 ER tablet Take 1 mg by mouth daily. 04/01/20   [provider]  hydrALAZINE (APRESOLINE) 50 MG tablet TAKE 1 TABLET THREE TIMES A DAY 08/29/23   Antoine Poche, MD  levocetirizine (XYZAL) 5 MG tablet Take 5 mg by mouth at bedtime. 10/14/21   [provider]  loteprednol (LOTEMAX) 0.5 % ophthalmic suspension 1 drop daily. 03/17/22   [provider]  megestrol (MEGACE) 40 MG tablet Take 40 mg by mouth 2 (two) times daily. 04/11/22   [provider]  olmesartan (BENICAR) 40 MG tablet Take 40 mg by mouth daily.    [provider]  omeprazole (PRILOSEC) 40 MG capsule TAKE 1 CAPSULE(40 MG) BY MOUTH DAILY 04/20/22   Carlan, Chelsea L, NP   polyethylene glycol powder (GLYCOLAX/MIRALAX) 17 GM/SCOOP powder Take 1 Container by mouth as needed for mild constipation.    [provider]  potassium chloride (KLOR-CON) 10 MEQ tablet Take 10 mEq by mouth 3 (three) times daily. 02/27/22   [provider]  sucralfate (CARAFATE) 1 g tablet Take 1 g by mouth 4 (four) times daily -  with meals and at bedtime.    [provider]                                                                                                                                    Past Surgical History Past Surgical History:  Procedure Laterality Date   COLONOSCOPY     ESOPHAGEAL DILATION  07/30/2015   Procedure: ESOPHAGEAL DILATION;  Surgeon: Malissa Hippo, MD;  Location: [facility];  Service: Endoscopy;;   ESOPHAGOGASTRODUODENOSCOPY N/A 04/15/2015   Procedure: ESOPHAGOGASTRODUODENOSCOPY (EGD);  Surgeon: Malissa Hippo, MD;  Location: [facility];  Service: Endoscopy;  Laterality: N/A;  125 - moved to 1:00, Ann to notify pt   ESOPHAGOGASTRODUODENOSCOPY N/A 07/30/2015   Procedure: ESOPHAGOGASTRODUODENOSCOPY (EGD);  Surgeon: Malissa Hippo, MD;  Location: [facility];  Service: Endoscopy;  Laterality: N/A;  1040   ESOPHAGOGASTRODUODENOSCOPY (EGD) WITH ESOPHAGEAL DILATION N/A 03/07/2013   Procedure: ESOPHAGOGASTRODUODENOSCOPY (EGD) WITH ESOPHAGEAL DILATION;  Surgeon: Malissa Hippo, MD;  Location: [facility];  Service: Endoscopy;  Laterality: N/A;  830   EYE SURGERY     LAPAROSCOPIC TUBAL LIGATION  1984   UPPER GASTROINTESTINAL ENDOSCOPY     Family History Family History  Problem Relation Age of Onset   Hypertension Mother    Prostate cancer Father     Social History Social History   Tobacco Use   Smoking status: Never    Passive exposure: Never   Smokeless tobacco: Never  Vaping Use   Vaping status: Never Used  Substance Use Topics   Alcohol use: No    Alcohol/week: 0.0 standard drinks of alcohol   Drug use: No    Allergies Apple juice, Aspirin, Avelox [moxifloxacin hcl in nacl], Bee venom, Benadryl [diphenhydramine hcl], Chicken allergy, Codeine, Factive [gemifloxacin mesylate], Factive [gemifloxacin], Hctz [hydrochlorothiazide], Iodinated contrast media, Iodine, Levaquin [levofloxacin in d5w], Levofloxacin, Penicillin v, Penicillins, Sulfa antibiotics, and Tequin [gatifloxacin]  Review of Systems Review of Systems  Neurological:  Positive for weakness.  Psychiatric/Behavioral:  Positive for confusion.     Physical Exam Vital Signs  I have reviewed the  triage vital signs BP (!) 155/81   Pulse 77   Temp 97.7 F (36.5 C) (Oral)   Resp 18   Ht 5\' 3"  (1.6 m)   Wt 54.4 kg   SpO2 97%   BMI 21.26 kg/m   Physical Exam Vitals and nursing note reviewed.  Constitutional:      General: She is not in acute distress.    Appearance: She is well-developed.  HENT:     Head: Normocephalic and atraumatic.  Eyes:     Conjunctiva/sclera: Conjunctivae normal.  Cardiovascular:     Rate and Rhythm: Normal rate and regular rhythm.     Heart sounds: No murmur heard. Pulmonary:     Effort: Pulmonary effort is normal. No respiratory distress.     Breath sounds: Normal breath sounds.  Abdominal:     Palpations: Abdomen is soft.     Tenderness: There is no abdominal tenderness.  Musculoskeletal:        General: No swelling.     Cervical back: Neck supple.  Skin:    General: Skin is warm and dry.     Capillary Refill: Capillary refill takes less than 2 seconds.  Neurological:     General: No focal deficit present.     Mental Status: She is alert.     Cranial Nerves: No cranial nerve deficit.     Sensory: No sensory deficit.     Motor: No weakness.     Gait: Gait normal.  Psychiatric:        Mood and Affect: Mood normal.     ED Results and Treatments Labs (all labs ordered are listed, but only abnormal results are displayed) Labs Reviewed  COMPREHENSIVE METABOLIC PANEL - Abnormal; Notable  for the following components:      Result Value   Glucose, Bld 114 (*)    BUN 33 (*)    Creatinine, Ser 2.05 (*)    GFR, Estimated 23 (*)    All other components within normal limits  URINALYSIS, ROUTINE W REFLEX MICROSCOPIC - Abnormal; Notable for the following components:   Protein, ur 100 (*)    Leukocytes,Ua SMALL (*)    Bacteria, UA RARE (*)    All other components within normal limits  URINE CULTURE  CBC WITH DIFFERENTIAL/PLATELET  RAPID URINE DRUG SCREEN, HOSP PERFORMED                                                                                                                          Radiology DG Chest 2 View Result Date: 10/10/2023 CLINICAL DATA:  Weakness EXAM: CHEST - 2 VIEW COMPARISON:  Radiograph 02/02/2021 FINDINGS: Stable cardiomediastinal silhouette. Aortic atherosclerotic calcification. Hyperinflation and chronic bronchitic change. Bibasilar atelectasis/scarring. Opacity projecting over the right apex is presumed projectional. Otherwise no focal consolidation, pleural effusion, or pneumothorax. No displaced rib fractures. Hiatal hernia. IMPRESSION: No active cardiopulmonary disease. Electronically Signed   By: Minerva Fester M.D.   On: 10/10/2023 19:15   CT Head  Wo Contrast Result Date: 10/10/2023 CLINICAL DATA:  Confusion and difficulty making more, bilateral leg weakness EXAM: CT HEAD WITHOUT CONTRAST TECHNIQUE: Contiguous axial images were obtained from the base of the skull through the vertex without intravenous contrast. RADIATION DOSE REDUCTION: This exam was performed according to the departmental dose-optimization program which includes automated exposure control, adjustment of the mA and/or kV according to patient size and/or use of iterative reconstruction technique. COMPARISON:  03/23/2022 FINDINGS: Brain: Redemonstrated extra-axial mass along the posterior left frontal convexity, which measures up to 4.4 x 4.3 x 3.6 cm (AP x TR x CC) (series 2, image 22 and  series 5, image 38), previously 4.3 x 4.4 x 3.4 cm when remeasured similarly. As before, there is mass effect on the underlying left frontal lobe, without significant edema, and 6 mm of left-to-right midline shift at the level of the anterior frontal lobes, which appears unchanged. No evidence of acute infarct, hemorrhage, hydrocephalus, or extra-axial collection. The basilar cisterns are patent. Gray-white differentiation is preserved. Periventricular white matter changes, likely the sequela of chronic small vessel ischemic disease. Vascular: No hyperdense vessel. Skull: Negative for fracture or focal lesion. Sinuses/Orbits: Sequela of prior endoscopic sinus surgery. Mucosal thickening in the ethmoid air cells, sphenoid sinuses, and maxillary sinuses, with osseous thickening compatible chronic sinusitis. No acute finding in the orbits. Other: The mastoids are well aerated. IMPRESSION: 1. No acute intracranial process. 2. Redemonstrated meningioma along the posterior left frontal convexity, which appears unchanged in size, with unchanged mass effect on the underlying left frontal lobe and 6 mm of left-to-right midline shift. Electronically Signed   By: Wiliam Ke M.D.   On: 10/10/2023 18:32    Pertinent labs & imaging results that were available during my care of the patient were reviewed by me and considered in my medical decision making (see MDM for details).  Medications Ordered in ED Medications  lactated ringers bolus 1,000 mL (0 mLs Intravenous Stopped 10/10/23 2229)  fosfomycin (MONUROL) packet 3 g (3 g Oral Given 10/11/23 0021)                                                                                                                                     Procedures Procedures  (including critical care time)  Medical Decision Making / ED Course   This patient presents to the ED for concern of weakness, confusion, this involves an extensive number of treatment options, and is a  complaint that carries with it a high risk of complications and morbidity.  The differential diagnosis includes infection, metabolic/toxic encephalopathy, hypoglycemia, malperfusion, hypoxia, trauma or other intracranial process  MDM: Patient seen emergency room for evaluation of confusion and intermittent weakness.  Exam is largely unremarkable with no focal motor or sensory deficits, no cranial nerve deficits.  Gait is slow and shuffling, but the lower extremity weakness is not demonstrated on exam here in the ER.  Laboratory evaluation with  a mild AKI with a BUN of 33, creatinine 2.05, and fluid resuscitation begun.  Chest x-ray unremarkable.  CT head with a redemonstrated meningioma unchanged from previous with unchanged mass effect in the left frontal lobe and mild left-to-right midline shift.  The patient has reportedly had this for years and as they describe behavior is new over the last 3 weeks I have lower suspicion that the meningioma is symptomatic today.  Urinalysis is consistent with a possible UTI with a small leuk esterase, 11-20 white blood cells and rare bacteria.  Will send for culture.  With normal neurologic exam and no encephalopathy here in the ER, she does not meet inpatient criteria for admission.  We will cover with fosfomycin and monitor for improvement at home.  If symptoms not improving, and outpatient neurologic consultation for dementia evaluation may be helpful.  I placed an outpatient neurology referral.  Low suspicion for acute CVA today given nonfocal neurologic exam. return precautions given of which the patient and her daughter voiced understanding.  Patient discharged with outpatient follow-up   Additional history obtained: -Additional history obtained from daughter -External records from outside source obtained and reviewed including: Chart review including previous notes, labs, imaging, consultation notes   Lab Tests: -I ordered, reviewed, and interpreted labs.    The pertinent results include:   Labs Reviewed  COMPREHENSIVE METABOLIC PANEL - Abnormal; Notable for the following components:      Result Value   Glucose, Bld 114 (*)    BUN 33 (*)    Creatinine, Ser 2.05 (*)    GFR, Estimated 23 (*)    All other components within normal limits  URINALYSIS, ROUTINE W REFLEX MICROSCOPIC - Abnormal; Notable for the following components:   Protein, ur 100 (*)    Leukocytes,Ua SMALL (*)    Bacteria, UA RARE (*)    All other components within normal limits  URINE CULTURE  CBC WITH DIFFERENTIAL/PLATELET  RAPID URINE DRUG SCREEN, HOSP PERFORMED      EKG   EKG Interpretation Date/Time:  Wednesday October 10 2023 17:23:56 EST Ventricular Rate:  95 PR Interval:  108 QRS Duration:  74 QT Interval:  370 QTC Calculation: 464 R Axis:   70  Text Interpretation: Sinus rhythm with short PR Nonspecific ST and T wave abnormality Abnormal ECG When compared with ECG of 23-Mar-2022 17:44, Nonspecific T wave abnormality now evident in Inferior leads T wave inversion now evident in Lateral leads Confirmed by Virgina Norfolk [phone]) on 10/11/2023 10:29:50 AM         Imaging Studies ordered: I ordered imaging studies including CT head, chest x-ray I independently visualized and interpreted imaging. I agree with the radiologist interpretation   Medicines ordered and prescription drug management: Meds ordered this encounter  Medications   lactated ringers bolus 1,000 mL   fosfomycin (MONUROL) packet 3 g    -I have reviewed the patients home medicines and have made adjustments as needed  Critical interventions none    Cardiac Monitoring: The patient was maintained on a cardiac monitor.  I personally viewed and interpreted the cardiac monitored which showed an underlying rhythm of: NSR  Social Determinants of Health:  Factors impacting patients care include: none   Reevaluation: After the interventions noted above, I reevaluated the patient and  found that they have :stayed the same  Co morbidities that complicate the patient evaluation  Past Medical History:  Diagnosis Date   Asthma    Dysphagia 05/10/2017   Elevated transaminase level  GERD (gastroesophageal reflux disease)    Hypertension    MVP (mitral valve prolapse)       Dispostion: I considered admission for this patient, but at this time she does not meet inpatient criteria for admission and will be discharged with outpatient follow-up     Final Clinical Impression(s) / ED Diagnoses Final diagnoses:  Acute cystitis without hematuria  Confusion     @PCDICTATION @    Glendora Score, MD 10/11/23 1212

## 2023-10-12 LAB — URINE CULTURE: Culture: NO GROWTH

## 2023-10-18 DIAGNOSIS — I129 Hypertensive chronic kidney disease with stage 1 through stage 4 chronic kidney disease, or unspecified chronic kidney disease: Secondary | ICD-10-CM | POA: Diagnosis not present

## 2023-10-18 DIAGNOSIS — N390 Urinary tract infection, site not specified: Secondary | ICD-10-CM | POA: Diagnosis not present

## 2023-10-18 DIAGNOSIS — G3184 Mild cognitive impairment, so stated: Secondary | ICD-10-CM | POA: Diagnosis not present

## 2023-10-18 DIAGNOSIS — R410 Disorientation, unspecified: Secondary | ICD-10-CM | POA: Diagnosis not present

## 2023-10-18 DIAGNOSIS — R296 Repeated falls: Secondary | ICD-10-CM | POA: Diagnosis not present

## 2023-10-18 DIAGNOSIS — N1832 Chronic kidney disease, stage 3b: Secondary | ICD-10-CM | POA: Diagnosis not present

## 2023-10-18 DIAGNOSIS — F331 Major depressive disorder, recurrent, moderate: Secondary | ICD-10-CM | POA: Diagnosis not present

## 2023-11-09 DIAGNOSIS — R7303 Prediabetes: Secondary | ICD-10-CM | POA: Diagnosis not present

## 2023-11-09 DIAGNOSIS — I10 Essential (primary) hypertension: Secondary | ICD-10-CM | POA: Diagnosis not present

## 2023-11-26 DIAGNOSIS — R000 Tachycardia, unspecified: Secondary | ICD-10-CM | POA: Diagnosis not present

## 2023-11-26 DIAGNOSIS — J45909 Unspecified asthma, uncomplicated: Secondary | ICD-10-CM | POA: Diagnosis not present

## 2023-11-26 DIAGNOSIS — R296 Repeated falls: Secondary | ICD-10-CM | POA: Diagnosis not present

## 2023-11-26 DIAGNOSIS — R600 Localized edema: Secondary | ICD-10-CM | POA: Diagnosis not present

## 2023-11-26 DIAGNOSIS — F331 Major depressive disorder, recurrent, moderate: Secondary | ICD-10-CM | POA: Diagnosis not present

## 2023-11-26 DIAGNOSIS — N1832 Chronic kidney disease, stage 3b: Secondary | ICD-10-CM | POA: Diagnosis not present

## 2023-11-26 DIAGNOSIS — H20813 Fuchs' heterochromic cyclitis, bilateral: Secondary | ICD-10-CM | POA: Diagnosis not present

## 2023-11-26 DIAGNOSIS — I129 Hypertensive chronic kidney disease with stage 1 through stage 4 chronic kidney disease, or unspecified chronic kidney disease: Secondary | ICD-10-CM | POA: Diagnosis not present

## 2023-11-26 DIAGNOSIS — K219 Gastro-esophageal reflux disease without esophagitis: Secondary | ICD-10-CM | POA: Diagnosis not present

## 2023-11-26 DIAGNOSIS — E782 Mixed hyperlipidemia: Secondary | ICD-10-CM | POA: Diagnosis not present

## 2023-11-26 DIAGNOSIS — I10 Essential (primary) hypertension: Secondary | ICD-10-CM | POA: Diagnosis not present

## 2023-11-26 DIAGNOSIS — G3184 Mild cognitive impairment, so stated: Secondary | ICD-10-CM | POA: Diagnosis not present

## 2024-02-18 ENCOUNTER — Inpatient Hospital Stay (HOSPITAL_COMMUNITY)

## 2024-02-18 ENCOUNTER — Other Ambulatory Visit: Payer: Self-pay

## 2024-02-18 ENCOUNTER — Encounter (HOSPITAL_COMMUNITY): Payer: Self-pay

## 2024-02-18 ENCOUNTER — Emergency Department (HOSPITAL_COMMUNITY)

## 2024-02-18 ENCOUNTER — Inpatient Hospital Stay (HOSPITAL_COMMUNITY)
Admission: EM | Admit: 2024-02-18 | Discharge: 2024-02-21 | DRG: 535 | Disposition: E | Attending: Internal Medicine | Admitting: Internal Medicine

## 2024-02-18 DIAGNOSIS — R000 Tachycardia, unspecified: Secondary | ICD-10-CM | POA: Diagnosis not present

## 2024-02-18 DIAGNOSIS — E860 Dehydration: Secondary | ICD-10-CM | POA: Diagnosis not present

## 2024-02-18 DIAGNOSIS — R634 Abnormal weight loss: Secondary | ICD-10-CM | POA: Diagnosis not present

## 2024-02-18 DIAGNOSIS — Z681 Body mass index (BMI) 19 or less, adult: Secondary | ICD-10-CM | POA: Diagnosis not present

## 2024-02-18 DIAGNOSIS — I129 Hypertensive chronic kidney disease with stage 1 through stage 4 chronic kidney disease, or unspecified chronic kidney disease: Secondary | ICD-10-CM | POA: Diagnosis not present

## 2024-02-18 DIAGNOSIS — I2489 Other forms of acute ischemic heart disease: Secondary | ICD-10-CM | POA: Diagnosis present

## 2024-02-18 DIAGNOSIS — N189 Chronic kidney disease, unspecified: Secondary | ICD-10-CM | POA: Diagnosis not present

## 2024-02-18 DIAGNOSIS — R627 Adult failure to thrive: Secondary | ICD-10-CM | POA: Diagnosis not present

## 2024-02-18 DIAGNOSIS — J9601 Acute respiratory failure with hypoxia: Secondary | ICD-10-CM | POA: Diagnosis not present

## 2024-02-18 DIAGNOSIS — I5033 Acute on chronic diastolic (congestive) heart failure: Secondary | ICD-10-CM

## 2024-02-18 DIAGNOSIS — Z882 Allergy status to sulfonamides status: Secondary | ICD-10-CM

## 2024-02-18 DIAGNOSIS — M85861 Other specified disorders of bone density and structure, right lower leg: Secondary | ICD-10-CM | POA: Diagnosis not present

## 2024-02-18 DIAGNOSIS — M79601 Pain in right arm: Secondary | ICD-10-CM | POA: Diagnosis not present

## 2024-02-18 DIAGNOSIS — W19XXXA Unspecified fall, initial encounter: Secondary | ICD-10-CM

## 2024-02-18 DIAGNOSIS — I4892 Unspecified atrial flutter: Secondary | ICD-10-CM | POA: Diagnosis present

## 2024-02-18 DIAGNOSIS — K219 Gastro-esophageal reflux disease without esophagitis: Secondary | ICD-10-CM | POA: Diagnosis present

## 2024-02-18 DIAGNOSIS — I160 Hypertensive urgency: Secondary | ICD-10-CM | POA: Diagnosis present

## 2024-02-18 DIAGNOSIS — N184 Chronic kidney disease, stage 4 (severe): Secondary | ICD-10-CM | POA: Diagnosis not present

## 2024-02-18 DIAGNOSIS — Z539 Procedure and treatment not carried out, unspecified reason: Secondary | ICD-10-CM | POA: Diagnosis not present

## 2024-02-18 DIAGNOSIS — M7989 Other specified soft tissue disorders: Secondary | ICD-10-CM | POA: Diagnosis not present

## 2024-02-18 DIAGNOSIS — Z66 Do not resuscitate: Secondary | ICD-10-CM | POA: Diagnosis present

## 2024-02-18 DIAGNOSIS — I471 Supraventricular tachycardia, unspecified: Secondary | ICD-10-CM | POA: Diagnosis not present

## 2024-02-18 DIAGNOSIS — M25461 Effusion, right knee: Secondary | ICD-10-CM | POA: Diagnosis not present

## 2024-02-18 DIAGNOSIS — M25521 Pain in right elbow: Secondary | ICD-10-CM | POA: Diagnosis not present

## 2024-02-18 DIAGNOSIS — Z880 Allergy status to penicillin: Secondary | ICD-10-CM

## 2024-02-18 DIAGNOSIS — W1830XA Fall on same level, unspecified, initial encounter: Secondary | ICD-10-CM | POA: Diagnosis present

## 2024-02-18 DIAGNOSIS — F0390 Unspecified dementia without behavioral disturbance: Secondary | ICD-10-CM | POA: Diagnosis not present

## 2024-02-18 DIAGNOSIS — S72001A Fracture of unspecified part of neck of right femur, initial encounter for closed fracture: Principal | ICD-10-CM

## 2024-02-18 DIAGNOSIS — Z91018 Allergy to other foods: Secondary | ICD-10-CM

## 2024-02-18 DIAGNOSIS — D509 Iron deficiency anemia, unspecified: Secondary | ICD-10-CM | POA: Diagnosis present

## 2024-02-18 DIAGNOSIS — J45909 Unspecified asthma, uncomplicated: Secondary | ICD-10-CM | POA: Diagnosis present

## 2024-02-18 DIAGNOSIS — R296 Repeated falls: Secondary | ICD-10-CM | POA: Diagnosis not present

## 2024-02-18 DIAGNOSIS — Z8249 Family history of ischemic heart disease and other diseases of the circulatory system: Secondary | ICD-10-CM | POA: Diagnosis not present

## 2024-02-18 DIAGNOSIS — I341 Nonrheumatic mitral (valve) prolapse: Secondary | ICD-10-CM | POA: Diagnosis not present

## 2024-02-18 DIAGNOSIS — K449 Diaphragmatic hernia without obstruction or gangrene: Secondary | ICD-10-CM | POA: Diagnosis not present

## 2024-02-18 DIAGNOSIS — M1611 Unilateral primary osteoarthritis, right hip: Secondary | ICD-10-CM | POA: Diagnosis not present

## 2024-02-18 DIAGNOSIS — Z604 Social exclusion and rejection: Secondary | ICD-10-CM | POA: Diagnosis not present

## 2024-02-18 DIAGNOSIS — M1711 Unilateral primary osteoarthritis, right knee: Secondary | ICD-10-CM | POA: Diagnosis not present

## 2024-02-18 DIAGNOSIS — Z515 Encounter for palliative care: Secondary | ICD-10-CM

## 2024-02-18 DIAGNOSIS — E871 Hypo-osmolality and hyponatremia: Secondary | ICD-10-CM | POA: Diagnosis present

## 2024-02-18 DIAGNOSIS — Z79899 Other long term (current) drug therapy: Secondary | ICD-10-CM

## 2024-02-18 DIAGNOSIS — Z9851 Tubal ligation status: Secondary | ICD-10-CM

## 2024-02-18 DIAGNOSIS — M160 Bilateral primary osteoarthritis of hip: Secondary | ICD-10-CM | POA: Diagnosis not present

## 2024-02-18 DIAGNOSIS — M25551 Pain in right hip: Secondary | ICD-10-CM | POA: Diagnosis not present

## 2024-02-18 DIAGNOSIS — D649 Anemia, unspecified: Secondary | ICD-10-CM | POA: Diagnosis not present

## 2024-02-18 DIAGNOSIS — S72011A Unspecified intracapsular fracture of right femur, initial encounter for closed fracture: Principal | ICD-10-CM | POA: Diagnosis present

## 2024-02-18 DIAGNOSIS — Z043 Encounter for examination and observation following other accident: Secondary | ICD-10-CM | POA: Diagnosis not present

## 2024-02-18 DIAGNOSIS — I130 Hypertensive heart and chronic kidney disease with heart failure and stage 1 through stage 4 chronic kidney disease, or unspecified chronic kidney disease: Secondary | ICD-10-CM | POA: Diagnosis not present

## 2024-02-18 DIAGNOSIS — Z885 Allergy status to narcotic agent status: Secondary | ICD-10-CM

## 2024-02-18 DIAGNOSIS — I10 Essential (primary) hypertension: Secondary | ICD-10-CM | POA: Diagnosis present

## 2024-02-18 DIAGNOSIS — R7989 Other specified abnormal findings of blood chemistry: Secondary | ICD-10-CM | POA: Diagnosis not present

## 2024-02-18 DIAGNOSIS — Z91030 Bee allergy status: Secondary | ICD-10-CM

## 2024-02-18 DIAGNOSIS — M19021 Primary osteoarthritis, right elbow: Secondary | ICD-10-CM | POA: Diagnosis not present

## 2024-02-18 DIAGNOSIS — M19011 Primary osteoarthritis, right shoulder: Secondary | ICD-10-CM | POA: Diagnosis not present

## 2024-02-18 DIAGNOSIS — I161 Hypertensive emergency: Secondary | ICD-10-CM | POA: Diagnosis not present

## 2024-02-18 DIAGNOSIS — Z881 Allergy status to other antibiotic agents status: Secondary | ICD-10-CM

## 2024-02-18 DIAGNOSIS — Z01810 Encounter for preprocedural cardiovascular examination: Secondary | ICD-10-CM | POA: Diagnosis not present

## 2024-02-18 DIAGNOSIS — Z7189 Other specified counseling: Secondary | ICD-10-CM | POA: Diagnosis not present

## 2024-02-18 DIAGNOSIS — Z886 Allergy status to analgesic agent status: Secondary | ICD-10-CM

## 2024-02-18 DIAGNOSIS — Y92009 Unspecified place in unspecified non-institutional (private) residence as the place of occurrence of the external cause: Secondary | ICD-10-CM

## 2024-02-18 DIAGNOSIS — M25561 Pain in right knee: Secondary | ICD-10-CM | POA: Diagnosis not present

## 2024-02-18 DIAGNOSIS — Z91041 Radiographic dye allergy status: Secondary | ICD-10-CM

## 2024-02-18 LAB — COMPREHENSIVE METABOLIC PANEL WITH GFR
ALT: 20 U/L (ref 0–44)
AST: 24 U/L (ref 15–41)
Albumin: 3.8 g/dL (ref 3.5–5.0)
Alkaline Phosphatase: 53 U/L (ref 38–126)
Anion gap: 11 (ref 5–15)
BUN: 35 mg/dL — ABNORMAL HIGH (ref 8–23)
CO2: 23 mmol/L (ref 22–32)
Calcium: 9.2 mg/dL (ref 8.9–10.3)
Chloride: 104 mmol/L (ref 98–111)
Creatinine, Ser: 1.96 mg/dL — ABNORMAL HIGH (ref 0.44–1.00)
GFR, Estimated: 25 mL/min — ABNORMAL LOW (ref >60)
Glucose, Bld: 126 mg/dL — ABNORMAL HIGH (ref 70–99)
Potassium: 4.1 mmol/L (ref 3.5–5.1)
Sodium: 138 mmol/L (ref 135–145)
Total Bilirubin: 1.2 mg/dL (ref 0.0–1.2)
Total Protein: 7.3 g/dL (ref 6.5–8.1)

## 2024-02-18 LAB — CBC WITH DIFFERENTIAL/PLATELET
Abs Immature Granulocytes: 0.02 10*3/uL (ref 0.00–0.07)
Basophils Absolute: 0 10*3/uL (ref 0.0–0.1)
Basophils Relative: 0 %
Eosinophils Absolute: 0 10*3/uL (ref 0.0–0.5)
Eosinophils Relative: 0 %
HCT: 31.7 % — ABNORMAL LOW (ref 36.0–46.0)
Hemoglobin: 10.5 g/dL — ABNORMAL LOW (ref 12.0–15.0)
Immature Granulocytes: 0 %
Lymphocytes Relative: 9 %
Lymphs Abs: 0.8 10*3/uL (ref 0.7–4.0)
MCH: 31.1 pg (ref 26.0–34.0)
MCHC: 33.1 g/dL (ref 30.0–36.0)
MCV: 93.8 fL (ref 80.0–100.0)
Monocytes Absolute: 0.5 10*3/uL (ref 0.1–1.0)
Monocytes Relative: 6 %
Neutro Abs: 8.1 10*3/uL — ABNORMAL HIGH (ref 1.7–7.7)
Neutrophils Relative %: 85 %
Platelets: 137 10*3/uL — ABNORMAL LOW (ref 150–400)
RBC: 3.38 MIL/uL — ABNORMAL LOW (ref 3.87–5.11)
RDW: 14.6 % (ref 11.5–15.5)
WBC: 9.4 10*3/uL (ref 4.0–10.5)
nRBC: 0 % (ref 0.0–0.2)

## 2024-02-18 LAB — HEMOGLOBIN A1C
Hgb A1c MFr Bld: 5 % (ref 4.8–5.6)
Mean Plasma Glucose: 96.8 mg/dL

## 2024-02-18 LAB — TSH: TSH: 2.12 u[IU]/mL (ref 0.350–4.500)

## 2024-02-18 LAB — IRON AND TIBC
Iron: 12 ug/dL — ABNORMAL LOW (ref 28–170)
Saturation Ratios: 5 % — ABNORMAL LOW (ref 10.4–31.8)
TIBC: 251 ug/dL (ref 250–450)
UIBC: 239 ug/dL

## 2024-02-18 LAB — GLUCOSE, CAPILLARY
Glucose-Capillary: 121 mg/dL — ABNORMAL HIGH (ref 70–99)
Glucose-Capillary: 125 mg/dL — ABNORMAL HIGH (ref 70–99)

## 2024-02-18 LAB — TROPONIN I (HIGH SENSITIVITY)
Troponin I (High Sensitivity): 35 ng/L — ABNORMAL HIGH (ref <18)
Troponin I (High Sensitivity): 51 ng/L — ABNORMAL HIGH (ref <18)

## 2024-02-18 LAB — MRSA NEXT GEN BY PCR, NASAL: MRSA by PCR Next Gen: NOT DETECTED

## 2024-02-18 LAB — PHOSPHORUS: Phosphorus: 2.9 mg/dL (ref 2.5–4.6)

## 2024-02-18 LAB — VITAMIN B12: Vitamin B-12: 356 pg/mL (ref 180–914)

## 2024-02-18 LAB — FERRITIN: Ferritin: 67 ng/mL (ref 11–307)

## 2024-02-18 LAB — MAGNESIUM: Magnesium: 1.7 mg/dL (ref 1.7–2.4)

## 2024-02-18 LAB — VITAMIN D 25 HYDROXY (VIT D DEFICIENCY, FRACTURES): Vit D, 25-Hydroxy: 42.43 ng/mL (ref 30–100)

## 2024-02-18 MED ORDER — METOPROLOL TARTRATE 5 MG/5ML IV SOLN
2.5000 mg | INTRAVENOUS | Status: DC | PRN
Start: 2024-02-18 — End: 2024-02-19
  Administered 2024-02-18: 2.5 mg via INTRAVENOUS
  Filled 2024-02-18: qty 5

## 2024-02-18 MED ORDER — HYDROMORPHONE HCL 1 MG/ML IJ SOLN
0.5000 mg | Freq: Four times a day (QID) | INTRAMUSCULAR | Status: DC | PRN
  Administered 2024-02-18: 0.5 mg via INTRAVENOUS
  Filled 2024-02-18: qty 0.5

## 2024-02-18 MED ORDER — ONDANSETRON HCL 4 MG/2ML IJ SOLN
4.0000 mg | Freq: Once | INTRAMUSCULAR | Status: AC
  Administered 2024-02-18: 4 mg via INTRAVENOUS
  Filled 2024-02-18: qty 2

## 2024-02-18 MED ORDER — MORPHINE SULFATE (PF) 4 MG/ML IV SOLN
2.0000 mg | Freq: Once | INTRAVENOUS | Status: AC
  Administered 2024-02-18: 2 mg via INTRAVENOUS
  Filled 2024-02-18: qty 1

## 2024-02-18 MED ORDER — LABETALOL HCL 5 MG/ML IV SOLN
10.0000 mg | INTRAVENOUS | Status: DC | PRN
  Administered 2024-02-18: 10 mg via INTRAVENOUS
  Filled 2024-02-18: qty 4

## 2024-02-18 MED ORDER — CHLORHEXIDINE GLUCONATE CLOTH 2 % EX PADS
6.0000 | MEDICATED_PAD | Freq: Every day | CUTANEOUS | Status: DC
  Administered 2024-02-18 – 2024-02-19 (×2): 6 via TOPICAL

## 2024-02-18 MED ORDER — ONDANSETRON HCL 4 MG/2ML IJ SOLN: 4.0000 mg | Freq: Four times a day (QID) | INTRAMUSCULAR | Status: DC | PRN

## 2024-02-18 MED ORDER — LABETALOL HCL 5 MG/ML IV SOLN
10.0000 mg | Freq: Four times a day (QID) | INTRAVENOUS | Status: DC | PRN
Start: 2024-02-18 — End: 2024-02-18

## 2024-02-18 MED ORDER — CLONIDINE HCL 0.2 MG/24HR TD PTWK: 0.2000 mg | MEDICATED_PATCH | TRANSDERMAL | Status: DC

## 2024-02-18 MED ORDER — SENNOSIDES-DOCUSATE SODIUM 8.6-50 MG PO TABS: 1.0000 | ORAL_TABLET | Freq: Every evening | ORAL | Status: DC | PRN

## 2024-02-18 MED ORDER — ACETAMINOPHEN 650 MG RE SUPP: 650.0000 mg | Freq: Four times a day (QID) | RECTAL | Status: DC | PRN

## 2024-02-18 MED ORDER — TRANEXAMIC ACID-NACL 1000-0.7 MG/100ML-% IV SOLN
1000.0000 mg | INTRAVENOUS | Status: DC
  Filled 2024-02-18: qty 100

## 2024-02-18 MED ORDER — ONDANSETRON HCL 4 MG PO TABS: 4.0000 mg | ORAL_TABLET | Freq: Four times a day (QID) | ORAL | Status: DC | PRN

## 2024-02-18 MED ORDER — METOPROLOL TARTRATE 5 MG/5ML IV SOLN
2.5000 mg | INTRAVENOUS | Status: DC | PRN
  Administered 2024-02-18: 2.5 mg via INTRAVENOUS
  Filled 2024-02-18: qty 5

## 2024-02-18 MED ORDER — HYDROMORPHONE HCL 1 MG/ML IJ SOLN
0.5000 mg | INTRAMUSCULAR | Status: DC | PRN
  Administered 2024-02-19: 0.5 mg via INTRAVENOUS
  Filled 2024-02-18 (×2): qty 0.5

## 2024-02-18 MED ORDER — ACETAMINOPHEN 325 MG PO TABS: 650.0000 mg | ORAL_TABLET | Freq: Four times a day (QID) | ORAL | Status: DC | PRN

## 2024-02-18 MED ORDER — CLONIDINE HCL 0.2 MG/24HR TD PTWK
0.2000 mg | MEDICATED_PATCH | TRANSDERMAL | Status: DC
  Administered 2024-02-18: 0.2 mg via TRANSDERMAL
  Filled 2024-02-18 (×2): qty 1

## 2024-02-18 MED ORDER — LACTATED RINGERS IV SOLN: INTRAVENOUS | Status: DC

## 2024-02-18 MED ORDER — METOPROLOL TARTRATE 5 MG/5ML IV SOLN: 2.5000 mg | INTRAVENOUS | Status: DC | PRN

## 2024-02-18 MED ORDER — LABETALOL HCL 5 MG/ML IV SOLN
10.0000 mg | Freq: Once | INTRAVENOUS | Status: AC
  Administered 2024-02-18: 10 mg via INTRAVENOUS
  Filled 2024-02-18: qty 4

## 2024-02-18 MED ORDER — INSULIN ASPART 100 UNIT/ML IJ SOLN: 0.0000 [IU] | INTRAMUSCULAR | Status: DC

## 2024-02-18 MED ORDER — SODIUM CHLORIDE 0.9 % IV BOLUS: 1000.0000 mL | Freq: Once | INTRAVENOUS | Status: DC

## 2024-02-18 MED ORDER — CEFAZOLIN SODIUM-DEXTROSE 2-4 GM/100ML-% IV SOLN: 2.0000 g | INTRAVENOUS | Status: DC

## 2024-02-18 MED ORDER — LACTATED RINGERS IV BOLUS
1000.0000 mL | Freq: Once | INTRAVENOUS | Status: AC
  Administered 2024-02-18: 1000 mL via INTRAVENOUS

## 2024-02-18 MED ORDER — ENOXAPARIN SODIUM 30 MG/0.3ML IJ SOSY: 30.0000 mg | PREFILLED_SYRINGE | INTRAMUSCULAR | Status: DC

## 2024-02-18 NOTE — H&P (Signed)
 History and Physical  Stephanie West ZOX:[id number] DOB: [birth year] DOA: 02/18/2024  PCP: Omie Bickers, MD   Chief Complaint: Fall  HPI: Stephanie West is a 83 y.o. female with medical history significant for mild cognitive impairment, CKD 3B, GERD, dysphagia, chronic diastolic HF and HTN who presents to the ED for evaluation of ground-level fall.  Per daughter, patient fell last night but was able to get up and was okay.  Today, she found patient morning on the sofa.  Patient informed her that she had fallen again also endorsing right hip pain so she called EMS.  Reports that patient has had decreased appetite over the last few weeks leading to decreased p.o. intake and generalized weakness. Patient lives by herself but is completely dependent on family who lives very close by for her all of her ADLs. On evaluation, patient answers "Yes" to every question which daughter states she has been doing lately.  ED Course: Initial vitals significant for temp 98.1, RR 20, HR 114, BP 187/65 and SpO2 94% on room air.  Labs significant for sodium 138, K+ 4.1, creatinine 1.96, WBC 9.4, Hgb 10.5, platelet 137, troponin 35. EKG shows SVT with a rate of 147 and LVH.  X-ray and CT of the hip shows acute impacted and nondisplaced fracture of the subcapital right femur head and neck junction.  Patient received IV morphine 2 mg x 1, IV Zofran  4 mg x 1 and started on IV LR infusion at 75 cc/h. Orthopedic surgery was consulted for evaluation. TRH was consulted for admission.  Review of Systems: Please see HPI for pertinent positives and negatives. A complete 10 system review of systems are otherwise negative.  Past Medical History:  Diagnosis Date   Asthma    Dysphagia 05/10/2017   Elevated transaminase level    GERD (gastroesophageal reflux disease)    Hypertension    MVP (mitral valve prolapse)    Past Surgical History:  Procedure Laterality Date   COLONOSCOPY     ESOPHAGEAL DILATION  07/30/2015    Procedure: ESOPHAGEAL DILATION;  Surgeon: Ruby Corporal, MD;  Location: [facility];  Service: Endoscopy;;   ESOPHAGOGASTRODUODENOSCOPY N/A 04/15/2015   Procedure: ESOPHAGOGASTRODUODENOSCOPY (EGD);  Surgeon: Ruby Corporal, MD;  Location: [facility];  Service: Endoscopy;  Laterality: N/A;  125 - moved to 1:00, Ann to notify pt   ESOPHAGOGASTRODUODENOSCOPY N/A 07/30/2015   Procedure: ESOPHAGOGASTRODUODENOSCOPY (EGD);  Surgeon: Ruby Corporal, MD;  Location: [facility];  Service: Endoscopy;  Laterality: N/A;  1040   ESOPHAGOGASTRODUODENOSCOPY (EGD) WITH ESOPHAGEAL DILATION N/A 03/07/2013   Procedure: ESOPHAGOGASTRODUODENOSCOPY (EGD) WITH ESOPHAGEAL DILATION;  Surgeon: Ruby Corporal, MD;  Location: [facility];  Service: Endoscopy;  Laterality: N/A;  830   EYE SURGERY     LAPAROSCOPIC TUBAL LIGATION  1984   UPPER GASTROINTESTINAL ENDOSCOPY     Social History:  reports that she has never smoked. She has never been exposed to tobacco smoke. She has never used smokeless tobacco. She reports that she does not drink alcohol and does not use drugs.  Allergies  Allergen Reactions   Apple Juice Other (See Comments)    Wheezing and Asthma Symptoms    Aspirin Other (See Comments)    Wheezing, Asthma Symptoms    Avelox [Moxifloxacin Hcl In Nacl] Hives    Pt can take cipro    Bee Venom Other (See Comments)    Wheezing, Asthma Symptoms   Benadryl  [Diphenhydramine  Hcl] Other (See Comments)  Wheezing, Asthma Symptoms    Chicken Allergy Other (See Comments)    Wheezing and Asthma Symptoms    Codeine Other (See Comments)    Makes patient feel like she's going to pass out.    Factive [Gemifloxacin Mesylate] Hives   Factive [Gemifloxacin] Hives   Hctz [Hydrochlorothiazide ]     hyponatremia   Iodinated Contrast Media Other (See Comments)    Pt was told by allergist to avoid  Dye due to other allergies   Iodine    Levaquin [Levofloxacin In D5w] Hives    Pt can take cipro    Levofloxacin  Hives   Penicillin V    Penicillins Other (See Comments)    Asthma Symptoms   Sulfa Antibiotics Nausea Only   Tequin [Gatifloxacin] Hives    Family History  Problem Relation Age of Onset   Hypertension Mother    Prostate cancer Father      Prior to Admission medications   Medication Sig Start Date End Date Taking? Authorizing Provider  acetaminophen  (TYLENOL ) 325 MG tablet Take 2 tablets (650 mg total) by mouth every 6 (six) hours as needed for mild pain (or Fever >/= 101). 02/11/21   Emokpae, Courage, MD  amLODipine  (NORVASC ) 5 MG tablet Take 1 tablet (5 mg total) by mouth daily. 12/01/21   Demaris Fillers, MD  cloNIDine  (CATAPRES  - DOSED IN MG/24 HR) 0.2 mg/24hr patch Place 1 patch (0.2 mg total) onto the skin once a week. Every Thursday 02/17/21   Colin Dawley, MD  donepezil (ARICEPT) 10 MG tablet Take 10 mg by mouth at bedtime. 08/16/22   [provider]  ezetimibe  (ZETIA ) 10 MG tablet Take 10 mg by mouth daily.    [provider]  feeding supplement (ENSURE ENLIVE / ENSURE PLUS) LIQD Take 237 mLs by mouth 3 (three) times daily between meals. Patient taking differently: Take 237 mLs by mouth every other day. 02/11/21   Colin Dawley, MD  guanFACINE  (INTUNIV ) 1 MG TB24 ER tablet Take 1 mg by mouth daily. 04/01/20   [provider]  hydrALAZINE  (APRESOLINE ) 50 MG tablet TAKE 1 TABLET THREE TIMES A DAY 08/29/23   Laurann Pollock, MD  levocetirizine (XYZAL ) 5 MG tablet Take 5 mg by mouth at bedtime. 10/14/21   [provider]  loteprednol  (LOTEMAX ) 0.5 % ophthalmic suspension 1 drop daily. 03/17/22   [provider]  megestrol  (MEGACE ) 40 MG tablet Take 40 mg by mouth 2 (two) times daily. 04/11/22   [provider]  olmesartan (BENICAR) 40 MG tablet Take 40 mg by mouth daily.    [provider]  omeprazole  (PRILOSEC) 40 MG capsule TAKE 1 CAPSULE(40 MG) BY MOUTH DAILY 04/20/22   Carlan, Chelsea L, NP  polyethylene glycol powder  (GLYCOLAX /MIRALAX ) 17 GM/SCOOP powder Take 1 Container by mouth as needed for mild constipation.    [provider]  potassium chloride  (KLOR-CON ) 10 MEQ tablet Take 10 mEq by mouth 3 (three) times daily. 02/27/22   [provider]  sertraline  (ZOLOFT ) 25 MG tablet Take 25 mg by mouth daily. 08/16/22   [provider]  sucralfate  (CARAFATE ) 1 g tablet Take 1 g by mouth 4 (four) times daily -  with meals and at bedtime.    [provider]    Physical Exam: BP (!) 219/125   Pulse (!) 130   Temp 98.1 F (36.7 C) (Oral)   Resp 20   Ht 5\' 3"  (1.6 m)   Wt 55 kg   SpO2 95%  BMI 21.48 kg/m  General: Chronically ill and deconditioned elderly woman laying in bed. No acute distress. HEENT: [place]/AT. Anicteric sclera. Dry mucous membrane CV: Tachycardic.  Regular rhythm. No murmurs, rubs, or gallops. No LE edema Pulmonary: Lungs CTAB. Normal effort. No wheezing or rales. Abdominal: Soft, nontender, nondistended. Normal bowel sounds. MSK: Mild ttp of the right hip. Right arm flexed at the elbow and limited ROM. Palpable DP and PT pulses. Skin: Warm and dry. No obvious rash or lesions. Decreased skin turgor. Neuro: Awake and alert. Answers questions with "Yes". Normal sensation to light touch.  Psych: Normal mood and affect          Labs on Admission:  Basic Metabolic Panel: Recent Labs  Lab 02/18/24 1511  NA 138  K 4.1  CL 104  CO2 23  GLUCOSE 126*  BUN 35*  CREATININE 1.96*  CALCIUM 9.2   Liver Function Tests: Recent Labs  Lab 02/18/24 1511  AST 24  ALT 20  ALKPHOS 53  BILITOT 1.2  PROT 7.3  ALBUMIN 3.8   No results for input(s): "LIPASE", "AMYLASE" in the last 168 hours. No results for input(s): "AMMONIA" in the last 168 hours. CBC: Recent Labs  Lab 02/18/24 1511  WBC 9.4  NEUTROABS 8.1*  HGB 10.5*  HCT 31.7*  MCV 93.8  PLT 137*   Cardiac Enzymes: No results for input(s): "CKTOTAL", "CKMB", "CKMBINDEX", "TROPONINI" in the last  168 hours. BNP (last 3 results) No results for input(s): "BNP" in the last 8760 hours.  ProBNP (last 3 results) No results for input(s): "PROBNP" in the last 8760 hours.  CBG: No results for input(s): "GLUCAP" in the last 168 hours.  Radiological Exams on Admission: CT Hip Right Wo Contrast Result Date: 02/18/2024 CLINICAL DATA:  Right hip pain after fall. EXAM: CT OF THE RIGHT HIP WITHOUT CONTRAST TECHNIQUE: Multidetector CT imaging of the right hip was performed according to the standard protocol. Multiplanar CT image reconstructions were also generated. RADIATION DOSE REDUCTION: This exam was performed according to the departmental dose-optimization program which includes automated exposure control, adjustment of the mA and/or kV according to patient size and/or use of iterative reconstruction technique. COMPARISON:  Right hip radiographs dated 02/18/2024 at 2:31 p.m. FINDINGS: Bones/Joint/Cartilage Acute impacted subcapital fracture of the right femoral head and neck junction. There is a approximately 6 mm of cortical offset at the posterior femoral head and neck junction. The right femoral head is seated within the acetabulum. Mild degenerative changes of the right hip. The right sacroiliac joint and pubic symphysis are intact with degenerative changes. Ligaments Ligaments are suboptimally evaluated by CT. Soft tissue/muscles Soft tissue swelling along the right posterolateral hip and buttocks. No loculated collection. Atherosclerotic vascular calcifications. IMPRESSION: Acute impacted subcapital fracture of the right femoral head and neck junction. There is a approximately 6 mm of cortical offset at the posterior femoral head and neck junction. Electronically Signed   By: Mannie Seek M.D.   On: 02/18/2024 16:05   DG Chest 1 View Result Date: 02/18/2024 CLINICAL DATA:  Fall. EXAM: CHEST  1 VIEW COMPARISON:  10/10/2023. FINDINGS: Bilateral lungs appear hyperlucent with coarse bronchovascular  markings, in keeping with COPD. Bilateral lungs otherwise appear clear. No dense consolidation or lung collapse. Bilateral costophrenic angles are clear. Stable cardio-mediastinal silhouette. Small retrocardiac hiatal hernia noted. No acute osseous abnormalities. The soft tissues are within normal limits. IMPRESSION: No active disease. Electronically Signed   By: Beula Brunswick M.D.   On: 02/18/2024 15:14  DG Hip Unilat W or Wo Pelvis 2-3 Views Right Result Date: 02/18/2024 CLINICAL DATA:  fall diffuse pain. EXAM: DG HIP (WITH OR WITHOUT PELVIS) 2-3V RIGHT COMPARISON:  None Available. FINDINGS: There is concern for acute impacted and undisplaced fracture of the subcapital right femur neck. Correlate clinically. Consider further imaging with CT scan, as clinically indicated. No other acute fracture or dislocation. No aggressive osseous lesion. Visualized sacral arcuate lines are unremarkable. Unremarkable symphysis pubis. There are mild degenerative changes of bilateral hip joints without significant joint space narrowing. Osteophytosis of the superior acetabulum. No radiopaque foreign bodies. IMPRESSION: *There is concern for acute impacted and undisplaced fracture of the subcapital right femur neck. Consider further imaging with CT scan, as clinically indicated. Electronically Signed   By: Beula Brunswick M.D.   On: 02/18/2024 15:04   DG Knee Complete 4 Views Right Result Date: 02/18/2024 CLINICAL DATA:  Pain after fall EXAM: RIGHT KNEE - COMPLETE 4 VIEW COMPARISON:  None Available. FINDINGS: Osteopenia. No acute fracture or dislocation. Mild joint space loss of the lateral compartment with some small osteophytes and some sclerosis. Trace joint effusion on lateral view. IMPRESSION: Osteopenia. Mild degenerative changes of the lateral compartment. Trace joint fluid Electronically Signed   By: Adrianna Horde M.D.   On: 02/18/2024 15:01   Assessment/Plan Stephanie West is a 83 y.o. female with medical history  significant for mild cognitive impairment, CKD 3B, GERD, dysphagia, chronic diastolic HF and HTN who presents to the ED for evaluation of ground-level fall found to have right femur fracture.  # Fall # Right femur fracture - Secondary to ground-level fall in a very deconditioned elderly woman - Has tenderness to the right hip hip, family noticed patient holding right arm - Hip x-ray and CT shows acute impacted subcapital fracture of the right femoral head and neck junction. - Orthopedic surgery consulted, appreciate recs - Pain control with PRN IV Dilaudid - PT/OT eval pending Ortho evaluation and plan - Follow-up vitamin D levels - F/u x-ray of the right arm and elbow - Fall precautions  # SVT # Tachycardia - In the setting of acute pain and dehydration - HR elevated to the 120s to 140s, EKG showing SVT - HR improved to the 90s to 110s s/p IV metoprolol  2.5 mg x 1 - Continue IV Metoprolol  2.5 mg PRN for HR > 120 - Check mag, TSH and Phos - IV hydration and pain control  # Hypertensive urgency # Severe hypertension - SBP in the 180s to 220s since admission-likely secondary to right hip pain - Per daughter, patient has clonidine  patch that has fallen off - SBP improved to the 160s to 170s status post IV labetalol 10 mg x 1. - Resume home clonidine  patch - IV labetalol 10 mg q4h PRN for SBP > 180 - Resume home p.o. meds after evaluation by SLP  # CKD 3B - Creatinine of 1.96 on admission - Unclear baseline creatinine as last creatinine was 2.05 in December 2024 - Give IV LR 1 L bolus followed by IV LR infusion at 75 cc/h - Avoid nephrotoxic agents - Trend renal function  # Elevated troponin - Troponin elevated to 35->51 - EKG without ischemic changes - Likely demand ischemia in the setting of acute illness/pain - Trend troponin until peak - Telemetry  # Normocytic anemia - Hgb of 10.5, with baseline around 11-12. - No evidence of active bleed - Check iron studies,  ferritin and vitamin B12 - Trend CBC  # Chronic  diastolic HF - Last TTE in 2/23 showed EF 60-65%, G1DD, but no valvular abnormalities. - Patient dry on exam - CTM  # Dysphagia - EGD in 2016 showed high-grade Schatzki's ring which was disrupted with combination of balloon dilation and focal biopsy - Patient has had trouble with oral intake per daughter - Keep n.p.o., SLP consulted for evaluation - Aspiration precautions  # Mild cognitive impairment - Resume Aricept when able to take p.o.  # GERD - Resume PPI when able to take p.o.   DVT prophylaxis: Lovenox      Code Status: Limited: Do not attempt resuscitation (DNR) -DNR-LIMITED -Do Not Intubate/DNI   Consults called: Orthopedic surgery  Family Communication: Discussed admission with daughter at bedside  Severity of Illness: The appropriate patient status for this patient is INPATIENT. Inpatient status is judged to be reasonable and necessary in order to provide the required intensity of service to ensure the patient's safety. The patient's presenting symptoms, physical exam findings, and initial radiographic and laboratory data in the context of their chronic comorbidities is felt to place them at high risk for further clinical deterioration. Furthermore, it is not anticipated that the patient will be medically stable for discharge from the hospital within 2 midnights of admission.   * I certify that at the point of admission it is my clinical judgment that the patient will require inpatient hospital care spanning beyond 2 midnights from the point of admission due to high intensity of service, high risk for further deterioration and high frequency of surveillance required.*  Level of care: Stepdown   This record has been created using Conservation officer, historic buildings. Errors have been sought and corrected, but may not always be located. Such creation errors do not reflect on the standard of care.   Vita Grip,  MD 02/18/2024, 4:46 PM [facility] Pager: [phone] Isaiah 41:10   If 7PM-7AM, please contact night-coverage [email or web address] Password [email or web address]

## 2024-02-18 NOTE — ED Provider Notes (Signed)
 [place] EMERGENCY DEPARTMENT AT [facility] Provider Note   CSN: [id number] Arrival date & time: 02/18/24  1345     History  Chief Complaint  Patient presents with   Stephanie West is a 83 y.o. female.  Patient is an 83 year old female who presents to the emergency department with her family secondary to ground-level fall.  Patient notes that she is experiencing pain to her right hip at this time.  Patient notes that she did have a fall yesterday which resulted in no injuries.  Patient did not strike her head during the fall and denies any active pain to her neck or back.  She notes that the fall was mechanical in nature with no preceding pain.  She has had no chest pain, shortness of breath, palpitations, abdominal pain, nausea, vomiting, diarrhea.  She denies any known history of bleeding disorders or current anticoagulation use.   Fall       Home Medications Prior to Admission medications   Medication Sig Start Date End Date Taking? Authorizing Provider  acetaminophen  (TYLENOL ) 325 MG tablet Take 2 tablets (650 mg total) by mouth every 6 (six) hours as needed for mild pain (or Fever >/= 101). 02/11/21   Emokpae, Courage, MD  amLODipine  (NORVASC ) 5 MG tablet Take 1 tablet (5 mg total) by mouth daily. 12/01/21   Demaris Fillers, MD  cloNIDine  (CATAPRES  - DOSED IN MG/24 HR) 0.2 mg/24hr patch Place 1 patch (0.2 mg total) onto the skin once a week. Every Thursday 02/17/21   Colin Dawley, MD  donepezil (ARICEPT) 10 MG tablet Take 10 mg by mouth at bedtime. 08/16/22   [provider]  ezetimibe  (ZETIA ) 10 MG tablet Take 10 mg by mouth daily.    [provider]  feeding supplement (ENSURE ENLIVE / ENSURE PLUS) LIQD Take 237 mLs by mouth 3 (three) times daily between meals. Patient taking differently: Take 237 mLs by mouth every other day. 02/11/21   Colin Dawley, MD  guanFACINE  (INTUNIV ) 1 MG TB24 ER tablet Take 1 mg by mouth daily. 04/01/20    [provider]  hydrALAZINE  (APRESOLINE ) 50 MG tablet TAKE 1 TABLET THREE TIMES A DAY 08/29/23   Laurann Pollock, MD  levocetirizine (XYZAL ) 5 MG tablet Take 5 mg by mouth at bedtime. 10/14/21   [provider]  loteprednol  (LOTEMAX ) 0.5 % ophthalmic suspension 1 drop daily. 03/17/22   [provider]  megestrol  (MEGACE ) 40 MG tablet Take 40 mg by mouth 2 (two) times daily. 04/11/22   [provider]  olmesartan (BENICAR) 40 MG tablet Take 40 mg by mouth daily.    [provider]  omeprazole  (PRILOSEC) 40 MG capsule TAKE 1 CAPSULE(40 MG) BY MOUTH DAILY 04/20/22   Carlan, Chelsea L, NP  polyethylene glycol powder (GLYCOLAX /MIRALAX ) 17 GM/SCOOP powder Take 1 Container by mouth as needed for mild constipation.    [provider]  potassium chloride  (KLOR-CON ) 10 MEQ tablet Take 10 mEq by mouth 3 (three) times daily. 02/27/22   [provider]  sertraline  (ZOLOFT ) 25 MG tablet Take 25 mg by mouth daily. 08/16/22   [provider]  sucralfate  (CARAFATE ) 1 g tablet Take 1 g by mouth 4 (four) times daily -  with meals and at bedtime.    [provider]      Allergies    Apple juice, Aspirin, Avelox [moxifloxacin hcl in nacl], Bee venom, Benadryl  [diphenhydramine  hcl], Chicken allergy, Codeine, Factive [gemifloxacin mesylate], Factive [  gemifloxacin], Hctz [hydrochlorothiazide ], Iodinated contrast media, Iodine, Levaquin [levofloxacin in d5w], Levofloxacin, Penicillin v, Penicillins, Sulfa antibiotics, and Tequin [gatifloxacin]    Review of Systems   Review of Systems  Musculoskeletal:        Right hip pain  All other systems reviewed and are negative.   Physical Exam Updated Vital Signs BP (!) 187/65   Pulse (!) 114   Temp 98.1 F (36.7 C) (Oral)   Resp 20   Ht 5\' 3"  (1.6 m)   Wt 55 kg   SpO2 94%   BMI 21.48 kg/m  Physical Exam Vitals and nursing note reviewed.  Constitutional:      Appearance: Normal  appearance.  HENT:     Head: Normocephalic and atraumatic.     Nose: Nose normal.     Mouth/Throat:     Mouth: Mucous membranes are moist.  Eyes:     Extraocular Movements: Extraocular movements intact.     Conjunctiva/sclera: Conjunctivae normal.     Pupils: Pupils are equal, round, and reactive to light.  Cardiovascular:     Rate and Rhythm: Regular rhythm. Tachycardia present.     Pulses: Normal pulses.     Heart sounds: Normal heart sounds. No murmur heard.    No gallop.  Pulmonary:     Effort: Pulmonary effort is normal. No respiratory distress.     Breath sounds: Normal breath sounds. No stridor. No wheezing, rhonchi or rales.  Abdominal:     General: Abdomen is flat. Bowel sounds are normal. There is no distension.     Palpations: Abdomen is soft. There is no mass.     Tenderness: There is no abdominal tenderness. There is no guarding.  Musculoskeletal:        General: Normal range of motion.     Cervical back: Normal range of motion and neck supple. No rigidity or tenderness.     Right lower leg: No edema.     Left lower leg: No edema.     Comments: Tenderness palpation noted over the right hip, nontender palpation over left lower extremity throughout, tender to palpation over the right knee, nontender palpation over right ankle or foot, DP and PT pulses are 2+ distally bilateral lower extremities, sensation intact distally, pelvis stable to AP lateral compression, no lacerations or abrasions, nontender palpation of the bilateral extremities diffusely, radial pulse 2+ upper extremities, sensation intact distally, full range of motion noted to bilateral upper extremities, limited active and passive range of motion at right hip secondary to pain  Skin:    General: Skin is warm and dry.     Findings: No bruising or rash.  Neurological:     General: No focal deficit present.     Mental Status: She is alert and oriented to person, place, and time. Mental status is at baseline.      Cranial Nerves: No cranial nerve deficit.     Sensory: No sensory deficit.     Motor: No weakness.     Coordination: Coordination normal.  Psychiatric:        Mood and Affect: Mood normal.        Behavior: Behavior normal.        Thought Content: Thought content normal.        Judgment: Judgment normal.     ED Results / Procedures / Treatments   Labs (all labs ordered are listed, but only abnormal results are displayed) Labs Reviewed  COMPREHENSIVE METABOLIC PANEL WITH GFR - Abnormal; Notable for  the following components:      Result Value   Glucose, Bld 126 (*)    BUN 35 (*)    Creatinine, Ser 1.96 (*)    GFR, Estimated 25 (*)    All other components within normal limits  CBC WITH DIFFERENTIAL/PLATELET - Abnormal; Notable for the following components:   RBC 3.38 (*)    Hemoglobin 10.5 (*)    HCT 31.7 (*)    Platelets 137 (*)    Neutro Abs 8.1 (*)    All other components within normal limits  TROPONIN I (HIGH SENSITIVITY) - Abnormal; Notable for the following components:   Troponin I (High Sensitivity) 35 (*)    All other components within normal limits  URINALYSIS, ROUTINE W REFLEX MICROSCOPIC  TROPONIN I (HIGH SENSITIVITY)    EKG EKG Interpretation Date/Time:  Monday February 18 2024 14:21:26 EDT Ventricular Rate:  116 PR Interval:    QRS Duration:  128 QT Interval:  381 QTC Calculation: 527 R Axis:   73  Text Interpretation: Sinus tachycardia Artifact Poor data quality Confirmed by Dorenda Gandy [phone]) on 02/18/2024 2:26:50 PM  Radiology CT Hip Right Wo Contrast Result Date: 02/18/2024 CLINICAL DATA:  Right hip pain after fall. EXAM: CT OF THE RIGHT HIP WITHOUT CONTRAST TECHNIQUE: Multidetector CT imaging of the right hip was performed according to the standard protocol. Multiplanar CT image reconstructions were also generated. RADIATION DOSE REDUCTION: This exam was performed according to the departmental dose-optimization program which includes automated  exposure control, adjustment of the mA and/or kV according to patient size and/or use of iterative reconstruction technique. COMPARISON:  Right hip radiographs dated 02/18/2024 at 2:31 p.m. FINDINGS: Bones/Joint/Cartilage Acute impacted subcapital fracture of the right femoral head and neck junction. There is a approximately 6 mm of cortical offset at the posterior femoral head and neck junction. The right femoral head is seated within the acetabulum. Mild degenerative changes of the right hip. The right sacroiliac joint and pubic symphysis are intact with degenerative changes. Ligaments Ligaments are suboptimally evaluated by CT. Soft tissue/muscles Soft tissue swelling along the right posterolateral hip and buttocks. No loculated collection. Atherosclerotic vascular calcifications. IMPRESSION: Acute impacted subcapital fracture of the right femoral head and neck junction. There is a approximately 6 mm of cortical offset at the posterior femoral head and neck junction. Electronically Signed   By: Mannie Seek M.D.   On: 02/18/2024 16:05   DG Chest 1 View Result Date: 02/18/2024 CLINICAL DATA:  Fall. EXAM: CHEST  1 VIEW COMPARISON:  10/10/2023. FINDINGS: Bilateral lungs appear hyperlucent with coarse bronchovascular markings, in keeping with COPD. Bilateral lungs otherwise appear clear. No dense consolidation or lung collapse. Bilateral costophrenic angles are clear. Stable cardio-mediastinal silhouette. Small retrocardiac hiatal hernia noted. No acute osseous abnormalities. The soft tissues are within normal limits. IMPRESSION: No active disease. Electronically Signed   By: Beula Brunswick M.D.   On: 02/18/2024 15:14   DG Hip Unilat W or Wo Pelvis 2-3 Views Right Result Date: 02/18/2024 CLINICAL DATA:  fall diffuse pain. EXAM: DG HIP (WITH OR WITHOUT PELVIS) 2-3V RIGHT COMPARISON:  None Available. FINDINGS: There is concern for acute impacted and undisplaced fracture of the subcapital right femur neck.  Correlate clinically. Consider further imaging with CT scan, as clinically indicated. No other acute fracture or dislocation. No aggressive osseous lesion. Visualized sacral arcuate lines are unremarkable. Unremarkable symphysis pubis. There are mild degenerative changes of bilateral hip joints without significant joint space narrowing. Osteophytosis of the superior acetabulum.  No radiopaque foreign bodies. IMPRESSION: *There is concern for acute impacted and undisplaced fracture of the subcapital right femur neck. Consider further imaging with CT scan, as clinically indicated. Electronically Signed   By: Beula Brunswick M.D.   On: 02/18/2024 15:04   DG Knee Complete 4 Views Right Result Date: 02/18/2024 CLINICAL DATA:  Pain after fall EXAM: RIGHT KNEE - COMPLETE 4 VIEW COMPARISON:  None Available. FINDINGS: Osteopenia. No acute fracture or dislocation. Mild joint space loss of the lateral compartment with some small osteophytes and some sclerosis. Trace joint effusion on lateral view. IMPRESSION: Osteopenia. Mild degenerative changes of the lateral compartment. Trace joint fluid Electronically Signed   By: Adrianna Horde M.D.   On: 02/18/2024 15:01    Procedures Procedures    Medications Ordered in ED Medications  lactated ringers  infusion ( Intravenous New Bag/Given 02/18/24 1545)  labetalol  (NORMODYNE ) injection 10 mg (has no administration in time range)  morphine  (PF) 4 MG/ML injection 2 mg (2 mg Intravenous Given 02/18/24 1548)  ondansetron  (ZOFRAN ) injection 4 mg (4 mg Intravenous Given 02/18/24 1548)    ED Course/ Medical Decision Making/ A&P Clinical Course as of 02/18/24 1619  Mon Feb 18, 2024  1551 Sinus tachycardia, rate of 116, normal PR/QRS interval, no ST/T wave changes, no ischemic changes, no STEMI, normal axis [CR]    Clinical Course User Index [CR] Roselynn Connors, PA-C                                 Medical Decision Making Amount and/or Complexity of Data  Reviewed Labs: ordered. Radiology: ordered.  Risk Prescription drug management. Decision regarding hospitalization.   This patient presents to the ED for concern of right hip pain, this involves an extensive number of treatment options, and is a complaint that carries with it a high risk of complications and morbidity.  The differential diagnosis includes fracture, sprain, contusion   Co morbidities that complicate the patient evaluation  Dementia   Additional history obtained:  Additional history obtained from family External records from outside source obtained and reviewed including medical records   Lab Tests:  I Ordered, and personally interpreted labs.  The pertinent results include: No leukocytosis, anemia noted, elevated creatinine but at baseline, normal electrolytes and liver function, elevated troponin   Imaging Studies ordered:  I ordered imaging studies including x-ray of right hip, knee, chest, CT scan of right hip I independently visualized and interpreted imaging which showed right subcapital femoral neck fracture, no acute cardiopulmonary process, no fracture of the right knee I agree with the radiologist interpretation   Cardiac Monitoring: / EKG:  The patient was maintained on a cardiac monitor.  I personally viewed and interpreted the cardiac monitored which showed an underlying rhythm of: Sinus tachycardia, no ST/T wave changes, no ischemic changes, no STEMI   Consultations Obtained:  I requested consultation with the orthopedics, Dr. Ernesta Heading,  and discussed lab and imaging findings as well as pertinent plan - they recommend: Admission for the OR   Problem List / ED Course / Critical interventions / Medication management  Patient case has been discussed with the orthopedic surgeon on-call Dr. Ernesta Heading regarding the right hip fracture.  He will see the patient in consult.  Will plan for admission to the hospitalist service.  Lab work does demonstrate a  worsening anemia compared to her labs 6 months ago.  Patient does have stable creatinine at this  time.  Troponin is mildly elevated though patient denies any chest pain or shortness of breath.  Chest x-ray demonstrates no acute cardiopulmonary process.  No ischemic changes are noted on EKG.  On reevaluation patient's tachycardia has worsened and she is hypertensive.  Will add on a antihypertensive meds and further rate control.  Patient had no tenderness to palpation in her cervical, thoracic, lumbar spine.  She did not strike her head during the fall.  Do not suspect any further advanced imaging is warranted at this time.  Did discuss patient case with Dr. Yvonne Hering the hospital service who has excepted at this time. I ordered medication including morphine , Zofran , IV fluids, metoprolol  for tachycardia, hip fracture, hypertension Reevaluation of the patient after these medicines showed that the patient improved I have reviewed the patients home medicines and have made adjustments as needed   Social Determinants of Health:  None   Test / Admission - Considered:  Admission        Final Clinical Impression(s) / ED Diagnoses Final diagnoses:  None    Rx / DC Orders ED Discharge Orders     None         Roselynn Connors, PA-C 02/18/24 1633    Merdis Stalling, MD 02/21/24 1526

## 2024-02-18 NOTE — ED Triage Notes (Signed)
 Pt states she fell yesterday at home and then again today in the kitchen. Pt states she did not hit her head. C/o pain in groin area, right hip. Hx of HTN.

## 2024-02-19 ENCOUNTER — Inpatient Hospital Stay (HOSPITAL_COMMUNITY)

## 2024-02-19 ENCOUNTER — Encounter (HOSPITAL_COMMUNITY): Admission: EM | Disposition: E | Payer: Self-pay | Source: Home / Self Care | Attending: Internal Medicine

## 2024-02-19 ENCOUNTER — Encounter (HOSPITAL_COMMUNITY): Payer: Self-pay | Admitting: Certified Registered Nurse Anesthetist

## 2024-02-19 ENCOUNTER — Encounter (HOSPITAL_COMMUNITY): Payer: Self-pay | Admitting: Student

## 2024-02-19 DIAGNOSIS — N184 Chronic kidney disease, stage 4 (severe): Secondary | ICD-10-CM

## 2024-02-19 DIAGNOSIS — Z7189 Other specified counseling: Secondary | ICD-10-CM

## 2024-02-19 DIAGNOSIS — Z01810 Encounter for preprocedural cardiovascular examination: Secondary | ICD-10-CM

## 2024-02-19 DIAGNOSIS — I471 Supraventricular tachycardia, unspecified: Secondary | ICD-10-CM

## 2024-02-19 DIAGNOSIS — S72001A Fracture of unspecified part of neck of right femur, initial encounter for closed fracture: Secondary | ICD-10-CM

## 2024-02-19 DIAGNOSIS — I10 Essential (primary) hypertension: Secondary | ICD-10-CM

## 2024-02-19 DIAGNOSIS — Z515 Encounter for palliative care: Secondary | ICD-10-CM

## 2024-02-19 DIAGNOSIS — S72011A Unspecified intracapsular fracture of right femur, initial encounter for closed fracture: Secondary | ICD-10-CM | POA: Diagnosis not present

## 2024-02-19 LAB — BASIC METABOLIC PANEL WITH GFR
Anion gap: 8 (ref 5–15)
BUN: 32 mg/dL — ABNORMAL HIGH (ref 8–23)
CO2: 24 mmol/L (ref 22–32)
Calcium: 8.3 mg/dL — ABNORMAL LOW (ref 8.9–10.3)
Chloride: 107 mmol/L (ref 98–111)
Creatinine, Ser: 1.68 mg/dL — ABNORMAL HIGH (ref 0.44–1.00)
GFR, Estimated: 30 mL/min — ABNORMAL LOW (ref >60)
Glucose, Bld: 113 mg/dL — ABNORMAL HIGH (ref 70–99)
Potassium: 4.2 mmol/L (ref 3.5–5.1)
Sodium: 139 mmol/L (ref 135–145)

## 2024-02-19 LAB — PHOSPHORUS: Phosphorus: 3.8 mg/dL (ref 2.5–4.6)

## 2024-02-19 LAB — TROPONIN I (HIGH SENSITIVITY)
Troponin I (High Sensitivity): 879 ng/L (ref <18)
Troponin I (High Sensitivity): 925 ng/L (ref <18)

## 2024-02-19 LAB — CBC
HCT: 26.8 % — ABNORMAL LOW (ref 36.0–46.0)
Hemoglobin: 8.5 g/dL — ABNORMAL LOW (ref 12.0–15.0)
MCH: 31.1 pg (ref 26.0–34.0)
MCHC: 31.7 g/dL (ref 30.0–36.0)
MCV: 98.2 fL (ref 80.0–100.0)
Platelets: 96 10*3/uL — ABNORMAL LOW (ref 150–400)
RBC: 2.73 MIL/uL — ABNORMAL LOW (ref 3.87–5.11)
RDW: 14.8 % (ref 11.5–15.5)
WBC: 6.6 10*3/uL (ref 4.0–10.5)
nRBC: 0 % (ref 0.0–0.2)

## 2024-02-19 LAB — MAGNESIUM: Magnesium: 1.7 mg/dL (ref 1.7–2.4)

## 2024-02-19 LAB — GLUCOSE, CAPILLARY
Glucose-Capillary: 91 mg/dL (ref 70–99)
Glucose-Capillary: 83 mg/dL (ref 70–99)

## 2024-02-19 SURGERY — HEMIARTHROPLASTY (BIPOLAR) HIP, POSTERIOR APPROACH FOR FRACTURE
Anesthesia: Choice | Site: Hip | Laterality: Right

## 2024-02-19 MED ORDER — GLYCOPYRROLATE 0.2 MG/ML IJ SOLN: 0.2000 mg | INTRAMUSCULAR | Status: DC | PRN

## 2024-02-19 MED ORDER — LORAZEPAM 2 MG/ML IJ SOLN: 1.0000 mg | INTRAMUSCULAR | Status: DC | PRN

## 2024-02-19 MED ORDER — ACETAMINOPHEN 325 MG PO TABS: 650.0000 mg | ORAL_TABLET | Freq: Four times a day (QID) | ORAL | Status: DC | PRN

## 2024-02-19 MED ORDER — METOPROLOL TARTRATE 25 MG PO TABS
25.0000 mg | ORAL_TABLET | Freq: Two times a day (BID) | ORAL | Status: DC
Start: 2024-02-19 — End: 2024-02-19

## 2024-02-19 MED ORDER — HYDROMORPHONE HCL 1 MG/ML IJ SOLN
0.5000 mg | INTRAMUSCULAR | Status: DC | PRN
  Administered 2024-02-19: 1 mg via INTRAVENOUS
  Filled 2024-02-19: qty 1

## 2024-02-19 MED ORDER — ACETAMINOPHEN 650 MG RE SUPP: 650.0000 mg | Freq: Four times a day (QID) | RECTAL | Status: DC | PRN

## 2024-02-19 MED ORDER — BIOTENE DRY MOUTH MT LIQD: 15.0000 mL | OROMUCOSAL | Status: DC | PRN

## 2024-02-19 MED ORDER — POLYVINYL ALCOHOL 1.4 % OP SOLN: 1.0000 [drp] | Freq: Four times a day (QID) | OPHTHALMIC | Status: DC | PRN

## 2024-02-19 MED ORDER — ONDANSETRON 4 MG PO TBDP: 4.0000 mg | ORAL_TABLET | Freq: Four times a day (QID) | ORAL | Status: DC | PRN

## 2024-02-19 MED ORDER — ONDANSETRON HCL 4 MG/2ML IJ SOLN: 4.0000 mg | Freq: Four times a day (QID) | INTRAMUSCULAR | Status: DC | PRN

## 2024-02-19 MED ORDER — GLYCOPYRROLATE 1 MG PO TABS: 1.0000 mg | ORAL_TABLET | ORAL | Status: DC | PRN

## 2024-02-20 DIAGNOSIS — I5033 Acute on chronic diastolic (congestive) heart failure: Secondary | ICD-10-CM

## 2024-02-21 NOTE — Progress Notes (Signed)
 Progress Note   Patient: Stephanie West:[id number] DOB: 22-Dec-1940 DOA: 02/18/2024     1 DOS: the patient was seen and examined on 02/15/2024   Brief hospital course: Stephanie West was admitted to the hospital with the working diagnosis of right hip femoral neck fracture.   83 yo female with the past medical history of CKD, GERD, dysphagia hypertension and heart failure who presented after a mechanical fall. Patient fell at home twice, she was found by her daughter in the sofa with right hip pain. Because of persistent hip pain EMS was called.  In the ED her blood pressure was 187/65, HR 114, RR 20 and 02 saturation 94% on room air. Lungs with no wheezing or rhonchi, heart with S1 and S2 present and tachycardic, abdomen with no distention and no lower extremity edema.   Na 138, K 4.1 Cl 104 bicarbonate 23, glucose 126 bun 35 cr 1,36  AST 24 West 20  High sensitive troponin 35, 51, 879 Wbc 9,4 hgb 10.5 plt 137   Chest radiograph with right rotation with hyperinflation with no effusions or infiltrates.   EKG 116 bpm, normal axis, normal intervals, atrial flutter/ SVT, with no significant ST segment or T wave changes.   Hip CT with acute impacted subcapital fracture of the right femoral head and neck junction. There is a approximately 6 mm of cortical offset at the posterior femoral head and neck junction     Assessment and Plan: * Subcapital fracture of femur, right, closed, initial encounter Stephanie West) Patient not in acute pain, her health has been declining for the last 5 years, her dementia is progressing to the point where she needs assistance with her daily activities.  Poor quality of life Decision has made by her family to continue care with conservative measures, no invasive procedures.  Considering her advanced dementia this is reasonable option.  I have talked to Dr Stephanie West and Dr Stephanie West, and plan to consult palliative care.   Hypertension Uncontrolled hypertension with  hypertensive emergency on admission. Continue blood pressure monitoring. Continue clonidine  patch.   SVT (supraventricular tachycardia) (HCC) Telemetry with sinus rhythm at 80 bpm.  Plan to continue close monitoring.  Will add oral metoprolol  for rate control.  Elevated troponin West to tachycardia, at this point ruled out acute coronary syndrome.    CKD (chronic kidney disease) stage 4, GFR 15-29 ml/min (HCC) Renal function with serum cr at 1,68 with K at 4,2 and serum bicarbonate at 24 Na 139  Mg 1,7   Normocytic anemia Iron deficiency anemia with serum iron 12, TIBC 251, transferrin saturation 5 and ferritin 67  Hgb is 8,5  No current indication for PRBC transfusion.       Subjective: Patient very deconditioned, minimally verbal only yes or no, eyes closed, not apparent pain or dyspnea,   Physical Exam: Vitals:   01/30/2024 0700 02/15/2024 0720 02/07/2024 0746 01/23/2024 0900  BP: (!) 163/51   (!) 168/68  Pulse: 79  82 85  Resp: 13  13 14   Temp:  98.3 F (36.8 C)    TempSrc:  Axillary    SpO2: 95%  94% 96%  Weight:      Height:       Neurology deconditioned and ill looking appearing, not very interactive  ENT with mild pallor with no icterus Cardiovascular with S1 and S2 present and irregular with no gallops, rubs positive systolic murmur at the apex No JVD Respiratory with poor inspiratory effort with scattered rhonchi with no  rales or wheezing, Abdomen with no distention   Data Reviewed:   Family Communication: I spoke with patient's daughter at the bedside, we talked in detail about patient's condition, plan of care and prognosis and all questions were addressed.   Disposition: Status is: Inpatient Remains inpatient appropriate because: comfort care   Planned Discharge Destination: Home     Author: Albertus Alt, MD 02/16/2024 10:20 AM  For on call review [email or web address].

## 2024-02-21 NOTE — Significant Event (Signed)
 Significant Event:   Notified of critical hs troponin 871, up from 51. previously had SVT and ischemic change with diffuse ST depressions. Suspect this is demand event in setting of her SVT and severe range hypertension. BP now much better controlled. Will repeat EKG, repeat troponin. Considering upcoming plan for OR and acute myocardial injury, will reach out to cardiology for consultation this morning. Hold off on aspirin / heparin  due to suspected demand event. If becomes more symptomatic or troponin does not plateau would initiate.   Arnulfo Larch, MD  [facility]

## 2024-02-21 NOTE — Progress Notes (Deleted)
 Critical result Documentation  02/17/2024 0600  Provider Notification  Provider Name/Title (S)  Dr. Arnulfo Larch  Date Provider Notified (S)  02/20/2024  Time Provider Notified (S)  0600  Method of Notification (S)  Page  Notification Reason (S)  Critical Result  Test performed and critical result (S)  Troponin 871  Date Critical Result Received (S)  02/15/2024  Time Critical Result Received (S)  0557  Provider response (S)  See new orders  Date of Provider Response (S)  02/11/2024  Time of Provider Response (S)  0601

## 2024-02-21 NOTE — Consult Note (Addendum)
 Cardiology Consultation   Patient ID: Stephanie West MRN: [id number]; DOB: Feb 20, 1941  Admit date: 02/18/2024 Date of Consult: 02/20/2024  PCP:  Omie Bickers, MD   [facility] Providers Cardiologist:  Armida Lander, MD        Patient Profile:   Stephanie West is a 83 y.o. female with a hx of HTN, palpitations, hyponatremia, CKD 3B who is being seen 01/25/2024 for the evaluation of preop clearance and elevated troponins at the request of Dr. Sunnie England.  History of Present Illness:   Stephanie West is an 83 yr old female who last saw Dr. Amanda Jungling 03/2022 with white coat HTN , multi drug side effects-LE edema on norvasc , hyponatremia on hydrochlorothiazide , fatigue on clonidine . Tolerated diltiazem  and olmesartan. Monitor showed SR and sinus tachycardia.  Patient admitted with a fall with right femur fracture. Notes say she had SVT in setting of dehydration, rates improve with IV metoprolol  2.5 mg x 1 and IV fluids.Troponins 35, 51, 879, EKG with a lot of artifact, nonspecific ST changes. Hgb 8.5 down from 10.5 on admission and 12.1 09/2023. Patient has dementia and answers "yes" to everything. Her daughter says she has been very weak, fragile, difficulty walking, refusing to eat and drink, forgets to take her meds. Has lost 27 lbs since December. She lives alone but they were looking at [facility] when she fell yest. No history of CAD or complaints of chest pain or dyspnea.    Past Medical History:  Diagnosis Date   Asthma    Dysphagia 05/10/2017   Elevated transaminase level    GERD (gastroesophageal reflux disease)    Hypertension    MVP (mitral valve prolapse)     Past Surgical History:  Procedure Laterality Date   COLONOSCOPY     ESOPHAGEAL DILATION  07/30/2015   Procedure: ESOPHAGEAL DILATION;  Surgeon: Ruby Corporal, MD;  Location: [facility];  Service: Endoscopy;;   ESOPHAGOGASTRODUODENOSCOPY N/A 04/15/2015   Procedure: ESOPHAGOGASTRODUODENOSCOPY (EGD);  Surgeon:  Ruby Corporal, MD;  Location: [facility];  Service: Endoscopy;  Laterality: N/A;  125 - moved to 1:00, Ann to notify pt   ESOPHAGOGASTRODUODENOSCOPY N/A 07/30/2015   Procedure: ESOPHAGOGASTRODUODENOSCOPY (EGD);  Surgeon: Ruby Corporal, MD;  Location: [facility];  Service: Endoscopy;  Laterality: N/A;  1040   ESOPHAGOGASTRODUODENOSCOPY (EGD) WITH ESOPHAGEAL DILATION N/A 03/07/2013   Procedure: ESOPHAGOGASTRODUODENOSCOPY (EGD) WITH ESOPHAGEAL DILATION;  Surgeon: Ruby Corporal, MD;  Location: [facility];  Service: Endoscopy;  Laterality: N/A;  830   EYE SURGERY     LAPAROSCOPIC TUBAL LIGATION  1984   UPPER GASTROINTESTINAL ENDOSCOPY       Home Medications:  Prior to Admission medications   Medication Sig Start Date End Date Taking? Authorizing Provider  acetaminophen  (TYLENOL ) 325 MG tablet Take 2 tablets (650 mg total) by mouth every 6 (six) hours as needed for mild pain (or Fever >/= 101). 02/11/21   Emokpae, Courage, MD  amLODipine  (NORVASC ) 5 MG tablet Take 1 tablet (5 mg total) by mouth daily. 12/01/21   Demaris Fillers, MD  cloNIDine  (CATAPRES  - DOSED IN MG/24 HR) 0.2 mg/24hr patch Place 1 patch (0.2 mg total) onto the skin once a week. Every Thursday 02/17/21   Colin Dawley, MD  donepezil (ARICEPT) 10 MG tablet Take 10 mg by mouth at bedtime. 08/16/22   [provider]  ezetimibe  (ZETIA ) 10 MG tablet Take 10 mg by mouth daily.    [provider]  feeding supplement (  ENSURE ENLIVE / ENSURE PLUS) LIQD Take 237 mLs by mouth 3 (three) times daily between meals. Patient taking differently: Take 237 mLs by mouth every other day. 02/11/21   Colin Dawley, MD  guanFACINE  (INTUNIV ) 1 MG TB24 ER tablet Take 1 mg by mouth daily. 04/01/20   [provider]  hydrALAZINE  (APRESOLINE ) 50 MG tablet TAKE 1 TABLET THREE TIMES A DAY 08/29/23   Laurann Pollock, MD  levocetirizine (XYZAL ) 5 MG tablet Take 5 mg by mouth at bedtime. 10/14/21   [provider]   loteprednol  (LOTEMAX ) 0.5 % ophthalmic suspension 1 drop daily. 03/17/22   [provider]  megestrol  (MEGACE ) 40 MG tablet Take 40 mg by mouth 2 (two) times daily. 04/11/22   [provider]  olmesartan (BENICAR) 40 MG tablet Take 40 mg by mouth daily.    [provider]  omeprazole  (PRILOSEC) 40 MG capsule TAKE 1 CAPSULE(40 MG) BY MOUTH DAILY 04/20/22   Carlan, Chelsea L, NP  polyethylene glycol powder (GLYCOLAX /MIRALAX ) 17 GM/SCOOP powder Take 1 Container by mouth as needed for mild constipation.    [provider]  potassium chloride  (KLOR-CON ) 10 MEQ tablet Take 10 mEq by mouth 3 (three) times daily. 02/27/22   [provider]  sertraline  (ZOLOFT ) 50 MG tablet Take 50 mg by mouth daily. 11/26/23   [provider]  sucralfate  (CARAFATE ) 1 g tablet Take 1 g by mouth 4 (four) times daily -  with meals and at bedtime.    [provider]    Inpatient Medications: Scheduled Meds:  Chlorhexidine Gluconate Cloth  6 each Topical Daily   cloNIDine   0.2 mg Transdermal Weekly   insulin aspart  0-6 Units Subcutaneous Q4H   Continuous Infusions:   ceFAZolin (ANCEF) IV     lactated ringers  75 mL/hr at 02/02/2024 0747   tranexamic acid     PRN Meds: acetaminophen  **OR** acetaminophen , HYDROmorphone (DILAUDID) injection, labetalol, metoprolol  tartrate, ondansetron  **OR** ondansetron  (ZOFRAN ) IV, senna-docusate  Allergies:    Allergies  Allergen Reactions   Apple Juice Other (See Comments)    Wheezing, Asthma Symptoms    Aspirin Other (See Comments)    Wheezing, Asthma Symptoms    Bee Venom Other (See Comments)    Wheezing, Asthma Symptoms   Benadryl  [Diphenhydramine  Hcl] Other (See Comments)    Wheezing, Asthma Symptoms    Chicken Allergy Other (See Comments)    Wheezing, Asthma Symptoms    Penicillins Other (See Comments)    Asthma Symptoms   Avelox [Moxifloxacin Hcl In Nacl] Hives    Pt can take cipro    Codeine Other (See  Comments)    Makes patient feel like she's going to pass out.    Factive [Gemifloxacin] Hives   Hctz [Hydrochlorothiazide ] Other (See Comments)    Hyponatremia    Iodine Other (See Comments)    Unknown    Levaquin [Levofloxacin In D5w] Hives    Pt can take cipro    Levofloxacin Hives   Penicillin V Other (See Comments)    Unknown    Tequin [Gatifloxacin] Hives   Iodinated Contrast Media Other (See Comments)    Pt was told by allergist to avoid dye due to other allergies   Sulfa Antibiotics Nausea Only    Social History:   Social History   Socioeconomic History   Marital status: Widowed    Spouse name: Not on file   Number of children: Not on file   Years of education: Not on file   Highest  education level: Not on file  Occupational History   Not on file  Tobacco Use   Smoking status: Never    Passive exposure: Never   Smokeless tobacco: Never  Vaping Use   Vaping status: Never Used  Substance and Sexual Activity   Alcohol use: No    Alcohol/week: 0.0 standard drinks of alcohol   Drug use: No   Sexual activity: Not on file  Other Topics Concern   Not on file  Social History Narrative   Not on file   Social Drivers of Health   Financial Resource Strain: Not on file  Food Insecurity: No Food Insecurity (02/18/2024)   Hunger Vital Sign    Worried About Running Out of Food in the Last Year: Never true    Ran Out of Food in the Last Year: Never true  Transportation Needs: No Transportation Needs (02/18/2024)   PRAPARE - Administrator, Civil Service (Medical): No    Lack of Transportation (Non-Medical): No  Physical Activity: Not on file  Stress: Not on file  Social Connections: Socially Isolated (02/18/2024)   Social Connection and Isolation Panel [NHANES]    Frequency of Communication with Friends and Family: More than three times a week    Frequency of Social Gatherings with Friends and Family: More than three times a week    Attends Religious  Services: Never    Database administrator or Organizations: No    Attends Banker Meetings: Never    Marital Status: Widowed  Intimate Partner Violence: Patient Unable To Answer (02/18/2024)   Humiliation, Afraid, Rape, and Kick questionnaire    Fear of Current or Ex-Partner: Patient unable to answer    Emotionally Abused: Patient unable to answer    Physically Abused: Patient unable to answer    Sexually Abused: Patient unable to answer    Family History:     Family History  Problem Relation Age of Onset   Hypertension Mother    Prostate cancer Father      ROS:  Please see the history of present illness.  Review of Systems  Reason unable to perform ROS: dementia.    All other ROS reviewed and negative.     Physical Exam/Data:   Vitals:   01/28/2024 0630 02/07/2024 0700 01/26/2024 0720 01/31/2024 0746  BP: (!) 160/85 (!) 163/51    Pulse: 81 79  82  Resp: 10 13  13   Temp:   98.3 F (36.8 C)   TempSrc:   Axillary   SpO2: 97% 95%  94%  Weight:      Height:        Intake/Output Summary (Last 24 hours) at 01/28/2024 0846 Last data filed at 02/17/2024 0747 Gross per 24 hour  Intake 1194.91 ml  Output --  Net 1194.91 ml      02/18/2024    7:21 PM 02/18/2024    2:00 PM 10/10/2023    5:15 PM  Last 3 Weights  Weight (lbs) 93 lb 0.6 oz 121 lb 4.1 oz 120 lb  Weight (kg) 42.2 kg 55 kg 54.432 kg     Body mass index is 16.48 kg/m.  General:  thin, elderly, in no acute distress  HEENT: normal Neck: no JVD Vascular: No carotid bruits; Distal pulses 2+ bilaterally Cardiac:  normal S1, S2; RRR; no murmur   Lungs:  clear to auscultation bilaterally, no wheezing, rhonchi or rales  Abd: soft, nontender, no hepatomegaly  Ext: no edema Musculoskeletal:  No  deformities, BUE and BLE strength normal and equal Skin: warm and dry  Neuro:  CNs 2-12 intact, no focal abnormalities noted Psych:  Normal affect   EKG:  The EKG was personally reviewed and demonstrates:  ? SVT vs  ST147/m yesterday, today NSR with a lot of artifact Telemetry:  Telemetry was personally reviewed and demonstrates:  NSR  Relevant CV Studies: Echo 2023  IMPRESSIONS     1. Left ventricular ejection fraction, by estimation, is 60 to 65%. The  left ventricle has normal function. The left ventricle has no regional  wall motion abnormalities. Left ventricular diastolic parameters are  consistent with Grade I diastolic  dysfunction (impaired relaxation).   2. Right ventricular systolic function is normal. The right ventricular  size is normal. There is normal pulmonary artery systolic pressure.   3. The mitral valve is degenerative. No evidence of mitral valve  regurgitation. No evidence of mitral stenosis.   4. The aortic valve is tricuspid. There is mild calcification of the  aortic valve. Aortic valve regurgitation is trivial. Aortic valve  sclerosis/calcification is present, without any evidence of aortic  stenosis.   5. The inferior vena cava is normal in size with greater than 50%  respiratory variability, suggesting right atrial pressure of 3 mmHg.    05/2020 event monitor 7 day event monitor Min HR 49, Avg HR 65, Max HR 119 Reported symptoms correlate with sinus rhythm and sinus tachycardia No significant arrhythmias   Laboratory Data:  High Sensitivity Troponin:   Recent Labs  Lab 02/18/24 1511 02/18/24 1658 01/28/2024 0221 02/02/2024 0748  TROPONINIHS 35* 51* 879* 925*     Chemistry Recent Labs  Lab 02/18/24 1511 02/18/24 1658 02/04/2024 0221  NA 138  --  139  K 4.1  --  4.2  CL 104  --  107  CO2 23  --  24  GLUCOSE 126*  --  113*  BUN 35*  --  32*  CREATININE 1.96*  --  1.68*  CALCIUM 9.2  --  8.3*  MG  --  1.7 1.7  GFRNONAA 25*  --  30*  ANIONGAP 11  --  8    Recent Labs  Lab 02/18/24 1511  PROT 7.3  ALBUMIN 3.8  AST 24  ALT 20  ALKPHOS 53  BILITOT 1.2   Lipids No results for input(s): "CHOL", "TRIG", "HDL", "LABVLDL", "LDLCALC", "CHOLHDL" in  the last 168 hours.  Hematology Recent Labs  Lab 02/18/24 1511 01/27/2024 0221  WBC 9.4 6.6  RBC 3.38* 2.73*  HGB 10.5* 8.5*  HCT 31.7* 26.8*  MCV 93.8 98.2  MCH 31.1 31.1  MCHC 33.1 31.7  RDW 14.6 14.8  PLT 137* 96*   Thyroid   Recent Labs  Lab 02/18/24 1658  TSH 2.120    BNPNo results for input(s): "BNP", "PROBNP" in the last 168 hours.  DDimer No results for input(s): "DDIMER" in the last 168 hours.   Radiology/Studies:  DG Elbow 2 Views Right Result Date: 02/18/2024 CLINICAL DATA:  Right elbow and right arm pain after a fall. Decreased range of motion. EXAM: RIGHT ELBOW - 2 VIEW; RIGHT HUMERUS - 2+ VIEW COMPARISON:  None Available. FINDINGS: Two views of the right humerus and two views of the right elbow are obtained. Degenerative changes demonstrated in the right elbow and right shoulder. No evidence of acute fracture or dislocation. No focal bone lesions or bone destruction. Bone cortex appears intact. Soft tissues are unremarkable. IMPRESSION: Degenerative changes in the right shoulder and  right elbow. No acute displaced fractures are identified. Electronically Signed   By: Boyce Byes M.D.   On: 02/18/2024 18:21   DG Humerus Right Result Date: 02/18/2024 CLINICAL DATA:  Right elbow and right arm pain after a fall. Decreased range of motion. EXAM: RIGHT ELBOW - 2 VIEW; RIGHT HUMERUS - 2+ VIEW COMPARISON:  None Available. FINDINGS: Two views of the right humerus and two views of the right elbow are obtained. Degenerative changes demonstrated in the right elbow and right shoulder. No evidence of acute fracture or dislocation. No focal bone lesions or bone destruction. Bone cortex appears intact. Soft tissues are unremarkable. IMPRESSION: Degenerative changes in the right shoulder and right elbow. No acute displaced fractures are identified. Electronically Signed   By: Boyce Byes M.D.   On: 02/18/2024 18:21   CT Hip Right Wo Contrast Result Date: 02/18/2024 CLINICAL  DATA:  Right hip pain after fall. EXAM: CT OF THE RIGHT HIP WITHOUT CONTRAST TECHNIQUE: Multidetector CT imaging of the right hip was performed according to the standard protocol. Multiplanar CT image reconstructions were also generated. RADIATION DOSE REDUCTION: This exam was performed according to the departmental dose-optimization program which includes automated exposure control, adjustment of the mA and/or kV according to patient size and/or use of iterative reconstruction technique. COMPARISON:  Right hip radiographs dated 02/18/2024 at 2:31 p.m. FINDINGS: Bones/Joint/Cartilage Acute impacted subcapital fracture of the right femoral head and neck junction. There is a approximately 6 mm of cortical offset at the posterior femoral head and neck junction. The right femoral head is seated within the acetabulum. Mild degenerative changes of the right hip. The right sacroiliac joint and pubic symphysis are intact with degenerative changes. Ligaments Ligaments are suboptimally evaluated by CT. Soft tissue/muscles Soft tissue swelling along the right posterolateral hip and buttocks. No loculated collection. Atherosclerotic vascular calcifications. IMPRESSION: Acute impacted subcapital fracture of the right femoral head and neck junction. There is a approximately 6 mm of cortical offset at the posterior femoral head and neck junction. Electronically Signed   By: Mannie Seek M.D.   On: 02/18/2024 16:05   DG Chest 1 View Result Date: 02/18/2024 CLINICAL DATA:  Fall. EXAM: CHEST  1 VIEW COMPARISON:  10/10/2023. FINDINGS: Bilateral lungs appear hyperlucent with coarse bronchovascular markings, in keeping with COPD. Bilateral lungs otherwise appear clear. No dense consolidation or lung collapse. Bilateral costophrenic angles are clear. Stable cardio-mediastinal silhouette. Small retrocardiac hiatal hernia noted. No acute osseous abnormalities. The soft tissues are within normal limits. IMPRESSION: No active disease.  Electronically Signed   By: Beula Brunswick M.D.   On: 02/18/2024 15:14   DG Hip Unilat W or Wo Pelvis 2-3 Views Right Result Date: 02/18/2024 CLINICAL DATA:  fall diffuse pain. EXAM: DG HIP (WITH OR WITHOUT PELVIS) 2-3V RIGHT COMPARISON:  None Available. FINDINGS: There is concern for acute impacted and undisplaced fracture of the subcapital right femur neck. Correlate clinically. Consider further imaging with CT scan, as clinically indicated. No other acute fracture or dislocation. No aggressive osseous lesion. Visualized sacral arcuate lines are unremarkable. Unremarkable symphysis pubis. There are mild degenerative changes of bilateral hip joints without significant joint space narrowing. Osteophytosis of the superior acetabulum. No radiopaque foreign bodies. IMPRESSION: *There is concern for acute impacted and undisplaced fracture of the subcapital right femur neck. Consider further imaging with CT scan, as clinically indicated. Electronically Signed   By: Beula Brunswick M.D.   On: 02/18/2024 15:04   DG Knee Complete 4 Views Right Result Date:  02/18/2024 CLINICAL DATA:  Pain after fall EXAM: RIGHT KNEE - COMPLETE 4 VIEW COMPARISON:  None Available. FINDINGS: Osteopenia. No acute fracture or dislocation. Mild joint space loss of the lateral compartment with some small osteophytes and some sclerosis. Trace joint effusion on lateral view. IMPRESSION: Osteopenia. Mild degenerative changes of the lateral compartment. Trace joint fluid Electronically Signed   By: Adrianna Horde M.D.   On: 02/18/2024 15:01     Assessment and Plan:   Elevated troponins  35,51,879 in setting of sinus tachycardia/SVT, dehydration, fall/hip fracture-no history of CAD, not able to obtain any history with dementia. No complaints of chest pain. Will order echo to evaluate LV function prior to clearance  Right femur fracture awaiting cardiac clearance for surgery.  HTN difficult to control in past-white coat  syndrome -significant LE edema on norvasc , hyponatremia on hydrochlorothiazide , fatigue on clonidine  -BP high and only on prn meds now. Resume olmesartan and hydralazine  once she can take oral  History of palpitations with NSR and Sinus tachycardia on monitor 2021.  Anemia with Hgb 8.5 down from 10.5 on admission and 12.1 in Dec.  CDK 3B  Failure to thrive with dementia, 27 lb weight loss since December, refusing to eat/drink-will need SNF    Risk Assessment/Risk Scores:     TIMI Risk Score for Unstable Angina or Non-ST Elevation MI:   The patient's TIMI risk score is 2, which indicates a 8% risk of all cause mortality, new or recurrent myocardial infarction or need for urgent revascularization in the next 14 days.          For questions or updates, please contact [facility] Please consult [email or web address] for contact info under    Signed, Theotis Flake, PA-C  02/16/2024 55:65 AM  83 year old here with right femur fracture.  She has been in significant decline over the last 5 years since her husband died.  She does live alone but she cannot bathe her self, she waits for her family to bring her food.  She needs full assistance.  She had expressed previously that she did not wish to go to a nursing home.  In speaking with her daughter, she does not wish to pursue surgical procedure.  I agree with full comfort measures.  Compassionate care.  Palliative care team.  Discussed with hospitalist team.  We will cancel echocardiogram.  She has had failure to thrive with dementia approximately 30 pound weight loss since December does not wish to eat or drink.  She is currently frail, sleeping in bed on her side.  Hemoglobin 8.5 troponin 925.  Troponin elevation is acute myocardial injury in the setting of her distress from hip fracture.  Not ACS.  This does portend a worsened prognosis.  Please let us  know if we can be of further assistance.  Stephanie Gathers, MD

## 2024-02-21 NOTE — Consult Note (Addendum)
 Reason for Consult: right hip fracture  Referring Physician: Lynann Sandman is an 83 y.o. female.  HPI: This is an 83 year old female with medical problems as listed below presented to the ER with a complaint of right hip pain secondary to fall.  See ER records for details but basically the patient was found to have a right hip fracture she was found by her family complaining of pain  She was a little disoriented  So further details are difficult to obtain.  Injury date appears to be February 18, 2024  Past Medical History:  Diagnosis Date   Asthma    Dysphagia 05/10/2017   Elevated transaminase level    GERD (gastroesophageal reflux disease)    Hypertension    MVP (mitral valve prolapse)     Past Surgical History:  Procedure Laterality Date   COLONOSCOPY     ESOPHAGEAL DILATION  07/30/2015   Procedure: ESOPHAGEAL DILATION;  Surgeon: Ruby Corporal, MD;  [facility];  Service: Endoscopy;;   ESOPHAGOGASTRODUODENOSCOPY N/A 04/15/2015   Procedure: ESOPHAGOGASTRODUODENOSCOPY (EGD);  Surgeon: Ruby Corporal, MD;  [facility];  Service: Endoscopy;  Laterality: N/A;  125 - moved to 1:00, Ann to notify pt   ESOPHAGOGASTRODUODENOSCOPY N/A 07/30/2015   Procedure: ESOPHAGOGASTRODUODENOSCOPY (EGD);  Surgeon: Ruby Corporal, MD;  [facility];  Service: Endoscopy;  Laterality: N/A;  1040   ESOPHAGOGASTRODUODENOSCOPY (EGD) WITH ESOPHAGEAL DILATION N/A 03/07/2013   Procedure: ESOPHAGOGASTRODUODENOSCOPY (EGD) WITH ESOPHAGEAL DILATION;  Surgeon: Ruby Corporal, MD;  [facility];  Service: Endoscopy;  Laterality: N/A;  830   EYE SURGERY     LAPAROSCOPIC TUBAL LIGATION  1984   UPPER GASTROINTESTINAL ENDOSCOPY      Family History  Problem Relation Age of Onset   Hypertension Mother    Prostate cancer Father     Social History:  reports that she has never smoked. She has never been exposed to tobacco smoke. She has never used  smokeless tobacco. She reports that she does not drink alcohol and does not use drugs.  Allergies:  Allergies  Allergen Reactions   Apple Juice Other (See Comments)    Wheezing, Asthma Symptoms    Aspirin Other (See Comments)    Wheezing, Asthma Symptoms    Bee Venom Other (See Comments)    Wheezing, Asthma Symptoms   Benadryl  [Diphenhydramine  Hcl] Other (See Comments)    Wheezing, Asthma Symptoms    Chicken Allergy Other (See Comments)    Wheezing, Asthma Symptoms    Penicillins Other (See Comments)    Asthma Symptoms   Avelox [Moxifloxacin Hcl In Nacl] Hives    Pt can take cipro    Codeine Other (See Comments)    Makes patient feel like she's going to pass out.    Factive [Gemifloxacin] Hives   Hctz [Hydrochlorothiazide ] Other (See Comments)    Hyponatremia    Iodine Other (See Comments)    Unknown    Levaquin [Levofloxacin In D5w] Hives    Pt can take cipro    Levofloxacin Hives   Penicillin V Other (See Comments)    Unknown    Tequin [Gatifloxacin] Hives   Iodinated Contrast Media Other (See Comments)    Pt was told by allergist to avoid dye due to other allergies   Sulfa Antibiotics Nausea Only    Medications: Prior to Admission:  Medications Prior to Admission  Medication Sig Dispense Refill Last Dose/Taking   acetaminophen  (TYLENOL ) 325 MG tablet Take  2 tablets (650 mg total) by mouth every 6 (six) hours as needed for mild pain (or Fever >/= 101). 12 tablet 0    amLODipine  (NORVASC ) 5 MG tablet Take 1 tablet (5 mg total) by mouth daily. 30 tablet 1    cloNIDine  (CATAPRES  - DOSED IN MG/24 HR) 0.2 mg/24hr patch Place 1 patch (0.2 mg total) onto the skin once a week. Every Thursday 4 patch 12    donepezil (ARICEPT) 10 MG tablet Take 10 mg by mouth at bedtime.      ezetimibe  (ZETIA ) 10 MG tablet Take 10 mg by mouth daily.      feeding supplement (ENSURE ENLIVE / ENSURE PLUS) LIQD Take 237 mLs by mouth 3 (three) times daily between meals. (Patient taking differently: Take  237 mLs by mouth every other day.) 237 mL 12    guanFACINE  (INTUNIV ) 1 MG TB24 ER tablet Take 1 mg by mouth daily.      hydrALAZINE  (APRESOLINE ) 50 MG tablet TAKE 1 TABLET THREE TIMES A DAY 90 tablet 0    levocetirizine (XYZAL ) 5 MG tablet Take 5 mg by mouth at bedtime.      loteprednol  (LOTEMAX ) 0.5 % ophthalmic suspension 1 drop daily.      megestrol  (MEGACE ) 40 MG tablet Take 40 mg by mouth 2 (two) times daily.      olmesartan (BENICAR) 40 MG tablet Take 40 mg by mouth daily.      omeprazole  (PRILOSEC) 40 MG capsule TAKE 1 CAPSULE(40 MG) BY MOUTH DAILY 90 capsule 3    polyethylene glycol powder (GLYCOLAX /MIRALAX ) 17 GM/SCOOP powder Take 1 Container by mouth as needed for mild constipation.      potassium chloride  (KLOR-CON ) 10 MEQ tablet Take 10 mEq by mouth 3 (three) times daily.      sertraline  (ZOLOFT ) 50 MG tablet Take 50 mg by mouth daily.      sucralfate  (CARAFATE ) 1 g tablet Take 1 g by mouth 4 (four) times daily -  with meals and at bedtime.       Results for orders placed or performed during the hospital encounter of 02/18/24 (from the past 48 hours)  Comprehensive metabolic panel     Status: Abnormal   Collection Time: 02/18/24  3:11 PM  Result Value Ref Range   Sodium 138 135 - 145 mmol/L   Potassium 4.1 3.5 - 5.1 mmol/L   Chloride 104 98 - 111 mmol/L   CO2 23 22 - 32 mmol/L   Glucose, Bld 126 (H) 70 - 99 mg/dL    Comment: Glucose reference range applies only to samples taken after fasting for at least 8 hours.   BUN 35 (H) 8 - 23 mg/dL   Creatinine, Ser 6.04 (H) 0.44 - 1.00 mg/dL   Calcium 9.2 8.9 - 54.0 mg/dL   Total Protein 7.3 6.5 - 8.1 g/dL   Albumin 3.8 3.5 - 5.0 g/dL   AST 24 15 - 41 U/L   ALT 20 0 - 44 U/L   Alkaline Phosphatase 53 38 - 126 U/L   Total Bilirubin 1.2 0.0 - 1.2 mg/dL   GFR, Estimated 25 (L) >60 mL/min    Comment: (NOTE) Calculated using the CKD-EPI Creatinine Equation (2021)    Anion gap 11 5 - 15    Comment: Performed at [facility],  [address] [place], [place] [address]  Troponin I (High Sensitivity)     Status: Abnormal   Collection Time: 02/18/24  3:11 PM  Result Value Ref Range  Troponin I (High Sensitivity) 35 (H) <18 ng/L    Comment: (NOTE) Elevated high sensitivity troponin I (hsTnI) values and significant  changes across serial measurements may suggest ACS but many other  chronic and acute conditions are known to elevate hsTnI results.  Refer to the "Links" section for chest pain algorithms and additional  guidance. Performed at [facility], [address] [place], [place] [address]   CBC with Differential     Status: Abnormal   Collection Time: 02/18/24  3:11 PM  Result Value Ref Range   WBC 9.4 4.0 - 10.5 K/uL   RBC 3.38 (L) 3.87 - 5.11 MIL/uL   Hemoglobin 10.5 (L) 12.0 - 15.0 g/dL   HCT 91.4 (L) 78.2 - 95.6 %   MCV 93.8 80.0 - 100.0 fL   MCH 31.1 26.0 - 34.0 pg   MCHC 33.1 30.0 - 36.0 g/dL   RDW 21.3 08.6 - 57.8 %   Platelets 137 (L) 150 - 400 K/uL    Comment: REPEATED TO VERIFY   nRBC 0.0 0.0 - 0.2 %   Neutrophils Relative % 85 %   Neutro Abs 8.1 (H) 1.7 - 7.7 K/uL   Lymphocytes Relative 9 %   Lymphs Abs 0.8 0.7 - 4.0 K/uL   Monocytes Relative 6 %   Monocytes Absolute 0.5 0.1 - 1.0 K/uL   Eosinophils Relative 0 %   Eosinophils Absolute 0.0 0.0 - 0.5 K/uL   Basophils Relative 0 %   Basophils Absolute 0.0 0.0 - 0.1 K/uL   Immature Granulocytes 0 %   Abs Immature Granulocytes 0.02 0.00 - 0.07 K/uL    Comment: Performed at [facility], [address] [place], [place] [address]  Hemoglobin A1c     Status: None   Collection Time: 02/18/24  3:11 PM  Result Value Ref Range   Hgb A1c MFr Bld 5.0 4.8 - 5.6 %    Comment: (NOTE) Pre diabetes:          5.7%-6.4%  Diabetes:              >6.4%  Glycemic control for   <7.0% adults with diabetes    Mean Plasma Glucose 96.8 mg/dL    Comment: Performed at [facility], [address], [place], [place] [address]  Troponin I (High Sensitivity)      Status: Abnormal   Collection Time: 02/18/24  4:58 PM  Result Value Ref Range   Troponin I (High Sensitivity) 51 (H) <18 ng/L    Comment: (NOTE) Elevated high sensitivity troponin I (hsTnI) values and significant  changes across serial measurements may suggest ACS but many other  chronic and acute conditions are known to elevate hsTnI results.  Refer to the "Links" section for chest pain algorithms and additional  guidance. Performed at [facility], [address], [place], [place] [address]   TSH     Status: None   Collection Time: 02/18/24  4:58 PM  Result Value Ref Range   TSH 2.120 0.350 - 4.500 uIU/mL    Comment: Performed by a 3rd Generation assay with a functional sensitivity of <=0.01 uIU/mL. Performed at [facility], [address], [place], [place] [address]   Magnesium      Status: None   Collection Time: 02/18/24  4:58 PM  Result Value Ref Range   Magnesium  1.7 1.7 - 2.4 mg/dL    Comment: Performed at [facility], [address], [place], [place] [address]  Phosphorus     Status: None  Collection Time: 02/18/24  4:58 PM  Result Value Ref Range   Phosphorus 2.9 2.5 - 4.6 mg/dL    Comment: Performed at [facility], [address] [place], [place] [address]  Vitamin B12     Status: None   Collection Time: 02/18/24  4:58 PM  Result Value Ref Range   Vitamin B-12 356 180 - 914 pg/mL    Comment: (NOTE) This assay is not validated for testing neonatal or myeloproliferative syndrome specimens for Vitamin B12 levels. Performed at [facility], [address] [place], [place] [address]   VITAMIN D 25 Hydroxy (Vit-D Deficiency, Fractures)     Status: None   Collection Time: 02/18/24  4:58 PM  Result Value Ref Range   Vit D, 25-Hydroxy 42.43 30 - 100 ng/mL    Comment: (NOTE) Vitamin D deficiency has been defined by the Institute of Medicine  and an Endocrine Society practice guideline as a level of serum 25-OH  vitamin D less than 20 ng/mL (1,2). The Endocrine Society  went on to  further define vitamin D insufficiency as a level between 21 and 29  ng/mL (2).  1. IOM (Institute of Medicine). 2010. Dietary reference intakes for  calcium and D. [place]: [employer]. 2. Holick MF, Binkley [place], Bischoff-Ferrari HA, et al. Evaluation,  treatment, and prevention of vitamin D deficiency: an Endocrine  Society clinical practice guideline, JCEM. 2011 Jul; 96(7): 1911-30.  Performed at [facility], [address] [place], [place] [address]   Iron and TIBC     Status: Abnormal   Collection Time: 02/18/24  4:58 PM  Result Value Ref Range   Iron 12 (L) 28 - 170 ug/dL   TIBC 166 063 - 016 ug/dL   Saturation Ratios 5 (L) 10.4 - 31.8 %   UIBC 239 ug/dL    Comment: Performed at [facility], [address] [place], [place] [address]  Ferritin     Status: None   Collection Time: 02/18/24  4:58 PM  Result Value Ref Range   Ferritin 67 11 - 307 ng/mL    Comment: Performed at [facility], [address] [place], [place] [address]  MRSA Next Gen by PCR, Nasal     Status: None   Collection Time: 02/18/24  5:51 PM   Specimen: Nasal Mucosa; Nasal Swab  Result Value Ref Range   MRSA by PCR Next Gen NOT DETECTED NOT DETECTED    Comment: (NOTE) The GeneXpert MRSA Assay (FDA approved for NASAL specimens only), is one component of a comprehensive MRSA colonization surveillance program. It is not intended to diagnose MRSA infection nor to guide or monitor treatment for MRSA infections. Test performance is not FDA approved in patients less than 61 years old. Performed at [facility], [address], [place], [place] [address]   Glucose, capillary     Status: Abnormal   Collection Time: 02/18/24  9:18 PM  Result Value Ref Range   Glucose-Capillary 121 (H) 70 - 99 mg/dL    Comment: Glucose reference range applies only to samples taken after fasting for at least 8 hours.  Glucose, capillary     Status: Abnormal   Collection Time: 02/18/24  11:56 PM  Result Value Ref Range   Glucose-Capillary 125 (H) 70 - 99 mg/dL    Comment: Glucose reference range applies only to samples taken after fasting for at least 8 hours.  Basic metabolic panel     Status: Abnormal   Collection Time:  01/24/2024  2:21 AM  Result Value Ref Range   Sodium 139 135 - 145 mmol/L   Potassium 4.2 3.5 - 5.1 mmol/L   Chloride 107 98 - 111 mmol/L   CO2 24 22 - 32 mmol/L   Glucose, Bld 113 (H) 70 - 99 mg/dL    Comment: Glucose reference range applies only to samples taken after fasting for at least 8 hours.   BUN 32 (H) 8 - 23 mg/dL   Creatinine, Ser 7.25 (H) 0.44 - 1.00 mg/dL   Calcium 8.3 (L) 8.9 - 10.3 mg/dL   GFR, Estimated 30 (L) >60 mL/min    Comment: (NOTE) Calculated using the CKD-EPI Creatinine Equation (2021)    Anion gap 8 5 - 15    Comment: Performed at [facility], [address], [place], [place] [address]  CBC     Status: Abnormal   Collection Time: 02/14/2024  2:21 AM  Result Value Ref Range   WBC 6.6 4.0 - 10.5 K/uL   RBC 2.73 (L) 3.87 - 5.11 MIL/uL   Hemoglobin 8.5 (L) 12.0 - 15.0 g/dL   HCT 03.4 (L) 74.2 - 59.5 %   MCV 98.2 80.0 - 100.0 fL   MCH 31.1 26.0 - 34.0 pg   MCHC 31.7 30.0 - 36.0 g/dL   RDW 63.8 75.6 - 43.3 %   Platelets 96 (L) 150 - 400 K/uL    Comment: Immature Platelet Fraction may be clinically indicated, consider ordering this additional test [id number]    nRBC 0.0 0.0 - 0.2 %    Comment: Performed at [facility], [address] [place], [place] [address]  Magnesium      Status: None   Collection Time: 01/24/2024  2:21 AM  Result Value Ref Range   Magnesium  1.7 1.7 - 2.4 mg/dL    Comment: Performed at [facility], [address] [place], [place] [address]  Phosphorus     Status: None   Collection Time: 02/10/2024  2:21 AM  Result Value Ref Range   Phosphorus 3.8 2.5 - 4.6 mg/dL    Comment: Performed at [facility], [address], [place], [place] [address]  Troponin I (High Sensitivity)     Status: Abnormal    Collection Time: 02/01/2024  2:21 AM  Result Value Ref Range   Troponin I (High Sensitivity) 879 (HH) <18 ng/L    Comment: CRITICAL RESULT CALLED TO, READ BACK BY AND VERIFIED WITH S. BHATTARAI [phone] [id number], VIRAY,J DELTA CHECK NOTED (NOTE) Elevated high sensitivity troponin I (hsTnI) values and significant  changes across serial measurements may suggest ACS but many other  chronic and acute conditions are known to elevate hsTnI results.  Refer to the "Links" section for chest pain algorithms and additional  guidance. Performed at [facility], [address] [place], [place] [address]   Glucose, capillary     Status: None   Collection Time: 02/08/2024  3:58 AM  Result Value Ref Range   Glucose-Capillary 91 70 - 99 mg/dL    Comment: Glucose reference range applies only to samples taken after fasting for at least 8 hours.    DG Elbow 2 Views Right Result Date: 02/18/2024 CLINICAL DATA:  Right elbow and right arm pain after a fall. Decreased range of motion. EXAM: RIGHT ELBOW - 2 VIEW; RIGHT HUMERUS - 2+ VIEW COMPARISON:  None Available. FINDINGS: Two views of the right humerus and two views of the right elbow are obtained. Degenerative changes demonstrated in the right elbow and right shoulder.  No evidence of acute fracture or dislocation. No focal bone lesions or bone destruction. Bone cortex appears intact. Soft tissues are unremarkable. IMPRESSION: Degenerative changes in the right shoulder and right elbow. No acute displaced fractures are identified. Electronically Signed   By: Boyce Byes M.D.   On: 02/18/2024 18:21   DG Humerus Right Result Date: 02/18/2024 CLINICAL DATA:  Right elbow and right arm pain after a fall. Decreased range of motion. EXAM: RIGHT ELBOW - 2 VIEW; RIGHT HUMERUS - 2+ VIEW COMPARISON:  None Available. FINDINGS: Two views of the right humerus and two views of the right elbow are obtained. Degenerative changes demonstrated in the right elbow and right shoulder. No  evidence of acute fracture or dislocation. No focal bone lesions or bone destruction. Bone cortex appears intact. Soft tissues are unremarkable. IMPRESSION: Degenerative changes in the right shoulder and right elbow. No acute displaced fractures are identified. Electronically Signed   By: Boyce Byes M.D.   On: 02/18/2024 18:21   CT Hip Right Wo Contrast Result Date: 02/18/2024 CLINICAL DATA:  Right hip pain after fall. EXAM: CT OF THE RIGHT HIP WITHOUT CONTRAST TECHNIQUE: Multidetector CT imaging of the right hip was performed according to the standard protocol. Multiplanar CT image reconstructions were also generated. RADIATION DOSE REDUCTION: This exam was performed according to the departmental dose-optimization program which includes automated exposure control, adjustment of the mA and/or kV according to patient size and/or use of iterative reconstruction technique. COMPARISON:  Right hip radiographs dated 02/18/2024 at 2:31 p.m. FINDINGS: Bones/Joint/Cartilage Acute impacted subcapital fracture of the right femoral head and neck junction. There is a approximately 6 mm of cortical offset at the posterior femoral head and neck junction. The right femoral head is seated within the acetabulum. Mild degenerative changes of the right hip. The right sacroiliac joint and pubic symphysis are intact with degenerative changes. Ligaments Ligaments are suboptimally evaluated by CT. Soft tissue/muscles Soft tissue swelling along the right posterolateral hip and buttocks. No loculated collection. Atherosclerotic vascular calcifications. IMPRESSION: Acute impacted subcapital fracture of the right femoral head and neck junction. There is a approximately 6 mm of cortical offset at the posterior femoral head and neck junction. Electronically Signed   By: Mannie Seek M.D.   On: 02/18/2024 16:05   DG Chest 1 View Result Date: 02/18/2024 CLINICAL DATA:  Fall. EXAM: CHEST  1 VIEW COMPARISON:  10/10/2023. FINDINGS:  Bilateral lungs appear hyperlucent with coarse bronchovascular markings, in keeping with COPD. Bilateral lungs otherwise appear clear. No dense consolidation or lung collapse. Bilateral costophrenic angles are clear. Stable cardio-mediastinal silhouette. Small retrocardiac hiatal hernia noted. No acute osseous abnormalities. The soft tissues are within normal limits. IMPRESSION: No active disease. Electronically Signed   By: Beula Brunswick M.D.   On: 02/18/2024 15:14   DG Hip Unilat W or Wo Pelvis 2-3 Views Right Result Date: 02/18/2024 CLINICAL DATA:  fall diffuse pain. EXAM: DG HIP (WITH OR WITHOUT PELVIS) 2-3V RIGHT COMPARISON:  None Available. FINDINGS: There is concern for acute impacted and undisplaced fracture of the subcapital right femur neck. Correlate clinically. Consider further imaging with CT scan, as clinically indicated. No other acute fracture or dislocation. No aggressive osseous lesion. Visualized sacral arcuate lines are unremarkable. Unremarkable symphysis pubis. There are mild degenerative changes of bilateral hip joints without significant joint space narrowing. Osteophytosis of the superior acetabulum. No radiopaque foreign bodies. IMPRESSION: *There is concern for acute impacted and undisplaced fracture of the subcapital right femur neck. Consider further imaging  with CT scan, as clinically indicated. Electronically Signed   By: Beula Brunswick M.D.   On: 02/18/2024 15:04   DG Knee Complete 4 Views Right Result Date: 02/18/2024 CLINICAL DATA:  Pain after fall EXAM: RIGHT KNEE - COMPLETE 4 VIEW COMPARISON:  None Available. FINDINGS: Osteopenia. No acute fracture or dislocation. Mild joint space loss of the lateral compartment with some small osteophytes and some sclerosis. Trace joint effusion on lateral view. IMPRESSION: Osteopenia. Mild degenerative changes of the lateral compartment. Trace joint fluid Electronically Signed   By: Adrianna Horde M.D.   On: 02/18/2024 15:01    Review of  Systems unobtainable at this time Blood pressure (!) 160/85, pulse 81, temperature 98.2 F (36.8 C), temperature source Axillary, resp. rate 10, height 5\' 3"  (1.6 m), weight 42.2 kg, SpO2 97%. Physical Exam Vitals and nursing note reviewed.  Constitutional:      General: She is not in acute distress.    Appearance: Normal appearance. She is normal weight. She is not ill-appearing, toxic-appearing or diaphoretic.  HENT:     Head: Normocephalic and atraumatic.     Right Ear: External ear normal.     Left Ear: External ear normal.     Nose: Nose normal. No congestion or rhinorrhea.     Mouth/Throat:     Mouth: Mucous membranes are moist.     Pharynx: No oropharyngeal exudate or posterior oropharyngeal erythema.  Eyes:     General: No scleral icterus.       Right eye: No discharge.        Left eye: No discharge.     Extraocular Movements: Extraocular movements intact.     Conjunctiva/sclera: Conjunctivae normal.     Pupils: Pupils are equal, round, and reactive to light.  Cardiovascular:     Rate and Rhythm: Regular rhythm. Tachycardia present.     Pulses: Normal pulses.     Heart sounds: Normal heart sounds.  Pulmonary:     Effort: Pulmonary effort is normal. No respiratory distress.     Breath sounds: Normal breath sounds. No stridor.  Chest:     Chest wall: No tenderness.  Abdominal:     General: Abdomen is flat. Bowel sounds are normal. There is no distension.     Palpations: Abdomen is soft. There is no mass.     Tenderness: There is no abdominal tenderness.  Musculoskeletal:     Cervical back: Normal range of motion and neck supple. No rigidity or tenderness.  Lymphadenopathy:     Cervical: No cervical adenopathy.  Skin:    General: Skin is warm and dry.     Capillary Refill: Capillary refill takes less than 2 seconds.  Neurological:     General: No focal deficit present.     Mental Status: She is alert and oriented to person, place, and time.     Cranial Nerves: No  cranial nerve deficit.     Sensory: No sensory deficit.     Motor: No weakness.     Gait: Gait abnormal.     Deep Tendon Reflexes: Reflexes normal.  Psychiatric:        Mood and Affect: Mood normal.        Behavior: Behavior normal.        Thought Content: Thought content normal.        Judgment: Judgment normal.   Right lower extremity in the characteristic position for hip fracture with tenderness noted to palpation over the right leg and proximal thigh  Her upper extremities show no evidence of contracture subluxation atrophy or tremor skin is normal  Left lower extremity is properly aligned skin is normal no atrophy contracture subluxation atrophy or tremor  Assessment/Plan: Right hip femoral neck fracture  Based on the patient's age of 68 and the potential for screw cut out I discussed with the patient's family option of performing a hemiarthroplasty versus a cannulated screw fixation  They agreed that it would be better to try to have 1 procedure as she may not tolerate a second procedure so we decided to go with a bipolar hip replacement on the right  Note  The patient is in the ICU right now pending cardiology consultation  Significant Event:    Notified of critical hs troponin 871, up from 51. previously had SVT and ischemic change with diffuse ST depressions. Suspect this is demand event in setting of her SVT and severe range hypertension. BP now much better controlled. Will repeat EKG, repeat troponin. Considering upcoming plan for OR and acute myocardial injury, will reach out to cardiology for consultation this morning. Hold off on aspirin / heparin  due to suspected demand event. If becomes more symptomatic or troponin does not plateau would initiate.    Arnulfo Larch, MD  [facility]   Elsa Halls 01/30/2024, 7:03 AM

## 2024-02-21 NOTE — Assessment & Plan Note (Signed)
 Renal function with serum cr at 1,68 with K at 4,2 and serum bicarbonate at 24 Na 139  Mg 1,7

## 2024-02-21 NOTE — Death Summary Note (Signed)
 DEATH SUMMARY   Patient Details  Name: Stephanie West MRN: [id number] DOB: 02/10/1941 Stephanie West, Stephanie Raveling, MD Admission/Discharge Information   Admit Date:  03/12/2024  Date of Death: Date of Death: 2024/03/13  Time of Death: Time of Death: 1850  Length of Stay: 1   Principle Cause of death: heart failure   Hospital Diagnoses: Principal Problem:   Subcapital fracture of femur, right, closed, initial encounter [facility]) Active Problems:   Hypertension   SVT (supraventricular tachycardia) (HCC)   CKD (chronic kidney disease) stage 4, GFR 15-29 ml/min (HCC)   Normocytic anemia   Hospital Course: Mrs. Digioia was admitted to the hospital with the working diagnosis of right hip femoral neck fracture.   83 yo female with the past medical history of CKD, GERD, dysphagia hypertension and heart failure who presented after a mechanical fall. Patient fell at home twice, she was found by her daughter in the sofa with right hip pain. Because of persistent hip pain EMS was called.  In the ED her blood pressure was 187/65, HR 114, RR 20 and 02 saturation 94% on room air. Lungs with no wheezing or rhonchi, heart with S1 and S2 present and tachycardic, abdomen with no distention and no lower extremity edema.   Na 138, K 4.1 Cl 104 bicarbonate 23, glucose 126 bun 35 cr 1,36  AST 24 ALT 20  High sensitive troponin 35, 51, 879 Wbc 9,4 hgb 10.5 plt 137   Chest radiograph with right rotation with hyperinflation with no effusions or infiltrates.   EKG 116 bpm, normal axis, normal intervals, atrial flutter/ SVT, with no significant ST segment or T wave changes.   Hip CT with acute impacted subcapital fracture of the right femoral head and neck junction. There is a approximately 6 mm of cortical offset at the posterior femoral head and neck junction   Decision was made to continue care under comfort measures.  Patient had progressive and worsening respiratory failure.  She was allowed to have natural  death under comfort measures.    Assessment and Plan: * Subcapital fracture of femur, right, closed, initial encounter [facility]) Patient not in acute pain, her health has been declining for the last 5 years, her dementia is progressing to the point where she needs assistance with her daily activities.  Poor quality of life Decision has made by her family to continue care with conservative measures, no invasive procedures.  Considering her advanced dementia this is reasonable option.  I have talked to Dr Richardean Chancellor and Dr Renna Cary, and plan to consult palliative care.   Acute on chronic diastolic CHF (congestive heart failure) (HCC) Positive elevation in high sensitive troponin Patient with poor prognosis in the setting of hip fracture and advanced dementia Per family request decision was made to continue under comfort care.  Echocardiogram was cancelled.   SVT (supraventricular tachycardia) (HCC) Telemetry with sinus rhythm at 80 bpm.  Patient was transitioned to comfort measures.    Hypertension Uncontrolled hypertension with hypertensive emergency on admission. She was on clonidine  patch.   Medications were discontinued and she was transitioned to comfort care.   CKD (chronic kidney disease) stage 4, GFR 15-29 ml/min (HCC) Renal function with serum cr at 1,68 with K at 4,2 and serum bicarbonate at 24 Na 139  Mg 1,7   Normocytic anemia Iron deficiency anemia with serum iron 12, TIBC 251, transferrin saturation 5 and ferritin 67  Hgb is 8,5          Consultations: Orthopedics,  Cardiology and Palliative Care   The results of significant diagnostics from this hospitalization (including imaging, microbiology, ancillary and laboratory) are listed below for reference.   Significant Diagnostic Studies: DG Elbow 2 Views Right Result Date: 02/18/2024 CLINICAL DATA:  Right elbow and right arm pain after a fall. Decreased range of motion. EXAM: RIGHT ELBOW - 2 VIEW; RIGHT HUMERUS - 2+  VIEW COMPARISON:  None Available. FINDINGS: Two views of the right humerus and two views of the right elbow are obtained. Degenerative changes demonstrated in the right elbow and right shoulder. No evidence of acute fracture or dislocation. No focal bone lesions or bone destruction. Bone cortex appears intact. Soft tissues are unremarkable. IMPRESSION: Degenerative changes in the right shoulder and right elbow. No acute displaced fractures are identified. Electronically Signed   By: Boyce Byes M.D.   On: 02/18/2024 18:21   DG Humerus Right Result Date: 02/18/2024 CLINICAL DATA:  Right elbow and right arm pain after a fall. Decreased range of motion. EXAM: RIGHT ELBOW - 2 VIEW; RIGHT HUMERUS - 2+ VIEW COMPARISON:  None Available. FINDINGS: Two views of the right humerus and two views of the right elbow are obtained. Degenerative changes demonstrated in the right elbow and right shoulder. No evidence of acute fracture or dislocation. No focal bone lesions or bone destruction. Bone cortex appears intact. Soft tissues are unremarkable. IMPRESSION: Degenerative changes in the right shoulder and right elbow. No acute displaced fractures are identified. Electronically Signed   By: Boyce Byes M.D.   On: 02/18/2024 18:21   CT Hip Right Wo Contrast Result Date: 02/18/2024 CLINICAL DATA:  Right hip pain after fall. EXAM: CT OF THE RIGHT HIP WITHOUT CONTRAST TECHNIQUE: Multidetector CT imaging of the right hip was performed according to the standard protocol. Multiplanar CT image reconstructions were also generated. RADIATION DOSE REDUCTION: This exam was performed according to the departmental dose-optimization program which includes automated exposure control, adjustment of the mA and/or kV according to patient size and/or use of iterative reconstruction technique. COMPARISON:  Right hip radiographs dated 02/18/2024 at 2:31 p.m. FINDINGS: Bones/Joint/Cartilage Acute impacted subcapital fracture of the right  femoral head and neck junction. There is a approximately 6 mm of cortical offset at the posterior femoral head and neck junction. The right femoral head is seated within the acetabulum. Mild degenerative changes of the right hip. The right sacroiliac joint and pubic symphysis are intact with degenerative changes. Ligaments Ligaments are suboptimally evaluated by CT. Soft tissue/muscles Soft tissue swelling along the right posterolateral hip and buttocks. No loculated collection. Atherosclerotic vascular calcifications. IMPRESSION: Acute impacted subcapital fracture of the right femoral head and neck junction. There is a approximately 6 mm of cortical offset at the posterior femoral head and neck junction. Electronically Signed   By: Mannie Seek M.D.   On: 02/18/2024 16:05   DG Chest 1 View Result Date: 02/18/2024 CLINICAL DATA:  Fall. EXAM: CHEST  1 VIEW COMPARISON:  10/10/2023. FINDINGS: Bilateral lungs appear hyperlucent with coarse bronchovascular markings, in keeping with COPD. Bilateral lungs otherwise appear clear. No dense consolidation or lung collapse. Bilateral costophrenic angles are clear. Stable cardio-mediastinal silhouette. Small retrocardiac hiatal hernia noted. No acute osseous abnormalities. The soft tissues are within normal limits. IMPRESSION: No active disease. Electronically Signed   By: Beula Brunswick M.D.   On: 02/18/2024 15:14   DG Hip Unilat W or Wo Pelvis 2-3 Views Right Result Date: 02/18/2024 CLINICAL DATA:  fall diffuse pain. EXAM: DG HIP (WITH OR WITHOUT  PELVIS) 2-3V RIGHT COMPARISON:  None Available. FINDINGS: There is concern for acute impacted and undisplaced fracture of the subcapital right femur neck. Correlate clinically. Consider further imaging with CT scan, as clinically indicated. No other acute fracture or dislocation. No aggressive osseous lesion. Visualized sacral arcuate lines are unremarkable. Unremarkable symphysis pubis. There are mild degenerative changes  of bilateral hip joints without significant joint space narrowing. Osteophytosis of the superior acetabulum. No radiopaque foreign bodies. IMPRESSION: *There is concern for acute impacted and undisplaced fracture of the subcapital right femur neck. Consider further imaging with CT scan, as clinically indicated. Electronically Signed   By: Beula Brunswick M.D.   On: 02/18/2024 15:04   DG Knee Complete 4 Views Right Result Date: 02/18/2024 CLINICAL DATA:  Pain after fall EXAM: RIGHT KNEE - COMPLETE 4 VIEW COMPARISON:  None Available. FINDINGS: Osteopenia. No acute fracture or dislocation. Mild joint space loss of the lateral compartment with some small osteophytes and some sclerosis. Trace joint effusion on lateral view. IMPRESSION: Osteopenia. Mild degenerative changes of the lateral compartment. Trace joint fluid Electronically Signed   By: Adrianna Horde M.D.   On: 02/18/2024 15:01    Microbiology: Recent Results (from the past 240 hours)  MRSA Next Gen by PCR, Nasal     Status: None   Collection Time: 02/18/24  5:51 PM   Specimen: Nasal Mucosa; Nasal Swab  Result Value Ref Range Status   MRSA by PCR Next Gen NOT DETECTED NOT DETECTED Final    Comment: (NOTE) The GeneXpert MRSA Assay (FDA approved for NASAL specimens only), is one component of a comprehensive MRSA colonization surveillance program. It is not intended to diagnose MRSA infection nor to guide or monitor treatment for MRSA infections. Test performance is not FDA approved in patients less than 39 years old. Performed at [facility], [address], [place], [place] [address]       Signed: Albertus Alt, MD

## 2024-02-21 NOTE — Assessment & Plan Note (Signed)
 Iron deficiency anemia with serum iron 12, TIBC 251, transferrin saturation 5 and ferritin 67  Hgb is 8,5  No current indication for PRBC transfusion.

## 2024-02-21 NOTE — Progress Notes (Signed)
 Critical result documentation   01/28/2024 0600  Provider Notification  Provider Name/Title (S)  Dr. Arnulfo Larch  Date Provider Notified (S)  01/29/2024  Time Provider Notified (S)  0600  Method of Notification (S)  Page  Notification Reason (S)  Critical Result  Test performed and critical result (S)  Troponin 879  Date Critical Result Received (S)  01/26/2024  Time Critical Result Received (S)  0557  Provider response (S)  See new orders  Date of Provider Response (S)  01/26/2024  Time of Provider Response (S)  0601

## 2024-02-21 NOTE — TOC CM/SW Note (Signed)
 Pt admitted from home with hip fracture and end stage dementia. Updated by Palliative APNP that family has chosen end of life comfort care for pt and in hospital death is anticipated.   TOC will follow and assist if there are any needs.

## 2024-02-21 NOTE — Hospital Course (Addendum)
 Mrs. Veth was admitted to the hospital with the working diagnosis of right hip femoral neck fracture.   83 yo female with the past medical history of CKD, GERD, dysphagia hypertension and heart failure who presented after a mechanical fall. Patient fell at home twice, she was found by her daughter in the sofa with right hip pain. Because of persistent hip pain EMS was called.  In the ED her blood pressure was 187/65, HR 114, RR 20 and 02 saturation 94% on room air. Lungs with no wheezing or rhonchi, heart with S1 and S2 present and tachycardic, abdomen with no distention and no lower extremity edema.   Na 138, K 4.1 Cl 104 bicarbonate 23, glucose 126 bun 35 cr 1,36  AST 24 ALT 20  High sensitive troponin 35, 51, 879 Wbc 9,4 hgb 10.5 plt 137   Chest radiograph with right rotation with hyperinflation with no effusions or infiltrates.   EKG 116 bpm, normal axis, normal intervals, atrial flutter/ SVT, with no significant ST segment or T wave changes.   Hip CT with acute impacted subcapital fracture of the right femoral head and neck junction. There is a approximately 6 mm of cortical offset at the posterior femoral head and neck junction   Decision was made to continue care under comfort measures.  Patient had progressive and worsening respiratory failure.  She was allowed to have natural death under comfort measures.

## 2024-02-21 NOTE — Assessment & Plan Note (Signed)
 Telemetry with sinus rhythm at 80 bpm.  Plan to continue close monitoring.  Will add oral metoprolol  for rate control.  Elevated troponin due to tachycardia, at this point ruled out acute coronary syndrome.

## 2024-02-21 NOTE — Consult Note (Signed)
 Consultation Note Date: 02/14/2024   Patient Name: Stephanie West  DOB: 03-18-1941  MRN: [id number]  Age / Sex: 83 y.o., female  PCP: Omie Bickers, MD Referring Physician: Albertus Alt  Reason for Consultation: Establishing goals of care  HPI/Patient Profile: 83 y.o. female  with past medical history of heart failure, dysphagia, failure to thrive with weight loss, HTN, palpitations, hyponatremia, CKD 3B  admitted on 02/18/2024 with fracture of right femur closed, uncontrolled hypertension.   Clinical Assessment and Goals of Care: I have reviewed medical records including EPIC notes, labs and imaging, received report from RN, assessed the patient.  Stephanie West is lying quietly in bed.  She appears acutely/chronically ill and frail.  She does not interact with me in any meaningful way.  She clearly cannot make her basic needs known.  Her daughter, Stephanie West, is present at bedside.  Face-to-face discussion with bedside nursing staff related to patient condition, needs, goals of care.  We meet at the bedside to discuss diagnosis prognosis, GOC, EOL wishes, disposition and options. I introduced Palliative Medicine as specialized medical care for people living with serious illness. It focuses on providing relief from the symptoms and stress of a serious illness. The goal is to improve quality of life for both the patient and the family.  We discussed a brief life review of the patient.  Stephanie West shares that her father died about 5 years ago and her mother has experienced decline since.  Stephanie West shares that Stephanie West has had weight loss and failure to thrive.  We then focused on their current illness.  We talked about Stephanie West's acute health concerns including, but not limited to, leg fracture, cardiac issues, failure to thrive, respiratory issues.  We talked about the concept of comfort care, focusing  on comfort and dignity to let nature take its course.  The natural disease trajectory and expectations at EOL were discussed.  I attempted to elicit values and goals of care important to the patient. The difference between aggressive medical intervention and comfort care was considered in light of the patient's goals of care.  Stephanie West shares that she is ready for comfort care.  She shares that her brother, Stephanie West, would be arriving from [place] soon.  She asks that we do delay comfort care waiting for family.  Advanced directives, concepts specific to code status, artifical feeding and hydration, and rehospitalization were considered and discussed.  DNR verified, comfort care only.  Hospice Care services outpatient were explained and offered.  Daughter Stephanie West asks appropriate questions about hospice.  In my opinion, at this point Stephanie West is too weak for transfer.  Prognosis discussed with permission.  I shared that hours today's would not be surprising.  Family is coming, but Stephanie West requests we do not wait for them to focus on comfort.  Discussed the importance of continued conversation with family and the medical providers regarding overall plan of care and treatment options, ensuring decisions are within the context of the patient's values and GOCs.  Questions and concerns were addressed.  The family was encouraged to call with questions or concerns.  PMT will continue to support holistically.  Conference with attending, bedside nursing staff, transition of care team related to patient condition, needs, goals of care, disposition.   HCPOA  NEXT OF KIN -daughter, Stephanie West.    SUMMARY OF RECOMMENDATIONS   Full comfort care End-of-life order set implemented Anticipate in-hospital death within the next 24 hours Too unstable for transport    Code Status/Advance Care Planning: DNR -comfort care  Symptom Management:  End-of-life order set implemented  Palliative Prophylaxis:  Frequent  Pain Assessment, Oral Care, and Turn Reposition  Additional Recommendations (Limitations, Scope, Preferences): Full Comfort Care  Psycho-social/Spiritual:  Desire for further Chaplaincy support:no Additional Recommendations: Caregiving  Support/Resources and Grief/Bereavement Support  Prognosis:  Hours - Days  Discharge Planning: Anticipated Hospital Death      Primary Diagnoses: Present on Admission:  Subcapital fracture of femur, right, closed, initial encounter (HCC)  Hypertensive urgency   I have reviewed the medical record, interviewed the patient and family, and examined the patient. The following aspects are pertinent.  Past Medical History:  Diagnosis Date   Asthma    Dysphagia 05/10/2017   Elevated transaminase level    GERD (gastroesophageal reflux disease)    Hypertension    MVP (mitral valve prolapse)    Social History   Socioeconomic History   Marital status: Widowed    Spouse name: Not on file   Number of children: Not on file   Years of education: Not on file   Highest education level: Not on file  Occupational History   Not on file  Tobacco Use   Smoking status: Never    Passive exposure: Never   Smokeless tobacco: Never  Vaping Use   Vaping status: Never Used  Substance and Sexual Activity   Alcohol use: No    Alcohol/week: 0.0 standard drinks of alcohol   Drug use: No   Sexual activity: Not on file  Other Topics Concern   Not on file  Social History Narrative   Not on file   Social Drivers of Health   Financial Resource Strain: Not on file  Food Insecurity: No Food Insecurity (02/18/2024)   Hunger Vital Sign    Worried About Running Out of Food in the Last Year: Never true    Ran Out of Food in the Last Year: Never true  Transportation Needs: No Transportation Needs (02/18/2024)   PRAPARE - Administrator, Civil Service (Medical): No    Lack of Transportation (Non-Medical): No  Physical Activity: Not on file  Stress: Not  on file  Social Connections: Socially Isolated (02/18/2024)   Social Connection and Isolation Panel [NHANES]    Frequency of Communication with Friends and Family: More than three times a week    Frequency of Social Gatherings with Friends and Family: More than three times a week    Attends Religious Services: Never    Database administrator or Organizations: No    Attends Banker Meetings: Never    Marital Status: Widowed   Family History  Problem Relation Age of Onset   Hypertension Mother    Prostate cancer Father    Scheduled Meds:  Chlorhexidine Gluconate Cloth  6 each Topical Daily   cloNIDine   0.2 mg Transdermal Weekly   insulin aspart  0-6 Units Subcutaneous Q4H   Continuous Infusions:   ceFAZolin (ANCEF) IV Stopped (02/06/2024 0855)  lactated ringers  75 mL/hr at 01/22/2024 0747   tranexamic acid Stopped (02/01/2024 0855)   PRN Meds:.acetaminophen  **OR** acetaminophen , HYDROmorphone (DILAUDID) injection, labetalol, metoprolol  tartrate, ondansetron  **OR** ondansetron  (ZOFRAN ) IV, senna-docusate Medications Prior to Admission:  Prior to Admission medications   Medication Sig Start Date End Date Taking? Authorizing Provider  acetaminophen  (TYLENOL ) 325 MG tablet Take 2 tablets (650 mg total) by mouth every 6 (six) hours as needed for mild pain (or Fever >/= 101). 02/11/21   Emokpae, Courage, MD  amLODipine  (NORVASC ) 5 MG tablet Take 1 tablet (5 mg total) by mouth daily. 12/01/21   Demaris Fillers, MD  cloNIDine  (CATAPRES  - DOSED IN MG/24 HR) 0.2 mg/24hr patch Place 1 patch (0.2 mg total) onto the skin once a week. Every Thursday 02/17/21   Colin Dawley, MD  donepezil (ARICEPT) 10 MG tablet Take 10 mg by mouth at bedtime. 08/16/22   [provider]  ezetimibe  (ZETIA ) 10 MG tablet Take 10 mg by mouth daily.    [provider]  feeding supplement (ENSURE ENLIVE / ENSURE PLUS) LIQD Take 237 mLs by mouth 3 (three) times daily between meals. Patient taking  differently: Take 237 mLs by mouth every other day. 02/11/21   Colin Dawley, MD  guanFACINE  (INTUNIV ) 1 MG TB24 ER tablet Take 1 mg by mouth daily. 04/01/20   [provider]  hydrALAZINE  (APRESOLINE ) 50 MG tablet TAKE 1 TABLET THREE TIMES A DAY 08/29/23   Laurann Pollock, MD  levocetirizine (XYZAL ) 5 MG tablet Take 5 mg by mouth at bedtime. 10/14/21   [provider]  loteprednol  (LOTEMAX ) 0.5 % ophthalmic suspension 1 drop daily. 03/17/22   [provider]  megestrol  (MEGACE ) 40 MG tablet Take 40 mg by mouth 2 (two) times daily. 04/11/22   [provider]  olmesartan (BENICAR) 40 MG tablet Take 40 mg by mouth daily.    [provider]  omeprazole  (PRILOSEC) 40 MG capsule TAKE 1 CAPSULE(40 MG) BY MOUTH DAILY 04/20/22   Carlan, Chelsea L, NP  polyethylene glycol powder (GLYCOLAX /MIRALAX ) 17 GM/SCOOP powder Take 1 Container by mouth as needed for mild constipation.    [provider]  potassium chloride  (KLOR-CON ) 10 MEQ tablet Take 10 mEq by mouth 3 (three) times daily. 02/27/22   [provider]  sertraline  (ZOLOFT ) 50 MG tablet Take 50 mg by mouth daily. 11/26/23   [provider]  sucralfate  (CARAFATE ) 1 g tablet Take 1 g by mouth 4 (four) times daily -  with meals and at bedtime.    [provider]   Allergies  Allergen Reactions   Apple Juice Other (See Comments)    Wheezing, Asthma Symptoms    Aspirin Other (See Comments)    Wheezing, Asthma Symptoms    Bee Venom Other (See Comments)    Wheezing, Asthma Symptoms   Benadryl  [Diphenhydramine  Hcl] Other (See Comments)    Wheezing, Asthma Symptoms    Chicken Allergy Other (See Comments)    Wheezing, Asthma Symptoms    Penicillins Other (See Comments)    Asthma Symptoms   Avelox [Moxifloxacin Hcl In Nacl] Hives    Pt can take cipro    Codeine Other (See Comments)    Makes patient feel like she's going to pass out.    Factive [Gemifloxacin] Hives   Hctz  [Hydrochlorothiazide ] Other (See Comments)    Hyponatremia    Iodine Other (See Comments)    Unknown    Levaquin [Levofloxacin In D5w] Hives    Pt can take  cipro    Levofloxacin Hives   Penicillin V Other (See Comments)    Unknown    Tequin [Gatifloxacin] Hives   Iodinated Contrast Media Other (See Comments)    Pt was told by allergist to avoid dye due to other allergies   Sulfa Antibiotics Nausea Only   Review of Systems  Unable to perform ROS: Acuity of condition    Physical Exam Vitals and nursing note reviewed.     Vital Signs: BP (!) 168/68 (BP Location: Left Arm)   Pulse 85   Temp 98.3 F (36.8 C) (Axillary)   Resp 14   Ht 5\' 3"  (1.6 m)   Wt 42.2 kg   SpO2 96%   BMI 16.48 kg/m  Pain Scale: PAINAD   Pain Score: Asleep   SpO2: SpO2: 96 % O2 Device:SpO2: 96 % O2 Flow Rate: .O2 Flow Rate (L/min): 5 L/min  IO: Intake/output summary:  Intake/Output Summary (Last 24 hours) at 02/08/2024 1012 Last data filed at 02/20/2024 0747 Gross per 24 hour  Intake 1194.91 ml  Output --  Net 1194.91 ml    LBM: Last BM Date :  (patient not able to answer) Baseline Weight: Weight: 55 kg Most recent weight: Weight: 42.2 kg     Palliative Assessment/Data:     Time In: 0940   Time Out: 1055 Time Total: 75 minutes  Greater than 50%  of this time was spent counseling and coordinating care related to the above assessment and plan.  Signed by: Annabelle Barrack, NP   Please contact Palliative Medicine Team phone at [phone] for questions and concerns.  For individual provider: See Tilford Foley

## 2024-02-21 NOTE — Assessment & Plan Note (Signed)
 Patient not in acute pain, her health has been declining for the last 5 years, her dementia is progressing to the point where she needs assistance with her daily activities.  Poor quality of life Decision has made by her family to continue care with conservative measures, no invasive procedures.  Considering her advanced dementia this is reasonable option.  I have talked to Dr Richardean Chancellor and Dr Renna Cary, and plan to consult palliative care.

## 2024-02-21 NOTE — Assessment & Plan Note (Signed)
 Positive elevation in high sensitive troponin Patient with poor prognosis in the setting of hip fracture and advanced dementia Per family request decision was made to continue under comfort care.  Echocardiogram was cancelled.

## 2024-02-21 NOTE — Assessment & Plan Note (Signed)
 Uncontrolled hypertension with hypertensive emergency on admission. She was on clonidine  patch.   Medications were discontinued and she was transitioned to comfort care.

## 2024-02-21 NOTE — Progress Notes (Signed)
 Patient ID: Stephanie West, female   DOB: 13-Jun-1941, 83 y.o.   MRN: [id number]  After consultation with the family and cardiology consultation and consultation with the medical service the family has declined surgery understanding that the patient will not survive without surgery  Surgery canceled

## 2024-02-21 DEATH — deceased
